# Patient Record
Sex: Female | Born: 1960 | Race: White | Hispanic: No | Smoking: Never smoker
Health system: Southern US, Community
[De-identification: ages and names within clinical notes are randomized; demographics above are authoritative.]

## PROBLEM LIST (undated history)

## (undated) DIAGNOSIS — D689 Coagulation defect, unspecified: Secondary | ICD-10-CM

## (undated) DIAGNOSIS — I82409 Acute embolism and thrombosis of unspecified deep veins of unspecified lower extremity: Secondary | ICD-10-CM

## (undated) DIAGNOSIS — E785 Hyperlipidemia, unspecified: Secondary | ICD-10-CM

## (undated) DIAGNOSIS — I2699 Other pulmonary embolism without acute cor pulmonale: Secondary | ICD-10-CM

## (undated) DIAGNOSIS — K8020 Calculus of gallbladder without cholecystitis without obstruction: Secondary | ICD-10-CM

## (undated) DIAGNOSIS — R569 Unspecified convulsions: Secondary | ICD-10-CM

## (undated) DIAGNOSIS — F32A Depression, unspecified: Secondary | ICD-10-CM

## (undated) DIAGNOSIS — I2510 Atherosclerotic heart disease of native coronary artery without angina pectoris: Secondary | ICD-10-CM

## (undated) DIAGNOSIS — I10 Essential (primary) hypertension: Secondary | ICD-10-CM

## (undated) DIAGNOSIS — T7840XA Allergy, unspecified, initial encounter: Secondary | ICD-10-CM

## (undated) DIAGNOSIS — O2230 Deep phlebothrombosis in pregnancy, unspecified trimester: Secondary | ICD-10-CM

## (undated) DIAGNOSIS — F419 Anxiety disorder, unspecified: Secondary | ICD-10-CM

## (undated) HISTORY — DX: Coagulation defect, unspecified: D68.9

## (undated) HISTORY — DX: Unspecified convulsions: R56.9

## (undated) HISTORY — DX: Allergy, unspecified, initial encounter: T78.40XA

## (undated) HISTORY — DX: Hyperlipidemia, unspecified: E78.5

## (undated) HISTORY — DX: Deep phlebothrombosis in pregnancy, unspecified trimester: O22.30

---

## 1997-12-10 ENCOUNTER — Other Ambulatory Visit: Admission: RE | Admit: 1997-12-10 | Discharge: 1997-12-10 | Payer: Self-pay | Admitting: *Deleted

## 1999-01-01 ENCOUNTER — Other Ambulatory Visit: Admission: RE | Admit: 1999-01-01 | Discharge: 1999-01-01 | Payer: Self-pay | Admitting: *Deleted

## 2001-04-03 ENCOUNTER — Other Ambulatory Visit: Admission: RE | Admit: 2001-04-03 | Discharge: 2001-04-03 | Payer: Self-pay | Admitting: *Deleted

## 2001-05-25 ENCOUNTER — Other Ambulatory Visit: Admission: RE | Admit: 2001-05-25 | Discharge: 2001-05-25 | Payer: Self-pay | Admitting: *Deleted

## 2002-07-18 ENCOUNTER — Ambulatory Visit (HOSPITAL_COMMUNITY): Admission: RE | Admit: 2002-07-18 | Discharge: 2002-07-18 | Payer: Self-pay | Admitting: Neurology

## 2003-04-01 ENCOUNTER — Other Ambulatory Visit: Admission: RE | Admit: 2003-04-01 | Discharge: 2003-04-01 | Payer: Self-pay | Admitting: Obstetrics and Gynecology

## 2003-05-06 ENCOUNTER — Emergency Department (HOSPITAL_COMMUNITY): Admission: EM | Admit: 2003-05-06 | Discharge: 2003-05-07 | Payer: Self-pay | Admitting: Emergency Medicine

## 2004-12-29 ENCOUNTER — Emergency Department (HOSPITAL_COMMUNITY): Admission: EM | Admit: 2004-12-29 | Discharge: 2004-12-29 | Payer: Self-pay | Admitting: Emergency Medicine

## 2005-03-24 ENCOUNTER — Other Ambulatory Visit: Admission: RE | Admit: 2005-03-24 | Discharge: 2005-03-24 | Payer: Self-pay | Admitting: Obstetrics and Gynecology

## 2007-10-06 ENCOUNTER — Ambulatory Visit (HOSPITAL_COMMUNITY): Admission: RE | Admit: 2007-10-06 | Discharge: 2007-10-06 | Payer: Self-pay | Admitting: *Deleted

## 2009-04-07 ENCOUNTER — Ambulatory Visit (HOSPITAL_COMMUNITY): Admission: RE | Admit: 2009-04-07 | Discharge: 2009-04-07 | Payer: Self-pay | Admitting: Family Medicine

## 2009-05-26 ENCOUNTER — Encounter: Admission: RE | Admit: 2009-05-26 | Discharge: 2009-05-26 | Payer: Self-pay | Admitting: Obstetrics and Gynecology

## 2012-10-20 ENCOUNTER — Other Ambulatory Visit (HOSPITAL_COMMUNITY): Payer: Self-pay | Admitting: Family Medicine

## 2012-10-20 ENCOUNTER — Ambulatory Visit (HOSPITAL_COMMUNITY)
Admission: RE | Admit: 2012-10-20 | Discharge: 2012-10-20 | Disposition: A | Discharge status: Home / Self Care | Payer: BC Managed Care – PPO | Source: Ambulatory Visit | Attending: Family Medicine | Admitting: Family Medicine

## 2012-10-20 DIAGNOSIS — R1032 Left lower quadrant pain: Secondary | ICD-10-CM | POA: Insufficient documentation

## 2012-10-20 DIAGNOSIS — R933 Abnormal findings on diagnostic imaging of other parts of digestive tract: Secondary | ICD-10-CM | POA: Insufficient documentation

## 2012-10-20 DIAGNOSIS — K8020 Calculus of gallbladder without cholecystitis without obstruction: Secondary | ICD-10-CM | POA: Insufficient documentation

## 2012-10-20 MED ORDER — IOHEXOL 300 MG/ML  SOLN
100.0000 mL | Freq: Once | INTRAMUSCULAR | Status: AC | PRN
  Administered 2012-10-20: 100 mL via INTRAVENOUS

## 2012-10-20 MED ORDER — IOHEXOL 300 MG/ML  SOLN: 50.0000 mL | Freq: Once | INTRAMUSCULAR | Status: AC | PRN

## 2012-11-06 ENCOUNTER — Ambulatory Visit (INDEPENDENT_AMBULATORY_CARE_PROVIDER_SITE_OTHER): Payer: BC Managed Care – PPO | Admitting: Surgery

## 2012-11-06 ENCOUNTER — Encounter (INDEPENDENT_AMBULATORY_CARE_PROVIDER_SITE_OTHER): Payer: Self-pay | Admitting: Surgery

## 2012-11-06 VITALS — BP 134/86 | HR 83 | Temp 97.4°F | Resp 16 | Ht 64.25 in | Wt 179.8 lb

## 2012-11-06 DIAGNOSIS — K8020 Calculus of gallbladder without cholecystitis without obstruction: Secondary | ICD-10-CM

## 2012-11-06 NOTE — Progress Notes (Signed)
Patient ID: Sarah Chung, female   DOB: 1961/07/08, 52 y.o.   MRN: [id number]  Chief Complaint  Patient presents with  . [place]    eval gallbladder    HPI Sarah Chung is a 52 y.o. female.  Patient sent at the request of Dr. Phillips Odor for left lower quadrant pain. One month ago she had a colonoscopy. No polypectomy was done. CT was done 2 weeks later do to his left lower quadrant pain. Infarcted epiploica was noted with incidental finding of gallstones. Denies any significant right upper quadrant pain nausea or vomiting. She does have some bloating and intermittent dyspepsia. No significant food intolerance. HPI  Past Medical History  Diagnosis Date  . DVT (deep vein thrombosis) in pregnancy   . Seizures     History reviewed. No pertinent past surgical history.  Family History  Problem Relation Age of Onset  . Heart attack Father   . Heart disease Brother     Social History History  Substance Use Topics  . Smoking status: Not on file  . Smokeless tobacco: Not on file  . Alcohol Use: No    Allergies  Allergen Reactions  . Tegretol (Carbamazepine) Hives  . Keppra (Levetiracetam)     Feels outside her body.[name]    Current Outpatient Prescriptions  Medication Sig Dispense Refill  . PHENobarbital (LUMINAL) 64.8 MG tablet       . omeprazole (PRILOSEC) 40 MG capsule        No current facility-administered medications for this visit.    Review of Systems Review of Systems  Constitutional: Negative.   HENT: Negative.   Eyes: Negative.   Respiratory: Negative.   Gastrointestinal: Positive for abdominal pain and abdominal distention.  Endocrine: Negative.   Genitourinary: Negative.   Musculoskeletal: Negative.   Skin: Negative.   Allergic/Immunologic: Negative.   Neurological: Negative.   Hematological: Negative.     Blood pressure 134/86, pulse 83, temperature 97.4 F (36.3 C), temperature source Temporal, resp. rate 16, height 5' 4.25" (1.632 m), weight  179 lb 12.8 oz (81.557 kg), SpO2 98.00%.  Physical Exam Physical Exam  Constitutional: She is oriented to person, place, and time. She appears well-developed and well-nourished.  HENT:  Head: Normocephalic and atraumatic.  Eyes: EOM are normal. Pupils are equal, round, and reactive to light.  Neck: Normal range of motion. Neck supple.  Cardiovascular: Normal rate and regular rhythm.   Pulmonary/Chest: Effort normal and breath sounds normal.  Abdominal: Bowel sounds are normal. She exhibits no distension. There is no tenderness.  Musculoskeletal: Normal range of motion.  Neurological: She is alert and oriented to person, place, and time.  Skin: Skin is warm and dry.  Psychiatric: She has a normal mood and affect. Her behavior is normal. Judgment and thought content normal.    Data Reviewed Clinical Data: Lower quadrant pain. Status post colonoscopy 2  weeks ago. Question perforation.  CT ABDOMEN AND PELVIS WITH CONTRAST  Technique: Multidetector CT imaging of the abdomen and pelvis was  performed following the standard protocol during bolus  administration of intravenous contrast.  Contrast: OMNIPAQUE IOHEXOL 300 MG/ML SOLN  Comparison: None.  Findings: Mild atelectasis is present in the lung bases. There is  no pleural or pericardial effusion.  There is no free intraperitoneal air to suggest bowel perforation.  The gallbladder is filled with high attenuation material most  consistent with contrast material from prior IV administration of  contrast. There are some stones in the gallbladder but no  evidence  of cholecystitis is seen. The liver, spleen, adrenal glands,  pancreas and kidneys appear normal.  The stomach and small bowel appear normal. Infiltration of fat is  seen in the about the distal descending colon without diverticular  disease identified most consistent with appendagitis epiploica.  There is no abscess. The colon is otherwise unremarkable. No   lymphadenopathy or fluid is identified. No focal bony abnormality  is seen.  IMPRESSION:  1. Focal inflammation anterior to the distal descending colon most  consistent with appendagitis epiploica.  2. Gallstones without evidence of cholecystitis.   Assessment    Left lower quadrant pain found to be consistent with probable infarcted epiploica descending colon with resolution of pain  Gallstones without definite signs of colic    Plan    Observation for now. Indications for cholecystectomy include pain with evidence of gallstones. Her symptoms are very minimal at the least at this point in time and observation is recommended unless she becomes more symptomatic. This information about gallstones given to patient.       [name]. 11/06/2012, 3:14 PM

## 2012-11-06 NOTE — Patient Instructions (Signed)
Cholelithiasis Cholelithiasis (also called gallstones) is a form of gallbladder disease where gallstones form in your gallbladder. The gallbladder is a non-essential organ that stores bile made in the liver, which helps digest fats. Gallstones begin as small crystals and slowly grow into stones. Gallstone pain occurs when the gallbladder spasms, and a gallstone is blocking the duct. Pain can also occur when a stone passes out of the duct.  Women are more likely to develop gallstones than men. Other factors that increase the risk of gallbladder disease are:  Having multiple pregnancies. Physicians sometimes advise removing diseased gallbladders before future pregnancies.  Obesity.  Diets heavy in fried foods and fat.  Increasing age (older than 60).  Prolonged use of medications containing female hormones.  Diabetes mellitus.  Rapid weight loss.  Family history of gallstones (heredity). SYMPTOMS  Feeling sick to your stomach (nauseous).  Abdominal pain.  Yellowing of the skin (jaundice).  Sudden pain. It may persist from several minutes to several hours.  Worsening pain with deep breathing or when jarred.  Fever.  Tenderness to the touch. In some cases, when gallstones do not move into the bile duct, people have no pain or symptoms. These are called "silent" gallstones. TREATMENT In severe cases, emergency surgery may be required. HOME CARE INSTRUCTIONS   Only take over-the-counter or prescription medicines for pain, discomfort, or fever as directed by your caregiver.  Follow a low-fat diet until seen again. Fat causes the gallbladder to contract, which can result in pain.  Follow up as instructed. Attacks are almost always recurrent and surgery is usually required for permanent treatment. SEEK IMMEDIATE MEDICAL CARE IF:   Your pain increases and is not controlled by medications.  You have an oral temperature above 102 F (38.9 C), not controlled by medication.  You  develop nausea and vomiting. MAKE SURE YOU:   Understand these instructions.  Will watch your condition.  Will get help right away if you are not doing well or get worse. Document Released: 07/22/2005 Document Revised: 10/18/2011 Document Reviewed: 09/24/2010 [employer] Patient Information 2013 [employer].  

## 2012-12-20 ENCOUNTER — Ambulatory Visit (INDEPENDENT_AMBULATORY_CARE_PROVIDER_SITE_OTHER): Payer: BC Managed Care – PPO | Admitting: Nurse Practitioner

## 2012-12-20 ENCOUNTER — Encounter: Payer: Self-pay | Admitting: Nurse Practitioner

## 2012-12-20 VITALS — BP 132/79 | HR 56 | Ht 64.0 in | Wt 178.0 lb

## 2012-12-20 DIAGNOSIS — Z5181 Encounter for therapeutic drug level monitoring: Secondary | ICD-10-CM | POA: Insufficient documentation

## 2012-12-20 DIAGNOSIS — G40309 Generalized idiopathic epilepsy and epileptic syndromes, not intractable, without status epilepticus: Secondary | ICD-10-CM | POA: Insufficient documentation

## 2012-12-20 LAB — COMPREHENSIVE METABOLIC PANEL
ALT: 16 IU/L (ref 0–32)
Albumin/Globulin Ratio: 1.3 (ref 1.1–2.5)
Albumin: 4.4 g/dL (ref 3.5–5.5)
CO2: 28 mmol/L (ref 19–28)
Calcium: 9.7 mg/dL (ref 8.7–10.2)
Chloride: 102 mmol/L (ref 96–108)
GFR calc non Af Amer: 71 mL/min/{1.73_m2} (ref >59)
Potassium: 4.4 mmol/L (ref 3.5–5.2)
Sodium: 139 mmol/L (ref 134–144)
Total Bilirubin: 0.2 mg/dL (ref 0.0–1.2)

## 2012-12-20 MED ORDER — PHENOBARBITAL 64.8 MG PO TABS: 64.8000 mg | ORAL_TABLET | Freq: Two times a day (BID) | ORAL | Status: DC

## 2012-12-20 NOTE — Progress Notes (Signed)
HPI: Patient returns for followup after her last visit with Dr. Vickey Huger 08/13/2011. She has a history of seizure disorder. Date of last seizure seven years ago.  She has no other medical problems except for arthritis arthralgias. She is here for her refills on her phenobarbital. She denies  staring spells, confusion, sleep disturbances, lapses of time, headache and bowel and bladder incontinence.    ROS:  negative  Physical Exam General: well developed, well nourished, seated, in no evident distress Head: head normocephalic and atraumatic. Oropharynx benign Neck: supple with no carotid or supraclavicular bruits Cardiovascular: regular rate and rhythm, no murmurs  Neurologic Exam Mental Status: Awake and fully alert. Oriented to place and time. Follows all commands speech and language are normal  Cranial Nerves:  Pupils equal, briskly reactive to light. Extraocular movements full without nystagmus. Visual fields full to confrontation. Hearing intact and symmetric to finger snap. Facial sensation intact. Face, tongue, palate move normally and symmetrically. Neck flexion and extension normal.  Motor: Normal bulk and tone. Normal strength in all tested extremity muscles. Sensory.: intact to touch and pinprick and vibratory.  Coordination: Rapid alternating movements normal in all extremities. Finger-to-nose and heel-to-shin performed accurately bilaterally. Gait and Station: Arises from chair without difficulty. Stance is normal. Gait demonstrates normal stride length and balance . Able to heel, toe and tandem walk without difficulty.  Reflexes: 1+ and symmetric. Toes downgoing.     ASSESSMENT: Seizure disorder well controlled on phenobarbital, last bone density 2 years ago was normal     PLAN:  Continue phenobarbital at current dose, will refill Will get labs today Followup in one year and prn     Nilda Riggs, GNP-BC APRN

## 2012-12-20 NOTE — Patient Instructions (Addendum)
Continue phenobarbital at current dose Will get labs today Followup in one year and prn

## 2013-04-17 ENCOUNTER — Telehealth: Payer: Self-pay | Admitting: Neurology

## 2013-04-17 NOTE — Telephone Encounter (Signed)
Patient has questions about libido, sz d/o, and sz med correlation. She has been taking Phenobarbital 64.8 mg bid for years and is currently menopausal. She says OBGYN advised to discuss w/ neurologist.

## 2013-04-17 NOTE — Telephone Encounter (Signed)
This is not a side effect listed that I could find in 2 separate references.

## 2013-04-18 NOTE — Telephone Encounter (Signed)
Spoke to patient. Informed.   

## 2013-05-08 ENCOUNTER — Encounter (INDEPENDENT_AMBULATORY_CARE_PROVIDER_SITE_OTHER): Payer: Self-pay

## 2013-05-30 ENCOUNTER — Ambulatory Visit: Payer: BC Managed Care – PPO | Admitting: Neurology

## 2013-07-01 ENCOUNTER — Observation Stay (HOSPITAL_COMMUNITY)
Admission: EM | Admit: 2013-07-01 | Discharge: 2013-07-04 | Disposition: A | Discharge status: Home / Self Care | Payer: BC Managed Care – PPO | Attending: General Surgery | Admitting: General Surgery

## 2013-07-01 ENCOUNTER — Encounter (HOSPITAL_COMMUNITY): Payer: Self-pay | Admitting: Emergency Medicine

## 2013-07-01 ENCOUNTER — Emergency Department (HOSPITAL_COMMUNITY): Payer: BC Managed Care – PPO

## 2013-07-01 DIAGNOSIS — K8020 Calculus of gallbladder without cholecystitis without obstruction: Secondary | ICD-10-CM

## 2013-07-01 DIAGNOSIS — Z79899 Other long term (current) drug therapy: Secondary | ICD-10-CM | POA: Insufficient documentation

## 2013-07-01 DIAGNOSIS — Z86718 Personal history of other venous thrombosis and embolism: Secondary | ICD-10-CM | POA: Insufficient documentation

## 2013-07-01 DIAGNOSIS — K8010 Calculus of gallbladder with chronic cholecystitis without obstruction: Secondary | ICD-10-CM

## 2013-07-01 DIAGNOSIS — D72829 Elevated white blood cell count, unspecified: Secondary | ICD-10-CM | POA: Insufficient documentation

## 2013-07-01 DIAGNOSIS — R569 Unspecified convulsions: Secondary | ICD-10-CM | POA: Insufficient documentation

## 2013-07-01 DIAGNOSIS — K8000 Calculus of gallbladder with acute cholecystitis without obstruction: Principal | ICD-10-CM

## 2013-07-01 HISTORY — DX: Calculus of gallbladder without cholecystitis without obstruction: K80.20

## 2013-07-01 LAB — URINALYSIS, ROUTINE W REFLEX MICROSCOPIC
Leukocytes, UA: NEGATIVE
Protein, ur: NEGATIVE mg/dL
Specific Gravity, Urine: 1.023 (ref 1.005–1.030)
Urobilinogen, UA: 0.2 mg/dL (ref 0.0–1.0)

## 2013-07-01 LAB — COMPREHENSIVE METABOLIC PANEL
AST: 16 U/L (ref 0–37)
Albumin: 3.8 g/dL (ref 3.5–5.2)
CO2: 27 mEq/L (ref 19–32)
Calcium: 9.2 mg/dL (ref 8.4–10.5)
Chloride: 97 mEq/L (ref 96–112)
GFR calc Af Amer: >90 mL/min (ref >90)
GFR calc non Af Amer: >90 mL/min (ref >90)
Glucose, Bld: 79 mg/dL (ref 70–99)
Sodium: 135 mEq/L (ref 135–145)
Total Protein: 8.5 g/dL — ABNORMAL HIGH (ref 6.0–8.3)

## 2013-07-01 LAB — CBC WITH DIFFERENTIAL/PLATELET
Basophils Relative: 0 % (ref 0–1)
Eosinophils Relative: 1 % (ref 0–5)
MCH: 31.6 pg (ref 26.0–34.0)
MCHC: 33.6 g/dL (ref 30.0–36.0)
Monocytes Absolute: 1 10*3/uL (ref 0.1–1.0)
Monocytes Relative: 7 % (ref 3–12)

## 2013-07-01 MED ORDER — PHENOBARBITAL 32.4 MG PO TABS
64.8000 mg | ORAL_TABLET | Freq: Two times a day (BID) | ORAL | Status: DC
  Administered 2013-07-01 – 2013-07-04 (×7): 64.8 mg via ORAL
  Filled 2013-07-01: qty 1
  Filled 2013-07-01 (×6): qty 2

## 2013-07-01 MED ORDER — DIPHENHYDRAMINE HCL 12.5 MG/5ML PO ELIX: 12.5000 mg | ORAL_SOLUTION | Freq: Four times a day (QID) | ORAL | Status: DC | PRN

## 2013-07-01 MED ORDER — KCL IN DEXTROSE-NACL 20-5-0.45 MEQ/L-%-% IV SOLN
INTRAVENOUS | Status: DC
  Administered 2013-07-01: 20:00:00 via INTRAVENOUS
  Filled 2013-07-01 (×3): qty 1000

## 2013-07-01 MED ORDER — DIPHENHYDRAMINE HCL 50 MG/ML IJ SOLN: 12.5000 mg | Freq: Four times a day (QID) | INTRAMUSCULAR | Status: DC | PRN

## 2013-07-01 MED ORDER — ONDANSETRON HCL 4 MG/2ML IJ SOLN
4.0000 mg | Freq: Once | INTRAMUSCULAR | Status: AC
  Administered 2013-07-01: 4 mg via INTRAVENOUS
  Filled 2013-07-01: qty 2

## 2013-07-01 MED ORDER — MORPHINE SULFATE 4 MG/ML IJ SOLN
4.0000 mg | Freq: Once | INTRAMUSCULAR | Status: AC
  Administered 2013-07-01: 4 mg via INTRAVENOUS
  Filled 2013-07-01: qty 1

## 2013-07-01 MED ORDER — SODIUM CHLORIDE 0.9 % IV SOLN
INTRAVENOUS | Status: AC
  Administered 2013-07-01: 14:00:00 via INTRAVENOUS

## 2013-07-01 MED ORDER — SODIUM CHLORIDE 0.9 % IV SOLN
3.0000 g | Freq: Four times a day (QID) | INTRAVENOUS | Status: DC
  Administered 2013-07-01 – 2013-07-03 (×4): 3 g via INTRAVENOUS
  Filled 2013-07-01 (×8): qty 3

## 2013-07-01 MED ORDER — SODIUM CHLORIDE 0.9 % IV SOLN
3.0000 g | Freq: Once | INTRAVENOUS | Status: AC
  Administered 2013-07-01: 3 g via INTRAVENOUS
  Filled 2013-07-01: qty 3

## 2013-07-01 MED ORDER — HEPARIN SODIUM (PORCINE) 5000 UNIT/ML IJ SOLN
5000.0000 [IU] | Freq: Once | INTRAMUSCULAR | Status: DC
  Filled 2013-07-01: qty 1

## 2013-07-01 MED ORDER — HYDROMORPHONE HCL PF 1 MG/ML IJ SOLN
1.0000 mg | INTRAMUSCULAR | Status: AC | PRN
  Administered 2013-07-01 – 2013-07-02 (×3): 1 mg via INTRAVENOUS
  Filled 2013-07-01 (×3): qty 1

## 2013-07-01 MED ORDER — HYDROMORPHONE HCL PF 1 MG/ML IJ SOLN
1.0000 mg | INTRAMUSCULAR | Status: DC | PRN
  Administered 2013-07-01: 1 mg via INTRAVENOUS
  Filled 2013-07-01: qty 1

## 2013-07-01 MED ORDER — HYDROMORPHONE HCL PF 1 MG/ML IJ SOLN
1.0000 mg | Freq: Once | INTRAMUSCULAR | Status: AC
  Administered 2013-07-01: 1 mg via INTRAVENOUS
  Filled 2013-07-01: qty 1

## 2013-07-01 MED ORDER — ACETAMINOPHEN 325 MG PO TABS
650.0000 mg | ORAL_TABLET | Freq: Four times a day (QID) | ORAL | Status: DC | PRN
  Administered 2013-07-01 – 2013-07-03 (×2): 650 mg via ORAL
  Filled 2013-07-01 (×2): qty 2

## 2013-07-01 MED ORDER — ONDANSETRON HCL 4 MG/2ML IJ SOLN: 4.0000 mg | Freq: Three times a day (TID) | INTRAMUSCULAR | Status: AC | PRN

## 2013-07-01 MED ORDER — SODIUM CHLORIDE 0.9 % IV BOLUS (SEPSIS)
1000.0000 mL | Freq: Once | INTRAVENOUS | Status: AC
  Administered 2013-07-01: 1000 mL via INTRAVENOUS

## 2013-07-01 MED ORDER — ACETAMINOPHEN 650 MG RE SUPP: 650.0000 mg | Freq: Four times a day (QID) | RECTAL | Status: DC | PRN

## 2013-07-01 NOTE — ED Notes (Signed)
Pt began having upper abdominal pain last night and it  Has become sever and spread all across all 4 quads

## 2013-07-01 NOTE — Anesthesia Preprocedure Evaluation (Addendum)
Anesthesia Evaluation  Patient identified by MRN, date of birth, ID band Patient awake    Reviewed: Allergy & Precautions, H&P , NPO status , Patient's Chart, lab work & pertinent test results  Airway Mallampati: II TM Distance: >3 FB Neck ROM: full    Dental no notable dental hx. (+) Teeth Intact and Dental Advisory Given   Pulmonary neg pulmonary ROS,  breath sounds clear to auscultation  Pulmonary exam normal       Cardiovascular Exercise Tolerance: Good negative cardio ROS  Rhythm:regular Rate:Normal     Neuro/Psych Seizures -,  Last seizure 8 years ago negative psych ROS   GI/Hepatic negative GI ROS, Neg liver ROS,   Endo/Other  negative endocrine ROS  Renal/GU negative Renal ROS  negative genitourinary   Musculoskeletal   Abdominal   Peds  Hematology negative hematology ROS (+)   Anesthesia Other Findings   Reproductive/Obstetrics negative OB ROS                          Anesthesia Physical Anesthesia Plan  ASA: II  Anesthesia Plan: General   Post-op Pain Management:    Induction: Intravenous  Airway Management Planned: Oral ETT  Additional Equipment:   Intra-op Plan:   Post-operative Plan: Extubation in OR  Informed Consent: I have reviewed the patients History and Physical, chart, labs and discussed the procedure including the risks, benefits and alternatives for the proposed anesthesia with the patient or authorized representative who has indicated his/her understanding and acceptance.   Dental Advisory Given  Plan Discussed with: CRNA and Surgeon  Anesthesia Plan Comments:        Anesthesia Quick Evaluation

## 2013-07-01 NOTE — H&P (Signed)
Sarah Chung is an 52 y.o. female.   Chief Complaint: abdominal pain HPI:  Pt is a 52 yo F with around 20 hours of abdominal pain.  She was having birthday party celebration last night, and she "overdid it a little with the food."  She does not drink alcohol because of history of seizures.  She started having what she thought was heartburn.  It was located in the epigastric region.  It steadily got worse.  She tried several doses of mylanta which did not help.  She was able to sleep a little, but when she woke up she still had severe pain, but now was like a band across her upper abdomen.  She also felt bloated.  She was a bit queasy, but did not throw up.  She then tried gas x, but did not have relief.  She came to ED today for continued symptoms.    Past Medical History  Diagnosis Date  . DVT (deep vein thrombosis) in pregnancy   . Seizures   . Gallstones     History reviewed. No pertinent past surgical history.  Family History  Problem Relation Age of Onset  . Heart attack Father   . Heart disease Brother    Social History:  reports that she has never smoked. She has never used smokeless tobacco. She reports that she does not drink alcohol or use illicit drugs.  Allergies:  Allergies  Allergen Reactions  . Tegretol [Carbamazepine] Hives  . Keppra [Levetiracetam]     Feels outside her body.[name]    Medications Prior to Admission  Medication Sig Dispense Refill  . aluminum-magnesium hydroxide-simethicone (MAALOX) 200-200-20 MG/5ML SUSP Take 30 mLs by mouth 3 (three) times daily as needed. Heart burn      . PHENobarbital (LUMINAL) 64.8 MG tablet Take 64.8 mg by mouth 2 (two) times daily.      . phentermine 37.5 MG capsule Take 37.5 mg by mouth every morning.      . prednisoLONE 5 MG TABS tablet Take 5 mg by mouth daily. Pt is doing a taper dose days 1-4 2 tablets 2 tablet before breakfast and bedtime then 1 tablet at lunch, supper, days 5-8 1 tablet at breakfast, lunch, dinner, and  bedtime, days 9-12 1 tablet at breakfast and 1 tablet at bedtime.  Pt is on day day 4      . simethicone (MYLICON) 80 MG chewable tablet Chew 240 mg by mouth once.        Results for orders placed during the hospital encounter of 07/01/13 (from the past 48 hour(s))  CBC WITH DIFFERENTIAL     Status: Abnormal   Collection Time    07/01/13 11:38 AM      Result Value Range   WBC 13.6 (*) 4.0 - 10.5 K/uL   RBC 4.12  3.87 - 5.11 MIL/uL   Hemoglobin 13.0  12.0 - 15.0 g/dL   HCT 56.4  33.2 - 95.1 %   MCV 93.9  78.0 - 100.0 fL   MCH 31.6  26.0 - 34.0 pg   MCHC 33.6  30.0 - 36.0 g/dL   RDW 88.4  16.6 - 06.3 %   Platelets 263  150 - 400 K/uL   Neutrophils Relative % 68  43 - 77 %   Neutro Abs 9.3 (*) 1.7 - 7.7 K/uL   Lymphocytes Relative 24  12 - 46 %   Lymphs Abs 3.2  0.7 - 4.0 K/uL   Monocytes Relative 7  3 -  12 %   Monocytes Absolute 1.0  0.1 - 1.0 K/uL   Eosinophils Relative 1  0 - 5 %   Eosinophils Absolute 0.1  0.0 - 0.7 K/uL   Basophils Relative 0  0 - 1 %   Basophils Absolute 0.0  0.0 - 0.1 K/uL  COMPREHENSIVE METABOLIC PANEL     Status: Abnormal   Collection Time    07/01/13 11:38 AM      Result Value Range   Sodium 135  135 - 145 mEq/L   Potassium 4.1  3.5 - 5.1 mEq/L   Chloride 97  96 - 112 mEq/L   CO2 27  19 - 32 mEq/L   Glucose, Bld 79  70 - 99 mg/dL   BUN 18  6 - 23 mg/dL   Creatinine, Ser 1.61  0.50 - 1.10 mg/dL   Calcium 9.2  8.4 - 09.6 mg/dL   Total Protein 8.5 (*) 6.0 - 8.3 g/dL   Albumin 3.8  3.5 - 5.2 g/dL   AST 16  0 - 37 U/L   ALT 25  0 - 35 U/L   Alkaline Phosphatase 112  39 - 117 U/L   Total Bilirubin 0.1 (*) 0.3 - 1.2 mg/dL   GFR calc non Af Amer >90  >90 mL/min   GFR calc Af Amer >90  >90 mL/min   Comment: (NOTE)     The eGFR has been calculated using the CKD EPI equation.     This calculation has not been validated in all clinical situations.     eGFR's persistently <90 mL/min signify possible Chronic Kidney     Disease.  LIPASE, BLOOD     Status:  None   Collection Time    07/01/13 11:38 AM      Result Value Range   Lipase 28  11 - 59 U/L  TROPONIN I     Status: None   Collection Time    07/01/13 11:38 AM      Result Value Range   Troponin I <0.30  <0.30 ng/mL   Comment:            Due to the release kinetics of cTnI,     a negative result within the first hours     of the onset of symptoms does not rule out     myocardial infarction with certainty.     If myocardial infarction is still suspected,     repeat the test at appropriate intervals.  URINALYSIS, ROUTINE W REFLEX MICROSCOPIC     Status: None   Collection Time    07/01/13  1:07 PM      Result Value Range   Color, Urine YELLOW  YELLOW   APPearance CLEAR  CLEAR   Specific Gravity, Urine 1.023  1.005 - 1.030   pH 6.5  5.0 - 8.0   Glucose, UA NEGATIVE  NEGATIVE mg/dL   Hgb urine dipstick NEGATIVE  NEGATIVE   Bilirubin Urine NEGATIVE  NEGATIVE   Ketones, ur NEGATIVE  NEGATIVE mg/dL   Protein, ur NEGATIVE  NEGATIVE mg/dL   Urobilinogen, UA 0.2  0.0 - 1.0 mg/dL   Nitrite NEGATIVE  NEGATIVE   Leukocytes, UA NEGATIVE  NEGATIVE   Comment: MICROSCOPIC NOT DONE ON URINES WITH NEGATIVE PROTEIN, BLOOD, LEUKOCYTES, NITRITE, OR GLUCOSE <1000 mg/dL.   US Abdomen Complete  07/01/2013   CLINICAL DATA:  Right upper quadrant pain  EXAM: ULTRASOUND ABDOMEN COMPLETE  COMPARISON:  Correlated with CT of abdomen pelvis 10/20/2012  FINDINGS: Gallbladder  A large gallstone is appreciated. Within the gallbladder measuring 3.23 cm in diameter. The gallstone is lodged in the region of the neck of the gallbladder. The gallbladder wall thickness is 2.8 mm. There is no evidence of a sonographic Murphy's sign no pericholecystic fluid.  Common bile duct  Diameter: 6.7 mm  Liver  Heterogeneous slightly echogenic architecture.  IVC  No abnormality visualized.  Pancreas  Visualized portion unremarkable.  Spleen  Size and appearance within normal limits.  Right Kidney  Length: 10.4 cm . Echogenicity  within normal limits. No mass or hydronephrosis visualized.  Left Kidney  Length: 10.7 cm. Echogenicity within normal limits. No mass or hydronephrosis visualized.  Abdominal aorta  No aneurysm visualized.  IMPRESSION: Large nonmobile gallstone within the gallbladder. No evidence of cholecystitis.   Electronically Signed   By: Salome Holmes M.D.   On: 07/01/2013 13:07    Review of Systems  Gastrointestinal: Positive for heartburn, nausea, vomiting and abdominal pain.  All other systems reviewed and are negative.    Blood pressure 131/81, pulse 54, temperature 98.2 F (36.8 C), temperature source Oral, resp. rate 18, height 5\' 4"  (1.626 m), weight 180 lb (81.647 kg), SpO2 99.00%. Physical Exam  Constitutional: She is oriented to person, place, and time. She appears well-developed and well-nourished. No distress.  HENT:  Head: Normocephalic and atraumatic.  Eyes: Conjunctivae are normal. Pupils are equal, round, and reactive to light. No scleral icterus.  Neck: Normal range of motion. Neck supple. No thyromegaly present.  Cardiovascular: Normal rate, regular rhythm, normal heart sounds and intact distal pulses.  Exam reveals no gallop and no friction rub.   No murmur heard. Respiratory: Effort normal and breath sounds normal. No respiratory distress. She has no wheezes. She has no rales. She exhibits no tenderness.  GI: Soft. Bowel sounds are normal. She exhibits no distension. There is tenderness (RUQ). There is rebound. There is no guarding.  Musculoskeletal: Normal range of motion. She exhibits no edema and no tenderness.  Neurological: She is alert and oriented to person, place, and time.  Skin: Skin is warm and dry. No rash noted. She is not diaphoretic. No erythema. No pallor.  Psychiatric: She has a normal mood and affect. Her behavior is normal. Judgment and thought content normal.     Assessment/Plan Acute cholecystitis IV Fluids IV antibiotics NPO after midnight. Will give  one dose heparin tonight for dvt prophylaxis.  Clears until midnight. Pain control.    The surgical procedure was described to the patient in detail.   I discussed the incision type and location, the location of the gallbladder, the anatomy of the bile ducts and arteries, and the typical progression of surgery.  I discussed the possibility of converting to an open operation.  I advised of the risks of bleeding, infection, damage to other structures (such as the bile duct, intestine or liver), bile leak, need for other procedures or surgeries, and post op diarrhea/constipation.  We discussed the risk of blood clot.  We discussed the recovery period and post operative restrictions.     Sarah Chung 07/01/2013, 5:56 PM

## 2013-07-01 NOTE — ED Provider Notes (Signed)
CSN: [id number]     Arrival date & time 07/01/13  1045 History   First MD Initiated Contact with Patient 07/01/13 1053     Chief Complaint  Patient presents with  . Abdominal Pain   (Consider location/radiation/quality/duration/timing/severity/associated sxs/prior Treatment) HPI Comments: Patient presents with severe epigastric pain onset around 11 PM last night. It has been constant and nothing makes it better nothing makes it worse. It radiates to her entire abdomen. Denies any chest pain or back pain. No shortness of breath, cough or fever. Some nausea but no vomiting. Maalox did not help the pain. No change in bowel habits, no urinary symptoms. She has a known history of gallstones but has never had this severe pain before.  The history is provided by the patient.    Past Medical History  Diagnosis Date  . DVT (deep vein thrombosis) in pregnancy   . Seizures   . Gallstones    History reviewed. No pertinent past surgical history. Family History  Problem Relation Age of Onset  . Heart attack Father   . Heart disease Brother    History  Substance Use Topics  . Smoking status: Never Smoker   . Smokeless tobacco: Never Used  . Alcohol Use: No   OB History   Grav Para Term Preterm Abortions TAB SAB Ect Mult Living                 Review of Systems  Constitutional: Positive for activity change and appetite change. Negative for fever.  Respiratory: Negative for cough and chest tightness.   Cardiovascular: Negative for chest pain.  Gastrointestinal: Positive for nausea and abdominal pain. Negative for vomiting.  Genitourinary: Negative for dysuria, hematuria, vaginal bleeding and vaginal discharge.  Musculoskeletal: Negative for arthralgias, back pain and myalgias.  Skin: Negative for rash.  Neurological: Negative for dizziness, weakness and headaches.  A complete 10 system review of systems was obtained and all systems are negative except as noted in the HPI and PMH.     Allergies  Tegretol and Keppra  Home Medications   Current Outpatient Rx  Name  Route  Sig  Dispense  Refill  . aluminum-magnesium hydroxide-simethicone (MAALOX) 200-200-20 MG/5ML SUSP   Oral   Take 30 mLs by mouth 3 (three) times daily as needed. Heart burn         . PHENobarbital (LUMINAL) 64.8 MG tablet   Oral   Take 64.8 mg by mouth 2 (two) times daily.         . phentermine 37.5 MG capsule   Oral   Take 37.5 mg by mouth every morning.         . prednisoLONE 5 MG TABS tablet   Oral   Take 5 mg by mouth daily. Pt is doing a taper dose days 1-4 2 tablets 2 tablet before breakfast and bedtime then 1 tablet at lunch, supper, days 5-8 1 tablet at breakfast, lunch, dinner, and bedtime, days 9-12 1 tablet at breakfast and 1 tablet at bedtime.  Pt is on day day 4         . simethicone (MYLICON) 80 MG chewable tablet   Oral   Chew 240 mg by mouth once.          BP 158/74  Pulse 60  Temp(Src) 97.4 F (36.3 C) (Oral)  Resp 18  SpO2 100% Physical Exam  Constitutional: She is oriented to person, place, and time. She appears well-developed and well-nourished. No distress.  HENT:  Head: Normocephalic  and atraumatic.  Mouth/Throat: Oropharynx is clear and moist. No oropharyngeal exudate.  Eyes: Conjunctivae and EOM are normal. Pupils are equal, round, and reactive to light.  Neck: Normal range of motion. Neck supple.  Cardiovascular: Normal rate, regular rhythm and normal heart sounds.   No murmur heard. Pulmonary/Chest: Effort normal and breath sounds normal. No respiratory distress.  Abdominal: Soft. There is tenderness. There is guarding.  TTP RUQ and epigastric area with guarding. +Murphy's sign.  Musculoskeletal: Normal range of motion. She exhibits no edema and no tenderness.  No CVAT  Neurological: She is alert and oriented to person, place, and time. No cranial nerve deficit. She exhibits normal muscle tone. Coordination normal.  Skin: Skin is warm.     ED Course  Procedures (including critical care time) Labs Review Labs Reviewed  CBC WITH DIFFERENTIAL - Abnormal; Notable for the following:    WBC 13.6 (*)    Neutro Abs 9.3 (*)    All other components within normal limits  COMPREHENSIVE METABOLIC PANEL - Abnormal; Notable for the following:    Total Protein 8.5 (*)    Total Bilirubin 0.1 (*)    All other components within normal limits  LIPASE, BLOOD  URINALYSIS, ROUTINE W REFLEX MICROSCOPIC  TROPONIN I   Imaging Review US Abdomen Complete  07/01/2013   CLINICAL DATA:  Right upper quadrant pain  EXAM: ULTRASOUND ABDOMEN COMPLETE  COMPARISON:  Correlated with CT of abdomen pelvis 10/20/2012  FINDINGS: Gallbladder  A large gallstone is appreciated. Within the gallbladder measuring 3.23 cm in diameter. The gallstone is lodged in the region of the neck of the gallbladder. The gallbladder wall thickness is 2.8 mm. There is no evidence of a sonographic Murphy's sign no pericholecystic fluid.  Common bile duct  Diameter: 6.7 mm  Liver  Heterogeneous slightly echogenic architecture.  IVC  No abnormality visualized.  Pancreas  Visualized portion unremarkable.  Spleen  Size and appearance within normal limits.  Right Kidney  Length: 10.4 cm . Echogenicity within normal limits. No mass or hydronephrosis visualized.  Left Kidney  Length: 10.7 cm. Echogenicity within normal limits. No mass or hydronephrosis visualized.  Abdominal aorta  No aneurysm visualized.  IMPRESSION: Large nonmobile gallstone within the gallbladder. No evidence of cholecystitis.   Electronically Signed   By: Salome Holmes M.D.   On: 07/01/2013 13:07    EKG Interpretation    Date/Time:  Sunday July 01 2013 11:00:08 EST Ventricular Rate:  61 PR Interval:  165 QRS Duration: 85 QT Interval:  440 QTC Calculation: 443 R Axis:   53 Text Interpretation:  Sinus rhythm Ventricular premature complex Left atrial enlargement Baseline wander in lead(s) V1 V4 No previous ECGs  available Confirmed by Manus Gunning  MD, Deliyah Muckle ([id number]) on 07/01/2013 11:12:57 AM            MDM   1. Cholelithiasis    RUQ pain with nausea.  Known gallstones. +Murphy's sign.  Concern for cholecystitis.   NPO, Labs, [place] RUQ.  Mild leukocytosis. LFTs and lipase normal. Ultrasound shows large gallstone with thickened gallbladder wall. No pericholecystic fluid  Discussed with Dr. Donell Beers. Agrees to admission for symptomatic cholelithiasis. Recommends IV Unasyn, clear liquids.   Glynn Octave, MD 07/01/13 [phone]

## 2013-07-01 NOTE — Progress Notes (Signed)
Dr Donell Beers notified of patients admission

## 2013-07-02 ENCOUNTER — Inpatient Hospital Stay (HOSPITAL_COMMUNITY): Payer: BC Managed Care – PPO | Admitting: Anesthesiology

## 2013-07-02 ENCOUNTER — Encounter (HOSPITAL_COMMUNITY)
Admission: EM | Disposition: A | Discharge status: Home / Self Care | Payer: Self-pay | Source: Home / Self Care | Attending: Emergency Medicine

## 2013-07-02 ENCOUNTER — Encounter (HOSPITAL_COMMUNITY): Payer: BC Managed Care – PPO | Admitting: Anesthesiology

## 2013-07-02 ENCOUNTER — Encounter (HOSPITAL_COMMUNITY): Payer: Self-pay

## 2013-07-02 ENCOUNTER — Inpatient Hospital Stay (HOSPITAL_COMMUNITY): Payer: BC Managed Care – PPO

## 2013-07-02 HISTORY — PX: CHOLECYSTECTOMY: SHX55

## 2013-07-02 LAB — COMPREHENSIVE METABOLIC PANEL
AST: 12 U/L (ref 0–37)
BUN: 14 mg/dL (ref 6–23)
CO2: 27 mEq/L (ref 19–32)
Calcium: 8.5 mg/dL (ref 8.4–10.5)
Chloride: 103 mEq/L (ref 96–112)
Creatinine, Ser: 0.89 mg/dL (ref 0.50–1.10)
GFR calc non Af Amer: 72 mL/min — ABNORMAL LOW (ref >90)
Potassium: 4.2 mEq/L (ref 3.5–5.1)
Total Bilirubin: 0.3 mg/dL (ref 0.3–1.2)

## 2013-07-02 LAB — CBC
MCHC: 32.7 g/dL (ref 30.0–36.0)
Platelets: 225 10*3/uL (ref 150–400)
RDW: 13.4 % (ref 11.5–15.5)
WBC: 8.4 10*3/uL (ref 4.0–10.5)

## 2013-07-02 LAB — PHENOBARBITAL LEVEL: Phenobarbital: 10.3 ug/mL — ABNORMAL LOW (ref 15.0–40.0)

## 2013-07-02 LAB — MAGNESIUM: Magnesium: 2.2 mg/dL (ref 1.5–2.5)

## 2013-07-02 LAB — SURGICAL PCR SCREEN
MRSA, PCR: NEGATIVE
Staphylococcus aureus: NEGATIVE

## 2013-07-02 LAB — PHOSPHORUS: Phosphorus: 4.1 mg/dL (ref 2.3–4.6)

## 2013-07-02 SURGERY — LAPAROSCOPIC CHOLECYSTECTOMY WITH INTRAOPERATIVE CHOLANGIOGRAM
Anesthesia: General | Site: Abdomen | Wound class: Contaminated

## 2013-07-02 MED ORDER — MIDAZOLAM HCL 2 MG/2ML IJ SOLN
INTRAMUSCULAR | Status: AC
  Filled 2013-07-02: qty 2

## 2013-07-02 MED ORDER — IOHEXOL 300 MG/ML  SOLN
INTRAMUSCULAR | Status: DC | PRN
  Administered 2013-07-02: 7 mL via INTRAVENOUS

## 2013-07-02 MED ORDER — ENOXAPARIN SODIUM 40 MG/0.4ML ~~LOC~~ SOLN
40.0000 mg | SUBCUTANEOUS | Status: DC
  Administered 2013-07-03 – 2013-07-04 (×2): 40 mg via SUBCUTANEOUS
  Filled 2013-07-02 (×3): qty 0.4

## 2013-07-02 MED ORDER — SODIUM CHLORIDE 0.9 % IV SOLN
3.0000 g | Freq: Four times a day (QID) | INTRAVENOUS | Status: DC
  Administered 2013-07-02 – 2013-07-04 (×7): 3 g via INTRAVENOUS
  Filled 2013-07-02 (×10): qty 3

## 2013-07-02 MED ORDER — SODIUM CHLORIDE 0.9 % IR SOLN
Status: DC | PRN
  Administered 2013-07-02: 3000 mL

## 2013-07-02 MED ORDER — GLYCOPYRROLATE 0.2 MG/ML IJ SOLN
INTRAMUSCULAR | Status: DC | PRN
  Administered 2013-07-02: 0.6 mg via INTRAVENOUS
  Administered 2013-07-02: 0.2 mg via INTRAVENOUS

## 2013-07-02 MED ORDER — PROPOFOL 10 MG/ML IV BOLUS
INTRAVENOUS | Status: DC | PRN
  Administered 2013-07-02: 150 mg via INTRAVENOUS

## 2013-07-02 MED ORDER — LACTATED RINGERS IV SOLN: INTRAVENOUS | Status: DC

## 2013-07-02 MED ORDER — POTASSIUM CHLORIDE IN NACL 20-0.9 MEQ/L-% IV SOLN
INTRAVENOUS | Status: DC
  Administered 2013-07-02 – 2013-07-04 (×5): via INTRAVENOUS
  Filled 2013-07-02 (×5): qty 1000

## 2013-07-02 MED ORDER — BUPIVACAINE-EPINEPHRINE PF 0.5-1:200000 % IJ SOLN
INTRAMUSCULAR | Status: AC
  Filled 2013-07-02: qty 30

## 2013-07-02 MED ORDER — OXYCODONE-ACETAMINOPHEN 5-325 MG PO TABS
1.0000 | ORAL_TABLET | ORAL | Status: DC | PRN
  Administered 2013-07-02 – 2013-07-03 (×4): 2 via ORAL
  Administered 2013-07-03 (×2): 1 via ORAL
  Administered 2013-07-04 (×2): 2 via ORAL
  Filled 2013-07-02: qty 2
  Filled 2013-07-02: qty 1
  Filled 2013-07-02 (×2): qty 2
  Filled 2013-07-02: qty 1
  Filled 2013-07-02 (×3): qty 2

## 2013-07-02 MED ORDER — METOCLOPRAMIDE HCL 5 MG/ML IJ SOLN
INTRAMUSCULAR | Status: DC | PRN
  Administered 2013-07-02: 10 mg via INTRAVENOUS

## 2013-07-02 MED ORDER — DEXAMETHASONE SODIUM PHOSPHATE 10 MG/ML IJ SOLN
INTRAMUSCULAR | Status: AC
  Filled 2013-07-02: qty 1

## 2013-07-02 MED ORDER — LIDOCAINE HCL (CARDIAC) 20 MG/ML IV SOLN
INTRAVENOUS | Status: AC
  Filled 2013-07-02: qty 5

## 2013-07-02 MED ORDER — ONDANSETRON HCL 4 MG/2ML IJ SOLN: 4.0000 mg | Freq: Four times a day (QID) | INTRAMUSCULAR | Status: DC | PRN

## 2013-07-02 MED ORDER — LIDOCAINE HCL (CARDIAC) 20 MG/ML IV SOLN
INTRAVENOUS | Status: DC | PRN
  Administered 2013-07-02: 100 mg via INTRAVENOUS

## 2013-07-02 MED ORDER — SUCCINYLCHOLINE CHLORIDE 20 MG/ML IJ SOLN
INTRAMUSCULAR | Status: DC | PRN
  Administered 2013-07-02: 100 mg via INTRAVENOUS

## 2013-07-02 MED ORDER — SUCCINYLCHOLINE CHLORIDE 20 MG/ML IJ SOLN
INTRAMUSCULAR | Status: AC
  Filled 2013-07-02: qty 1

## 2013-07-02 MED ORDER — ESMOLOL HCL 10 MG/ML IV SOLN
INTRAVENOUS | Status: DC | PRN
  Administered 2013-07-02: 20 mg via INTRAVENOUS
  Administered 2013-07-02: 10 mg via INTRAVENOUS
  Administered 2013-07-02: 20 mg via INTRAVENOUS

## 2013-07-02 MED ORDER — SODIUM CHLORIDE 0.9 % IV SOLN
INTRAVENOUS | Status: AC
  Filled 2013-07-02: qty 3

## 2013-07-02 MED ORDER — DEXAMETHASONE SODIUM PHOSPHATE 10 MG/ML IJ SOLN
INTRAMUSCULAR | Status: DC | PRN
  Administered 2013-07-02: 10 mg via INTRAVENOUS

## 2013-07-02 MED ORDER — HYDROMORPHONE HCL PF 1 MG/ML IJ SOLN
INTRAMUSCULAR | Status: AC
  Filled 2013-07-02: qty 1

## 2013-07-02 MED ORDER — POTASSIUM CHLORIDE IN NACL 20-0.9 MEQ/L-% IV SOLN
INTRAVENOUS | Status: AC
  Filled 2013-07-02: qty 1000

## 2013-07-02 MED ORDER — GLYCOPYRROLATE 0.2 MG/ML IJ SOLN
INTRAMUSCULAR | Status: AC
  Filled 2013-07-02: qty 1

## 2013-07-02 MED ORDER — ONDANSETRON HCL 4 MG PO TABS: 4.0000 mg | ORAL_TABLET | Freq: Four times a day (QID) | ORAL | Status: DC | PRN

## 2013-07-02 MED ORDER — NEOSTIGMINE METHYLSULFATE 1 MG/ML IJ SOLN
INTRAMUSCULAR | Status: AC
  Filled 2013-07-02: qty 10

## 2013-07-02 MED ORDER — ONDANSETRON HCL 4 MG/2ML IJ SOLN
INTRAMUSCULAR | Status: DC | PRN
  Administered 2013-07-02: 4 mg via INTRAVENOUS

## 2013-07-02 MED ORDER — FENTANYL CITRATE 0.05 MG/ML IJ SOLN
INTRAMUSCULAR | Status: DC | PRN
  Administered 2013-07-02 (×5): 50 ug via INTRAVENOUS

## 2013-07-02 MED ORDER — ROCURONIUM BROMIDE 100 MG/10ML IV SOLN
INTRAVENOUS | Status: AC
  Filled 2013-07-02: qty 1

## 2013-07-02 MED ORDER — LACTATED RINGERS IV SOLN
INTRAVENOUS | Status: DC
  Administered 2013-07-02: 1000 mL via INTRAVENOUS

## 2013-07-02 MED ORDER — ROCURONIUM BROMIDE 100 MG/10ML IV SOLN
INTRAVENOUS | Status: DC | PRN
  Administered 2013-07-02: 10 mg via INTRAVENOUS
  Administered 2013-07-02: 5 mg via INTRAVENOUS
  Administered 2013-07-02: 35 mg via INTRAVENOUS
  Administered 2013-07-02: 5 mg via INTRAVENOUS

## 2013-07-02 MED ORDER — HYDROMORPHONE HCL PF 1 MG/ML IJ SOLN
0.2500 mg | INTRAMUSCULAR | Status: DC | PRN
  Administered 2013-07-02 (×2): 0.5 mg via INTRAVENOUS

## 2013-07-02 MED ORDER — MIDAZOLAM HCL 5 MG/5ML IJ SOLN
INTRAMUSCULAR | Status: DC | PRN
  Administered 2013-07-02: 2 mg via INTRAVENOUS

## 2013-07-02 MED ORDER — NEOSTIGMINE METHYLSULFATE 1 MG/ML IJ SOLN
INTRAMUSCULAR | Status: DC | PRN
  Administered 2013-07-02: 5 mg via INTRAVENOUS

## 2013-07-02 MED ORDER — ESMOLOL HCL 10 MG/ML IV SOLN
INTRAVENOUS | Status: AC
  Filled 2013-07-02: qty 10

## 2013-07-02 MED ORDER — ONDANSETRON HCL 4 MG/2ML IJ SOLN
INTRAMUSCULAR | Status: AC
  Filled 2013-07-02: qty 2

## 2013-07-02 MED ORDER — PROPOFOL 10 MG/ML IV BOLUS
INTRAVENOUS | Status: AC
  Filled 2013-07-02: qty 20

## 2013-07-02 MED ORDER — BUPIVACAINE-EPINEPHRINE 0.5% -1:200000 IJ SOLN
INTRAMUSCULAR | Status: DC | PRN
  Administered 2013-07-02: 20 mL

## 2013-07-02 MED ORDER — FENTANYL CITRATE 0.05 MG/ML IJ SOLN
25.0000 ug | INTRAMUSCULAR | Status: DC | PRN
  Administered 2013-07-02 – 2013-07-03 (×4): 25 ug via INTRAVENOUS
  Filled 2013-07-02 (×4): qty 2

## 2013-07-02 MED ORDER — FENTANYL CITRATE 0.05 MG/ML IJ SOLN
INTRAMUSCULAR | Status: AC
  Filled 2013-07-02: qty 5

## 2013-07-02 MED ORDER — METOCLOPRAMIDE HCL 5 MG/ML IJ SOLN
INTRAMUSCULAR | Status: AC
  Filled 2013-07-02: qty 2

## 2013-07-02 SURGICAL SUPPLY — 36 items
ADH SKN CLS APL DERMABOND .7 (GAUZE/BANDAGES/DRESSINGS) ×1
APL SKNCLS STERI-STRIP NONHPOA (GAUZE/BANDAGES/DRESSINGS) ×1
APPLIER CLIP ROT 10 11.4 M/L (STAPLE) ×2
APR CLP MED LRG 11.4X10 (STAPLE) ×1
BAG SPEC RTRVL LRG 6X4 10 (ENDOMECHANICALS)
BENZOIN TINCTURE PRP APPL 2/3 (GAUZE/BANDAGES/DRESSINGS) ×2 IMPLANT
CANISTER SUCTION 2500CC (MISCELLANEOUS) ×2 IMPLANT
CLIP APPLIE ROT 10 11.4 M/L (STAPLE) ×1 IMPLANT
COVER MAYO STAND STRL (DRAPES) ×2 IMPLANT
DECANTER SPIKE VIAL GLASS SM (MISCELLANEOUS) ×2 IMPLANT
DERMABOND ADVANCED (GAUZE/BANDAGES/DRESSINGS) ×1
DERMABOND ADVANCED .7 DNX12 (GAUZE/BANDAGES/DRESSINGS) ×1 IMPLANT
DRAPE C-ARM 42X120 X-RAY (DRAPES) ×2 IMPLANT
DRAPE LAPAROSCOPIC ABDOMINAL (DRAPES) ×2 IMPLANT
DRAPE UTILITY XL STRL (DRAPES) ×2 IMPLANT
ELECT REM PT RETURN 9FT ADLT (ELECTROSURGICAL) ×2
ELECTRODE REM PT RTRN 9FT ADLT (ELECTROSURGICAL) ×1 IMPLANT
GLOVE EUDERMIC 7 POWDERFREE (GLOVE) ×2 IMPLANT
GOWN STRL REIN XL XLG (GOWN DISPOSABLE) ×8 IMPLANT
HEMOSTAT SNOW SURGICEL 2X4 (HEMOSTASIS) IMPLANT
KIT BASIN OR (CUSTOM PROCEDURE TRAY) ×2 IMPLANT
POUCH SPECIMEN RETRIEVAL 10MM (ENDOMECHANICALS) IMPLANT
SCISSORS LAP 5X35 DISP (ENDOMECHANICALS) ×1 IMPLANT
SET CHOLANGIOGRAPH MIX (MISCELLANEOUS) ×2 IMPLANT
SET IRRIG TUBING LAPAROSCOPIC (IRRIGATION / IRRIGATOR) ×2 IMPLANT
SLEEVE XCEL OPT CAN 5 100 (ENDOMECHANICALS) ×2 IMPLANT
SOLUTION ANTI FOG 6CC (MISCELLANEOUS) ×2 IMPLANT
STRIP CLOSURE SKIN 1/2X4 (GAUZE/BANDAGES/DRESSINGS) ×2 IMPLANT
SUT MNCRL AB 4-0 PS2 18 (SUTURE) ×2 IMPLANT
TOWEL OR 17X26 10 PK STRL BLUE (TOWEL DISPOSABLE) ×2 IMPLANT
TOWEL OR NON WOVEN STRL DISP B (DISPOSABLE) ×2 IMPLANT
TRAY LAP CHOLE (CUSTOM PROCEDURE TRAY) ×2 IMPLANT
TROCAR BLADELESS OPT 5 100 (ENDOMECHANICALS) ×2 IMPLANT
TROCAR XCEL BLUNT TIP 100MML (ENDOMECHANICALS) ×2 IMPLANT
TROCAR XCEL NON-BLD 11X100MML (ENDOMECHANICALS) ×2 IMPLANT
TUBING INSUFFLATION 10FT LAP (TUBING) ×2 IMPLANT

## 2013-07-02 NOTE — Transfer of Care (Signed)
Immediate Anesthesia Transfer of Care Note  Patient: Sarah Chung  Procedure(s) Performed: Procedure(s) (LRB): LAPAROSCOPIC CHOLECYSTECTOMY WITH INTRAOPERATIVE CHOLANGIOGRAM (N/A)  Patient Location: PACU  Anesthesia Type: General  Level of Consciousness: sedated, patient cooperative and responds to stimulation  Airway & Oxygen Therapy: Patient Spontanous Breathing and Patient connected to face mask oxgen  Post-op Assessment: Report given to PACU RN and Post -op Vital signs reviewed and stable  Post vital signs: Reviewed and stable  Complications: No apparent anesthesia complications

## 2013-07-02 NOTE — Progress Notes (Signed)
Subjective: Stable and alert. Still has upper abdominal pain with slight radiation to back and should right shoulder. No nausea or vomiting.  Afebrile with stable vital signs. On unasyn. . WBC down to 8400.  Objective: Vital signs in last 24 hours: Temp:  [97 F (36.1 C)-98.2 F (36.8 C)] 97.9 F (36.6 C) (11/24 0246) Pulse Rate:  [50-60] 55 (11/24 0246) Resp:  [16-18] 18 (11/24 0246) BP: (88-158)/(55-85) 88/55 mmHg (11/24 0246) SpO2:  [96 %-100 %] 96 % (11/24 0246) Weight:  [180 lb (81.647 kg)] 180 lb (81.647 kg) (11/23 1611) Last BM Date: 07/01/13  Intake/Output from previous day: 11/23 0701 - 11/24 0700 In: 1040.8 [I.V.:1040.8] Out: 1200 [Urine:1200] Intake/Output this shift: Total I/O In: 774.6 [I.V.:774.6] Out: 1200 [Urine:1200]     EXAM: General appearance: alert. Very pleasant. Very anxious with lots of questions. Does not appear ill or toxic. Minimal distress. GI: abdomen is soft and nondistended. She is tender in the right upper quadrant. This is localized. I do not palpate a mass. Pfannenstiel incision noted.  Lab Results:   Recent Labs  07/01/13 1138 07/02/13 0500  WBC 13.6* 8.4  HGB 13.0 11.3*  HCT 38.7 34.6*  PLT 263 225   BMET  Recent Labs  07/01/13 1138  NA 135  K 4.1  CL 97  CO2 27  GLUCOSE 79  BUN 18  CREATININE 0.74  CALCIUM 9.2   PT/INR No results found for this basename: LABPROT, INR,  in the last 72 hours ABG No results found for this basename: PHART, PCO2, PO2, HCO3,  in the last 72 hours  Studies/Results: US Abdomen Complete  07/01/2013   CLINICAL DATA:  Right upper quadrant pain  EXAM: ULTRASOUND ABDOMEN COMPLETE  COMPARISON:  Correlated with CT of abdomen pelvis 10/20/2012  FINDINGS: Gallbladder  A large gallstone is appreciated. Within the gallbladder measuring 3.23 cm in diameter. The gallstone is lodged in the region of the neck of the gallbladder. The gallbladder wall thickness is 2.8 mm. There is no evidence of a  sonographic Murphy's sign no pericholecystic fluid.  Common bile duct  Diameter: 6.7 mm  Liver  Heterogeneous slightly echogenic architecture.  IVC  No abnormality visualized.  Pancreas  Visualized portion unremarkable.  Spleen  Size and appearance within normal limits.  Right Kidney  Length: 10.4 cm . Echogenicity within normal limits. No mass or hydronephrosis visualized.  Left Kidney  Length: 10.7 cm. Echogenicity within normal limits. No mass or hydronephrosis visualized.  Abdominal aorta  No aneurysm visualized.  IMPRESSION: Large nonmobile gallstone within the gallbladder. No evidence of cholecystitis.   Electronically Signed   By: Salome Holmes M.D.   On: 07/01/2013 13:07    Anti-infectives: Anti-infectives   Start     Dose/Rate Route Frequency Ordered Stop   07/01/13 2000  Ampicillin-Sulbactam (UNASYN) 3 g in sodium chloride 0.9 % 100 mL IVPB     3 g 100 mL/hr over 60 Minutes Intravenous Every 6 hours 07/01/13 1817     07/01/13 1500  Ampicillin-Sulbactam (UNASYN) 3 g in sodium chloride 0.9 % 100 mL IVPB     3 g 100 mL/hr over 60 Minutes Intravenous  Once 07/01/13 1347 07/01/13 1515      Assessment/Plan: s/p Procedure(s): LAPAROSCOPIC CHOLECYSTECTOMY WITH INTRAOPERATIVE CHOLANGIOGRAM  Acute cholecystitis with cholelithiasis. I believe it is appropriate to proceed with laparoscopic cholecystectomy and possible cholangiography today. I discussed the indications, details, technique, and numerous risk of the surgery with the patient once again. She understands all these  issues and all her questions were answered. She agrees with this plan.  Seizure disorder. On phenobarbital. Followed by Dr. Mikey Bussing.  History DVT during pregnancy. Prior Coumadin. Remote event.     LOS: 1 day    Naketa Daddario M. Derrell Lolling, M.D., [facility] General and Minimally invasive Surgery Breast and Colorectal Surgery Office:   [phone] Pager:   [phone]  07/02/2013

## 2013-07-02 NOTE — Preoperative (Signed)
Beta Blockers   Reason not to administer Beta Blockers:Not Applicable 

## 2013-07-02 NOTE — Anesthesia Postprocedure Evaluation (Signed)
  Anesthesia Post-op Note  Patient: Sarah Chung  Procedure(s) Performed: Procedure(s) (LRB): LAPAROSCOPIC CHOLECYSTECTOMY WITH INTRAOPERATIVE CHOLANGIOGRAM (N/A)  Patient Location: PACU  Anesthesia Type: General  Level of Consciousness: awake and alert   Airway and Oxygen Therapy: Patient Spontanous Breathing  Post-op Pain: mild  Post-op Assessment: Post-op Vital signs reviewed, Patient's Cardiovascular Status Stable, Respiratory Function Stable, Patent Airway and No signs of Nausea or vomiting  Last Vitals:  Filed Vitals:   07/02/13 1058  BP: 138/71  Pulse: 68  Temp: 37.5 C  Resp: 15    Post-op Vital Signs: stable   Complications: No apparent anesthesia complications

## 2013-07-02 NOTE — Op Note (Signed)
Patient Name:           Sarah Chung   Date of Surgery:        07/02/2013  Pre op Diagnosis:      Acute cholecystitis with cholelithiasis  Post op Diagnosis:    Same  Procedure:                 Laparoscopic cholecystectomy with cholangiogram  Surgeon:                     Angelia Mould. Derrell Lolling, M.D., FACS  Assistant:                      None  Operative Indications:   This is a 52 year old female who presented to the emergency room with a 24-hour history of abdominal pain. She has a seizure disorder. She pelvis was initially heartburn but it got much worse and did not respond to Mylanta she describes the pain as a band across the upper abdomen and in fact feels a little bit uncomfortable in her back and her right shoulder.Examination reveals tenderness localized to the right upper quadrant with some guarding but no mass. Lab work reveals a WBC of 13,600, normal liver function tests, normal urinalysis.Ultrasound showed a large non-mobile gallstone within the gallbladder but there was no overt evidence of inflammation. She has been on antibiotics overnight. She is still in pain and still tender in the right upper quadrant. She is brought to the operating room.  Operative Findings:       The gallbladder was acutely inflamed. Mottled and discolored, edematous. This required suction trocar to evacuate it's contents  to be able to handle the gallbladder. The gallbladder contained lots of yellowish sludge and a couple of large stones. The anatomy of the cystic duct, cystic artery, and common bile duct were conventional. The cholangiogram was normal, showing normal intrahepatic and extrahepatic biliary anatomy, no filling defects, and no obstruction with good flow of contrast into the duodenum. The liver, stomach, duodenum, small intestine, and large intestine were otherwise grossly normal. Minimal pelvic adhesions from cesarean section.  Procedure in Detail:          Following the induction of general  endotracheal anesthesia the patient's abdomen was prepped and draped in a sterile fashion. Surgical time out was performed. Antibiotics were given immediately prior to the incision. 0.5% Marcaine with epinephrine was used as a local infiltration anesthetic. A vertically oriented incision was made in the lower rim of the umbilicus. The fascia was incised in the midline the abdominal cavity entered under direct vision. An 11 mm Hassan trocar was inserted and secured with a pursestring suture of 0 Vicryl. Pneumoperitoneum was created and video  camera was inserted. An 11 mm trocar was placed in the subxiphoid region and Two 5 mm trocars placed in the right upper quadrant.     Using a suction trocar I evacuated  the bile from the gallbladder. It was white bile in with  some yellowish sludge material present. We then manipulated the gallbladder. There was a lot of edema in the lower gallbladder and infundibulum. We slowly took down the adhesions and teased the duodenum away. This was uneventful. I then incised the peritoneum to the right and left of the neck of the gallbladder and slowly dissected out the cystic duct and cystic artery. I created a large window behind the cystic duct and artery until I had  a good critical view. The  cystic artery was secured with multiple ligaclips and divided.     The cholangiogram Catheter  was inserted Into the cystic duct and a cholangiogram obtained using the C-arm. The cholangiogram was normal as described above. The cholangiogram catheter was removed, the cystic duct secured with 3 additional metal clips and then divided. The gallbladder was dissected from its bed with electrocautery, placed in a specimen bag and removed.    We did spill some yellowish sludge and so we irrigated the subphrenic, subhepatic and right paracolic gutter spaces with 3 L of saline until the irrigation fluid was completely clear. We inspected the clips on the cystic duct and cystic artery and they  appeared secure and there was no bleeding or bile leakage. The trocars were removed. The pneumoperitoneum was released. The fascia at the umbilicus was closed with 0 Vicryl sutures. Skin incisions were closed with subcuticular stitches of 4-0 Monocryl and Dermabond. The patient tolerated the procedure well was taken to recovery room in stable condition. Estimated blood loss 15 cc. Counts correct. Complications none.     Angelia Mould. Derrell Lolling, M.D., FACS General and Minimally Invasive Surgery Breast and Colorectal Surgery  07/02/2013 9:52 AM

## 2013-07-03 ENCOUNTER — Encounter (HOSPITAL_COMMUNITY): Payer: Self-pay | Admitting: General Surgery

## 2013-07-03 DIAGNOSIS — K8000 Calculus of gallbladder with acute cholecystitis without obstruction: Secondary | ICD-10-CM

## 2013-07-03 HISTORY — DX: Calculus of gallbladder with acute cholecystitis without obstruction: K80.00

## 2013-07-03 LAB — COMPREHENSIVE METABOLIC PANEL
AST: 27 U/L (ref 0–37)
BUN: 15 mg/dL (ref 6–23)
CO2: 26 mEq/L (ref 19–32)
Chloride: 103 mEq/L (ref 96–112)
Creatinine, Ser: 0.89 mg/dL (ref 0.50–1.10)
GFR calc non Af Amer: 72 mL/min — ABNORMAL LOW (ref >90)
Glucose, Bld: 119 mg/dL — ABNORMAL HIGH (ref 70–99)
Potassium: 3.9 mEq/L (ref 3.5–5.1)
Total Bilirubin: 0.2 mg/dL — ABNORMAL LOW (ref 0.3–1.2)

## 2013-07-03 LAB — CBC
HCT: 33.7 % — ABNORMAL LOW (ref 36.0–46.0)
MCH: 31.3 pg (ref 26.0–34.0)
MCV: 95.7 fL (ref 78.0–100.0)
Platelets: 219 10*3/uL (ref 150–400)
RBC: 3.52 MIL/uL — ABNORMAL LOW (ref 3.87–5.11)
RDW: 13.6 % (ref 11.5–15.5)
WBC: 12.7 10*3/uL — ABNORMAL HIGH (ref 4.0–10.5)

## 2013-07-03 LAB — LIPASE, BLOOD: Lipase: 36 U/L (ref 11–59)

## 2013-07-03 MED ORDER — PREDNISOLONE 5 MG PO TABS
5.0000 mg | ORAL_TABLET | Freq: Three times a day (TID) | ORAL | Status: DC
  Administered 2013-07-04 (×2): 5 mg via ORAL
  Filled 2013-07-03 (×5): qty 1

## 2013-07-03 MED ORDER — SENNOSIDES-DOCUSATE SODIUM 8.6-50 MG PO TABS: 2.0000 | ORAL_TABLET | Freq: Every evening | ORAL | Status: DC | PRN

## 2013-07-03 MED ORDER — PREDNISOLONE 5 MG PO TABS
10.0000 mg | ORAL_TABLET | Freq: Every day | ORAL | Status: AC
  Administered 2013-07-03: 10 mg via ORAL
  Filled 2013-07-03: qty 2

## 2013-07-03 MED ORDER — PREDNISOLONE 5 MG PO TABS: 5.0000 mg | ORAL_TABLET | Freq: Every day | ORAL | Status: DC

## 2013-07-03 MED ORDER — LORAZEPAM 1 MG PO TABS: 1.0000 mg | ORAL_TABLET | Freq: Four times a day (QID) | ORAL | Status: DC | PRN

## 2013-07-03 MED ORDER — HYDROMORPHONE HCL 2 MG PO TABS: 1.0000 mg | ORAL_TABLET | Freq: Four times a day (QID) | ORAL | Status: DC | PRN

## 2013-07-03 MED ORDER — IBUPROFEN 600 MG PO TABS
600.0000 mg | ORAL_TABLET | Freq: Four times a day (QID) | ORAL | Status: DC | PRN
  Filled 2013-07-03 (×3): qty 1

## 2013-07-03 MED ORDER — PREDNISOLONE 5 MG PO TABS: 5.0000 mg | ORAL_TABLET | Freq: Two times a day (BID) | ORAL | Status: DC

## 2013-07-03 NOTE — Care Management Note (Signed)
    Page 1 of 1   07/03/2013     11:01:42 AM   CARE MANAGEMENT NOTE 07/03/2013  Patient:  Sarah Chung, Sarah Chung   Account Number:  [id number]  Date Initiated:  07/03/2013  Documentation initiated by:  Lorenda Ishihara  Subjective/Objective Assessment:   52 yo female admitted s/p lap chole. PTA lived at home with spouse.     Action/Plan:   Home when stable   Anticipated DC Date:  07/03/2013   Anticipated DC Plan:  HOME/SELF CARE      DC Planning Services  CM consult      Choice offered to / List presented to:             Status of service:  Completed, signed off Medicare Important Message given?   (If response is "NO", the following Medicare IM given date fields will be blank) Date Medicare IM given:   Date Additional Medicare IM given:    Discharge Disposition:  HOME/SELF CARE  Per UR Regulation:  Reviewed for med. necessity/level of care/duration of stay  If discussed at Long Length of Stay Meetings, dates discussed:    Comments:

## 2013-07-03 NOTE — Progress Notes (Signed)
1 Day Post-Op  Subjective: Up walking in the halls now, he major concern is the pain medicine and she was so upset she didn't think she could breath.  She did get relief with the fentanyl, she said that morphine did not relieve her pain before surgery either. She was able to eat breakfast without any issue.  She is very anxious. Objective: Vital signs in last 24 hours: Temp:  [97.8 F (36.6 C)-99.5 F (37.5 C)] 97.8 F (36.6 C) (11/25 0532) Pulse Rate:  [60-80] 64 (11/25 0532) Resp:  [12-18] 16 (11/25 0532) BP: (129-154)/(71-90) 138/78 mmHg (11/25 0532) SpO2:  [93 %-100 %] 98 % (11/25 0532) Last BM Date: 07/01/13 Diet: fat modified Nothing recorded. TM 99.1 Labs OK except for elevated WBC IOC negative  Intake/Output from previous day: 11/24 0701 - 11/25 0700 In: 1510 [I.V.:1510] Out: 6050 [Urine:6050] Intake/Output this shift: Total I/O In: -  Out: 600 [Urine:600]  General appearance: alert, cooperative, no distress and mild discomfort, walking but rather bent over.   Lab Results:   Recent Labs  07/02/13 0500 07/03/13 0645  WBC 8.4 12.7*  HGB 11.3* 11.0*  HCT 34.6* 33.7*  PLT 225 219    BMET  Recent Labs  07/02/13 0500 07/03/13 0645  NA 138 137  K 4.2 3.9  CL 103 103  CO2 27 26  GLUCOSE 104* 119*  BUN 14 15  CREATININE 0.89 0.89  CALCIUM 8.5 8.9   PT/INR No results found for this basename: LABPROT, INR,  in the last 72 hours   Recent Labs Lab 07/01/13 1138 07/02/13 0500 07/03/13 0645  AST 16 12 27   ALT 25 19 50*  ALKPHOS 112 99 96  BILITOT 0.1* 0.3 0.2*  PROT 8.5* 6.8 7.2  ALBUMIN 3.8 3.0* 3.0*     Lipase     Component Value Date/Time   LIPASE 36 07/03/2013 0645     Studies/Results: Dg Cholangiogram Operative  07/02/2013   CLINICAL DATA:  Right upper quadrant abdominal pain, laparoscopic cholecystectomy  EXAM: INTRAOPERATIVE CHOLANGIOGRAM  COMPARISON:  Abdominal ultrasound - 07/01/2013; CT abdomen pelvis -10/10/2012  FLUOROSCOPY  TIME:  18 seconds  FINDINGS: Intraoperative angiographic images of the right upper abdominal quadrant during laparoscopic cholecystectomy are provided for review.  Surgical clips overlie the expected location of the gallbladder fossa.  Contrast injection demonstrates selective cannulation of the central aspect of the cystic duct.  There is passage of contrast through the central aspect of the cystic duct with filling of a non dilated common bile duct. There is passage of contrast though the CBD and into the descending portion of the duodenum.  There is minimal reflux of injected contrast into the common hepatic duct and central aspect of the non dilated intrahepatic biliary system.  There are no discrete filling defects within the opacified portions of the biliary system to suggest the presence of choledocholithiasis.  IMPRESSION: Intraoperative cholangiogram as above. No discrete filling defects to suggest the presence of choledocholithiasis.   Electronically Signed   By: Simonne Come M.D.   On: 07/02/2013 10:23   US Abdomen Complete  07/01/2013   CLINICAL DATA:  Right upper quadrant pain  EXAM: ULTRASOUND ABDOMEN COMPLETE  COMPARISON:  Correlated with CT of abdomen pelvis 10/20/2012  FINDINGS: Gallbladder  A large gallstone is appreciated. Within the gallbladder measuring 3.23 cm in diameter. The gallstone is lodged in the region of the neck of the gallbladder. The gallbladder wall thickness is 2.8 mm. There is no evidence of a sonographic  Murphy's sign no pericholecystic fluid.  Common bile duct  Diameter: 6.7 mm  Liver  Heterogeneous slightly echogenic architecture.  IVC  No abnormality visualized.  Pancreas  Visualized portion unremarkable.  Spleen  Size and appearance within normal limits.  Right Kidney  Length: 10.4 cm . Echogenicity within normal limits. No mass or hydronephrosis visualized.  Left Kidney  Length: 10.7 cm. Echogenicity within normal limits. No mass or hydronephrosis visualized.  Abdominal  aorta  No aneurysm visualized.  IMPRESSION: Large nonmobile gallstone within the gallbladder. No evidence of cholecystitis.   Electronically Signed   By: Salome Holmes M.D.   On: 07/01/2013 13:07    Medications: . ampicillin-sulbactam (UNASYN) IV  3 g Intravenous Q6H  . enoxaparin (LOVENOX) injection  40 mg Subcutaneous Q24H  . PHENobarbital  64.8 mg Oral BID   Prior to Admission medications   Medication Sig Start Date End Date Taking? Authorizing Provider  aluminum-magnesium hydroxide-simethicone (MAALOX) 200-200-20 MG/5ML SUSP Take 30 mLs by mouth 3 (three) times daily as needed. Heart burn   Yes Historical Provider, MD  PHENobarbital (LUMINAL) 64.8 MG tablet Take 64.8 mg by mouth 2 (two) times daily.   Yes Historical Provider, MD  phentermine 37.5 MG capsule Take 37.5 mg by mouth every morning.   Yes Historical Provider, MD  prednisoLONE 5 MG TABS tablet Take 5 mg by mouth daily. Pt is doing a taper dose days 1-4 2 tablets 2 tablet before breakfast and bedtime then 1 tablet at lunch, supper, days 5-8 1 tablet at breakfast, lunch, dinner, and bedtime, days 9-12 1 tablet at breakfast and 1 tablet at bedtime.  Pt is on day day 4 06/27/13  Yes Historical Provider, MD  simethicone (MYLICON) 80 MG chewable tablet Chew 240 mg by mouth once.   Yes Historical Provider, MD     Assessment/Plan 1.  Acute cholecystitis with cholelithiasis; s/p Laparoscopic cholecystectomy with cholangiogram 07/02/13 Dr Derrell Lolling 2.  Hx of seizures 3.  Hx of DVT 4.Body mass index is 30.8  Plan:  I will try her on some hydrocodone and see if this works better for her.  We will watch and see how she does this AM.  Still complaining of significant discomfort.  IV fentanyl works the oxycodone and hydrocodone have not been very effective.  i will add some PO ibuprofen, and leave some PO dilaudid if none of the above are effective. Recheck labs and sennakot Hs PRN.  LOS: 2 days    Aarron Wierzbicki 07/03/2013

## 2013-07-03 NOTE — Progress Notes (Signed)
1 Day Post-Op  Subjective: Stable and alert. Lots of anxiety and fearful. Tearful. Says she has abdominal pain and the IV medication works and the [employer] does not. No nausea and vomiting. Up to bathroom. Urine output high.  SpO2 93-98% on room air.Afebrile. BP 130/78. Heart rate 64 and regular.  Operative findings discussed with the patient.  Objective: Vital signs in last 24 hours: Temp:  [97.8 F (36.6 C)-99.5 F (37.5 C)] 97.8 F (36.6 C) (11/25 0532) Pulse Rate:  [56-80] 64 (11/25 0532) Resp:  [12-18] 16 (11/25 0532) BP: (114-154)/(71-90) 138/78 mmHg (11/25 0532) SpO2:  [93 %-100 %] 98 % (11/25 0532) Last BM Date: 07/01/13  Intake/Output from previous day: 11/24 0701 - 11/25 0700 In: 1510 [I.V.:1510] Out: 6050 [Urine:6050] Intake/Output this shift: Total I/O In: -  Out: 2750 [Urine:2750]  General appearance: anxious, fearful, otherwise in the physical distress. Resp: clear to auscultation bilaterally GI: abdomen soft. Incisions look good. Not distended. Expected incisional tenderness. No unusual tenderness.  Lab Results:  No results found for this or any previous visit (from the past 24 hour(s)).   Studies/Results: @RISRSLT24 @  . ampicillin-sulbactam (UNASYN) IV  3 g Intravenous Q6H  . ampicillin-sulbactam (UNASYN) IV  3 g Intravenous Q6H  . enoxaparin (LOVENOX) injection  40 mg Subcutaneous Q24H  . PHENobarbital  64.8 mg Oral BID     Assessment/Plan: s/p Procedure(s): LAPAROSCOPIC CHOLECYSTECTOMY WITH INTRAOPERATIVE CHOLANGIOGRAM  POD #1. Laparoscopic cholecystectomy with cholangiogram. Objectively stable, reports of pain out of proportion to physical findings Check CBC, cmet,  lipase now to screen for problems Reduce IV rate due to polyuria. Allowed oral or IV narcotics for now.  Seizure disorder. Continue phenobarbital.  History DVT during pregnancy. Prior Coumadin. Remote event. On Lovenox for DVT prophylaxis at this time.   @PROBHOSP @  LOS: 2  days    Herny Scurlock M. Derrell Lolling, M.D., [facility] General and Minimally invasive Surgery Breast and Colorectal Surgery Office:   [phone] Pager:   [phone]  07/03/2013  . .prob

## 2013-07-03 NOTE — Progress Notes (Signed)
I have issued and evaluated this patient today. I agree with the assessment and plan outlined by Mr. Marlyne Beards, [place]. Please see my note from earlier in the day.  Her lab work is not remarkable the WBC is 12,000 but LFTs and lipase are normal.  We will continue her hospitalization and observation. Consider further imaging studies if pain persists.   Angelia Mould. Derrell Lolling, M.D., [facility] General and Minimally invasive Surgery Breast and Colorectal Surgery Office:   [phone] Pager:   [phone]

## 2013-07-04 LAB — COMPREHENSIVE METABOLIC PANEL
AST: 23 U/L (ref 0–37)
Albumin: 2.9 g/dL — ABNORMAL LOW (ref 3.5–5.2)
Alkaline Phosphatase: 95 U/L (ref 39–117)
Calcium: 9 mg/dL (ref 8.4–10.5)
Chloride: 103 mEq/L (ref 96–112)
Creatinine, Ser: 0.96 mg/dL (ref 0.50–1.10)
Potassium: 4.5 mEq/L (ref 3.5–5.1)
Total Bilirubin: 0.2 mg/dL — ABNORMAL LOW (ref 0.3–1.2)

## 2013-07-04 LAB — CBC
MCH: 31.4 pg (ref 26.0–34.0)
MCV: 95.3 fL (ref 78.0–100.0)
Platelets: 209 10*3/uL (ref 150–400)
RDW: 13.4 % (ref 11.5–15.5)
WBC: 10.6 10*3/uL — ABNORMAL HIGH (ref 4.0–10.5)

## 2013-07-04 MED ORDER — HYDROCODONE-ACETAMINOPHEN 5-325 MG PO TABS: 1.0000 | ORAL_TABLET | Freq: Four times a day (QID) | ORAL | Status: DC | PRN

## 2013-07-04 MED ORDER — POLYETHYLENE GLYCOL 3350 17 G PO PACK
17.0000 g | PACK | Freq: Once | ORAL | Status: AC
  Administered 2013-07-04: 17 g via ORAL
  Filled 2013-07-04: qty 1

## 2013-07-04 NOTE — Discharge Summary (Signed)
Patient ID: Sarah Chung [id number] 52 y.o. 1961/06/26  Admit date: 07/01/2013  Discharge date and time: 07/04/2013  Admitting Physician: Ernestene Mention  Discharge Physician: Ernestene Mention  Admission Diagnoses: Cholelithiasis [574.20]  Discharge Diagnoses: Acute cholecystitis with cholelithiasis Seizure disorder Remote history DVT during pregnancy  Operations: Procedure(s): LAPAROSCOPIC CHOLECYSTECTOMY WITH INTRAOPERATIVE CHOLANGIOGRAM  Admission Condition: fair  Discharged Condition: good  Indication for Admission: This is a 52 year old female who presented to the emergency room with a 24-hour history of abdominal pain. She has a seizure disorder. She thought this  was  heartburn but it got much worse and did not respond to Mylanta she describes the pain as a band across the upper abdomen and in fact feels a little bit uncomfortable in her back and her right shoulder.Examination reveals tenderness localized to the right upper quadrant with some guarding but no mass. Lab work reveals a WBC of 13,600, normal liver function tests, normal urinalysis.Ultrasound showed a large non-mobile gallstone within the gallbladder but there was no overt evidence of inflammation. She has been on antibiotics overnight. She is still in pain and still tender in the right upper quadrant. She is admitted and brought to the operating room.   Hospital Course: On the morning following admission the patient was taken to the operating room. She underwent laparoscopic cholecystectomy with cholangiogram.The gallbladder was acutely inflamed. Mottled and discolored, edematous. This required suction trocar to evacuate it's contents to be able to handle the gallbladder. The gallbladder contained lots of yellowish sludge and a couple of large stones. The anatomy of the cystic duct, cystic artery, and common bile duct were conventional. The cholangiogram was normal, showing normal intrahepatic and extrahepatic  biliary anatomy, no filling defects, and no obstruction with good flow of contrast into the duodenum. The liver, stomach, duodenum, small intestine, and large intestine were otherwise grossly normal. Minimal pelvic adhesions from cesarean section.    Postoperatively she was stable. On the morning following surgery she was having a lot of pain and anxiety and crying. We checked lab work which looked reasonably normal except for borderline elevation of WBC. Readjusted her medications. She improved the patient became ambulatory in the hall, tolerated a regular diet, had no trouble with voiding and the pain improved. On the day of discharge she felt well and wanted to go home. Her daughter was here with her. Her abdomen is much softer and minimally tender. The wounds looked fine. She was instructed in diet and activities and management of constipation. She was given a prescription for hydrocodone. She was asked to return to see me in the office in 3 weeks.   Consults: None  Significant Diagnostic Studies: Gallbladder ultrasound. Intraoperative cholangiogram. Surgical pathology.  Treatments: surgery: Laparoscopic cholecystectomy with cholangiogram  Disposition: Home  Patient Instructions:    Medication List         aluminum-magnesium hydroxide-simethicone 200-200-20 MG/5ML Susp  Commonly known as:  MAALOX  Take 30 mLs by mouth 3 (three) times daily as needed. Heart burn     HYDROcodone-acetaminophen 5-325 MG per tablet  Commonly known as:  NORCO  Take 1-2 tablets by mouth every 6 (six) hours as needed.     PHENobarbital 64.8 MG tablet  Commonly known as:  LUMINAL  Take 64.8 mg by mouth 2 (two) times daily.     phentermine 37.5 MG capsule  Take 37.5 mg by mouth every morning.     prednisoLONE 5 MG Tabs tablet  Take 5 mg by mouth daily. Pt is doing  a taper dose days 1-4 2 tablets 2 tablet before breakfast and bedtime then 1 tablet at lunch, supper, days 5-8 1 tablet at breakfast, lunch,  dinner, and bedtime, days 9-12 1 tablet at breakfast and 1 tablet at bedtime.  Pt is on day day 4     simethicone 80 MG chewable tablet  Commonly known as:  MYLICON  Chew 240 mg by mouth once.        Activity: activity as tolerated Diet: low fat, low cholesterol diet Wound Care: none needed  Follow-up:  With Dr. Derrell Lolling in 3 weeks.  Signed: Angelia Mould. Derrell Lolling, M.D., FACS General and minimally invasive surgery Breast and Colorectal Surgery  07/04/2013, 5:44 AM

## 2013-07-23 ENCOUNTER — Other Ambulatory Visit: Payer: Self-pay | Admitting: Nurse Practitioner

## 2013-07-23 NOTE — Telephone Encounter (Signed)
Rx faxed

## 2013-07-24 ENCOUNTER — Ambulatory Visit (INDEPENDENT_AMBULATORY_CARE_PROVIDER_SITE_OTHER): Payer: BC Managed Care – PPO | Admitting: General Surgery

## 2013-07-24 ENCOUNTER — Other Ambulatory Visit: Payer: Self-pay | Admitting: Nurse Practitioner

## 2013-07-24 ENCOUNTER — Encounter (INDEPENDENT_AMBULATORY_CARE_PROVIDER_SITE_OTHER): Payer: Self-pay | Admitting: General Surgery

## 2013-07-24 VITALS — BP 122/68 | HR 62 | Temp 98.0°F | Resp 18 | Ht 64.0 in | Wt 183.0 lb

## 2013-07-24 DIAGNOSIS — K8010 Calculus of gallbladder with chronic cholecystitis without obstruction: Secondary | ICD-10-CM

## 2013-07-24 NOTE — Progress Notes (Signed)
Sarah Chung [birth year] [id number] 07/24/2013   Sarah Chung is a 52 y.o. female who had a laparoscopic cholecystectomy with intraoperative cholangiogram by Dr. Derrell Lolling.  The pathology report confirmed Gallbladder - CHRONIC CHOLECYSTITIS AND CHOLELITHIASIS.   The patient reports that they are feeling well with normal bowel movements and good appetite.  The pre-operative symptoms of abdominal pain, nausea, and vomiting have resolved.    Physical examination -  BP 122/68  Pulse 62  Temp(Src) 98 F (36.7 C)  Resp 18  Ht 5\' 4"  (1.626 m)  Wt 83.008 kg (183 lb)  BMI 31.40 kg/m2 Incisions appear well-healed with no sign of infection or bleeding.   Abdomen - soft, non-tender  Impression:  s/p laparoscopic cholecystectomy  Plan:  She may resume a regular diet and full activity.  She may follow-up on a PRN basis.

## 2013-07-24 NOTE — Patient Instructions (Signed)
Call if you have a problem. 

## 2013-07-25 NOTE — Telephone Encounter (Signed)
Rx faxed to [facility] at [phone].

## 2013-10-23 ENCOUNTER — Ambulatory Visit: Payer: BC Managed Care – PPO | Admitting: Neurology

## 2013-11-12 ENCOUNTER — Other Ambulatory Visit (HOSPITAL_COMMUNITY): Payer: Self-pay | Admitting: Family Medicine

## 2013-11-12 DIAGNOSIS — N61 Mastitis without abscess: Secondary | ICD-10-CM

## 2013-11-28 ENCOUNTER — Encounter (HOSPITAL_COMMUNITY): Payer: BC Managed Care – PPO

## 2014-01-09 ENCOUNTER — Encounter: Payer: Self-pay | Admitting: Neurology

## 2014-01-09 ENCOUNTER — Encounter (INDEPENDENT_AMBULATORY_CARE_PROVIDER_SITE_OTHER): Payer: Self-pay

## 2014-01-09 ENCOUNTER — Ambulatory Visit (INDEPENDENT_AMBULATORY_CARE_PROVIDER_SITE_OTHER): Payer: BC Managed Care – PPO | Admitting: Neurology

## 2014-01-09 VITALS — BP 143/90 | HR 68 | Resp 16 | Ht 64.5 in | Wt 192.0 lb

## 2014-01-09 DIAGNOSIS — F339 Major depressive disorder, recurrent, unspecified: Secondary | ICD-10-CM

## 2014-01-09 DIAGNOSIS — R569 Unspecified convulsions: Secondary | ICD-10-CM

## 2014-01-09 DIAGNOSIS — Z5181 Encounter for therapeutic drug level monitoring: Secondary | ICD-10-CM | POA: Insufficient documentation

## 2014-01-09 MED ORDER — PHENOBARBITAL 64.8 MG PO TABS: 64.8000 mg | ORAL_TABLET | Freq: Two times a day (BID) | ORAL | Status: DC

## 2014-01-09 MED ORDER — ESTROGENS, CONJUGATED 0.625 MG/GM VA CREA: 1.0000 | TOPICAL_CREAM | Freq: Every day | VAGINAL | Status: DC

## 2014-01-09 MED ORDER — PHENOBARBITAL 32.4 MG PO TABS: 32.4000 mg | ORAL_TABLET | Freq: Every day | ORAL | Status: DC
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
## 2014-01-09 NOTE — Patient Instructions (Addendum)
Exercise to Lose Weight Exercise and a healthy diet may help you lose weight. Your doctor may suggest specific exercises. EXERCISE IDEAS AND TIPS  Choose low-cost things you enjoy doing, such as walking, bicycling, or exercising to workout videos.  Take stairs instead of the elevator.  Walk during your lunch break.  Park your car further away from work or school.  Go to a gym or an exercise class.  Start with 5 to 10 minutes of exercise each day. Build up to 30 minutes of exercise 4 to 6 days a week.  Wear shoes with good support and comfortable clothes.  Stretch before and after working out.  Work out until you breathe harder and your heart beats faster.  Drink extra water when you exercise.  Do not do so much that you hurt yourself, feel dizzy, or get very short of breath. Exercises that burn about 150 calories:  Running 1  miles in 15 minutes.  Playing volleyball for 45 to 60 minutes.  Washing and waxing a car for 45 to 60 minutes.  Playing touch football for 45 minutes.  Walking 1  miles in 35 minutes.  Pushing a stroller 1  miles in 30 minutes.  Playing basketball for 30 minutes.  Raking leaves for 30 minutes.  Bicycling 5 miles in 30 minutes.  Walking 2 miles in 30 minutes.  Dancing for 30 minutes.  Shoveling snow for 15 minutes.  Swimming laps for 20 minutes.  Walking up stairs for 15 minutes.  Bicycling 4 miles in 15 minutes.  Gardening for 30 to 45 minutes.  Jumping rope for 15 minutes.  Washing windows or floors for 45 to 60 minutes. Document Released: 08/28/2010 Document Revised: 10/18/2011 Document Reviewed: 08/28/2010 [facility] Patient Information 2014 [place], [place].  Reduce the phenobarbital to one 1/2  Tablet  32.4 mg in AM and one of 64.8 mg at night

## 2014-01-10 LAB — COMPREHENSIVE METABOLIC PANEL
ALT: 43 IU/L — AB (ref 0–32)
AST: 30 IU/L (ref 0–40)
Albumin/Globulin Ratio: 1.2 (ref 1.1–2.5)
Albumin: 4.5 g/dL (ref 3.5–5.5)
Alkaline Phosphatase: 122 IU/L — ABNORMAL HIGH (ref 39–117)
BUN/Creatinine Ratio: 18 (ref 9–23)
BUN: 17 mg/dL (ref 6–24)
CALCIUM: 9.9 mg/dL (ref 8.7–10.2)
CHLORIDE: 98 mmol/L (ref 97–108)
CO2: 25 mmol/L (ref 18–29)
Creatinine, Ser: 0.96 mg/dL (ref 0.57–1.00)
GFR calc Af Amer: 78 mL/min/{1.73_m2} (ref >59)
GFR calc non Af Amer: 67 mL/min/{1.73_m2} (ref >59)
GLUCOSE: 78 mg/dL (ref 65–99)
Globulin, Total: 3.7 g/dL (ref 1.5–4.5)
POTASSIUM: 4.2 mmol/L (ref 3.5–5.2)
Sodium: 141 mmol/L (ref 134–144)
TOTAL PROTEIN: 8.2 g/dL (ref 6.0–8.5)
Total Bilirubin: 0.3 mg/dL (ref 0.0–1.2)

## 2014-01-15 ENCOUNTER — Telehealth: Payer: Self-pay | Admitting: *Deleted

## 2014-01-15 NOTE — Telephone Encounter (Signed)
Spoke with pt and gave her the results of CMP.  (mildly elevated Liver enzyme).  She will start taking 32.4 mg Phenobarbital po qhs. She will let us know how she does.

## 2014-01-15 NOTE — Progress Notes (Signed)
Quick Note:  Gave results to pt and Dr. Marko Stai recommendation. Pt to call back if needed. ______

## 2014-02-12 ENCOUNTER — Ambulatory Visit: Payer: BC Managed Care – PPO | Admitting: Neurology

## 2014-05-14 ENCOUNTER — Ambulatory Visit (INDEPENDENT_AMBULATORY_CARE_PROVIDER_SITE_OTHER): Payer: BC Managed Care – PPO | Admitting: Neurology

## 2014-05-14 ENCOUNTER — Encounter: Payer: Self-pay | Admitting: Neurology

## 2014-05-14 VITALS — BP 132/88 | HR 83 | Resp 14 | Ht 64.25 in | Wt 180.0 lb

## 2014-05-14 DIAGNOSIS — G40209 Localization-related (focal) (partial) symptomatic epilepsy and epileptic syndromes with complex partial seizures, not intractable, without status epilepticus: Secondary | ICD-10-CM

## 2014-05-14 MED ORDER — PHENOBARBITAL 32.4 MG PO TABS: 64.8000 mg | ORAL_TABLET | Freq: Every day | ORAL | Status: DC

## 2014-05-14 NOTE — Patient Instructions (Signed)
We can discuss changing the phenobarbitol to an even lower dose in 6 month.
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
## 2014-05-15 LAB — COMPREHENSIVE METABOLIC PANEL
A/G RATIO: 1.2 (ref 1.1–2.5)
ALT: 23 IU/L (ref 0–32)
AST: 16 IU/L (ref 0–40)
Albumin: 4.4 g/dL (ref 3.5–5.5)
Alkaline Phosphatase: 127 IU/L — ABNORMAL HIGH (ref 39–117)
BUN/Creatinine Ratio: 21 (ref 9–23)
BUN: 21 mg/dL (ref 6–24)
CALCIUM: 9.4 mg/dL (ref 8.7–10.2)
CO2: 23 mmol/L (ref 18–29)
CREATININE: 0.98 mg/dL (ref 0.57–1.00)
Chloride: 97 mmol/L (ref 97–108)
GFR calc Af Amer: 76 mL/min/{1.73_m2} (ref >59)
GFR, EST NON AFRICAN AMERICAN: 66 mL/min/{1.73_m2} (ref >59)
GLOBULIN, TOTAL: 3.8 g/dL (ref 1.5–4.5)
Glucose: 88 mg/dL (ref 65–99)
POTASSIUM: 4.3 mmol/L (ref 3.5–5.2)
Sodium: 139 mmol/L (ref 134–144)
Total Bilirubin: 0.2 mg/dL (ref 0.0–1.2)
Total Protein: 8.2 g/dL (ref 6.0–8.5)

## 2014-05-31 NOTE — Progress Notes (Signed)
Quick Note:  LMVM for pt that labs normal. (cell #) . To call back if questions. ______

## 2014-12-09 ENCOUNTER — Other Ambulatory Visit: Payer: Self-pay | Admitting: Neurology

## 2014-12-09 NOTE — Telephone Encounter (Signed)
Rx signed and faxed.

## 2014-12-09 NOTE — Telephone Encounter (Signed)
Pharmacy is requesting to fill 64.8mg  one tab daily instead of 32.4mg  two tabs daily.  Total dose remains the same.

## 2015-01-30 ENCOUNTER — Other Ambulatory Visit: Payer: Self-pay | Admitting: Obstetrics and Gynecology

## 2015-02-03 LAB — CYTOLOGY - PAP

## 2015-03-20 ENCOUNTER — Encounter: Payer: Self-pay | Admitting: *Deleted

## 2015-04-15 ENCOUNTER — Encounter: Payer: Self-pay | Admitting: Internal Medicine

## 2015-05-15 ENCOUNTER — Ambulatory Visit: Payer: BC Managed Care – PPO | Admitting: Nurse Practitioner

## 2015-06-12 ENCOUNTER — Ambulatory Visit (INDEPENDENT_AMBULATORY_CARE_PROVIDER_SITE_OTHER): Payer: BLUE CROSS/BLUE SHIELD | Admitting: Neurology

## 2015-06-12 ENCOUNTER — Encounter: Payer: Self-pay | Admitting: Neurology

## 2015-06-12 VITALS — BP 122/80 | HR 72 | Resp 20 | Ht 64.0 in | Wt 178.0 lb

## 2015-06-12 DIAGNOSIS — Z5320 Procedure and treatment not carried out because of patient's decision for unspecified reasons: Secondary | ICD-10-CM | POA: Diagnosis not present

## 2015-06-12 DIAGNOSIS — Z780 Asymptomatic menopausal state: Secondary | ICD-10-CM

## 2015-06-12 DIAGNOSIS — R569 Unspecified convulsions: Secondary | ICD-10-CM | POA: Diagnosis not present

## 2015-06-12 MED ORDER — PHENOBARBITAL 64.8 MG PO TABS: 32.4000 mg | ORAL_TABLET | Freq: Every day | ORAL | Status: DC

## 2015-06-12 MED ORDER — ESTROGENS, CONJUGATED 0.625 MG/GM VA CREA: 1.0000 | TOPICAL_CREAM | Freq: Every day | VAGINAL | Status: DC
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
## 2015-08-08 ENCOUNTER — Other Ambulatory Visit: Payer: Self-pay | Admitting: Neurology

## 2015-08-12 ENCOUNTER — Other Ambulatory Visit: Payer: Self-pay

## 2015-08-12 DIAGNOSIS — Z780 Asymptomatic menopausal state: Secondary | ICD-10-CM

## 2015-08-12 DIAGNOSIS — R569 Unspecified convulsions: Secondary | ICD-10-CM

## 2015-08-12 DIAGNOSIS — Z5320 Procedure and treatment not carried out because of patient's decision for unspecified reasons: Secondary | ICD-10-CM

## 2015-08-12 MED ORDER — PHENOBARBITAL 64.8 MG PO TABS
64.8000 mg | ORAL_TABLET | Freq: Every day | ORAL | Status: DC
Start: 2015-08-12 — End: 2015-12-17

## 2015-10-17 ENCOUNTER — Telehealth: Payer: Self-pay | Admitting: Neurology

## 2015-10-17 NOTE — Telephone Encounter (Signed)
Patient had 3 auras today, she had complex partial seizure, out of body experience x3 today without loss of conciousness.  She is now taking PB 64.8mg  1/2 daily,I have advise her to take one extra tab today, increase daily dose to 64.8mg  one tab daily.  She is advised do not drive, call back clinic for recurrent symptoms.

## 2015-10-17 NOTE — Telephone Encounter (Signed)
Patient is calling. The patient states Dr. Vickey Hugerohmeier had given her an emergency Rx before when she was not feeling well but she never had it filled. She does not know the name of the medication but she feels like she may need it. She has lessened her PHENobarbital (LUMINAL) 64.8 MG tablet and wonders if she she take more of the medication. She is on Doxycycline now for an Upper Respiratory Infection and her right ear is clogged. She says she is very tired. Please call and discuss.

## 2015-10-17 NOTE — Telephone Encounter (Signed)
Again not my patient

## 2015-10-20 ENCOUNTER — Other Ambulatory Visit: Payer: Self-pay

## 2015-10-20 MED ORDER — ALPRAZOLAM 0.5 MG PO TABS: 0.5000 mg | ORAL_TABLET | Freq: Every evening | ORAL | Status: DC | PRN

## 2015-10-20 NOTE — Telephone Encounter (Signed)
i think she can return and  stay on 67 mg nightly without any problem. It will help her anxiety. D

## 2015-10-20 NOTE — Telephone Encounter (Addendum)
Pt is calling back to speak with Dr. Vickey Hugerohmeier about what happened on Friday. She did what Dr. Terrace ArabiaYan told her to do. Pt says that no one called her back until she called after hours. Pt spoke with the answering service after hrs. Pt would like a call back [phone]

## 2015-10-20 NOTE — Telephone Encounter (Signed)
error 

## 2015-10-20 NOTE — Addendum Note (Signed)
Addended by: Melvyn NovasHMEIER, Deneice Wack on: 10/20/2015 02:05 PM   Modules accepted: Orders

## 2015-10-20 NOTE — Telephone Encounter (Signed)
I spoke to pt and advised her that Dr. Vickey Hugerohmeier thinks pt should take 1 whole tablet of PB at bedtime. I advised her that Dr. Vickey Hugerohmeier also wants to make sure pt is getting enough sleep. I also advised pt that Dr. Vickey Hugerohmeier has given her an RX for xanax to be taken as needed for anxiety or if she notices seizure activity as needed every 6 hours and if she takes xanax, she cannot drive. Pt verbalized understanding.  [facility] [place], received a receipt of confirmation.

## 2015-10-20 NOTE — Telephone Encounter (Signed)
First of all the patient needs to get more sleep this is also a preventative step from having more seizures. Second the decrease and phenobarbital should not leave her more tired. When I speak of a preventative medicine I usually mean at 11 or Xanax for a patient that has a high level of anxiety these medications can help to reduce the risk of a seizure onset. I will be happy to provide at 20 pill supply which probably last her a year or longer. Usually choose Xanax half milligram.CD

## 2015-10-20 NOTE — Telephone Encounter (Signed)
  Pt was advised by Dr. Terrace ArabiaYan to take 1 whole tablet of phenobarbital on Friday after having 3 auras, instead of her usual 1/2 tablet at bedtime. Pt took the whole tablet and has not had any auras since. She has resumed her usual schedule of taking 1/2 tablet of phenobarbital at bedtime.  Pt wants to know if Dr. Vickey Hugerohmeier thinks she should increase the PB to 1 tablet daily or keep taking 1/2 tablet at bedtime? Pt reports that having the auras again really frightened her.  Of note, this pt is highly anxious on the phone, and I spent 10 minutes talking to her.

## 2015-12-10 ENCOUNTER — Ambulatory Visit: Payer: BLUE CROSS/BLUE SHIELD | Admitting: Nurse Practitioner

## 2015-12-16 ENCOUNTER — Encounter: Payer: Self-pay | Admitting: Nurse Practitioner

## 2015-12-16 ENCOUNTER — Ambulatory Visit (INDEPENDENT_AMBULATORY_CARE_PROVIDER_SITE_OTHER): Payer: BLUE CROSS/BLUE SHIELD | Admitting: Nurse Practitioner

## 2015-12-16 VITALS — BP 135/92 | HR 75 | Ht 64.0 in | Wt 181.8 lb

## 2015-12-16 DIAGNOSIS — R569 Unspecified convulsions: Secondary | ICD-10-CM

## 2015-12-16 DIAGNOSIS — Z5181 Encounter for therapeutic drug level monitoring: Secondary | ICD-10-CM

## 2015-12-16 DIAGNOSIS — G40309 Generalized idiopathic epilepsy and epileptic syndromes, not intractable, without status epilepticus: Secondary | ICD-10-CM

## 2015-12-16 NOTE — Progress Notes (Signed)
I agree with the assessment and plan as directed by NP .The patient is known to me .   Aidon Klemens, MD  

## 2015-12-16 NOTE — Patient Instructions (Signed)
Continue Phenobarb at current dose Will check Blood level today F/U in 6 months
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
## 2015-12-17 ENCOUNTER — Telehealth: Payer: Self-pay | Admitting: Nurse Practitioner

## 2015-12-17 DIAGNOSIS — Z5320 Procedure and treatment not carried out because of patient's decision for unspecified reasons: Secondary | ICD-10-CM

## 2015-12-17 DIAGNOSIS — Z780 Asymptomatic menopausal state: Secondary | ICD-10-CM

## 2015-12-17 DIAGNOSIS — R569 Unspecified convulsions: Secondary | ICD-10-CM

## 2015-12-17 DIAGNOSIS — Z5181 Encounter for therapeutic drug level monitoring: Secondary | ICD-10-CM

## 2015-12-17 LAB — PHENOBARBITAL LEVEL: Phenobarbital, Serum: 7 ug/mL — ABNORMAL LOW (ref 15–40)

## 2015-12-17 MED ORDER — PHENOBARBITAL 64.8 MG PO TABS: ORAL_TABLET | ORAL | Status: DC

## 2015-12-17 NOTE — Telephone Encounter (Signed)
Telephone call to patient. Phenobarbital level 7.0 which is not therapeutic. Discussed  with Dr. Vickey Hugerohmeier who suggested doubling the dose. Patient called and given that information. She will get recheck in 10 days after doubling the dose. Rx called to pharmacy

## 2016-01-15 ENCOUNTER — Other Ambulatory Visit (INDEPENDENT_AMBULATORY_CARE_PROVIDER_SITE_OTHER): Payer: Self-pay

## 2016-01-15 ENCOUNTER — Telehealth: Payer: Self-pay | Admitting: *Deleted

## 2016-01-15 DIAGNOSIS — Z0289 Encounter for other administrative examinations: Secondary | ICD-10-CM

## 2016-01-15 DIAGNOSIS — Z5181 Encounter for therapeutic drug level monitoring: Secondary | ICD-10-CM

## 2016-01-15 DIAGNOSIS — R569 Unspecified convulsions: Secondary | ICD-10-CM

## 2016-01-15 NOTE — Telephone Encounter (Signed)
PB causes drowsine So that's why it is given at night. Lets wait for the results. We may need to change dose .

## 2016-01-15 NOTE — Telephone Encounter (Signed)
Pt here having lab draw for repeat PBS level.  She has noted no more auras, no seizures, but increased drowsiness, sleepiness.  She is taking PBS ( 2 tabs of 64.8mg ) at night.  She will fall asleep sitting up if still for a time.   I will forward to CM/NP.  Relayed some acclimation for this is normal.  Last PBS level 7.0.    She has been on since seen last 12-17-15.

## 2016-01-16 LAB — PHENOBARBITAL LEVEL: PHENOBARBITAL, SERUM: 14 ug/mL — AB (ref 15–40)

## 2016-01-16 NOTE — Telephone Encounter (Signed)
I called pt and relayed the message per below.  I saw result back, at PBS 14.  I told pt result, and relayed that CM/NP will address back in Monday.  Will be back in touch next week.  Pt verbalized understanding.

## 2016-01-19 NOTE — Telephone Encounter (Signed)
I consulted Dr. Vickey Hugerohmeier.  She stated that she would stay at current dose.  Drowsiness is SE.

## 2016-01-19 NOTE — Telephone Encounter (Signed)
Telephone call to patient made her aware that I had talked to Dr.'s morning and she would like for her to continue this same dose. Phenobarbital does cause drowsiness. She can try splitting the dose to take 1 in the morning and one at night. She is agreeable to this she does not need refills

## 2016-06-17 ENCOUNTER — Ambulatory Visit (INDEPENDENT_AMBULATORY_CARE_PROVIDER_SITE_OTHER): Payer: BLUE CROSS/BLUE SHIELD | Admitting: Neurology

## 2016-06-17 ENCOUNTER — Encounter: Payer: Self-pay | Admitting: Neurology

## 2016-06-17 VITALS — BP 148/82 | HR 70 | Resp 20 | Ht 64.0 in | Wt 184.0 lb

## 2016-06-17 DIAGNOSIS — Z780 Asymptomatic menopausal state: Secondary | ICD-10-CM

## 2016-06-17 DIAGNOSIS — G40209 Localization-related (focal) (partial) symptomatic epilepsy and epileptic syndromes with complex partial seizures, not intractable, without status epilepticus: Secondary | ICD-10-CM | POA: Diagnosis not present

## 2016-06-17 DIAGNOSIS — Z5320 Procedure and treatment not carried out because of patient's decision for unspecified reasons: Secondary | ICD-10-CM | POA: Diagnosis not present

## 2016-06-17 MED ORDER — ALPRAZOLAM 0.5 MG PO TABS: 0.5000 mg | ORAL_TABLET | Freq: Every evening | ORAL | 0 refills | Status: DC | PRN

## 2016-06-17 MED ORDER — PHENOBARBITAL 64.8 MG PO TABS: ORAL_TABLET | ORAL | 1 refills | Status: DC

## 2016-06-17 NOTE — Patient Instructions (Addendum)
Do not take decongestants as these can lower the seizure threshold.  Continue phenobarbital- we will not be able to wean you off, but continue on this low dose.  I will not add another medication as you had no seizures in 7 years.
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
## 2016-06-18 LAB — COMPREHENSIVE METABOLIC PANEL
ALT: 20 IU/L (ref 0–32)
AST: 18 IU/L (ref 0–40)
Albumin/Globulin Ratio: 1.2 (ref 1.2–2.2)
Albumin: 4.3 g/dL (ref 3.5–5.5)
Alkaline Phosphatase: 113 IU/L (ref 39–117)
BUN/Creatinine Ratio: 21 (ref 9–23)
BUN: 19 mg/dL (ref 6–24)
Bilirubin Total: 0.2 mg/dL (ref 0.0–1.2)
CO2: 27 mmol/L (ref 18–29)
CREATININE: 0.92 mg/dL (ref 0.57–1.00)
Calcium: 9.5 mg/dL (ref 8.7–10.2)
Chloride: 100 mmol/L (ref 96–106)
GFR calc non Af Amer: 70 mL/min/{1.73_m2} (ref >59)
GFR, EST AFRICAN AMERICAN: 80 mL/min/{1.73_m2} (ref >59)
GLUCOSE: 88 mg/dL (ref 65–99)
Globulin, Total: 3.6 g/dL (ref 1.5–4.5)
POTASSIUM: 5.1 mmol/L (ref 3.5–5.2)
Sodium: 141 mmol/L (ref 134–144)
Total Protein: 7.9 g/dL (ref 6.0–8.5)

## 2016-06-21 ENCOUNTER — Telehealth: Payer: Self-pay

## 2016-06-21 NOTE — Telephone Encounter (Signed)
I spoke to Dr. Vickey Hugerohmeier. Dr. Vickey Hugerohmeier is asking for the CD of images to be dropped off at the office. I called pt. She is agreeable to bringing the CD by tomorrow.

## 2016-06-21 NOTE — Telephone Encounter (Signed)
-----   Message from Melvyn Novasarmen Dohmeier, MD sent at 06/18/2016  4:39 PM EST ----- Normal CMET for phenobarbitol patient. CD

## 2016-06-21 NOTE — Telephone Encounter (Signed)
I spoke to pt and advised her that per Dr. Vickey Hugerohmeier, her labs were normal. Pt verbalized understanding of results.  Pt said that she had imaging done on her brain on Friday and want to know if we received the results. I advised her that I had not seen them as of yet. Pt wants Dr. Vickey Hugerohmeier to review the results. Pt will [facility] again ask them to fax the results and pt is asking that I call her when I receive the results.

## 2016-06-21 NOTE — Telephone Encounter (Signed)
[facility] Pt is asking that Dr. Vickey Hugerohmeier review the results and call her to discuss. (Pt is concerned about "holes" in her brain). Pt can be reached at [phone].

## 2016-06-22 NOTE — Telephone Encounter (Signed)
I have given the CD ROM with her CT to Dr. Marjory LiesPenumalli. Per patients request . She flet her PCP  Reported this as an abnormal CT by phone , and is highly anxious.  I couldn't see any abnormalities, and the official report didn't speak of any.  I spoke to her on the phone and told her that Dr. Demetrius CharityP will have a look at this image today or tomorrow. She was fine with this plan. She instructed me to leave a detailed result voicemail if she is not picking up the phone.  CD

## 2016-06-22 NOTE — Telephone Encounter (Signed)
Pt dropped of the CD of images from her CT head. Pt wants Dr. Vickey Hugerohmeier to review the images and call pt to discuss.

## 2016-06-23 NOTE — Telephone Encounter (Signed)
Dr. Vickey Hugerohmeier asked me to call pt and tell her that Dr. Marjory LiesPenumalli reviewed images from her CT head and agreed with Dr. Vickey Hugerohmeier, that there was no major finding.  I called pt and advised her of this information. Pt verbalized understanding.  Pt said she will pick up the CD at the front desk sometime next week.

## 2016-07-15 ENCOUNTER — Telehealth: Payer: Self-pay | Admitting: Neurology

## 2016-07-15 NOTE — Telephone Encounter (Signed)
Do you see any problems with this on your end?

## 2016-07-15 NOTE — Telephone Encounter (Signed)
I spoke to patient and she is aware of recommendation below. She states that she is going to hold off for now. No confirmed UTI, it was given to patient as a precautionary medication.

## 2016-07-15 NOTE — Telephone Encounter (Signed)
Usually not a problem, but can interfere with phenobarb metabolism.  I think it is better to take it and watch out for side effects of agitation or lethargy. CD

## 2016-07-15 NOTE — Telephone Encounter (Signed)
Patient received a Rx for Cipro from her PCP. Is this medication alright for her to take?

## 2016-12-15 ENCOUNTER — Ambulatory Visit: Payer: BLUE CROSS/BLUE SHIELD | Admitting: Adult Health

## 2017-02-23 ENCOUNTER — Ambulatory Visit (INDEPENDENT_AMBULATORY_CARE_PROVIDER_SITE_OTHER): Payer: Self-pay | Admitting: Adult Health

## 2017-02-23 ENCOUNTER — Encounter: Payer: Self-pay | Admitting: Adult Health

## 2017-02-23 ENCOUNTER — Other Ambulatory Visit: Payer: Self-pay | Admitting: *Deleted

## 2017-02-23 VITALS — BP 129/78 | HR 77 | Wt 186.6 lb

## 2017-02-23 DIAGNOSIS — Z5320 Procedure and treatment not carried out because of patient's decision for unspecified reasons: Secondary | ICD-10-CM

## 2017-02-23 DIAGNOSIS — Z780 Asymptomatic menopausal state: Secondary | ICD-10-CM

## 2017-02-23 DIAGNOSIS — G40209 Localization-related (focal) (partial) symptomatic epilepsy and epileptic syndromes with complex partial seizures, not intractable, without status epilepticus: Secondary | ICD-10-CM

## 2017-02-23 MED ORDER — PHENOBARBITAL 64.8 MG PO TABS: 129.6000 mg | ORAL_TABLET | Freq: Every day | ORAL | 0 refills | Status: DC

## 2017-02-23 MED ORDER — PHENOBARBITAL 64.8 MG PO TABS: ORAL_TABLET | ORAL | 1 refills | Status: DC

## 2017-02-23 NOTE — Patient Instructions (Addendum)
Your Plan:  Continue Phenobarbital Have PCP send blood work to our office If you have any seizure events please let us know  [facility]. I hope we have been able to provide you high quality care today.  You may receive a patient satisfaction survey over the next few weeks. We would appreciate your feedback and comments so that we may continue to improve ourselves and the health of our patients.
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
## 2017-02-23 NOTE — Telephone Encounter (Signed)
Phenobarbital Rx  [facility] [place].

## 2017-02-25 NOTE — Progress Notes (Signed)
I agree with the assessment and plan as directed by NP .The patient is known to me .   Nasean Zapf, MD  

## 2017-08-16 ENCOUNTER — Other Ambulatory Visit: Payer: Self-pay | Admitting: Adult Health

## 2017-08-16 DIAGNOSIS — Z780 Asymptomatic menopausal state: Principal | ICD-10-CM

## 2017-08-16 DIAGNOSIS — Z5320 Procedure and treatment not carried out because of patient's decision for unspecified reasons: Secondary | ICD-10-CM

## 2017-08-17 ENCOUNTER — Other Ambulatory Visit: Payer: Self-pay | Admitting: *Deleted

## 2017-08-17 NOTE — Telephone Encounter (Signed)
P[facility] [place]

## 2017-12-16 ENCOUNTER — Other Ambulatory Visit: Payer: Self-pay | Admitting: Adult Health

## 2017-12-16 DIAGNOSIS — Z780 Asymptomatic menopausal state: Principal | ICD-10-CM

## 2017-12-16 DIAGNOSIS — Z5320 Procedure and treatment not carried out because of patient's decision for unspecified reasons: Secondary | ICD-10-CM

## 2018-01-02 ENCOUNTER — Encounter: Payer: Self-pay | Admitting: Neurology

## 2018-01-09 ENCOUNTER — Other Ambulatory Visit (HOSPITAL_COMMUNITY): Payer: Self-pay | Admitting: Family Medicine

## 2018-01-09 ENCOUNTER — Ambulatory Visit (HOSPITAL_COMMUNITY)
Admission: RE | Admit: 2018-01-09 | Discharge: 2018-01-09 | Disposition: A | Discharge status: Home / Self Care | Payer: Self-pay | Source: Ambulatory Visit | Attending: Family Medicine | Admitting: Family Medicine

## 2018-01-09 DIAGNOSIS — M25552 Pain in left hip: Secondary | ICD-10-CM

## 2018-01-09 DIAGNOSIS — Z1389 Encounter for screening for other disorder: Secondary | ICD-10-CM | POA: Insufficient documentation

## 2018-01-09 DIAGNOSIS — R202 Paresthesia of skin: Secondary | ICD-10-CM | POA: Insufficient documentation

## 2018-01-09 DIAGNOSIS — Z6832 Body mass index (BMI) 32.0-32.9, adult: Secondary | ICD-10-CM | POA: Insufficient documentation

## 2018-01-24 ENCOUNTER — Telehealth: Payer: Self-pay | Admitting: Neurology

## 2018-01-24 NOTE — Telephone Encounter (Signed)
Pt is having tingling in the left hip and left buttock since the beginning of May. Pt said she has gained about 10 pounds. She has seen PCP, has had xray to rule out tumor, has been taking ibuprofen with no help. PCP doesn't know what else to do. Phone rep advised she could talk with Dr D about it at her appt 7/22. Pt is concerned if it is ok to wait that long. Please call to advise or phone rep can call her back with the advice

## 2018-01-24 NOTE — Telephone Encounter (Signed)
Did she have a NCV and EMG? Is the tingling over the Gluteus is associated with a rash ?   I would order a NCV first if not already done. Dx: peripheral neuropathy, neuralgia

## 2018-01-25 ENCOUNTER — Other Ambulatory Visit: Payer: Self-pay | Admitting: Neurology

## 2018-01-25 DIAGNOSIS — M25552 Pain in left hip: Secondary | ICD-10-CM

## 2018-01-25 DIAGNOSIS — R202 Paresthesia of skin: Secondary | ICD-10-CM

## 2018-01-25 NOTE — Telephone Encounter (Signed)
Called the patient and discussed her symptoms with her in detail. Pt states that she cant think of any injury or anything that could have caused this to start happening. She states that its random and intermittent in how it comes and goes. The patient states that the tingling is located in left hip from anterior to posterior of hip. She wouldn't states that its necessarily in her left buttock but that some of the tingling sensation can creep into the left buttocks. There is no rash associated. The pt has a follow up apt on 7/22 and informed her that Dr Vickey Hugerohmeier would recommend a NCV and EMG to assess this further for peripheral neuropathy. Pt verbalized understanding. She wanted to know the cost as she is uninsured. I informed her that she could discuss the cost with billing prior to booking if she would like. Informed her that someone would be calling to schedule her for that test. Pt verbalized understanding.

## 2018-02-14 ENCOUNTER — Ambulatory Visit: Payer: Self-pay | Admitting: Neurology

## 2018-02-14 ENCOUNTER — Ambulatory Visit (INDEPENDENT_AMBULATORY_CARE_PROVIDER_SITE_OTHER): Payer: Self-pay | Admitting: Neurology

## 2018-02-14 DIAGNOSIS — R202 Paresthesia of skin: Secondary | ICD-10-CM

## 2018-02-14 DIAGNOSIS — M25552 Pain in left hip: Secondary | ICD-10-CM

## 2018-02-14 DIAGNOSIS — R200 Anesthesia of skin: Secondary | ICD-10-CM

## 2018-02-14 NOTE — Progress Notes (Signed)
Duliicate note opened in error

## 2018-02-14 NOTE — Progress Notes (Signed)
Full Name: Sarah Chung Gender: Female MRN #: [id number] Date of Birth: 2061-06-12    Visit Date: 02/14/18 09:14 Age: 57 Years 7 Months Old Examining Physician: Despina Ariasichard Tatelyn Vanhecke, MD  Referring Physician: Dohmeier, MD    History:  [name] is a 57 year old woman with intermittent numbness in the left upper leg/hip.  Nerve conduction studies:  The left peroneal and tibial motor responses had normal distal latencies, amplitudes and conduction velocities.  The left sural and superficial peroneal sensory responses had normal peak latencies and amplitudes.  The tibial F-wave response was normal.  Electromyography: Needle EMG of the left iliopsoas, gluteus medius, vastus medialis and tibialis anterior muscles showed normal motor unit morphology and recruitment.  There was mild chronic denervation in the medial gastrocnemius and the peroneus longus muscle on the left.  There was no abnormal spontaneous activity in any of the muscles tested.  Impression: This nerve conduction/EMG study shows the following: 1.   Minimal chronic left S1 radiculopathy without active features. 2.  There is no evidence of a superimposed neuropathy.  Sarah Smolinski A. Epimenio FootSater, MD, PhD, FAAN Certified in Neurology, Clinical Neurophysiology, Sleep Medicine, Pain Medicine and Neuroimaging Director, [facility]  [facility] [address], [address] [place], [place] [address] [phone]         [facility]    Nerve / Sites Muscle Latency Ref. Amplitude Ref. Rel Amp Segments Distance Velocity Ref. Area    ms ms mV mV %  cm m/s m/s mVms  L Peroneal - EDB     Ankle EDB 4.7 ?6.5 6.8 ?2.0 100 Ankle - EDB 9   20.7     Fib head EDB 11.3  5.9  86.8 Fib head - Ankle 30 46 ?44 19.4     Pop fossa EDB 13.4  5.7  97.1 Pop fossa - Fib head 10 48 ?44 18.9         Pop fossa - Ankle      L Tibial - AH     Ankle AH 4.9 ?5.8 7.7 ?4.0 100 Ankle - AH 9   27.2     Pop fossa AH  13.5  6.9  90.4 Pop fossa - Ankle 36 42 ?41 25.6         SNC    Nerve / Sites Rec. Site Peak Lat Ref.  Amp Ref. Segments Distance    ms ms V V  cm  L Sural - Ankle (Calf)     Calf Ankle 3.7 ?4.4 12 ?6 Calf - Ankle 14  L Superficial peroneal - Ankle     Lat leg Ankle 4.0 ?4.4 11 ?6 Lat leg - Ankle 14         F  Wave    Nerve F Lat Ref.   ms ms  L Tibial - AH 54.2 ?56.0       EMG full       EMG Summary Table    Spontaneous MUAP Recruitment  Muscle IA Fib PSW Fasc Other Amp Dur. Poly Pattern  L. Vastus medialis Normal None None None _______ Normal Normal Normal Normal  L. Tibialis anterior Normal None None None _______ Normal Normal Normal Normal  L. Peroneus longus Normal None None None _______ Normal Increased 1+ Reduced  L. Gastrocnemius (Medial head) Normal None None None _______ Normal Normal 1+ Reduced  L. Gluteus medius Normal None None None _______ Normal Normal Normal Normal  L. Iliopsoas Normal  None None None _______ Normal Normal Normal Normal

## 2018-02-27 ENCOUNTER — Encounter: Payer: Self-pay | Admitting: Neurology

## 2018-02-27 ENCOUNTER — Ambulatory Visit: Payer: Self-pay | Admitting: Neurology

## 2018-02-27 VITALS — BP 144/86 | HR 63 | Ht 64.0 in | Wt 188.0 lb

## 2018-02-27 DIAGNOSIS — Z5320 Procedure and treatment not carried out because of patient's decision for unspecified reasons: Secondary | ICD-10-CM

## 2018-02-27 DIAGNOSIS — Z780 Asymptomatic menopausal state: Secondary | ICD-10-CM

## 2018-02-27 DIAGNOSIS — G40209 Localization-related (focal) (partial) symptomatic epilepsy and epileptic syndromes with complex partial seizures, not intractable, without status epilepticus: Secondary | ICD-10-CM | POA: Insufficient documentation

## 2018-02-27 MED ORDER — MELOXICAM 7.5 MG PO TABS: ORAL_TABLET | ORAL | 1 refills | Status: DC

## 2018-02-27 MED ORDER — PHENOBARBITAL 64.8 MG PO TABS: 139.0000 mg | ORAL_TABLET | Freq: Every day | ORAL | 1 refills | Status: DC
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
## 2018-02-27 NOTE — Patient Instructions (Signed)
Meloxicam tablets What is this medicine? MELOXICAM (mel OX i cam) is a non-steroidal anti-inflammatory drug (NSAID). It is used to reduce swelling and to treat pain. It may be used for osteoarthritis, rheumatoid arthritis, or juvenile rheumatoid arthritis. This medicine may be used for other purposes; ask your health care provider or pharmacist if you have questions. COMMON BRAND NAME(S): Mobic What should I tell my health care provider before I take this medicine? They need to know if you have any of these conditions: -bleeding disorders -cigarette smoker -coronary artery bypass graft (CABG) surgery within the past 2 weeks -drink more than 3 alcohol-containing drinks per day -heart disease -high blood pressure -history of stomach bleeding -kidney disease -liver disease -lung or breathing disease, like asthma -stomach or intestine problems -an unusual or allergic reaction to meloxicam, aspirin, other NSAIDs, other medicines, foods, dyes, or preservatives -pregnant or trying to get pregnant -breast-feeding How should I use this medicine? Take this medicine by mouth with a full glass of water. Follow the directions on the prescription label. You can take it with or without food. If it upsets your stomach, take it with food. Take your medicine at regular intervals. Do not take it more often than directed. Do not stop taking except on your doctor's advice. A special MedGuide will be given to you by the pharmacist with each prescription and refill. Be sure to read this information carefully each time. Talk to your pediatrician regarding the use of this medicine in children. While this drug may be prescribed for selected conditions, precautions do apply. Patients over 65 years old may have a stronger reaction and need a smaller dose. Overdosage: If you think you have taken too much of this medicine contact a poison control center or emergency room at once. NOTE: This medicine is only for you. Do  not share this medicine with others. What if I miss a dose? If you miss a dose, take it as soon as you can. If it is almost time for your next dose, take only that dose. Do not take double or extra doses. What may interact with this medicine? Do not take this medicine with any of the following medications: -cidofovir -ketorolac This medicine may also interact with the following medications: -aspirin and aspirin-like medicines -certain medicines for blood pressure, heart disease, irregular heart beat -certain medicines for depression, anxiety, or psychotic disturbances -certain medicines that treat or prevent blood clots like warfarin, enoxaparin, dalteparin, apixaban, dabigatran, rivaroxaban -cyclosporine -digoxin -diuretics -methotrexate -other NSAIDs, medicines for pain and inflammation, like ibuprofen and naproxen -pemetrexed This list may not describe all possible interactions. Give your health care provider a list of all the medicines, herbs, non-prescription drugs, or dietary supplements you use. Also tell them if you smoke, drink alcohol, or use illegal drugs. Some items may interact with your medicine. What should I watch for while using this medicine? Tell your doctor or healthcare professional if your symptoms do not start to get better or if they get worse. Do not take other medicines that contain aspirin, ibuprofen, or naproxen with this medicine. Side effects such as stomach upset, nausea, or ulcers may be more likely to occur. Many medicines available without a prescription should not be taken with this medicine. This medicine can cause ulcers and bleeding in the stomach and intestines at any time during treatment. This can happen with no warning and may cause death. There is increased risk with taking this medicine for a long time. Smoking, drinking alcohol, older age,   and poor health can also increase risks. Call your doctor right away if you have stomach pain or blood in your  vomit or stool. This medicine does not prevent heart attack or stroke. In fact, this medicine may increase the chance of a heart attack or stroke. The chance may increase with longer use of this medicine and in people who have heart disease. If you take aspirin to prevent heart attack or stroke, talk with your doctor or health care professional. What side effects may I notice from receiving this medicine? Side effects that you should report to your doctor or health care professional as soon as possible: -allergic reactions like skin rash, itching or hives, swelling of the face, lips, or tongue -nausea, vomiting -signs and symptoms of a blood clot such as breathing problems; changes in vision; chest pain; severe, sudden headache; pain, swelling, warmth in the leg; trouble speaking; sudden numbness or weakness of the face, arm, or leg -signs and symptoms of bleeding such as bloody or black, tarry stools; red or dark-brown urine; spitting up blood or brown material that looks like coffee grounds; red spots on the skin; unusual bruising or bleeding from the eye, gums, or nose -signs and symptoms of liver injury like dark yellow or brown urine; general ill feeling or flu-like symptoms; light-colored stools; loss of appetite; nausea; right upper belly pain; unusually weak or tired; yellowing of the eyes or skin -signs and symptoms of stroke like changes in vision; confusion; trouble speaking or understanding; severe headaches; sudden numbness or weakness of the face, arm, or leg; trouble walking; dizziness; loss of balance or coordination Side effects that usually do not require medical attention (report to your doctor or health care professional if they continue or are bothersome): -constipation -diarrhea -gas This list may not describe all possible side effects. Call your doctor for medical advice about side effects. You may report side effects to FDA at 1-800-FDA-1088. Where should I keep my  medicine? Keep out of the reach of children. Store at room temperature between 15 and 30 degrees C (59 and 86 degrees F). Throw away any unused medicine after the expiration date. NOTE: This sheet is a summary. It may not cover all possible information. If you have questions about this medicine, talk to your doctor, pharmacist, or health care provider.  2018 Elsevier/Gold Standard (2015-08-27 19:28:16)  

## 2018-05-17 ENCOUNTER — Other Ambulatory Visit: Payer: Self-pay | Admitting: Neurology

## 2018-05-17 ENCOUNTER — Telehealth: Payer: Self-pay | Admitting: Neurology

## 2018-05-17 MED ORDER — MELOXICAM 7.5 MG PO TABS: ORAL_TABLET | ORAL | 1 refills | Status: DC

## 2018-05-17 NOTE — Telephone Encounter (Signed)
I have re entered the medication and sent to the pharmacy to clarify the directions for the pt.

## 2018-05-17 NOTE — Telephone Encounter (Signed)
Adria with [facility] in [place] on [address] calling to get clarification on meloxicam (MOBIC) 7.5 MG tablet.

## 2018-10-25 ENCOUNTER — Other Ambulatory Visit: Payer: Self-pay | Admitting: Neurology

## 2018-10-25 DIAGNOSIS — Z780 Asymptomatic menopausal state: Principal | ICD-10-CM

## 2018-10-25 DIAGNOSIS — Z5320 Procedure and treatment not carried out because of patient's decision for unspecified reasons: Secondary | ICD-10-CM

## 2019-02-08 ENCOUNTER — Encounter (HOSPITAL_COMMUNITY): Payer: Self-pay

## 2019-02-08 ENCOUNTER — Other Ambulatory Visit: Payer: Self-pay

## 2019-02-08 ENCOUNTER — Emergency Department (HOSPITAL_COMMUNITY): Payer: Self-pay

## 2019-02-08 ENCOUNTER — Inpatient Hospital Stay (HOSPITAL_COMMUNITY)
Admission: EM | Admit: 2019-02-08 | Discharge: 2019-02-10 | DRG: 176 | Disposition: A | Discharge status: Home / Self Care | Payer: Self-pay | Attending: Internal Medicine | Admitting: Internal Medicine

## 2019-02-08 ENCOUNTER — Emergency Department (HOSPITAL_COMMUNITY)
Admission: EM | Admit: 2019-02-08 | Discharge: 2019-02-08 | Disposition: A | Discharge status: Home / Self Care | Payer: Self-pay | Attending: Emergency Medicine | Admitting: Emergency Medicine

## 2019-02-08 ENCOUNTER — Telehealth (HOSPITAL_COMMUNITY): Payer: Self-pay | Admitting: Emergency Medicine

## 2019-02-08 DIAGNOSIS — I248 Other forms of acute ischemic heart disease: Secondary | ICD-10-CM | POA: Diagnosis present

## 2019-02-08 DIAGNOSIS — R778 Other specified abnormalities of plasma proteins: Secondary | ICD-10-CM

## 2019-02-08 DIAGNOSIS — Z1159 Encounter for screening for other viral diseases: Secondary | ICD-10-CM

## 2019-02-08 DIAGNOSIS — Z9049 Acquired absence of other specified parts of digestive tract: Secondary | ICD-10-CM

## 2019-02-08 DIAGNOSIS — I2699 Other pulmonary embolism without acute cor pulmonale: Secondary | ICD-10-CM

## 2019-02-08 DIAGNOSIS — I82431 Acute embolism and thrombosis of right popliteal vein: Secondary | ICD-10-CM | POA: Insufficient documentation

## 2019-02-08 DIAGNOSIS — Z791 Long term (current) use of non-steroidal anti-inflammatories (NSAID): Secondary | ICD-10-CM

## 2019-02-08 DIAGNOSIS — Z79899 Other long term (current) drug therapy: Secondary | ICD-10-CM | POA: Insufficient documentation

## 2019-02-08 DIAGNOSIS — Z888 Allergy status to other drugs, medicaments and biological substances status: Secondary | ICD-10-CM

## 2019-02-08 DIAGNOSIS — R7989 Other specified abnormal findings of blood chemistry: Secondary | ICD-10-CM

## 2019-02-08 DIAGNOSIS — I700 Atherosclerosis of aorta: Secondary | ICD-10-CM | POA: Diagnosis present

## 2019-02-08 DIAGNOSIS — I2510 Atherosclerotic heart disease of native coronary artery without angina pectoris: Secondary | ICD-10-CM | POA: Diagnosis present

## 2019-02-08 DIAGNOSIS — R569 Unspecified convulsions: Secondary | ICD-10-CM

## 2019-02-08 DIAGNOSIS — Z8249 Family history of ischemic heart disease and other diseases of the circulatory system: Secondary | ICD-10-CM

## 2019-02-08 DIAGNOSIS — E669 Obesity, unspecified: Secondary | ICD-10-CM | POA: Diagnosis present

## 2019-02-08 DIAGNOSIS — O2230 Deep phlebothrombosis in pregnancy, unspecified trimester: Secondary | ICD-10-CM

## 2019-02-08 DIAGNOSIS — G40909 Epilepsy, unspecified, not intractable, without status epilepticus: Secondary | ICD-10-CM | POA: Diagnosis present

## 2019-02-08 DIAGNOSIS — Z86718 Personal history of other venous thrombosis and embolism: Secondary | ICD-10-CM

## 2019-02-08 DIAGNOSIS — I10 Essential (primary) hypertension: Secondary | ICD-10-CM | POA: Diagnosis present

## 2019-02-08 DIAGNOSIS — Z882 Allergy status to sulfonamides status: Secondary | ICD-10-CM

## 2019-02-08 DIAGNOSIS — Z820 Family history of epilepsy and other diseases of the nervous system: Secondary | ICD-10-CM

## 2019-02-08 DIAGNOSIS — R0600 Dyspnea, unspecified: Secondary | ICD-10-CM

## 2019-02-08 DIAGNOSIS — Z86711 Personal history of pulmonary embolism: Secondary | ICD-10-CM | POA: Diagnosis present

## 2019-02-08 DIAGNOSIS — R0902 Hypoxemia: Secondary | ICD-10-CM | POA: Diagnosis present

## 2019-02-08 DIAGNOSIS — Z6832 Body mass index (BMI) 32.0-32.9, adult: Secondary | ICD-10-CM

## 2019-02-08 DIAGNOSIS — I2692 Saddle embolus of pulmonary artery without acute cor pulmonale: Principal | ICD-10-CM | POA: Diagnosis present

## 2019-02-08 HISTORY — DX: Other pulmonary embolism without acute cor pulmonale: I26.99

## 2019-02-08 LAB — CBC
HCT: 41.1 % (ref 36.0–46.0)
Hemoglobin: 13.3 g/dL (ref 12.0–15.0)
MCH: 30.6 pg (ref 26.0–34.0)
MCHC: 32.4 g/dL (ref 30.0–36.0)
MCV: 94.7 fL (ref 80.0–100.0)
Platelets: 204 10*3/uL (ref 150–400)
RBC: 4.34 MIL/uL (ref 3.87–5.11)
RDW: 12.8 % (ref 11.5–15.5)
WBC: 11.7 10*3/uL — ABNORMAL HIGH (ref 4.0–10.5)
nRBC: 0 % (ref 0.0–0.2)

## 2019-02-08 LAB — BASIC METABOLIC PANEL
Anion gap: 15 (ref 5–15)
BUN: 23 mg/dL — ABNORMAL HIGH (ref 6–20)
CO2: 24 mmol/L (ref 22–32)
Calcium: 9.2 mg/dL (ref 8.9–10.3)
Chloride: 101 mmol/L (ref 98–111)
Creatinine, Ser: 0.99 mg/dL (ref 0.44–1.00)
GFR calc Af Amer: >60 mL/min (ref >60)
GFR calc non Af Amer: >60 mL/min (ref >60)
Glucose, Bld: 129 mg/dL — ABNORMAL HIGH (ref 70–99)
Potassium: 3.9 mmol/L (ref 3.5–5.1)
Sodium: 140 mmol/L (ref 135–145)

## 2019-02-08 LAB — TROPONIN I (HIGH SENSITIVITY): Troponin I (High Sensitivity): 229 ng/L (ref <18)

## 2019-02-08 MED ORDER — IOHEXOL 350 MG/ML SOLN
100.0000 mL | Freq: Once | INTRAVENOUS | Status: AC | PRN
  Administered 2019-02-08: 100 mL via INTRAVENOUS

## 2019-02-08 MED ORDER — RIVAROXABAN (XARELTO) EDUCATION KIT FOR DVT/PE PATIENTS: PACK | 0 refills | Status: DC

## 2019-02-08 MED ORDER — RIVAROXABAN (XARELTO) VTE STARTER PACK (15 & 20 MG): 15.0000 mg | ORAL_TABLET | Freq: Two times a day (BID) | ORAL | 0 refills | Status: DC

## 2019-02-08 NOTE — ED Triage Notes (Signed)
Pt was seen here earlier and dx with popliteal dvt on the right side. Pt now having chest pain and sob.

## 2019-02-08 NOTE — Telephone Encounter (Signed)
I was contacted by pharmacy to see if there is another medicine besides Xarelto because the patient complained to the pharmacist that she cannot afford it.  She is being treated for a lower leg DVT, popliteal vein and distally.  An order for Xarelto education kit was placed by me, at this time.

## 2019-02-08 NOTE — Discharge Instructions (Addendum)
Take medication as directed.  Follow-up with [facility].  Return for any shortness of breath or chest pain.

## 2019-02-08 NOTE — ED Triage Notes (Addendum)
Pt reports right calf pain for 2-3 days. Reports hx of DVT 1993. No blood thinners. Feels that right leg is throbbing. Pt reports moving furniture several days ago

## 2019-02-08 NOTE — ED Provider Notes (Addendum)
[facility] EMERGENCY DEPARTMENT Provider Note   CSN: [id number] Arrival date & time: 02/08/19  [id number]     History   Chief Complaint Chief Complaint  Patient presents with  . Leg Pain    HPI Delphina CahillRose M Knapke is a 58 y.o. female.     Patient with a complaint of right knee pain.  Predominantly behind the knee.  No chest pain no shortness of breath no fevers no upper respiratory symptoms.  Patient did have a deep vein thrombosis during pregnancy in the 1990s.  None since.  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  Patient did not denies any chest pain or shortness of breath.  Pain came on after helping her son move a mattress.  This was a few days ago.  No history of injury.     Past Medical History:  Diagnosis Date  . DVT (deep vein thrombosis) in pregnancy   . Gallstones   . Seizures [facility](HCC)     Patient Active Problem List   Diagnosis Date Noted  . Partial symptomatic epilepsy with complex partial seizures, not intractable, without status epilepticus (HCC) 02/27/2018  . Numbness 02/14/2018  . 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 Menopause present, declines hormone replacement therapy 06/17/2016  . Seizures (HCC) 01/09/2014  . Medication monitoring encounter 01/09/2014  . Calculus of gallbladder with acute cholecystitis, without mention of obstruction 07/03/2013  . 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 Encounter for therapeutic drug monitoring 12/20/2012    Past Surgical History:  Procedure Laterality Date  . CHOLECYSTECTOMY N/A 07/02/2013   Procedure: LAPAROSCOPIC CHOLECYSTECTOMY WITH INTRAOPERATIVE CHOLANGIOGRAM;  Surgeon: Ernestene MentionHaywood M Ingram, MD;  Location: [facility] ORS;  Service: General;  Laterality: N/A;     OB History   No obstetric history on file.      Home Medications    Prior to Admission medications   Medication Sig Start Date End Date  Taking? Authorizing Provider  ALPRAZolam Prudy Feeler(XANAX) 0.5 MG tablet Take 1 tablet (0.5 mg total) by mouth at bedtime as needed for anxiety. As needed for seizure activity- anxiety ,  Do not drive for 6 hours after medication. 06/17/16   Dohmeier, Porfirio Mylararmen, MD  meloxicam (MOBIC) 7.5 MG tablet Patient may take one tablet daily as needed for hip and neck discomfort 05/17/18   Dohmeier, Porfirio Mylararmen, MD  PHENobarbital (LUMINAL) 64.8 MG tablet TAKE 2 TABLETS BY MOUTH AT BEDTIME 10/25/18   Dohmeier, Porfirio Mylararmen, MD  phentermine (ADIPEX-P) 37.5 MG tablet TK 1 T PO QD 02/12/18   [provider]  Rivaroxaban 15 & 20 MG TBPK Take 15 mg by mouth 2 (two) times a day. Take as directed on package: Start with one 15mg  tablet by mouth twice a day with food. On Day 22, switch to one 20mg  tablet once a day with food. 02/08/19   Vanetta MuldersZackowski, Anees Vanecek, MD    Family History Family History  Problem Relation Age of Onset  . Heart attack Father   . Parkinson's disease Father   . Heart disease Brother     Social History Social History   Tobacco Use  . Smoking status: Never Smoker  . Smokeless tobacco: Never Used  Substance Use Topics  . Alcohol use: No  . Drug use: No     Allergies   Tegretol [carbamazepine], Keppra [levetiracetam], and Sulfa antibiotics   Review of Systems Review of Systems  Constitutional: Negative for chills and fever.  HENT: Negative for congestion, rhinorrhea and sore throat.   Eyes: Negative for visual disturbance.  Respiratory: Negative for cough and shortness of breath.   Cardiovascular: Negative for chest pain and leg swelling.  Gastrointestinal: Negative for abdominal pain, diarrhea, nausea and vomiting.  Genitourinary: Negative for dysuria.  Musculoskeletal: Positive for joint swelling. Negative for back pain and neck pain.  Skin: Negative for rash.  Neurological: Negative for dizziness, light-headedness and headaches.  Hematological: Does not bruise/bleed easily.  Psychiatric/Behavioral:  Negative for confusion.     Physical Exam Updated Vital Signs BP (!) 152/96 (BP Location: Left Arm)   Pulse 78   Temp 98 F (36.7 C) (Oral)   Resp 18   Ht 1.626 m (5\' 4" )   Wt 86.6 kg   SpO2 98%   BMI 32.79 kg/m   Physical Exam Vitals signs and nursing note reviewed.  Constitutional:      General: She is not in acute distress.    Appearance: She is well-developed.  HENT:     Head: Normocephalic and atraumatic.  Eyes:     Extraocular Movements: Extraocular movements intact.     Conjunctiva/sclera: Conjunctivae normal.     Pupils: Pupils are equal, round, and reactive to light.  Neck:     Musculoskeletal: Normal range of motion and neck supple.  Cardiovascular:     Rate and Rhythm: Normal rate and regular rhythm.     Heart sounds: No murmur.  Pulmonary:     Effort: Pulmonary effort is normal. No respiratory distress.     Breath sounds: Normal breath sounds.  Abdominal:     Palpations: Abdomen is soft.     Tenderness: There is no abdominal tenderness.  Musculoskeletal:        General: Tenderness present. No swelling.     Comments: Patient without any swelling to the right lower extremity.  Did have some discomfort to palpation behind the right knee.  Neurovascularly intact distally.  Skin:    General: Skin is warm and dry.     Capillary Refill: Capillary refill takes less than 2 seconds.  Neurological:     General: No focal deficit present.     Mental Status: She is alert and oriented to person, place, and time.      ED Treatments / Results  Labs (all labs ordered are listed, but only abnormal results are displayed) Labs Reviewed - No data to display  EKG None  Radiology US Venous Img Lower Right (dvt Study)  Result Date: 02/08/2019 CLINICAL DATA:  58 year old female with right calf pain for the past 2-3 days EXAM: RIGHT LOWER EXTREMITY VENOUS DOPPLER ULTRASOUND TECHNIQUE: Gray-scale sonography with graded compression, as well as color Doppler and duplex  ultrasound were performed to evaluate the lower extremity deep venous systems from the level of the common femoral vein and including the common femoral, femoral, profunda femoral, popliteal and calf veins including the posterior tibial, peroneal and gastrocnemius veins when visible. The superficial great saphenous vein was also interrogated. Spectral Doppler was utilized to evaluate flow at rest and with distal augmentation maneuvers in the common femoral, femoral and popliteal veins. COMPARISON:  None. FINDINGS: Contralateral Common Femoral Vein: Respiratory phasicity is normal and symmetric with the symptomatic side. No evidence of thrombus. Normal compressibility. Common Femoral Vein: No evidence of thrombus. Normal compressibility, respiratory phasicity and response to augmentation. Saphenofemoral Junction: No evidence of thrombus. Normal compressibility and flow on color Doppler imaging. Profunda Femoral Vein: No evidence of thrombus. Normal compressibility and flow  on color Doppler imaging. Femoral Vein: No evidence of thrombus. Normal compressibility, respiratory phasicity and response to augmentation. Popliteal Vein: Abnormal. The vessel is not compressible and there are low-level echoes throughout the vascular lumen. No convincing flow on color Doppler imaging. There is some transmitted pulsatility from the adjacent popliteal artery. Calf Veins: Occlusive thrombus extends into the posterior tibial and peroneal veins. Superficial Great Saphenous Vein: No evidence of thrombus. Normal compressibility. Venous Reflux:  None. Other Findings:  None. IMPRESSION: Positive for acute occlusive deep venous thrombosis in the popliteal and calf veins. Electronically Signed   By: Malachy MoanHeath  McCullough M.D.   On: 02/08/2019 10:04   Dg Knee Complete 4 Views Right  Result Date: 02/08/2019 CLINICAL DATA:  Right knee pain since Monday evening. EXAM: RIGHT KNEE - COMPLETE 4+ VIEW COMPARISON:  None. FINDINGS: The joint spaces  are maintained. No acute bony findings or osteochondral lesion. No erosive changes or chondrocalcinosis. No joint effusion. IMPRESSION: Normal right knee radiographs. Electronically Signed   By: Rudie MeyerP.  Gallerani M.D.   On: 02/08/2019 10:05    Procedures Procedures (including critical care time)  Medications Ordered in ED Medications - No data to display   Initial Impression / Assessment and Plan / ED Course  I have reviewed the triage vital signs and the nursing notes.  Pertinent labs & imaging results that were available during my care of the patient were reviewed by me and considered in my medical decision making (see chart for details).       DBD studies showed evidence of a popliteal deep vein thrombosis on the right side.  X-rays of the knee showed no bony abnormalities or arthritis.  Patient will be started on Xarelto for the deep vein thrombosis.  She will follow-up with [facility] who she prefers to follow-up with.  She will return for any shortness of breath or chest pain.  Patient's complete metabolic panels have been normal in the past in particular normal renal function.  Patient without any history of any bleeding.    Final Clinical Impressions(s) / ED Diagnoses   Final diagnoses:  Acute deep vein thrombosis (DVT) of popliteal vein of right lower extremity [facility](HCC)    ED Discharge Orders         Ordered    Rivaroxaban 15 & 20 MG TBPK  2 times daily     02/08/19 1140           Vanetta MuldersZackowski, Jabir Dahlem, MD 02/08/19 1156    Vanetta MuldersZackowski, Armin Yerger, MD 02/08/19 1157

## 2019-02-08 NOTE — ED Notes (Signed)
CRITICAL VALUE ALERT  Critical Value:  Troponin 229  Date & Time Notied:  02/08/19 2300  Provider Notified: [email or web address]  Orders Received/Actions taken: md notified

## 2019-02-08 NOTE — ED Notes (Signed)
Patient transported to X-ray 

## 2019-02-08 NOTE — ED Provider Notes (Addendum)
[facility] EMERGENCY DEPARTMENT Provider Note   CSN: [id number] Arrival date & time: 02/08/19  2146    History   Chief Complaint Chief Complaint  Patient presents with  . Chest Pain  . Shortness of Breath    HPI Sarah Chung is a 58 y.o. female.     HPI She presents for evaluation of chest pain shortness of breath.  Seen earlier today and diagnosed with right lower leg DVT.  She had trouble getting the prescription of Xarelto because of its expense.  Earlier this evening I ordered a Xarelto education kit, which includes a one-month coupon for Xarelto.  Charge nurse at that time reported to me that the patient came and picked it up.  At this time the patient states that she began to feel shortness of breath, after she took her first dose of Xarelto, and was walking into a restaurant tonight to get some dinner.  She thought this was unusual, and decided to return here to be checked.  She states that her breathing feels "heavy," in the center of her chest.  She is not having active chest pain at this time.  She denies cough, shortness of breath, fever, chills, weakness or dizziness.  She continues have some soreness in her right lower leg, especially behind her knee.  She has a history of prior DVT.  There are no other known modifying factors.   Past Medical History:  Diagnosis Date  . DVT (deep vein thrombosis) in pregnancy   . Gallstones   . Seizures [facility])     Patient Active Problem List   Diagnosis Date Noted  . Partial symptomatic epilepsy with complex partial seizures, not intractable, without status epilepticus ([place]) 02/27/2018  . Numbness 02/14/2018  . 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 Menopause present, declines hormone replacement therapy 06/17/2016  . Seizures ([place]) 01/09/2014  . Medication monitoring encounter 01/09/2014  . Calculus of gallbladder with acute cholecystitis, without mention of obstruction  07/03/2013  . 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 Encounter for therapeutic drug monitoring 12/20/2012    Past Surgical History:  Procedure Laterality Date  . CHOLECYSTECTOMY N/A 07/02/2013   Procedure: LAPAROSCOPIC CHOLECYSTECTOMY WITH INTRAOPERATIVE CHOLANGIOGRAM;  Surgeon: Adin Hector, MD;  Location: [facility] ORS;  Service: General;  Laterality: N/A;     OB History   No obstetric history on file.      Home Medications    Prior to Admission medications   Medication Sig Start Date End Date Taking? Authorizing Provider  ALPRAZolam Duanne Moron) 0.5 MG tablet Take 1 tablet (0.5 mg total) by mouth at bedtime as needed for anxiety. As needed for seizure activity- anxiety ,  Do not drive for 6 hours after medication. 06/17/16  Yes Dohmeier, Asencion Partridge, MD  ibuprofen (ADVIL) 200 MG tablet Take 400 mg by mouth daily as needed for mild pain or moderate pain.   Yes [provider]  PHENobarbital (LUMINAL) 64.8 MG tablet TAKE 2 TABLETS BY MOUTH AT BEDTIME Patient taking differently: Take 139.6 mg by mouth at bedtime.  10/25/18  Yes Dohmeier, Asencion Partridge, MD  phentermine (ADIPEX-P) 37.5 MG tablet Take 37.5 mg by mouth daily as needed (to curve appetite).  02/12/18  Yes [provider]  Rivaroxaban 15 & 20 MG TBPK Take 15 mg by mouth 2 (two) times a day. Take as directed on package: Start with one 31m tablet by mouth twice a day with food. On Day 22, switch to one  19m tablet once a day with food. Patient taking differently: Take 15-20 mg by mouth See admin instructions. Take as directed on package: Start with one 162mtablet by mouth twice a day with food. On Day 22, switch to one 2048mablet once a day with food. 02/08/19  Yes ZacFredia SorrowD  rivaroxaban (XAAlveda ReasonsIT Utilize this kit to help you learn about Xarelto treatment, and receive a coupon to pay for the first month. Patient not taking: Reported on 02/08/2019 02/08/19   WenDaleen BoD    Family History Family  History  Problem Relation Age of Onset  . Heart attack Father   . Parkinson's disease Father   . Heart disease Brother     Social History Social History   Tobacco Use  . Smoking status: Never Smoker  . Smokeless tobacco: Never Used  Substance Use Topics  . Alcohol use: No  . Drug use: No     Allergies   Tegretol [carbamazepine], Keppra [levetiracetam], and Sulfa antibiotics   Review of Systems Review of Systems  All other systems reviewed and are negative.    Physical Exam Updated Vital Signs Ht _0  (1.626 m)   Wt 86.6 kg   BMI 32.77 kg/m   Physical Exam Vitals signs and nursing note reviewed.  Constitutional:      General: She is not in acute distress.    Appearance: She is well-developed. She is obese. She is not ill-appearing, toxic-appearing or diaphoretic.  HENT:     Head: Normocephalic and atraumatic.  Eyes:     Conjunctiva/sclera: Conjunctivae normal.     Pupils: Pupils are equal, round, and reactive to light.  Neck:     Musculoskeletal: Normal range of motion and neck supple.     Trachea: Phonation normal.  Cardiovascular:     Rate and Rhythm: Normal rate and regular rhythm.  Pulmonary:     Effort: Pulmonary effort is normal. No respiratory distress.     Breath sounds: Normal breath sounds. No stridor. No rhonchi.  Chest:     Chest wall: No tenderness.  Abdominal:     General: There is no distension.     Palpations: Abdomen is soft.     Tenderness: There is no abdominal tenderness. There is no guarding.  Musculoskeletal: Normal range of motion.     Comments: Mild right lower leg swelling as compared to the left.  No palpable popliteal mass, of the right side.  Skin:    General: Skin is warm and dry.  Neurological:     Mental Status: She is alert and oriented to person, place, and time.     Motor: No abnormal muscle tone.  Psychiatric:        Mood and Affect: Mood normal.        Behavior: Behavior normal.        Thought Content: Thought  content normal.        Judgment: Judgment normal.      ED Treatments / Results  Labs (all labs ordered are listed, but only abnormal results are displayed) Labs Reviewed  BASIC METABOLIC PANEL - Abnormal; Notable for the following components:      Result Value   Glucose, Bld 129 (*)    BUN 23 (*)    All other components within normal limits  CBC - Abnormal; Notable for the following components:   WBC 11.7 (*)    All other components within normal limits  TROPONIN I (HIGH SENSITIVITY) - Abnormal; Notable for  the following components:   Troponin I (High Sensitivity) 229.00 (*)    All other components within normal limits  TROPONIN I (HIGH SENSITIVITY)  POC URINE PREG, ED    EKG EKG Interpretation  Date/Time:  Thursday February 08 2019 22:00:51 EDT Ventricular Rate:  108 PR Interval:    QRS Duration: 88 QT Interval:  342 QTC Calculation: 459 R Axis:   56 Text Interpretation:  Sinus tachycardia Probable left atrial enlargement Since last tracing rate faster Confirmed by Daleen Bo [phone]) on 02/08/2019 10:03:25 PM   Radiology US Venous Img Lower Right (dvt Study)  Result Date: 02/08/2019 CLINICAL DATA:  58 year old female with right calf pain for the past 2-3 days EXAM: RIGHT LOWER EXTREMITY VENOUS DOPPLER ULTRASOUND TECHNIQUE: Gray-scale sonography with graded compression, as well as color Doppler and duplex ultrasound were performed to evaluate the lower extremity deep venous systems from the level of the common femoral vein and including the common femoral, femoral, profunda femoral, popliteal and calf veins including the posterior tibial, peroneal and gastrocnemius veins when visible. The superficial great saphenous vein was also interrogated. Spectral Doppler was utilized to evaluate flow at rest and with distal augmentation maneuvers in the common femoral, femoral and popliteal veins. COMPARISON:  None. FINDINGS: Contralateral Common Femoral Vein: Respiratory phasicity is  normal and symmetric with the symptomatic side. No evidence of thrombus. Normal compressibility. Common Femoral Vein: No evidence of thrombus. Normal compressibility, respiratory phasicity and response to augmentation. Saphenofemoral Junction: No evidence of thrombus. Normal compressibility and flow on color Doppler imaging. Profunda Femoral Vein: No evidence of thrombus. Normal compressibility and flow on color Doppler imaging. Femoral Vein: No evidence of thrombus. Normal compressibility, respiratory phasicity and response to augmentation. Popliteal Vein: Abnormal. The vessel is not compressible and there are low-level echoes throughout the vascular lumen. No convincing flow on color Doppler imaging. There is some transmitted pulsatility from the adjacent popliteal artery. Calf Veins: Occlusive thrombus extends into the posterior tibial and peroneal veins. Superficial Great Saphenous Vein: No evidence of thrombus. Normal compressibility. Venous Reflux:  None. Other Findings:  None. IMPRESSION: Positive for acute occlusive deep venous thrombosis in the popliteal and calf veins. Electronically Signed   By: Jacqulynn Cadet M.D.   On: 02/08/2019 10:04   Dg Knee Complete 4 Views Right  Result Date: 02/08/2019 CLINICAL DATA:  Right knee pain since Monday evening. EXAM: RIGHT KNEE - COMPLETE 4+ VIEW COMPARISON:  None. FINDINGS: The joint spaces are maintained. No acute bony findings or osteochondral lesion. No erosive changes or chondrocalcinosis. No joint effusion. IMPRESSION: Normal right knee radiographs. Electronically Signed   By: Marijo Sanes M.D.   On: 02/08/2019 10:05     Procedures .Critical Care Performed by: Daleen Bo, MD Authorized by: Daleen Bo, MD   Critical care provider statement:    Critical care time (minutes):  35   Critical care start time:  02/08/2019 9:55 PM   Critical care end time:  02/08/2019 11:21 PM   Critical care time was exclusive of:  Separately billable procedures  and treating other patients   Critical care was necessary to treat or prevent imminent or life-threatening deterioration of the following conditions:  Respiratory failure   Critical care was time spent personally by me on the following activities:  Blood draw for specimens, development of treatment plan with patient or surrogate, discussions with consultants, evaluation of patient's response to treatment, examination of patient, obtaining history from patient or surrogate, ordering and performing treatments and interventions, ordering and  review of laboratory studies, pulse oximetry, re-evaluation of patient's condition, review of old charts and ordering and review of radiographic studies   (including critical care time)  Medications Ordered in ED Medications - No data to display   Initial Impression / Assessment and Plan / ED Course  I have reviewed the triage vital signs and the nursing notes.  Pertinent labs & imaging results that were available during my care of the patient were reviewed by me and considered in my medical decision making (see chart for details).  Clinical Course as of Feb 08 2320  Thu Feb 08, 2019  2314 Elevated  Troponin I (High Sensitivity)(!!) [EW]  2314 Normal except white count high  CBC(!) [EW]  2314 Normal except glucose high, BUN high  Basic metabolic panel(!) [EW]    Clinical Course User Index [EW] Daleen Bo, MD        Patient Vitals for the past 24 hrs:  Height Weight  02/08/19 2156 _0  (1.626 m) 86.6 kg      Medical Decision Making: No DVT with abrupt onset of shortness of breath this evening, concerning for PE.  On arrival oxygenation is normal she is not in respiratory distress.  Patient has had a single dose of Xarelto, beginning tonight, after diagnosis of DVT earlier today.  CT angiogram chest ordered to evaluate for PE.  Initial troponin slightly elevated with EKG reassuring, doubt cardiac ischemia or infarct.  Patient is  hemodynamically stable, likely will not need aggressive management, unless her clinical condition deteriorates.  CRITICAL CARE-yes Performed by: Daleen Bo  Nursing Notes Reviewed/ Care Coordinated Applicable Imaging Reviewed Interpretation of Laboratory Data incorporated into ED treatment  Plan- Disposition by oncoming provider team following return of CT imaging.  Final Clinical Impressions(s) / ED Diagnoses   Final diagnoses:  Dyspnea, unspecified type  DVT (deep vein thrombosis) in pregnancy  Elevated troponin    ED Discharge Orders    None       Daleen Bo, MD 02/08/19 2316    Daleen Bo, MD 02/08/19 2321

## 2019-02-09 ENCOUNTER — Encounter (HOSPITAL_COMMUNITY): Payer: Self-pay | Admitting: Internal Medicine

## 2019-02-09 ENCOUNTER — Inpatient Hospital Stay (HOSPITAL_COMMUNITY): Payer: Self-pay

## 2019-02-09 DIAGNOSIS — I82461 Acute embolism and thrombosis of right calf muscular vein: Secondary | ICD-10-CM

## 2019-02-09 DIAGNOSIS — R569 Unspecified convulsions: Secondary | ICD-10-CM

## 2019-02-09 DIAGNOSIS — I2692 Saddle embolus of pulmonary artery without acute cor pulmonale: Principal | ICD-10-CM

## 2019-02-09 DIAGNOSIS — I2699 Other pulmonary embolism without acute cor pulmonale: Secondary | ICD-10-CM

## 2019-02-09 DIAGNOSIS — R0902 Hypoxemia: Secondary | ICD-10-CM

## 2019-02-09 DIAGNOSIS — Z86711 Personal history of pulmonary embolism: Secondary | ICD-10-CM | POA: Diagnosis present

## 2019-02-09 LAB — SARS CORONAVIRUS 2 BY RT PCR (HOSPITAL ORDER, PERFORMED IN ~~LOC~~ HOSPITAL LAB): SARS Coronavirus 2: NEGATIVE

## 2019-02-09 LAB — HIV ANTIBODY (ROUTINE TESTING W REFLEX): HIV Screen 4th Generation wRfx: NONREACTIVE

## 2019-02-09 LAB — ECHOCARDIOGRAM COMPLETE
Height: 64 in
Weight: 3054.69 oz

## 2019-02-09 LAB — PROTIME-INR
INR: 1.8 — ABNORMAL HIGH (ref 0.8–1.2)
Prothrombin Time: 20.8 seconds — ABNORMAL HIGH (ref 11.4–15.2)

## 2019-02-09 LAB — APTT
aPTT: 38 seconds — ABNORMAL HIGH (ref 24–36)
aPTT: 63 seconds — ABNORMAL HIGH (ref 24–36)
aPTT: 64 seconds — ABNORMAL HIGH (ref 24–36)

## 2019-02-09 LAB — HEPARIN LEVEL (UNFRACTIONATED): Heparin Unfractionated: 2 IU/mL — ABNORMAL HIGH (ref 0.30–0.70)

## 2019-02-09 LAB — MRSA PCR SCREENING: MRSA by PCR: NEGATIVE

## 2019-02-09 LAB — TROPONIN I (HIGH SENSITIVITY): Troponin I (High Sensitivity): 263 ng/L (ref <18)

## 2019-02-09 MED ORDER — HEPARIN (PORCINE) 25000 UT/250ML-% IV SOLN
1650.0000 [IU]/h | INTRAVENOUS | Status: DC
  Administered 2019-02-09: 1450 [IU]/h via INTRAVENOUS
  Administered 2019-02-09: 01:00:00 1300 [IU]/h via INTRAVENOUS
  Administered 2019-02-10: 1650 [IU]/h via INTRAVENOUS
  Filled 2019-02-09 (×3): qty 250

## 2019-02-09 MED ORDER — PHENOBARBITAL 32.4 MG PO TABS
129.6000 mg | ORAL_TABLET | Freq: Every day | ORAL | Status: DC
  Filled 2019-02-09: qty 4

## 2019-02-09 NOTE — Progress Notes (Signed)
RN went into patient's room to give pt ordered phenobarbital tablets 129.6 mg. Pt stated, "I have already taken my own." RN asked, "Where did you get your own from?" Pt held up prescription bottle for phenobarbital and stated, "They told me I could take it as long as I have the bottle." RN educated patient on hospital policy on patients not being allowed to keep or take home medications in their room. RN notified charge nurse, counted medication in front of patient, and took medication to pharmacy with appropriate form. Will continue to monitor pt.

## 2019-02-09 NOTE — ED Provider Notes (Signed)
Patient signed out to me by Dr. Eulis Foster to follow-up on CT angiography of the chest.  Patient previously diagnosed with DVT, got first dose of Xarelto tonight at 8 PM.  Came back to the ER with complaints of shortness of breath and chest tightness.  CT scan has been performed and does show evidence of saddle embolus with some evidence of heart strain.  This accounts for the elevated troponin.  Patient is not in any distress at this time.  She is breathing comfortably, oxygen saturations are low 90s.  Will require some supplemental oxygen and close monitoring, will admit to hospital.  Discussed with pharmacist Jonelle Sidle) given that the patient already had a dose of Xarelto tonight.  Pharmacy will determine dosing of heparin including when to initiate.   Orpah Greek, MD 02/09/19 0140

## 2019-02-09 NOTE — Progress Notes (Addendum)
ANTICOAGULATION CONSULT NOTE - Preliminary  Pharmacy Consult for heparin Indication: pulmonary embolus  Allergies  Allergen Reactions  . Tegretol [Carbamazepine] Hives  . Keppra [Levetiracetam]     Feels outside her body.[name]  . Sulfa Antibiotics Hives    Patient Measurements: Height: 5\' 4"  (162.6 cm) Weight: 190 lb 14.7 oz (86.6 kg) IBW/kg (Calculated) : 54.7 HEPARIN DW (KG): 73.8   Vital Signs:    Labs: Recent Labs    02/08/19 2200  HGB 13.3  HCT 41.1  PLT 204  CREATININE 0.99   Estimated Creatinine Clearance: 65.2 mL/min (by C-G formula based on SCr of 0.99 mg/dL).  Medical History: Past Medical History:  Diagnosis Date  . DVT (deep vein thrombosis) in pregnancy   . Gallstones   . Seizures ([place])     Medications:  (Not in a hospital admission)  Scheduled:   Infusions:  . heparin      Assessment: 58 yo female in ED with SOB and chest pain.  Was seen earlier on 7/2 for leg swelling and diagnosed with lower leg DVT.  Patient started Xarelto and took 15mg  dose at 2000 on 7/2.  After this she became SOB with chest pain and came to ED.  Pharmacy was consulted on starting heparin for newly diagnosed saddle PE.  Because patient took Xarelto at 2000, will not give bolus, but will go ahead and start heparin gtt at 18 units/kg/hr.  Will get 6 hr heparin level.  Baseline PTT and HL ordered.  Based on baseline HL, if low will consider giving a bolus of heparin from drip.    Goal of Therapy:  Heparin level 0.3-0.7 units/ml   Plan:  Start heparin infusion at 1300 units/hr Check anti-Xa level in 6 hours and daily while on heparin Continue to monitor H&H and platelets Preliminary review of pertinent patient information completed.  Forestine Na clinical pharmacist will complete review during morning rounds to assess the patient and finalize treatment regimen.  Nyra Capes, [facility] 02/09/2019,12:18 AM  Addendum: 0208 02/09/19 APTT=38, HL=2.0 confirming she did  take her Xarelto.  Due to baseline elevated HL that does not correlate with aPTT, will titrate heparin drip using aPTT only.  Will continue to get daily HL and when HL correlates with aPTT, will use HL to titrate heparin drip.

## 2019-02-09 NOTE — Progress Notes (Signed)
[place] for heparin Indication: pulmonary embolus  Allergies  Allergen Reactions  . Tegretol [Carbamazepine] Hives  . Keppra [Levetiracetam]     Feels outside her body.[name]  . Sulfa Antibiotics Hives    Patient Measurements: Height: 5\' 4"  (162.6 cm) Weight: 190 lb 14.7 oz (86.6 kg) IBW/kg (Calculated) : 54.7 HEPARIN DW (KG): 73.8  Vital Signs: Temp: 98.3 F (36.8 C) (07/03 1650) Temp Source: Oral (07/03 1650) BP: 129/88 (07/03 1650) Pulse Rate: 85 (07/03 1700)  Labs: Recent Labs    02/08/19 2200 02/09/19 0011 02/09/19 0909 02/09/19 1657  HGB 13.3  --   --   --   HCT 41.1  --   --   --   PLT 204  --   --   --   APTT  --  38* 63* 64*  LABPROT  --  20.8*  --   --   INR  --  1.8*  --   --   HEPARINUNFRC  --  2.00*  --   --   CREATININE 0.99  --   --   --    Estimated Creatinine Clearance: 65.2 mL/min (by C-G formula based on SCr of 0.99 mg/dL).   Assessment: 58 yo female in ED with SOB and chest pain.  Was seen earlier on 7/2 for leg swelling and diagnosed with lower leg DVT.  Patient started Xarelto and took 15mg  dose at 2000 on 7/2. Returned with a PE and Pharmacy consulted for IV heparin dosing.  Currently using aPTT to guide heparin dosing since Xarelto can falsely elevate heparin levels.  APTT remains sub-therapeutic.  No issue with heparin infusion nor bleeding per RN.  Goal of Therapy:  APTT goal 66-102 secs Heparin level 0.3-0.7 units/ml   Plan:  Increase heparin gtt to 1650 units/hr Check 6 hr aPTT  Ibrahim Mcpheeters D. Mina Marble, PharmD, BCPS, [place] 02/09/2019, 5:37 PM

## 2019-02-09 NOTE — Progress Notes (Signed)
Pt having new onset of 4/10 R upper thigh aching pain while ambulating.  Pain 0/10 when resting.  MD notified.

## 2019-02-09 NOTE — Consult Note (Signed)
NAME:  Sarah Chung, MRN:  [id number], DOB:  1961/01/03, LOS: 0 ADMISSION DATE:  02/08/2019, CONSULTATION DATE:  02/09/2019 REFERRING MD:  TRH Karleen Hampshire, CHIEF COMPLAINT:  PE and DVT   Brief History   58 yo F with Pulmonary Embolus of indeterminate risk. PCCM asked to consult regarding lytics.   History of present illness   58 yo F Hx gestational VTE 1993, seizure disorder who presented to [facility] 02/08/2019 with RLE pain. Pain began following moving a mattress with her son on 6/29. In [facility] ED, patient discovered to have RLE DVT and was started on  Xarelto. Patient presents to [facility] 7/2 due to shortness of breath. Patient states SOB began two days prior to presentation, worse with exertion, and states that she had associated mild chest pain which began 7/1.  CTA reveals acute PE with bilateral clot burden. On CTA there is some suggestion of heart strain. Patient admitted to hospitalist service and started on heparin gtt.  ECHO is pending.   PCCM consulted for evaluation for lysis.   Past Medical History  Seizure  Significant Hospital Events   None  Consults:  PCCM  Procedures:  N/A  Significant Diagnostic Tests:  CTA that I reviewed myself with bilateral PEs noted 7/2 DVT study with right sided popliteal and calf veins with no mobile component  Micro Data:  N/A  Antimicrobials:  N/A   Interim history/subjective:  No SOB on rest No complaints  Objective   Blood pressure (!) 132/92, pulse 96, temperature 98.7 F (37.1 C), temperature source Oral, resp. rate 17, height _0  (1.626 m), weight 86.6 kg, SpO2 95 %.        Intake/Output Summary (Last 24 hours) at 02/09/2019 1438 Last data filed at 02/09/2019 0900 Gross per 24 hour  Intake 240 ml  Output -  Net 240 ml   Filed Weights   02/08/19 2156  Weight: 86.6 kg   Examination: General: Well appearing, NAD HENT: [place]/AT, PERRL, EOM-I and MMM Lungs: CTA bilaterally Cardiovascular: RRR, Nl S1/S2 and -M/R/G  Abdomen: Soft, NT, ND and +BS Extremities: -edema and -tenderness Neuro: Alert and interactive, moving all ext to command Skin: Intact  Resolved Hospital Problem list   N/A  Assessment & Plan:  58 year old female with no significant PMH who presents to PCCM with a DVT and PE.  Large PE noted on a CT that I reviewed myself.  Discussed with PCCM-NP and TRH-MD.  PE: patient is on room air and hypertensive with no symptoms while sitting still.  - Do not recommend lytics at this time  - Heparin  - Pharmacy to give patient pamphlets regarding coumadin vs NOAC for patient to decide regarding long term anti-coagulation  - F/U on echo  - Close tele monitoring  - May consider informing IR of the case incase decompensation as patient has a high clot burden but will defer to primary.  DVT:  - No mobile clot, no need for IVC filter at this time  - If patient becomes hypotensive or suddenly worsening SOB then may reconsider lytics  Hypoxemia:  - May need to consider an ambulatory desat study prior to discharge for home O2.  2D echo pending, even with RV strain, if patient remains as asymptomatic then no need for lytics.  PCCM will sign off, please call back if needed.  Labs   CBC: Recent Labs  Lab 02/08/19 2200  WBC 11.7*  HGB 13.3  HCT 41.1  MCV  94.7  PLT 742    Basic Metabolic Panel: Recent Labs  Lab 02/08/19 2200  NA 140  K 3.9  CL 101  CO2 24  GLUCOSE 129*  BUN 23*  CREATININE 0.99  CALCIUM 9.2   GFR: Estimated Creatinine Clearance: 65.2 mL/min (by C-G formula based on SCr of 0.99 mg/dL). Recent Labs  Lab 02/08/19 2200  WBC 11.7*    Liver Function Tests: No results for input(s): AST, ALT, ALKPHOS, BILITOT, PROT, ALBUMIN in the last 168 hours. No results for input(s): LIPASE, AMYLASE in the last 168 hours. No results for input(s): AMMONIA in the last 168 hours.  ABG No results found for: PHART, PCO2ART, PO2ART, HCO3, TCO2, ACIDBASEDEF, O2SAT   Coagulation  Profile: Recent Labs  Lab 02/09/19 0011  INR 1.8*    Cardiac Enzymes: No results for input(s): CKTOTAL, CKMB, CKMBINDEX, TROPONINI in the last 168 hours.  HbA1C: No results found for: HGBA1C  CBG: No results for input(s): GLUCAP in the last 168 hours.  Review of Systems:   12 point ROS negative other than above  Past Medical History  She,  has a past medical history of DVT (deep vein thrombosis) in pregnancy, Gallstones, and Seizures ([place]).   Surgical History    Past Surgical History:  Procedure Laterality Date  . CHOLECYSTECTOMY N/A 07/02/2013   Procedure: LAPAROSCOPIC CHOLECYSTECTOMY WITH INTRAOPERATIVE CHOLANGIOGRAM;  Surgeon: Adin Hector, MD;  Location: [facility] ORS;  Service: General;  Laterality: N/A;    Social History   reports that she has never smoked. She has never used smokeless tobacco. She reports that she does not drink alcohol or use drugs.   Family History   Her family history includes Heart attack in her father; Heart disease in her brother; Parkinson's disease in her father.   Allergies Allergies  Allergen Reactions  . Tegretol [Carbamazepine] Hives  . Keppra [Levetiracetam]     Feels outside her body.[name]  . Sulfa Antibiotics Hives     Home Medications  Prior to Admission medications   Medication Sig Start Date End Date Taking? Authorizing Provider  ALPRAZolam Duanne Moron) 0.5 MG tablet Take 1 tablet (0.5 mg total) by mouth at bedtime as needed for anxiety. As needed for seizure activity- anxiety ,  Do not drive for 6 hours after medication. 06/17/16  Yes Dohmeier, Asencion Partridge, MD  ibuprofen (ADVIL) 200 MG tablet Take 400 mg by mouth daily as needed for mild pain or moderate pain.   Yes [provider]  PHENobarbital (LUMINAL) 64.8 MG tablet TAKE 2 TABLETS BY MOUTH AT BEDTIME Patient taking differently: Take 139.6 mg by mouth at bedtime.  10/25/18  Yes Dohmeier, Asencion Partridge, MD  phentermine (ADIPEX-P) 37.5 MG tablet Take 37.5 mg by mouth daily as needed  (to curve appetite).  02/12/18  Yes [provider]  Rivaroxaban 15 & 20 MG TBPK Take 15 mg by mouth 2 (two) times a day. Take as directed on package: Start with one 68m tablet by mouth twice a day with food. On Day 22, switch to one 231mtablet once a day with food. Patient taking differently: Take 15-20 mg by mouth See admin instructions. Take as directed on package: Start with one 1535mablet by mouth twice a day with food. On Day 22, switch to one 12m71mblet once a day with food. 02/08/19  Yes ZackFredia Sorrow  rivaroxaban (XARAlveda ReasonsT Utilize this kit to help you learn about Xarelto treatment, and receive a coupon to pay for the first month. Patient not  taking: Reported on 02/08/2019 02/08/19   Daleen Bo, MD    Rush Farmer, M.D. [facility]. Pager: [phone]. After hours pager: [phone].

## 2019-02-09 NOTE — TOC Initial Note (Addendum)
Transition of Care [facility]) - Initial/Assessment Note    Patient Details  Name: Sarah Chung MRN: [id number] Date of Birth: [birth year]  [facility]) CM/SW Contact:    Zenon Mayo, RN Phone Number: 02/09/2019, 3:11 PM  Clinical Narrative:                 From home with spouse, pta indep, she does not have any insurance, she states she has xarelto, for which she used a 30 day free coupon, she has only take one day worth of pills.  This NCM will have Dr. Karleen Hampshire fill out prescriber part of the patient ast form and the patient will fill out her part and fax it in and keep a copy to take to MD follow up apt.  She will need to call and make a follow up apt at one of the clinics on her AVS next week, so she can make sure she has follow up with getting her xarelto thru patient ast.  The Doctors office are all closed due to the July 4th holiday.  This was explained to patient and to the RN caring for patient.  NCM gave patient brochures to all the clinics that she can try and call and make follow up apts with. NCM asked MD if a Match letter is needed for any other meds, she states no, patient is only going to be on xarelto.    Expected Discharge Plan: Home/Self Care Barriers to Discharge: No Barriers Identified   Patient Goals and CMS Choice Patient states their goals for this hospitalization and ongoing recovery are:: get better   Choice offered to / list presented to : NA  Expected Discharge Plan and Services Expected Discharge Plan: Home/Self Care   Discharge Planning Services: CM Consult Post Acute Care Choice: NA Living arrangements for the past 2 months: Single Family Home                 DME Arranged: (NA)         HH Arranged: NA          Prior Living Arrangements/Services Living arrangements for the past 2 months: Single Family Home Lives with:: Spouse Patient language and need for interpreter reviewed:: Yes Do you feel safe going back to the place where  you live?: Yes      Need for Family Participation in Patient Care: No (Comment) Care giver support system in place?: No (comment)   Criminal Activity/Legal Involvement Pertinent to Current Situation/Hospitalization: No - Comment as needed  Activities of Daily Living Home Assistive Devices/Equipment: None ADL Screening (condition at time of admission) Patient's cognitive ability adequate to safely complete daily activities?: Yes Is the patient deaf or have difficulty hearing?: No Does the patient have difficulty seeing, even when wearing glasses/contacts?: No Does the patient have difficulty concentrating, remembering, or making decisions?: No Patient able to express need for assistance with ADLs?: Yes Does the patient have difficulty dressing or bathing?: No Independently performs ADLs?: Yes (appropriate for developmental age) Does the patient have difficulty walking or climbing stairs?: No Weakness of Legs: None Weakness of Arms/Hands: None  Permission Sought/Granted                  Emotional Assessment Appearance:: Appears stated age Attitude/Demeanor/Rapport: Gracious Affect (typically observed): Appropriate Orientation: : Oriented to Self, Oriented to Place, Oriented to  Time, Oriented to Situation Alcohol / Substance Use: Not Applicable Psych Involvement: No (comment)  Admission diagnosis:  Elevated troponin [  R79.89] DVT (deep vein thrombosis) in pregnancy [O22.30] Dyspnea, unspecified type [R06.00] Patient Active Problem List   Diagnosis Date Noted  . Pulmonary embolism (HCC) 02/09/2019  . Partial symptomatic epilepsy with complex partial seizures, not intractable, without status epilepticus (HCC) 02/27/2018  . Numbness 02/14/2018  . 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 Menopause present, declines hormone replacement therapy 06/17/2016  . Seizures (HCC) 01/09/2014  . Medication monitoring encounter  01/09/2014  . Calculus of gallbladder with acute cholecystitis, without mention of obstruction 07/03/2013  . 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 Encounter for therapeutic drug monitoring 12/20/2012   PCP:  Samuella BruinMann, Benjamin L, PA-C Pharm[facility]   [facility] [phone] - [place], [place] - [address] [facility] [id number] [place] [id number] Phone: [phone] Fax: [phone]     Social Determinants of Health (SDOH) Interventions    Readmission Risk Interventions Readmission Risk Prevention Plan 02/09/2019  Post Dischage Appt Complete  Medication Screening Complete  Transportation Screening Complete  Some recent data might be hidden

## 2019-02-09 NOTE — Progress Notes (Signed)
Sophonie Goforth  is a 58 y.o. female,  w h/o seizure, h/o DVT (w pregnancy), apparently c/o right leg swelling earlier yesterday, WAS found to have saddle PE on the left and a new dvt on the right.  She was started on IV heparin and admitted for further eval.  She is currently asymptomatic.  Appreciate IR and PCCM recommendations.  Follow echocardiogram.     Hosie Poisson, MD [phone]

## 2019-02-09 NOTE — Progress Notes (Signed)
[facility] for heparin Indication: pulmonary embolus  Allergies  Allergen Reactions  . Tegretol [Carbamazepine] Hives  . Keppra [Levetiracetam]     Feels outside her body.[name]  . Sulfa Antibiotics Hives    Patient Measurements: Height: 5\' 4"  (162.6 cm) Weight: 190 lb 14.7 oz (86.6 kg) IBW/kg (Calculated) : 54.7 HEPARIN DW (KG): 73.8   Vital Signs: Temp: 98.7 F (37.1 C) (07/03 0820) Temp Source: Oral (07/03 0820) BP: 139/100 (07/03 0900) Pulse Rate: 96 (07/03 0900)  Labs: Recent Labs    02/08/19 2200 02/09/19 0011 02/09/19 0909  HGB 13.3  --   --   HCT 41.1  --   --   PLT 204  --   --   APTT  --  38* 63*  LABPROT  --  20.8*  --   INR  --  1.8*  --   HEPARINUNFRC  --  2.00*  --   CREATININE 0.99  --   --    Estimated Creatinine Clearance: 65.2 mL/min (by C-G formula based on SCr of 0.99 mg/dL).  Medical History: Past Medical History:  Diagnosis Date  . DVT (deep vein thrombosis) in pregnancy   . Gallstones   . Seizures ([place])    Scheduled:  . PHENobarbital  129.6 mg Oral QHS    Assessment: 58 yo female in ED with SOB and chest pain.  Was seen earlier on 7/2 for leg swelling and diagnosed with lower leg DVT.  Patient started Xarelto and took 15mg  dose at 2000 on 7/2. Returned with a PE.   Current APTT 63 secs - subtherapeutic    Goal of Therapy:  APTT goal 66-102 secs Heparin level 0.3-0.7 units/ml   Plan:  Increase heparin infusion to 1450 units/hr Check APTT level in 6 hours  Jolynda Townley A Aniyiah Zell, RPH 02/09/2019,10:41 AM

## 2019-02-09 NOTE — H&P (Signed)
TRH H&P    Patient Demographics:    Sarah Chung, is a 58 y.o. female  MRN: [id number]  DOB - [birth year]  Admit Date - 02/08/2019  Referring MD/NP/PA:  Mathis Fare  Outpatient Primary MD for the patient is Cory Munch, PA-C  Patient coming from:  home  Chief complaint-  Chest pain   HPI:    Sarah Chung  is a 58 y.o. female,  w h/o seizure, h/o DVT (w pregnancy), apparently c/o right leg swelling earlier yesterday. Came to ED,  Started on Xarelto.  Pt returns with c/o chest pain and sob.  Pt states that on further reflection that she has had right leg swelling x 2 days as well as sob x 2 days.   Pt denies any recent travel esp airplane flight.  No family hx of blood clot. Pt denies any wt loss.  Pt notes had mammogram and colonoscopy this year both negative.  No hx of recent trauma to the right lower ext.   Pt denies fever, chills, cough, palp, n/v, abd pain, diarrhea, brbpr, black stool.   In ED,  T 98.0 P 92 R 18, Bp 162/96  Pox 98%   CTA chest IMPRESSION: 1. Positive for Acute Pulmonary Embolus with bulky bilateral clot burden and (left eccentric) saddle embolus. There is CTA evidence of heart strain. 2. No pleural effusion or pulmonary infarct at this time. 3. Coronary artery and aortic Atherosclerosis (ICD10-I70.0).  Na 140, K 3.9, Bun 23, Creatinine 0.99 Glucose 129 Wbc 11.7, Hgb 13.3, Plt 204 Trop I 229 INR 1.8, PTT 38  Ekg ST at 108, nl axis, t inversion in v1,2, st depression slight in 2, 3, avf, and lateral leads  Pt will be admitted for acute RLE DVT and acute pulmonary embolism       Review of systems:    In addition to the HPI above,  No Fever-chills, No Headache, No changes with Vision or hearing, No problems swallowing food or Liquids, No Abdominal pain, No Nausea or Vomiting, bowel movements are regular, No Blood in stool or Urine, No dysuria, No new skin  rashes or bruises, No new joints pains-aches,  No new weakness, tingling, numbness in any extremity, No recent weight gain or loss, No polyuria, polydypsia or polyphagia, No significant Mental Stressors.  All other systems reviewed and are negative.    Past History of the following :    Past Medical History:  Diagnosis Date   DVT (deep vein thrombosis) in pregnancy    Gallstones    Seizures ([place])       Past Surgical History:  Procedure Laterality Date   CHOLECYSTECTOMY N/A 07/02/2013   Procedure: LAPAROSCOPIC CHOLECYSTECTOMY WITH INTRAOPERATIVE CHOLANGIOGRAM;  Surgeon: Adin Hector, MD;  Location: [facility];  Service: General;  Laterality: N/A;      Social History:      Social History   Tobacco Use   Smoking status: Never Smoker   Smokeless tobacco: Never Used  Substance Use Topics   Alcohol use: No  Family History :     Family History  Problem Relation Age of Onset   Heart attack Father    Parkinson's disease Father    Heart disease Brother       Home Medications:   Prior to Admission medications   Medication Sig Start Date End Date Taking? Authorizing Provider  ALPRAZolam Duanne Moron) 0.5 MG tablet Take 1 tablet (0.5 mg total) by mouth at bedtime as needed for anxiety. As needed for seizure activity- anxiety ,  Do not drive for 6 hours after medication. 06/17/16  Yes Dohmeier, Asencion Partridge, MD  ibuprofen (ADVIL) 200 MG tablet Take 400 mg by mouth daily as needed for mild pain or moderate pain.   Yes [provider]  PHENobarbital (LUMINAL) 64.8 MG tablet TAKE 2 TABLETS BY MOUTH AT BEDTIME Patient taking differently: Take 139.6 mg by mouth at bedtime.  10/25/18  Yes Dohmeier, Asencion Partridge, MD  phentermine (ADIPEX-P) 37.5 MG tablet Take 37.5 mg by mouth daily as needed (to curve appetite).  02/12/18  Yes [provider]  Rivaroxaban 15 & 20 MG TBPK Take 15 mg by mouth 2 (two) times a day. Take as directed on package: Start with one 34m tablet  by mouth twice a day with food. On Day 22, switch to one 291mtablet once a day with food. Patient taking differently: Take 15-20 mg by mouth See admin instructions. Take as directed on package: Start with one 1551mablet by mouth twice a day with food. On Day 22, switch to one 34m32mblet once a day with food. 02/08/19  Yes ZackFredia Sorrow  rivaroxaban (XARAlveda ReasonsT Utilize this kit to help you learn about Xarelto treatment, and receive a coupon to pay for the first month. Patient not taking: Reported on 02/08/2019 02/08/19   WentDaleen Bo     Allergies:     Allergies  Allergen Reactions   Tegretol [Carbamazepine] Hives   Keppra [Levetiracetam]     Feels outside her body..looMarland Kitcheny   Sulfa Antibiotics Hives     Physical Exam:   Vitals  Height 5' 4"  (1.626 m), weight 86.6 kg.  1.  General: axox3  2. Psychiatric: euthymic  3. Neurologic: cn2-12 intact, reflexes 2+ symmetric, diffuse with no clonus Motor 5/5 in all 4 ext  4. HEENMT:  Anicteric, pupil 1.5mm 73mmetric, direct, consensual near intact Neck: no jvd, no bruit  5. Respiratory : CTAB  6. Cardiovascular : tachy s1, s2, no m/g/r  7. Gastrointestinal:  Abd: soft, nt, nd, + bs  8. Skin:  Ext: no c/c    1+ edema right distal lower ext, + homans sign, tenderness to the back of the right calf. , no rash   9.Musculoskeletal:  Good ROM  No adenopathy    Data Review:    CBC Recent Labs  Lab 02/08/19 2200  WBC 11.7*  HGB 13.3  HCT 41.1  PLT 204  MCV 94.7  MCH 30.6  MCHC 32.4  RDW 12.8   ------------------------------------------------------------------------------------------------------------------  Results for orders placed or performed during the hospital encounter of 02/08/19 (from the past 48 hour(s))  Basic metabolic panel     Status: Abnormal   Collection Time: 02/08/19 10:00 PM  Result Value Ref Range   Sodium 140 135 - 145 mmol/L   Potassium 3.9 3.5 - 5.1 mmol/L   Chloride 101 98 -  111 mmol/L   CO2 24 22 - 32 mmol/L   Glucose, Bld 129 (H) 70 - 99 mg/dL   BUN 23 (H)  6 - 20 mg/dL   Creatinine, Ser 0.99 0.44 - 1.00 mg/dL   Calcium 9.2 8.9 - 10.3 mg/dL   GFR calc non Af Amer >60 >60 mL/min   GFR calc Af Amer >60 >60 mL/min   Anion gap 15 5 - 15    Comment: Performed at [facility], [address] [place], [place] [address]  CBC     Status: Abnormal   Collection Time: 02/08/19 10:00 PM  Result Value Ref Range   WBC 11.7 (H) 4.0 - 10.5 K/uL   RBC 4.34 3.87 - 5.11 MIL/uL   Hemoglobin 13.3 12.0 - 15.0 g/dL   HCT 41.1 36.0 - 46.0 %   MCV 94.7 80.0 - 100.0 fL   MCH 30.6 26.0 - 34.0 pg   MCHC 32.4 30.0 - 36.0 g/dL   RDW 12.8 11.5 - 15.5 %   Platelets 204 150 - 400 K/uL   nRBC 0.0 0.0 - 0.2 %    Comment: Performed at [facility], [address], [place], [place] [address]  Troponin I (High Sensitivity)     Status: Abnormal   Collection Time: 02/08/19 10:00 PM  Result Value Ref Range   Troponin I (High Sensitivity) 229.00 (HH) <18 ng/L    Comment: CRITICAL RESULT CALLED TO, READ BACK BY AND VERIFIED WITH: MYRICK,B ON 02/08/19 AT 2300 BY LOY,C (NOTE) Elevated high sensitivity troponin I (hsTnI) values and significant  changes across serial measurements may suggest ACS but many other  chronic and acute conditions are known to elevate hsTnI results.  Refer to the Links section for chest pain algorithms and additional  guidance. Performed at [facility], [address] [place], [place] [address]   Protime-INR     Status: Abnormal   Collection Time: 02/09/19 12:11 AM  Result Value Ref Range   Prothrombin Time 20.8 (H) 11.4 - 15.2 seconds   INR 1.8 (H) 0.8 - 1.2    Comment: (NOTE) INR goal varies based on device and disease states. Performed at [facility], [address], [place], [place] [address]   APTT     Status: Abnormal   Collection Time: 02/09/19 12:11 AM  Result Value Ref Range   aPTT 38 (H) 24 - 36 seconds    Comment:        IF BASELINE aPTT IS  ELEVATED, SUGGEST PATIENT RISK ASSESSMENT BE USED TO DETERMINE APPROPRIATE ANTICOAGULANT THERAPY. Performed at [facility], [address], [place], [place] [address]     Chemistries  Recent Labs  Lab 02/08/19 2200  NA 140  K 3.9  CL 101  CO2 24  GLUCOSE 129*  BUN 23*  CREATININE 0.99  CALCIUM 9.2   ------------------------------------------------------------------------------------------------------------------  ------------------------------------------------------------------------------------------------------------------ GFR: Estimated Creatinine Clearance: 65.2 mL/min (by C-G formula based on SCr of 0.99 mg/dL). Liver Function Tests: No results for input(s): AST, ALT, ALKPHOS, BILITOT, PROT, ALBUMIN in the last 168 hours. No results for input(s): LIPASE, AMYLASE in the last 168 hours. No results for input(s): AMMONIA in the last 168 hours. Coagulation Profile: Recent Labs  Lab 02/09/19 0011  INR 1.8*   Cardiac Enzymes: No results for input(s): CKTOTAL, CKMB, CKMBINDEX, TROPONINI in the last 168 hours. BNP (last 3 results) No results for input(s): PROBNP in the last 8760 hours. HbA1C: No results for input(s): HGBA1C in the last 72 hours. CBG: No results for input(s): GLUCAP in the last 168 hours. Lipid Profile: No results for input(s): CHOL, HDL, LDLCALC, TRIG, CHOLHDL, LDLDIRECT in the last 72 hours. Thyroid  Function Tests: No results for input(s): TSH, T4TOTAL, FREET4, T3FREE, THYROIDAB in the last 72 hours. Anemia Panel: No results for input(s): VITAMINB12, FOLATE, FERRITIN, TIBC, IRON, RETICCTPCT in the last 72 hours.  --------------------------------------------------------------------------------------------------------------- Urine analysis:    Component Value Date/Time   COLORURINE YELLOW 07/01/2013 1307   APPEARANCEUR CLEAR 07/01/2013 1307   LABSPEC 1.023 07/01/2013 1307   PHURINE 6.5 07/01/2013 1307   GLUCOSEU NEGATIVE 07/01/2013 1307   HGBUR  NEGATIVE 07/01/2013 1307   BILIRUBINUR NEGATIVE 07/01/2013 1307   KETONESUR NEGATIVE 07/01/2013 1307   PROTEINUR NEGATIVE 07/01/2013 1307   UROBILINOGEN 0.2 07/01/2013 1307   NITRITE NEGATIVE 07/01/2013 1307   LEUKOCYTESUR NEGATIVE 07/01/2013 1307      Imaging Results:    Ct Angio Chest Pe W/cm &/or Wo Cm  Result Date: 02/08/2019 CLINICAL DATA:  58 year old female with lower extremity DVT, chest pain and shortness of breath. EXAM: CT ANGIOGRAPHY CHEST WITH CONTRAST TECHNIQUE: Multidetector CT imaging of the chest was performed using the standard protocol during bolus administration of intravenous contrast. Multiplanar CT image reconstructions and MIPs were obtained to evaluate the vascular anatomy. CONTRAST:  14m OMNIPAQUE IOHEXOL 350 MG/ML SOLN COMPARISON:  Portable chest 10/06/2007. FINDINGS: Cardiovascular: Good contrast bolus timing in the pulmonary arterial tree. Positive for saddle embolus (although most of the central clot volume is on the left (series 10, image 76 and series 6, image 108). There is bilateral lower lobe, middle lobe, and upper lobe thrombus identified. The lower lobes appear most affected. Calcified coronary artery atherosclerosis. Cardiac size at the upper limits of normal. There is straightening of the intraventricular septum, and the RV/LV ratio is greater than 0.9. No pericardial effusion. Negative visible aorta aside from mild atherosclerosis. Mediastinum/Nodes: Negative, no lymphadenopathy. Lungs/Pleura: Major airways are patent. Somewhat low lung volumes with linear atelectasis along the right major fissure, mild dependent atelectasis. No pleural effusion. No confluent opacity suspicious for pulmonary infarct at this time. Upper Abdomen: Surgically absent gallbladder. Elevated right hemidiaphragm with negative visible liver. Negative visible spleen, pancreas, adrenal glands, kidneys and bowel. Musculoskeletal: No acute osseous abnormality identified. Review of the MIP  images confirms the above findings. IMPRESSION: 1. Positive for Acute Pulmonary Embolus with bulky bilateral clot burden and (left eccentric) saddle embolus. There is CTA evidence of heart strain. 2. No pleural effusion or pulmonary infarct at this time. 3. Coronary artery and aortic Atherosclerosis (ICD10-I70.0). Critical Value/emergent results were called by telephone at the time of interpretation on 02/08/2019 at 11:52 pm to Dr. Dr. CMalachy Moan who verbally acknowledged these results. Electronically Signed   By: HGenevie AnnM.D.   On: 02/08/2019 23:55   UK[place]Venous Img Lower Right (dvt Study)  Result Date: 02/08/2019 CLINICAL DATA:  58year old female with right calf pain for the past 2-3 days EXAM: RIGHT LOWER EXTREMITY VENOUS DOPPLER ULTRASOUND TECHNIQUE: Gray-scale sonography with graded compression, as well as color Doppler and duplex ultrasound were performed to evaluate the lower extremity deep venous systems from the level of the common femoral vein and including the common femoral, femoral, profunda femoral, popliteal and calf veins including the posterior tibial, peroneal and gastrocnemius veins when visible. The superficial great saphenous vein was also interrogated. Spectral Doppler was utilized to evaluate flow at rest and with distal augmentation maneuvers in the common femoral, femoral and popliteal veins. COMPARISON:  None. FINDINGS: Contralateral Common Femoral Vein: Respiratory phasicity is normal and symmetric with the symptomatic side. No evidence of thrombus. Normal compressibility. Common Femoral Vein: No evidence of thrombus. Normal  compressibility, respiratory phasicity and response to augmentation. Saphenofemoral Junction: No evidence of thrombus. Normal compressibility and flow on color Doppler imaging. Profunda Femoral Vein: No evidence of thrombus. Normal compressibility and flow on color Doppler imaging. Femoral Vein: No evidence of thrombus. Normal compressibility, respiratory phasicity  and response to augmentation. Popliteal Vein: Abnormal. The vessel is not compressible and there are low-level echoes throughout the vascular lumen. No convincing flow on color Doppler imaging. There is some transmitted pulsatility from the adjacent popliteal artery. Calf Veins: Occlusive thrombus extends into the posterior tibial and peroneal veins. Superficial Great Saphenous Vein: No evidence of thrombus. Normal compressibility. Venous Reflux:  None. Other Findings:  None. IMPRESSION: Positive for acute occlusive deep venous thrombosis in the popliteal and calf veins. Electronically Signed   By: Jacqulynn Cadet M.D.   On: 02/08/2019 10:04   Dg Knee Complete 4 Views Right  Result Date: 02/08/2019 CLINICAL DATA:  Right knee pain since Monday evening. EXAM: RIGHT KNEE - COMPLETE 4+ VIEW COMPARISON:  None. FINDINGS: The joint spaces are maintained. No acute bony findings or osteochondral lesion. No erosive changes or chondrocalcinosis. No joint effusion. IMPRESSION: Normal right knee radiographs. Electronically Signed   By: Marijo Sanes M.D.   On: 02/08/2019 10:05       Assessment & Plan:    Principal Problem:   Pulmonary embolism (HCC) Active Problems:   Seizures ([place])  Acute pulmonary embolism/ Acute right lower extremity DVT + troponin elevation secondary to demand ischemia Trop I q3h x2 Check hypercoagulable panel  Check cardiac echo Heparin GTT, no bolus since took xarelto earlier Due to large burden of clot (L saddle embolus, w right heart strain) pulmonary consult called in to [facility] ([place])  Seizure do Check phenobarbital level Cont Phenobarbital      DVT Prophylaxis-   Heparin gtt   AM Labs Ordered, also please review Full Orders  Family Communication: Admission, patients condition and plan of care including tests being ordered have been discussed with the patient  who indicate understanding and agree with the plan and Code Status.  Code Status:  FULL CODE, notified  daughter at patient request of her pending admission to [facility] for treatment of RLE DVT and acute PE  Admission status:  Inpatient: Based on patients clinical presentation and evaluation of above clinical data, I have made determination that patient meets Inpatient criteria at this time ,  Pt has high clinical risk of deterioration , pt failed oral xarelto ie had worsening symptoms but only after 1 dose.  Will require iv heparin due to large burden of clot in lung .  Pt will require > 2 nites stay.    Time spent in minutes : 70    Jani Gravel M.D on 02/09/2019 at 12:38 AM

## 2019-02-10 DIAGNOSIS — O2230 Deep phlebothrombosis in pregnancy, unspecified trimester: Secondary | ICD-10-CM

## 2019-02-10 LAB — HEPARIN LEVEL (UNFRACTIONATED): Heparin Unfractionated: 0.82 IU/mL — ABNORMAL HIGH (ref 0.30–0.70)

## 2019-02-10 LAB — APTT: aPTT: 91 s — ABNORMAL HIGH (ref 24–36)

## 2019-02-10 LAB — CBC
HCT: 36.8 % (ref 36.0–46.0)
Hemoglobin: 12 g/dL (ref 12.0–15.0)
MCH: 30.6 pg (ref 26.0–34.0)
MCHC: 32.6 g/dL (ref 30.0–36.0)
MCV: 93.9 fL (ref 80.0–100.0)
Platelets: 188 K/uL (ref 150–400)
RBC: 3.92 MIL/uL (ref 3.87–5.11)
RDW: 13 % (ref 11.5–15.5)
WBC: 9.7 K/uL (ref 4.0–10.5)
nRBC: 0 % (ref 0.0–0.2)

## 2019-02-10 LAB — HEMOGLOBIN A1C
Hgb A1c MFr Bld: 5.5 % (ref 4.8–5.6)
Mean Plasma Glucose: 111.15 mg/dL

## 2019-02-10 MED ORDER — RIVAROXABAN 15 MG PO TABS
15.0000 mg | ORAL_TABLET | Freq: Two times a day (BID) | ORAL | Status: DC
  Administered 2019-02-10: 15 mg via ORAL
  Filled 2019-02-10: qty 1

## 2019-02-10 NOTE — Progress Notes (Addendum)
Sarah Chung for Heparin to Xarelto Indication: pulmonary embolus  Allergies  Allergen Reactions  . Tegretol [Carbamazepine] Hives  . Keppra [Levetiracetam]     Feels outside her body.Sarah Chung  . Sulfa Antibiotics Hives    Patient Measurements: Height: 5\' 4"  (162.6 cm) Weight: 190 lb 14.7 oz (86.6 kg) IBW/kg (Calculated) : 54.7 HEPARIN DW (KG): 73.8  Vital Signs: Temp: 98.6 F (37 C) (07/03 2322) Temp Source: [place] (07/03 2322) BP: 131/89 (07/03 2322) Pulse Rate: 87 (07/03 2322)  Labs: Recent Labs    02/08/19 2200  02/09/19 0011 02/09/19 0909 02/09/19 1657 02/10/19 0126  HGB 13.3  --   --   --   --  12.0  HCT 41.1  --   --   --   --  36.8  PLT 204  --   --   --   --  188  APTT  --    < > 38* 63* 64* 91*  LABPROT  --   --  20.8*  --   --   --   INR  --   --  1.8*  --   --   --   HEPARINUNFRC  --   --  2.00*  --   --  0.82*  CREATININE 0.99  --   --   --   --   --    < > = values in this interval not displayed.   Estimated Creatinine Clearance: 65.2 mL/min (by C-G formula based on SCr of 0.99 mg/dL).   Assessment: 58 yo female in ED with SOB and chest pain.  Was seen earlier on 7/2 for leg swelling and diagnosed with lower leg DVT.  Patient started Xarelto and took 15mg  dose at 2000 on 7/2. Returned with a PE and Pharmacy consulted for IV heparin dosing.  Currently using aPTT to guide heparin dosing since Xarelto can falsely elevate heparin levels.  APTT therapeutic, CBC stable  PM transitioning to Xarelto  Goal of Therapy:  APTT goal 66-102 secs Heparin level 0.3-0.7 units/ml   Plan:  DC heparin -> Xarelto 15 mg po BID x 3 weeks then 20 mg daily   Thank you Anette Guarneri, PharmD [phone]

## 2019-02-10 NOTE — Discharge Instructions (Signed)
Information on my medicine - XARELTO® (rivaroxaban) ° °WHY WAS XARELTO® PRESCRIBED FOR YOU? °Xarelto® was prescribed to treat blood clots that may have been found in the veins of your legs (deep vein thrombosis) or in your lungs (pulmonary embolism) and to reduce the risk of them occurring again. ° °What do you need to know about Xarelto®? °The starting dose is one 15 mg tablet taken TWICE daily with food for the FIRST 21 DAYS then the dose is changed to one 20 mg tablet taken ONCE A DAY with your evening meal. ° °DO NOT stop taking Xarelto® without talking to the health care provider who prescribed the medication.  Refill your prescription for 20 mg tablets before you run out. ° °After discharge, you should have regular check-up appointments with your healthcare provider that is prescribing your Xarelto®.  In the future your dose may need to be changed if your kidney function changes by a significant amount. ° °What do you do if you miss a dose? °If you are taking Xarelto® TWICE DAILY and you miss a dose, take it as soon as you remember. You may take two 15 mg tablets (total 30 mg) at the same time then resume your regularly scheduled 15 mg twice daily the next day. ° °If you are taking Xarelto® ONCE DAILY and you miss a dose, take it as soon as you remember on the same day then continue your regularly scheduled once daily regimen the next day. Do not take two doses of Xarelto® at the same time.  ° °Important Safety Information °Xarelto® is a blood thinner medicine that can cause bleeding. You should call your healthcare provider right away if you experience any of the following: °? Bleeding from an injury or your nose that does not stop. °? Unusual colored urine (red or dark brown) or unusual colored stools (red or black). °? Unusual bruising for unknown reasons. °? A serious fall or if you hit your head (even if there is no bleeding). ° °Some medicines may interact with Xarelto® and might increase your risk of  bleeding while on Xarelto®. To help avoid this, consult your healthcare provider or pharmacist prior to using any new prescription or non-prescription medications, including herbals, vitamins, non-steroidal anti-inflammatory drugs (NSAIDs) and supplements. ° °This website has more information on Xarelto®: www.xarelto.com. ° °

## 2019-02-11 NOTE — Discharge Summary (Signed)
Physician Discharge Summary  Sarah Chung MHD:[id number] DOB: 1961/05/11 DOA: 02/08/2019  PCP: Sarah Munch, PA-C  Admit date: 02/08/2019 Discharge date: 02/10/2019  Admitted From: Home.  Disposition: Home.   Recommendations for Outpatient Follow-up:  1. Follow up with PCP in 1-2 weeks 2. Please obtain BMP/CBC in one week PLEASE follow up the hypercoagulable work up.   Discharge Condition:guarded.  CODE STATUS : full code.  Diet recommendation: Heart Healthy   Brief/Interim Summary: Sarah Chung a58 y.o.female,w h/o seizure, h/o DVT (w pregnancy), apparently c/o right leg swelling earlier yesterday, WAS found to have saddle PE on the left and a new dvt on the right.  She was started on IV heparin and admitted for further eval.  She is currently asymptomatic.   Discharge Diagnoses:  Principal Problem:   Pulmonary embolism (HCC) Active Problems:   Seizures ([place])  Bilateral pulmonary embolism and right popliteal DVT - Started on IV heparin and transitioned to oral xarelto on discharge.  Recommend follow up with hypercoagulable work up.  Appreciate PCCM recommendations.    Seizures: Resume home meds.    Discharge Instructions  Discharge Instructions    Diet - low sodium heart healthy   Complete by: As directed    Discharge instructions   Complete by: As directed    Please follow up the hypercoagulable work up with PCP in 2 to 4 weeks.     Allergies as of 02/10/2019      Reactions   Tegretol [carbamazepine] Hives   Keppra [levetiracetam]    Feels outside her body.[name]   Sulfa Antibiotics Hives      Medication List    TAKE these medications   ALPRAZolam 0.5 MG tablet Commonly known as: Xanax Take 1 tablet (0.5 mg total) by mouth at bedtime as needed for anxiety. As needed for seizure activity- anxiety ,  Do not drive for 6 hours after medication.   ibuprofen 200 MG tablet Commonly known as: ADVIL Take 400 mg by mouth daily as needed for mild pain or  moderate pain.   PHENobarbital 64.8 MG tablet Commonly known as: LUMINAL TAKE 2 TABLETS BY MOUTH AT BEDTIME What changed: how much to take   phentermine 37.5 MG tablet Commonly known as: ADIPEX-P Take 37.5 mg by mouth daily as needed (to curve appetite).   Rivaroxaban 15 & 20 MG Tbpk Take 15 mg by mouth 2 (two) times a day. Take as directed on package: Start with one 57m tablet by mouth twice a day with food. On Day 22, switch to one 287mtablet once a day with food. What changed:   how much to take  when to take this   rivaroxaban Kit Commonly known as: XAVarney Bileshis kit to help you learn about Xarelto treatment, and receive a coupon to pay for the first month.      [place]   Why: please call and make follow up apt Contact information: [phone]       [facility] Follow up.   Specialty: Internal Medicine Contact information: [phone]     [facility] Follow up.   Contact information: [id number]     [facility]   Contact information: [address]  [address] [phone]          Allergies  Allergen Reactions  . Tegretol [Carbamazepine] Hives  . Keppra [Levetiracetam]     Feels outside her body.[name]  . Sulfa Antibiotics Hives    Consultations:  PCCM  IR     Procedures/Studies: Ct Angio Chest Pe W/cm &/or Wo Cm  Result Date: 02/08/2019 CLINICAL DATA:  58 year old female with lower extremity DVT, chest pain and shortness of breath. EXAM: CT ANGIOGRAPHY CHEST WITH CONTRAST TECHNIQUE: Multidetector CT imaging of the chest was performed using the standard protocol during bolus administration of  intravenous contrast. Multiplanar CT image reconstructions and MIPs were obtained to evaluate the vascular anatomy. CONTRAST:  180m OMNIPAQUE IOHEXOL 350 MG/ML SOLN COMPARISON:  Portable chest 10/06/2007. FINDINGS: Cardiovascular: Good contrast bolus timing in the pulmonary arterial tree. Positive for saddle embolus (although most of the central clot volume is on the left (series 10, image 76 and series 6, image 108). There is bilateral lower lobe, middle lobe, and upper lobe thrombus identified. The lower lobes appear most affected. Calcified coronary artery atherosclerosis. Cardiac size at the upper limits of normal. There is straightening of the intraventricular septum, and the RV/LV ratio is greater than 0.9. No pericardial effusion. Negative visible aorta aside from mild atherosclerosis. Mediastinum/Nodes: Negative, no lymphadenopathy. Lungs/Pleura: Major airways are patent. Somewhat low lung volumes with linear atelectasis along the right major fissure, mild dependent atelectasis. No pleural effusion. No confluent opacity suspicious for pulmonary infarct at this time. Upper Abdomen: Surgically absent gallbladder. Elevated right hemidiaphragm with negative visible liver. Negative visible spleen, pancreas, adrenal glands, kidneys and bowel. Musculoskeletal: No acute osseous abnormality identified. Review of the MIP images confirms the above findings. IMPRESSION: 1. Positive for Acute Pulmonary Embolus with bulky bilateral clot burden and (left eccentric) saddle embolus. There is CTA evidence of heart strain. 2. No pleural effusion or pulmonary infarct at this time. 3. Coronary artery and aortic Atherosclerosis (ICD10-I70.0). Critical Value/emergent results were called by telephone at the time of interpretation on 02/08/2019 at 11:52 pm to Dr. Dr. CMalachy Moan who verbally acknowledged these results. Electronically Signed   By: HGenevie AnnM.D.   On: 02/08/2019 23:55   UKoreaVenous Img Lower Right (dvt  Study)  Result Date: 02/08/2019 CLINICAL DATA:  58year old female with right calf pain for the past 2-3 days EXAM: RIGHT LOWER EXTREMITY VENOUS DOPPLER ULTRASOUND TECHNIQUE: Gray-scale sonography with graded compression, as well as color Doppler and duplex ultrasound were performed to evaluate the lower extremity deep venous systems from the level of the common femoral vein and including the common femoral, femoral, profunda femoral, popliteal and calf veins including the posterior tibial, peroneal and gastrocnemius veins when visible. The superficial great saphenous vein was also interrogated. Spectral Doppler was utilized to evaluate flow at rest and with distal augmentation maneuvers in the common femoral, femoral and popliteal veins. COMPARISON:  None. FINDINGS: Contralateral Common Femoral Vein: Respiratory phasicity is normal and symmetric with the symptomatic side. No evidence of thrombus. Normal compressibility. Common Femoral Vein: No evidence of thrombus. Normal compressibility, respiratory phasicity and response to augmentation. Saphenofemoral Junction: No evidence of thrombus. Normal compressibility and flow on color Doppler imaging. Profunda Femoral Vein: No evidence of thrombus. Normal compressibility and flow on color Doppler imaging. Femoral Vein: No evidence of thrombus. Normal compressibility, respiratory phasicity and response to augmentation. Popliteal Vein: Abnormal. The vessel is not compressible and there are low-level echoes throughout the vascular lumen. No convincing flow on color Doppler imaging. There  is some transmitted pulsatility from the adjacent popliteal artery. Calf Veins: Occlusive thrombus extends into the posterior tibial and peroneal veins. Superficial Great Saphenous Vein: No evidence of thrombus. Normal compressibility. Venous Reflux:  None. Other Findings:  None. IMPRESSION: Positive for acute occlusive deep venous thrombosis in the popliteal and calf veins. Electronically  Signed   By: Jacqulynn Cadet M.D.   On: 02/08/2019 10:04   Dg Knee Complete 4 Views Right  Result Date: 02/08/2019 CLINICAL DATA:  Right knee pain since Monday evening. EXAM: RIGHT KNEE - COMPLETE 4+ VIEW COMPARISON:  None. FINDINGS: The joint spaces are maintained. No acute bony findings or osteochondral lesion. No erosive changes or chondrocalcinosis. No joint effusion. IMPRESSION: Normal right knee radiographs. Electronically Signed   By: Marijo Sanes M.D.   On: 02/08/2019 10:05       Subjective: No new complaints.   Discharge Exam: Vitals:   02/10/19 0900 02/10/19 1100  BP:    Pulse: 86 (P) 85  Resp:    Temp:  (P) 98.2 F (36.8 C)  SpO2: 94%    Vitals:   02/10/19 0347 02/10/19 0748 02/10/19 0900 02/10/19 1100  BP: 131/68 137/85    Pulse:   86 (P) 85  Resp:      Temp: 98.6 F (37 C) 98 F (36.7 C)  (P) 98.2 F (36.8 C)  TempSrc:  Oral  (P) Oral  SpO2:   94%   Weight: 90.9 kg     Height:        General: Pt is alert, awake, not in acute distress Cardiovascular: RRR, S1/S2 +, no rubs, no gallops Respiratory: CTA bilaterally, no wheezing, no rhonchi Abdominal: Soft, NT, ND, bowel sounds + Extremities: no edema, no cyanosis    The results of significant diagnostics from this hospitalization (including imaging, microbiology, ancillary and laboratory) are listed below for reference.     Microbiology: Recent Results (from the past 240 hour(s))  SARS Coronavirus 2 (CEPHEID - Performed in [place] hospital lab), Hosp Order     Status: None   Collection Time: 02/09/19 12:12 AM   Specimen: Nasopharyngeal Swab  Result Value Ref Range Status   SARS Coronavirus 2 NEGATIVE NEGATIVE Final    Comment: (NOTE) If result is NEGATIVE SARS-CoV-2 target nucleic acids are NOT DETECTED. The SARS-CoV-2 RNA is generally detectable in upper and lower  respiratory specimens during the acute phase of infection. The lowest  concentration of SARS-CoV-2 viral copies this assay can  detect is 250  copies / mL. A negative result does not preclude SARS-CoV-2 infection  and should not be used as the sole basis for treatment or other  patient management decisions.  A negative result may occur with  improper specimen collection / handling, submission of specimen other  than nasopharyngeal swab, presence of viral mutation(s) within the  areas targeted by this assay, and inadequate number of viral copies  (<250 copies / mL). A negative result must be combined with clinical  observations, patient history, and epidemiological information. If result is POSITIVE SARS-CoV-2 target nucleic acids are DETECTED. The SARS-CoV-2 RNA is generally detectable in upper and lower  respiratory specimens dur ing the acute phase of infection.  Positive  results are indicative of active infection with SARS-CoV-2.  Clinical  correlation with patient history and other diagnostic information is  necessary to determine patient infection status.  Positive results do  not rule out bacterial infection or co-infection with other viruses. If result is PRESUMPTIVE POSTIVE SARS-CoV-2 nucleic acids  MAY BE PRESENT.   A presumptive positive result was obtained on the submitted specimen  and confirmed on repeat testing.  While 2019 novel coronavirus  (SARS-CoV-2) nucleic acids may be present in the submitted sample  additional confirmatory testing may be necessary for epidemiological  and / or clinical management purposes  to differentiate between  SARS-CoV-2 and other Sarbecovirus currently known to infect humans.  If clinically indicated additional testing with an alternate test  methodology [phone]) is advised. The SARS-CoV-2 RNA is generally  detectable in upper and lower respiratory sp ecimens during the acute  phase of infection. The expected result is Negative. Fact Sheet for Patients:  StrictlyIdeas.no Fact Sheet for Healthcare  Providers: BankingDealers.co.za This test is not yet approved or cleared by the [place] FDA and has been authorized for detection and/or diagnosis of SARS-CoV-2 by FDA under an Emergency Use Authorization (EUA).  This EUA will remain in effect (meaning this test can be used) for the duration of the COVID-19 declaration under Section 564(b)(1) of the Act, 21 U.S.C. section 360bbb-3(b)(1), unless the authorization is terminated or revoked sooner. Performed at [facility], [address], [place], [place] [address]   MRSA PCR Screening     Status: None   Collection Time: 02/09/19  6:41 AM   Specimen: Nasopharyngeal  Result Value Ref Range Status   MRSA by PCR NEGATIVE NEGATIVE Final    Comment:        The GeneXpert MRSA Assay (FDA approved for NASAL specimens only), is one component of a comprehensive MRSA colonization surveillance program. It is not intended to diagnose MRSA infection nor to guide or monitor treatment for MRSA infections. Performed at [facility], [place] [address] [place], [place] [address]      Labs: BNP (last 3 results) No results for input(s): BNP in the last 8760 hours. Basic Metabolic Panel: Recent Labs  Lab 02/08/19 2200  NA 140  K 3.9  CL 101  CO2 24  GLUCOSE 129*  BUN 23*  CREATININE 0.99  CALCIUM 9.2   Liver Function Tests: No results for input(s): AST, ALT, ALKPHOS, BILITOT, PROT, ALBUMIN in the last 168 hours. No results for input(s): LIPASE, AMYLASE in the last 168 hours. No results for input(s): AMMONIA in the last 168 hours. CBC: Recent Labs  Lab 02/08/19 2200 02/10/19 0126  WBC 11.7* 9.7  HGB 13.3 12.0  HCT 41.1 36.8  MCV 94.7 93.9  PLT 204 188   Cardiac Enzymes: No results for input(s): CKTOTAL, CKMB, CKMBINDEX, TROPONINI in the last 168 hours. BNP: Invalid input(s): POCBNP CBG: No results for input(s): GLUCAP in the last 168 hours. D-Dimer No results for input(s): DDIMER in the last 72  hours. Hgb A1c Recent Labs    02/10/19 0126  HGBA1C 5.5   Lipid Profile No results for input(s): CHOL, HDL, LDLCALC, TRIG, CHOLHDL, LDLDIRECT in the last 72 hours. Thyroid function studies No results for input(s): TSH, T4TOTAL, T3FREE, THYROIDAB in the last 72 hours.  Invalid input(s): FREET3 Anemia work up No results for input(s): VITAMINB12, FOLATE, FERRITIN, TIBC, IRON, RETICCTPCT in the last 72 hours. Urinalysis    Component Value Date/Time   COLORURINE YELLOW 07/01/2013 1307   APPEARANCEUR CLEAR 07/01/2013 1307   LABSPEC 1.023 07/01/2013 1307   PHURINE 6.5 07/01/2013 1307   GLUCOSEU NEGATIVE 07/01/2013 1307   HGBUR NEGATIVE 07/01/2013 [place] 07/01/2013 1307   KETONESUR NEGATIVE 07/01/2013 1307   PROTEINUR NEGATIVE 07/01/2013 1307   UROBILINOGEN 0.2  07/01/2013 1307   NITRITE NEGATIVE 07/01/2013 1307   LEUKOCYTESUR NEGATIVE 07/01/2013 1307   Sepsis Labs Invalid input(s): PROCALCITONIN,  WBC,  LACTICIDVEN Microbiology Recent Results (from the past 240 hour(s))  SARS Coronavirus 2 (CEPHEID - Performed in [place] hospital lab), Hosp Order     Status: None   Collection Time: 02/09/19 12:12 AM   Specimen: Nasopharyngeal Swab  Result Value Ref Range Status   SARS Coronavirus 2 NEGATIVE NEGATIVE Final    Comment: (NOTE) If result is NEGATIVE SARS-CoV-2 target nucleic acids are NOT DETECTED. The SARS-CoV-2 RNA is generally detectable in upper and lower  respiratory specimens during the acute phase of infection. The lowest  concentration of SARS-CoV-2 viral copies this assay can detect is 250  copies / mL. A negative result does not preclude SARS-CoV-2 infection  and should not be used as the sole basis for treatment or other  patient management decisions.  A negative result may occur with  improper specimen collection / handling, submission of specimen other  than nasopharyngeal swab, presence of viral mutation(s) within the  areas targeted by  this assay, and inadequate number of viral copies  (<250 copies / mL). A negative result must be combined with clinical  observations, patient history, and epidemiological information. If result is POSITIVE SARS-CoV-2 target nucleic acids are DETECTED. The SARS-CoV-2 RNA is generally detectable in upper and lower  respiratory specimens dur ing the acute phase of infection.  Positive  results are indicative of active infection with SARS-CoV-2.  Clinical  correlation with patient history and other diagnostic information is  necessary to determine patient infection status.  Positive results do  not rule out bacterial infection or co-infection with other viruses. If result is PRESUMPTIVE POSTIVE SARS-CoV-2 nucleic acids MAY BE PRESENT.   A presumptive positive result was obtained on the submitted specimen  and confirmed on repeat testing.  While 2019 novel coronavirus  (SARS-CoV-2) nucleic acids may be present in the submitted sample  additional confirmatory testing may be necessary for epidemiological  and / or clinical management purposes  to differentiate between  SARS-CoV-2 and other Sarbecovirus currently known to infect humans.  If clinically indicated additional testing with an alternate test  methodology [phone]) is advised. The SARS-CoV-2 RNA is generally  detectable in upper and lower respiratory sp ecimens during the acute  phase of infection. The expected result is Negative. Fact Sheet for Patients:  StrictlyIdeas.no Fact Sheet for Healthcare Providers: BankingDealers.co.za This test is not yet approved or cleared by the [place] FDA and has been authorized for detection and/or diagnosis of SARS-CoV-2 by FDA under an Emergency Use Authorization (EUA).  This EUA will remain in effect (meaning this test can be used) for the duration of the COVID-19 declaration under Section 564(b)(1) of the Act, 21 U.S.C. section  360bbb-3(b)(1), unless the authorization is terminated or revoked sooner. Performed at [facility], [address], [place], [place] [address]   MRSA PCR Screening     Status: None   Collection Time: 02/09/19  6:41 AM   Specimen: Nasopharyngeal  Result Value Ref Range Status   MRSA by PCR NEGATIVE NEGATIVE Final    Comment:        The GeneXpert MRSA Assay (FDA approved for NASAL specimens only), is one component of a comprehensive MRSA colonization surveillance program. It is not intended to diagnose MRSA infection nor to guide or monitor treatment for MRSA infections. Performed at [facility], [place] [address] [place], [place] [address]  Time coordinating discharge: 32  minutes  SIGNED:   Hosie Poisson, MD  [facility] 02/11/2019, 3:33 PM Pager   If 7PM-7AM, please contact night-coverage [email or web address] Password [email or web address]

## 2019-02-23 ENCOUNTER — Encounter: Payer: Self-pay | Admitting: Neurology

## 2019-02-26 NOTE — Telephone Encounter (Signed)
Earliest avail appt for pt was October 21

## 2019-03-05 ENCOUNTER — Ambulatory Visit: Payer: Self-pay | Admitting: Neurology

## 2019-03-06 ENCOUNTER — Inpatient Hospital Stay (HOSPITAL_COMMUNITY): Payer: Self-pay | Attending: Hematology | Admitting: Hematology

## 2019-03-06 ENCOUNTER — Encounter (HOSPITAL_COMMUNITY): Payer: Self-pay | Admitting: Hematology

## 2019-03-06 ENCOUNTER — Other Ambulatory Visit: Payer: Self-pay

## 2019-03-06 VITALS — BP 146/90 | HR 85 | Temp 97.7°F | Resp 16 | Ht 64.0 in | Wt 194.0 lb

## 2019-03-06 DIAGNOSIS — Z791 Long term (current) use of non-steroidal anti-inflammatories (NSAID): Secondary | ICD-10-CM | POA: Insufficient documentation

## 2019-03-06 DIAGNOSIS — Z86711 Personal history of pulmonary embolism: Secondary | ICD-10-CM | POA: Insufficient documentation

## 2019-03-06 DIAGNOSIS — Z9049 Acquired absence of other specified parts of digestive tract: Secondary | ICD-10-CM | POA: Insufficient documentation

## 2019-03-06 DIAGNOSIS — Z7901 Long term (current) use of anticoagulants: Secondary | ICD-10-CM | POA: Insufficient documentation

## 2019-03-06 DIAGNOSIS — I2692 Saddle embolus of pulmonary artery without acute cor pulmonale: Secondary | ICD-10-CM

## 2019-03-06 DIAGNOSIS — I82431 Acute embolism and thrombosis of right popliteal vein: Secondary | ICD-10-CM | POA: Insufficient documentation

## 2019-03-06 DIAGNOSIS — Z86718 Personal history of other venous thrombosis and embolism: Secondary | ICD-10-CM | POA: Insufficient documentation

## 2019-03-06 DIAGNOSIS — I2699 Other pulmonary embolism without acute cor pulmonale: Secondary | ICD-10-CM | POA: Insufficient documentation

## 2019-03-06 DIAGNOSIS — G40209 Localization-related (focal) (partial) symptomatic epilepsy and epileptic syndromes with complex partial seizures, not intractable, without status epilepticus: Secondary | ICD-10-CM | POA: Insufficient documentation

## 2019-03-06 DIAGNOSIS — Z79899 Other long term (current) drug therapy: Secondary | ICD-10-CM | POA: Insufficient documentation

## 2019-03-06 NOTE — Assessment & Plan Note (Addendum)
1.  Unprovoked DVT and PE: -Presentation to the ER with right leg pain and shortness of breath on exertion, started 2 to 3 days ago. -Ultrasound of the lower extremities on 02/08/2019 was positive for acute occlusive DVT in the right popliteal and calf veins. - CT of the chest PE protocol on 02/08/2019 shows bulky bilateral clot burden and (left eccentric) saddle embolus.  CT evidence of heart strain. -She was admitted from 02/08/2019 through 02/10/2019 at [facility], started on anticoagulation, transitioned to Xarelto. -She had a history of left leg DVT 1993, few days after a C-section.  She was on limited duration Coumadin at that time. - She did not have any instigating factors prior to the latest DVT and PE. - 2D echo on 02/09/2019 shows ejection fraction of 60 to 65% with normal right ventricular cavity. 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  Based on her recent DVT and pulmonary embolism with clot burden, I have recommended indefinite anticoagulation. - She did not have any history of miscarriages.  I did not recommend any further hypercoagulable testing, as it would not help me decide the duration of anticoagulation. -She had a colonoscopy on 02/02/2018 which showed no endoscopic evidence of bleeding, diverticula, mass, polyps or tumor in the entire colon.  She also has yearly mammograms which were normal.  No B symptoms or any indication of occult malignancy.  She is a non-smoker.  No family history of DVT or malignancies. - We will reassess her in 1 year.  2.  Partial complex seizure disorder: -She is taking phenobarbital.  This is very well controlled.

## 2019-03-06 NOTE — Patient Instructions (Signed)
[place] at [facility] Discharge Instructions  You were seen today by Dr. Delton Coombes. He went over your history, family history, and how you've been feeling lately. He will see you back in 1 year for labs and follow up.   Thank you for choosing [place] at [facility] to provide your oncology and hematology care.  To afford each patient quality time with our provider, please arrive at least 15 minutes before your scheduled appointment time.   If you have a lab appointment with the [facility] please come in thru the  Main Entrance and check in at the main information desk  You need to re-schedule your appointment should you arrive 10 or more minutes late.  We strive to give you quality time with our providers, and arriving late affects you and other patients whose appointments are after yours.  Also, if you no show three or more times for appointments you may be dismissed from the clinic at the providers discretion.     Again, thank you for choosing [facility].  Our hope is that these requests will decrease the amount of time that you wait before being seen by our physicians.       _____________________________________________________________  Should you have questions after your visit to [facility], please contact our office at [phone] between the hours of 8:00 a.m. and 4:30 p.m.  Voicemails left after 4:00 p.m. will not be returned until the following business day.  For prescription refill requests, have your pharmacy contact our office and allow 72 hours.    Cancer Center Support Programs:   > Cancer Support Group  2nd Tuesday of the month 1pm-2pm, Journey Room

## 2019-03-06 NOTE — Progress Notes (Signed)
CONSULT NOTE  Patient Care Team: Ginger Organ as PCP - General (Physician Assistant)  CHIEF COMPLAINTS/PURPOSE OF CONSULTATION:  Unprovoked DVT and PE.  HISTORY OF PRESENTING ILLNESS:  Sarah Chung 58 y.o. female is seen in consultation today for further work-up and management of unprovoked right leg DVT and pulmonary embolism diagnosed on 02/08/2019.  She started noticing right leg pain and shortness of breath on exertion started 2 to 3 days prior to presentation to the ER.  She reportedly lifted heavy mattress and carried to neighboring house, and thought her right calf pain was secondary to leg.  She denied any injury.  No immobility.  She is very active individual.  After she was diagnosed with pulmonary embolism on 02/08/2019, with bulky bilateral clot burden and left eccentric saddle embolus, she was assessed for thrombolysis.  She was started on heparin which was transitioned to Xarelto.  Her shortness of breath on exertion has improved.  She is currently taking Xarelto 20 mg daily without any bleeding issues.  She denies any fevers, night sweats or weight loss in the last 6 months.  She reportedly has mammograms yearly which were normal.  Colonoscopy was also recently done and was normal.  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  She used to work as a Agricultural consultant.  She is a never smoker and nonalcoholic.  No family history of DVT or malignancies.  MEDICAL HISTORY:  Past Medical History:  Diagnosis Date  . DVT (deep vein thrombosis) in pregnancy   . Gallstones   . Seizures ([place])     SURGICAL HISTORY: Past Surgical History:  Procedure Laterality Date  . CHOLECYSTECTOMY N/A 07/02/2013   Procedure: LAPAROSCOPIC CHOLECYSTECTOMY WITH INTRAOPERATIVE CHOLANGIOGRAM;  Surgeon: Adin Hector, MD;  Location: [facility];  Service: General;  Laterality: N/A;    SOCIAL HISTORY: Social History   Socioeconomic History  . Marital status: Married     Spouse name: Octavia Bruckner  . Number of children: 2  . Years of education: College  . Highest education level: Not on file  Occupational History  . Occupation: Programmer, systems: [employer]  Social Needs  . Financial resource strain: Not on file  . Food insecurity    Worry: Not on file    Inability: Not on file  . Transportation needs    Medical: Not on file    Non-medical: Not on file  Tobacco Use  . Smoking status: Never Smoker  . Smokeless tobacco: Never Used  Substance and Sexual Activity  . Alcohol use: No  . Drug use: No  . Sexual activity: Yes    Birth control/protection: Post-menopausal  Lifestyle  . Physical activity    Days per week: Not on file    Minutes per session: Not on file  . Stress: Not on file  Relationships  . Social Herbalist on phone: Not on file    Gets together: Not on file    Attends religious service: Not on file    Active member of club or organization: Not on file    Attends meetings of clubs or organizations: Not on file    Relationship status: Not on file  . Intimate partner violence    Fear of current or ex partner: Not on file    Emotionally abused: Not on file    Physically abused: Not on file    Forced sexual activity: Not on file  Other Topics  Concern  . Not on file  Social History Narrative   Patient is married ([name]) and lives at home with his husband.   Patient has two adult children.   Patient is working full-time.   Patient has a college education.   Patient is right-handed.   Patient drinks two cups of coffee and some soda.    FAMILY HISTORY: Family History  Problem Relation Age of Onset  . Heart attack Father   . Parkinson's disease Father   . Heart disease Brother     ALLERGIES:  is allergic to tegretol [carbamazepine]; keppra [levetiracetam]; and sulfa antibiotics.  MEDICATIONS:  Current Outpatient Medications  Medication Sig Dispense Refill  . PHENobarbital (LUMINAL) 64.8 MG tablet TAKE 2  TABLETS BY MOUTH AT BEDTIME (Patient taking differently: Take 139.6 mg by mouth at bedtime. ) 180 tablet 1  . rivaroxaban (XARELTO) KIT Utilize this kit to help you learn about Xarelto treatment, and receive a coupon to pay for the first month. 1 kit 0  . Rivaroxaban 15 & 20 MG TBPK Take 15 mg by mouth 2 (two) times a day. Take as directed on package: Start with one 15mg tablet by mouth twice a day with food. On Day 22, switch to one 20mg tablet once a day with food. (Patient taking differently: Take 15-20 mg by mouth See admin instructions. Take as directed on package: Start with one 15mg tablet by mouth twice a day with food. On Day 22, switch to one 20mg tablet once a day with food.) 51 each 0  . ALPRAZolam (XANAX) 0.5 MG tablet Take 1 tablet (0.5 mg total) by mouth at bedtime as needed for anxiety. As needed for seizure activity- anxiety ,  Do not drive for 6 hours after medication. (Patient not taking: Reported on 03/06/2019) 30 tablet 0  . ibuprofen (ADVIL) 200 MG tablet Take 400 mg by mouth daily as needed for mild pain or moderate pain.    . phentermine (ADIPEX-P) 37.5 MG tablet Take 37.5 mg by mouth daily as needed (to curve appetite).   1   No current facility-administered medications for this visit.     REVIEW OF SYSTEMS:   Constitutional: Denies fevers, chills or abnormal night sweats Eyes: Denies blurriness of vision, double vision or watery eyes Ears, nose, mouth, throat, and face: Denies mucositis or sore throat Respiratory: Denies cough, dyspnea or wheezes Cardiovascular: Denies palpitation, chest discomfort or lower extremity swelling Gastrointestinal:  Denies nausea, heartburn or change in bowel habits Skin: Denies abnormal skin rashes Lymphatics: Denies new lymphadenopathy or easy bruising Neurological:Denies numbness, tingling or new weaknesses Behavioral/Psych: Mood is stable, no new changes  All other systems were reviewed with the patient and are negative.  PHYSICAL  EXAMINATION: ECOG PERFORMANCE STATUS: 0 - Asymptomatic  Vitals:   03/06/19 1300  BP: (!) 146/90  Pulse: 85  Resp: 16  Temp: 97.7 F (36.5 C)  SpO2: 100%   Filed Weights   03/06/19 1300  Weight: 194 lb (88 kg)    GENERAL:alert, no distress and comfortable SKIN: skin color, texture, turgor are normal, no rashes or significant lesions EYES: normal, conjunctiva are pink and non-injected, sclera clear OROPHARYNX:no exudate, no erythema and lips, buccal mucosa, and tongue normal  NECK: supple, thyroid normal size, non-tender, without nodularity LYMPH:  no palpable lymphadenopathy in the cervical, axillary or inguinal LUNGS: clear to auscultation and percussion with normal breathing effort HEART: regular rate & rhythm and no murmurs and no lower extremity edema ABDOMEN:abdomen soft, non-tender and   normal bowel sounds Musculoskeletal:no cyanosis of digits and no clubbing  PSYCH: alert & oriented x 3 with fluent speech NEURO: no focal motor/sensory deficits  LABORATORY DATA:  I have reviewed the data as listed Recent Results (from the past 2160 hour(s))  Basic metabolic panel     Status: Abnormal   Collection Time: 02/08/19 10:00 PM  Result Value Ref Range   Sodium 140 135 - 145 mmol/L   Potassium 3.9 3.5 - 5.1 mmol/L   Chloride 101 98 - 111 mmol/L   CO2 24 22 - 32 mmol/L   Glucose, Bld 129 (H) 70 - 99 mg/dL   BUN 23 (H) 6 - 20 mg/dL   Creatinine, Ser 0.99 0.44 - 1.00 mg/dL   Calcium 9.2 8.9 - 10.3 mg/dL   GFR calc non Af Amer >60 >60 mL/min   GFR calc Af Amer >60 >60 mL/min   Anion gap 15 5 - 15    Comment: Performed at [facility], [address] [place], [place] [address]  CBC     Status: Abnormal   Collection Time: 02/08/19 10:00 PM  Result Value Ref Range   WBC 11.7 (H) 4.0 - 10.5 K/uL   RBC 4.34 3.87 - 5.11 MIL/uL   Hemoglobin 13.3 12.0 - 15.0 g/dL   HCT 41.1 36.0 - 46.0 %   MCV 94.7 80.0 - 100.0 fL   MCH 30.6 26.0 - 34.0 pg   MCHC 32.4 30.0 - 36.0 g/dL   RDW  12.8 11.5 - 15.5 %   Platelets 204 150 - 400 K/uL   nRBC 0.0 0.0 - 0.2 %    Comment: Performed at [facility], [address] [place], [place] [address]  Troponin I (High Sensitivity)     Status: Abnormal   Collection Time: 02/08/19 10:00 PM  Result Value Ref Range   Troponin I (High Sensitivity) 229.00 (HH) <18 ng/L    Comment: CRITICAL RESULT CALLED TO, READ BACK BY AND VERIFIED WITH: MYRICK,B ON 02/08/19 AT 2300 BY LOY,C (NOTE) Elevated high sensitivity troponin I (hsTnI) values and significant  changes across serial measurements may suggest ACS but many other  chronic and acute conditions are known to elevate hsTnI results.  Refer to the Links section for chest pain algorithms and additional  guidance. Performed at [facility], [address] [place], [place] [address]   Troponin I (High Sensitivity)     Status: Abnormal   Collection Time: 02/09/19 12:08 AM  Result Value Ref Range   Troponin I (High Sensitivity) 263.00 (HH) <18 ng/L    Comment: CRITICAL VALUE NOTED.  VALUE IS CONSISTENT WITH PREVIOUSLY REPORTED AND CALLED VALUE. (NOTE) Elevated high sensitivity troponin I (hsTnI) values and significant  changes across serial measurements may suggest ACS but many other  chronic and acute conditions are known to elevate hsTnI results.  Refer to the Links section for chest pain algorithms and additional  guidance. Performed at [facility], [address] [place], [place] [address]   HIV antibody (Routine Testing)     Status: None   Collection Time: 02/09/19 12:08 AM  Result Value Ref Range   HIV Screen 4th Generation wRfx Non Reactive Non Reactive    Comment: (NOTE) Performed At: [facility] [address] [place], [place] [id number] Nagendra Sanjai MD Ph:[phone]   Protime-INR     Status: Abnormal   Collection Time: 02/09/19 12:11 AM  Result Value Ref Range   Prothrombin Time 20.8 (H) 11.4 - 15.2 seconds   INR   1.8 (H) 0.8 - 1.2    Comment: (NOTE) INR goal  varies based on device and disease states. Performed at [facility], [address] [place], [place] [address]   APTT     Status: Abnormal   Collection Time: 02/09/19 12:11 AM  Result Value Ref Range   aPTT 38 (H) 24 - 36 seconds    Comment:        IF BASELINE aPTT IS ELEVATED, SUGGEST PATIENT RISK ASSESSMENT BE USED TO DETERMINE APPROPRIATE ANTICOAGULANT THERAPY. Performed at [facility], [address], [place], [place] [address]   Heparin level     Status: Abnormal   Collection Time: 02/09/19 12:11 AM  Result Value Ref Range   Heparin Unfractionated 2.00 (H) 0.30 - 0.70 IU/mL    Comment: RESULTS CONFIRMED BY MANUAL DILUTION (NOTE) If heparin results are below expected values, and patient dosage has  been confirmed, suggest follow up testing of antithrombin III levels. Performed at [facility], [address], [place], [place] [address]   SARS Coronavirus 2 ([place] - Performed in [facility] hospital lab), Hosp Order     Status: None   Collection Time: 02/09/19 12:12 AM   Specimen: Nasopharyngeal Swab  Result Value Ref Range   SARS Coronavirus 2 NEGATIVE NEGATIVE    Comment: (NOTE) If result is NEGATIVE SARS-CoV-2 target nucleic acids are NOT DETECTED. The SARS-CoV-2 RNA is generally detectable in upper and lower  respiratory specimens during the acute phase of infection. The lowest  concentration of SARS-CoV-2 viral copies this assay can detect is 250  copies / mL. A negative result does not preclude SARS-CoV-2 infection  and should not be used as the sole basis for treatment or other  patient management decisions.  A negative result may occur with  improper specimen collection / handling, submission of specimen other  than nasopharyngeal swab, presence of viral mutation(s) within the  areas targeted by this assay, and inadequate number of viral copies  (<250 copies / mL). A negative result must be combined with clinical  observations, patient history, and  epidemiological information. If result is POSITIVE SARS-CoV-2 target nucleic acids are DETECTED. The SARS-CoV-2 RNA is generally detectable in upper and lower  respiratory specimens dur ing the acute phase of infection.  Positive  results are indicative of active infection with SARS-CoV-2.  Clinical  correlation with patient history and other diagnostic information is  necessary to determine patient infection status.  Positive results do  not rule out bacterial infection or co-infection with other viruses. If result is PRESUMPTIVE POSTIVE SARS-CoV-2 nucleic acids MAY BE PRESENT.   A presumptive positive result was obtained on the submitted specimen  and confirmed on repeat testing.  While 2019 novel coronavirus  (SARS-CoV-2) nucleic acids may be present in the submitted sample  additional confirmatory testing may be necessary for epidemiological  and / or clinical management purposes  to differentiate between  SARS-CoV-2 and other Sarbecovirus currently known to infect humans.  If clinically indicated additional testing with an alternate test  methodology [phone]) is advised. The SARS-CoV-2 RNA is generally  detectable in upper and lower respiratory sp ecimens during the acute  phase of infection. The expected result is Negative. Fact Sheet for Patients:  StrictlyIdeas.no Fact Sheet for Healthcare Providers: BankingDealers.co.za This test is not yet approved or cleared by the [place] FDA and has been authorized for detection and/or diagnosis of SARS-CoV-2 by FDA under an Emergency Use Authorization (EUA).  This EUA will remain in effect (meaning  this test can be used) for the duration of the COVID-19 declaration under Section 564(b)(1) of the Act, 21 U.S.C. section 360bbb-3(b)(1), unless the authorization is terminated or revoked sooner. Performed at [facility], [address] [place], [place] [address]   MRSA PCR Screening      Status: None   Collection Time: 02/09/19  6:41 AM   Specimen: Nasopharyngeal  Result Value Ref Range   MRSA by PCR NEGATIVE NEGATIVE    Comment:        The GeneXpert MRSA Assay (FDA approved for NASAL specimens only), is one component of a comprehensive MRSA colonization surveillance program. It is not intended to diagnose MRSA infection nor to guide or monitor treatment for MRSA infections. Performed at [facility], [address] [place], [place] [address]   APTT     Status: Abnormal   Collection Time: 02/09/19  9:09 AM  Result Value Ref Range   aPTT 63 (H) 24 - 36 seconds    Comment:        IF BASELINE aPTT IS ELEVATED, SUGGEST PATIENT RISK ASSESSMENT BE USED TO DETERMINE APPROPRIATE ANTICOAGULANT THERAPY. Performed at [facility], [address] [place], [place] [address]   ECHOCARDIOGRAM COMPLETE     Status: None   Collection Time: 02/09/19 10:50 AM  Result Value Ref Range   Weight 3,054.69 oz   Height 64 in   BP 139/100 mmHg  APTT     Status: Abnormal   Collection Time: 02/09/19  4:57 PM  Result Value Ref Range   aPTT 64 (H) 24 - 36 seconds    Comment:        IF BASELINE aPTT IS ELEVATED, SUGGEST PATIENT RISK ASSESSMENT BE USED TO DETERMINE APPROPRIATE ANTICOAGULANT THERAPY. Performed at [facility], [address] [place], [place] [address]   Hemoglobin A1c     Status: None   Collection Time: 02/10/19  1:26 AM  Result Value Ref Range   Hgb A1c MFr Bld 5.5 4.8 - 5.6 %    Comment: (NOTE) Pre diabetes:          5.7%-6.4% Diabetes:              >6.4% Glycemic control for   <7.0% adults with diabetes    Mean Plasma Glucose 111.15 mg/dL    Comment: Performed at [facility], [address] [place], [place] [address]  Heparin level (unfractionated)     Status: Abnormal   Collection Time: 02/10/19  1:26 AM  Result Value Ref Range   Heparin Unfractionated 0.82 (H) 0.30 - 0.70 IU/mL    Comment: (NOTE) If heparin results are  below expected values, and patient dosage has  been confirmed, suggest follow up testing of antithrombin III levels. Performed at [facility], [address] [place],  [address]   CBC     Status: None   Collection Time: 02/10/19  1:26 AM  Result Value Ref Range   WBC 9.7 4.0 - 10.5 K/uL   RBC 3.92 3.87 - 5.11 MIL/uL   Hemoglobin 12.0 12.0 - 15.0 g/dL   HCT 36.8 36.0 - 46.0 %   MCV 93.9 80.0 - 100.0 fL   MCH 30.6 26.0 - 34.0 pg   MCHC 32.6 30.0 - 36.0 g/dL   RDW 13.0 11.5 - 15.5 %   Platelets 188 150 - 400 K/uL   nRBC 0.0 0.0 - 0.2 %    Comment: Performed at [facility], [place] [address], [place], [place] [address]  APTT     Status: Abnormal   Collection Time: 02/10/19  1:26 AM  Result Value Ref Range   aPTT 91 (H) 24 - 36 seconds    Comment:        IF BASELINE aPTT IS ELEVATED, SUGGEST PATIENT RISK ASSESSMENT BE USED TO DETERMINE APPROPRIATE ANTICOAGULANT THERAPY. Performed at [facility], [place] [address], [place], [place] [address]     RADIOGRAPHIC STUDIES: I have personally reviewed the radiological images as listed and agreed with the findings in the report. Ct Angio Chest Pe W/cm &/or Wo Cm  Result Date: 02/08/2019 CLINICAL DATA:  58 year old female with lower extremity DVT, chest pain and shortness of breath. EXAM: CT ANGIOGRAPHY CHEST WITH CONTRAST TECHNIQUE: Multidetector CT imaging of the chest was performed using the standard protocol during bolus administration of intravenous contrast. Multiplanar CT image reconstructions and MIPs were obtained to evaluate the vascular anatomy. CONTRAST:  141m OMNIPAQUE IOHEXOL 350 MG/ML SOLN COMPARISON:  Portable chest 10/06/2007. FINDINGS: Cardiovascular: Good contrast bolus timing in the pulmonary arterial tree. Positive for saddle embolus (although most of the central clot volume is on the left (series 10, image 76 and series 6, image 108). There is bilateral lower lobe, middle lobe, and upper lobe thrombus  identified. The lower lobes appear most affected. Calcified coronary artery atherosclerosis. Cardiac size at the upper limits of normal. There is straightening of the intraventricular septum, and the RV/LV ratio is greater than 0.9. No pericardial effusion. Negative visible aorta aside from mild atherosclerosis. Mediastinum/Nodes: Negative, no lymphadenopathy. Lungs/Pleura: Major airways are patent. Somewhat low lung volumes with linear atelectasis along the right major fissure, mild dependent atelectasis. No pleural effusion. No confluent opacity suspicious for pulmonary infarct at this time. Upper Abdomen: Surgically absent gallbladder. Elevated right hemidiaphragm with negative visible liver. Negative visible spleen, pancreas, adrenal glands, kidneys and bowel. Musculoskeletal: No acute osseous abnormality identified. Review of the MIP images confirms the above findings. IMPRESSION: 1. Positive for Acute Pulmonary Embolus with bulky bilateral clot burden and (left eccentric) saddle embolus. There is CTA evidence of heart strain. 2. No pleural effusion or pulmonary infarct at this time. 3. Coronary artery and aortic Atherosclerosis (ICD10-I70.0). Critical Value/emergent results were called by telephone at the time of interpretation on 02/08/2019 at 11:52 pm to Dr. Dr. CMalachy Moan who verbally acknowledged these results. Electronically Signed   By: HGenevie AnnM.D.   On: 02/08/2019 23:55   UK[place]Venous Img Lower Right (dvt Study)  Result Date: 02/08/2019 CLINICAL DATA:  58year old female with right calf pain for the past 2-3 days EXAM: RIGHT LOWER EXTREMITY VENOUS DOPPLER ULTRASOUND TECHNIQUE: Gray-scale sonography with graded compression, as well as color Doppler and duplex ultrasound were performed to evaluate the lower extremity deep venous systems from the level of the common femoral vein and including the common femoral, femoral, profunda femoral, popliteal and calf veins including the posterior tibial, peroneal  and gastrocnemius veins when visible. The superficial great saphenous vein was also interrogated. Spectral Doppler was utilized to evaluate flow at rest and with distal augmentation maneuvers in the common femoral, femoral and popliteal veins. COMPARISON:  None. FINDINGS: Contralateral Common Femoral Vein: Respiratory phasicity is normal and symmetric with the symptomatic side. No evidence of thrombus. Normal compressibility. Common Femoral Vein: No evidence of thrombus. Normal compressibility, respiratory phasicity and response to augmentation. Saphenofemoral Junction: No evidence of thrombus. Normal compressibility and flow on color Doppler imaging. Profunda Femoral  Vein: No evidence of thrombus. Normal compressibility and flow on color Doppler imaging. Femoral Vein: No evidence of thrombus. Normal compressibility, respiratory phasicity and response to augmentation. Popliteal Vein: Abnormal. The vessel is not compressible and there are low-level echoes throughout the vascular lumen. No convincing flow on color Doppler imaging. There is some transmitted pulsatility from the adjacent popliteal artery. Calf Veins: Occlusive thrombus extends into the posterior tibial and peroneal veins. Superficial Great Saphenous Vein: No evidence of thrombus. Normal compressibility. Venous Reflux:  None. Other Findings:  None. IMPRESSION: Positive for acute occlusive deep venous thrombosis in the popliteal and calf veins. Electronically Signed   By: Jacqulynn Cadet M.D.   On: 02/08/2019 10:04   Dg Knee Complete 4 Views Right  Result Date: 02/08/2019 CLINICAL DATA:  Right knee pain since Monday evening. EXAM: RIGHT KNEE - COMPLETE 4+ VIEW COMPARISON:  None. FINDINGS: The joint spaces are maintained. No acute bony findings or osteochondral lesion. No erosive changes or chondrocalcinosis. No joint effusion. IMPRESSION: Normal right knee radiographs. Electronically Signed   By: Marijo Sanes M.D.   On: 02/08/2019 10:05     ASSESSMENT & PLAN:  Pulmonary embolism ([place]) 1.  Unprovoked DVT and PE: -Presentation to the ER with right leg pain and shortness of breath on exertion, started 2 to 3 days ago. -Ultrasound of the lower extremities on 02/08/2019 was positive for acute occlusive DVT in the right popliteal and calf veins. - CT of the chest PE protocol on 02/08/2019 shows bulky bilateral clot burden and (left eccentric) saddle embolus.  CT evidence of heart strain. -She was admitted from 02/08/2019 through 02/10/2019 at [facility], started on anticoagulation, transitioned to Xarelto. -She had a history of left leg DVT 1993, few days after a C-section.  She was on limited duration Coumadin at that time. - She did not have any instigating factors prior to the latest DVT and PE. - 2D echo on 02/09/2019 shows ejection fraction of 60 to 65% with normal right ventricular cavity. 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  Based on her recent DVT and pulmonary embolism with clot burden, I have recommended indefinite anticoagulation. - She did not have any history of miscarriages.  I did not recommend any further hypercoagulable testing, as it would not help me decide the duration of anticoagulation. -She had a colonoscopy on 02/02/2018 which showed no endoscopic evidence of bleeding, diverticula, mass, polyps or tumor in the entire colon.  She also has yearly mammograms which were normal.  No B symptoms or any indication of occult malignancy.  She is a non-smoker.  No family history of DVT or malignancies. - We will reassess her in 1 year.  2.  Partial complex seizure disorder: -She is taking phenobarbital.  This is very well controlled.     All questions were answered. The patient knows to call the clinic with any problems, questions or concerns.     Derek Jack, MD 03/06/19 6:23 PM

## 2019-03-07 ENCOUNTER — Encounter (HOSPITAL_COMMUNITY): Payer: Self-pay

## 2019-03-21 ENCOUNTER — Encounter (HOSPITAL_COMMUNITY): Payer: Self-pay

## 2019-03-22 ENCOUNTER — Other Ambulatory Visit (HOSPITAL_COMMUNITY): Payer: Self-pay | Admitting: Nurse Practitioner

## 2019-03-26 ENCOUNTER — Emergency Department (HOSPITAL_COMMUNITY)
Admission: EM | Admit: 2019-03-26 | Discharge: 2019-03-27 | Disposition: A | Discharge status: Home / Self Care | Payer: Self-pay | Attending: Emergency Medicine | Admitting: Emergency Medicine

## 2019-03-26 ENCOUNTER — Encounter (HOSPITAL_COMMUNITY): Payer: Self-pay | Admitting: Emergency Medicine

## 2019-03-26 DIAGNOSIS — Z86718 Personal history of other venous thrombosis and embolism: Secondary | ICD-10-CM | POA: Insufficient documentation

## 2019-03-26 DIAGNOSIS — R002 Palpitations: Secondary | ICD-10-CM | POA: Insufficient documentation

## 2019-03-26 DIAGNOSIS — Z7901 Long term (current) use of anticoagulants: Secondary | ICD-10-CM | POA: Insufficient documentation

## 2019-03-26 DIAGNOSIS — Z86711 Personal history of pulmonary embolism: Secondary | ICD-10-CM | POA: Insufficient documentation

## 2019-03-26 DIAGNOSIS — R0600 Dyspnea, unspecified: Secondary | ICD-10-CM | POA: Insufficient documentation

## 2019-03-26 HISTORY — DX: Other pulmonary embolism without acute cor pulmonale: I26.99

## 2019-03-26 LAB — I-STAT BETA HCG BLOOD, ED (MC, WL, AP ONLY): I-stat hCG, quantitative: 5.5 m[IU]/mL — ABNORMAL HIGH (ref <5)

## 2019-03-26 MED ORDER — SODIUM CHLORIDE 0.9% FLUSH: 3.0000 mL | Freq: Once | INTRAVENOUS | Status: DC

## 2019-03-26 NOTE — ED Triage Notes (Addendum)
Pt present with reports of irregular HR up to 148 today walking steps, pt had saddle PE in July 2020 with DVT.  Pt states she does not feel right. Pt compliant with meds d/t see cards tomorrow but HR went up tonight.

## 2019-03-26 NOTE — ED Notes (Signed)
No further orders at this time.

## 2019-03-27 ENCOUNTER — Emergency Department (HOSPITAL_COMMUNITY): Payer: Self-pay

## 2019-03-27 ENCOUNTER — Other Ambulatory Visit: Payer: Self-pay

## 2019-03-27 ENCOUNTER — Ambulatory Visit (INDEPENDENT_AMBULATORY_CARE_PROVIDER_SITE_OTHER): Payer: Self-pay | Admitting: Cardiovascular Disease

## 2019-03-27 ENCOUNTER — Encounter (HOSPITAL_COMMUNITY): Payer: Self-pay | Admitting: Radiology

## 2019-03-27 ENCOUNTER — Encounter: Payer: Self-pay | Admitting: Cardiovascular Disease

## 2019-03-27 VITALS — BP 123/86 | HR 80 | Ht 64.0 in | Wt 196.8 lb

## 2019-03-27 DIAGNOSIS — R000 Tachycardia, unspecified: Secondary | ICD-10-CM

## 2019-03-27 DIAGNOSIS — I209 Angina pectoris, unspecified: Secondary | ICD-10-CM

## 2019-03-27 DIAGNOSIS — R0609 Other forms of dyspnea: Secondary | ICD-10-CM

## 2019-03-27 DIAGNOSIS — I2692 Saddle embolus of pulmonary artery without acute cor pulmonale: Secondary | ICD-10-CM

## 2019-03-27 LAB — CBC
HCT: 41.5 % (ref 36.0–46.0)
Hemoglobin: 13.3 g/dL (ref 12.0–15.0)
MCH: 30.6 pg (ref 26.0–34.0)
MCHC: 32 g/dL (ref 30.0–36.0)
MCV: 95.4 fL (ref 80.0–100.0)
Platelets: 299 10*3/uL (ref 150–400)
RBC: 4.35 MIL/uL (ref 3.87–5.11)
RDW: 12.7 % (ref 11.5–15.5)
WBC: 8.8 10*3/uL (ref 4.0–10.5)
nRBC: 0 % (ref 0.0–0.2)

## 2019-03-27 LAB — BASIC METABOLIC PANEL
Anion gap: 10 (ref 5–15)
BUN: 22 mg/dL — ABNORMAL HIGH (ref 6–20)
CO2: 26 mmol/L (ref 22–32)
Calcium: 9.2 mg/dL (ref 8.9–10.3)
Chloride: 100 mmol/L (ref 98–111)
Creatinine, Ser: 1.07 mg/dL — ABNORMAL HIGH (ref 0.44–1.00)
GFR calc Af Amer: >60 mL/min (ref >60)
GFR calc non Af Amer: 57 mL/min — ABNORMAL LOW (ref >60)
Glucose, Bld: 98 mg/dL (ref 70–99)
Potassium: 3.6 mmol/L (ref 3.5–5.1)
Sodium: 136 mmol/L (ref 135–145)

## 2019-03-27 LAB — BRAIN NATRIURETIC PEPTIDE: B Natriuretic Peptide: 14.8 pg/mL (ref 0.0–100.0)

## 2019-03-27 LAB — TROPONIN I (HIGH SENSITIVITY): Troponin I (High Sensitivity): 3 ng/L (ref <18)

## 2019-03-27 MED ORDER — METOPROLOL TARTRATE 100 MG PO TABS: ORAL_TABLET | ORAL | 0 refills | Status: DC

## 2019-03-27 MED ORDER — IOHEXOL 350 MG/ML SOLN
80.0000 mL | Freq: Once | INTRAVENOUS | Status: AC | PRN
  Administered 2019-03-27: 100 mL via INTRAVENOUS

## 2019-03-27 NOTE — ED Notes (Signed)
Patient verbalizes understanding of discharge instructions. Opportunity for questioning and answers were provided. Armband removed by staff, pt discharged from ED ambulatory.   

## 2019-03-27 NOTE — ED Provider Notes (Signed)
Emergency Department Provider Note   I have reviewed the triage vital signs and the nursing notes.   HISTORY  Chief Complaint Irregular Heart Beat   HPI Sarah Chung is a 58 y.o. female recently diagnosed with a DVT and subsequently pulmonary embolus started on Xarelto after a heparin bridge the presents the emergency department with recurrent chest pain, dyspnea on exertion, fatigue and tachycardia with minimal exertion.  She is concerned that this may represent another PE.  She states compliance with her medications.  She states that they did a ultrasound while she was in the hospital (the ultrasound did not show any evidence at that time of an effusion or of a obvious right heart strain.  Patient without any lower extremity swelling.  No syncope.  No fever or cough.  No other associated symptoms.   No other associated or modifying symptoms.    Past Medical History:  Diagnosis Date  . DVT (deep vein thrombosis) in pregnancy   . Gallstones   . Pulmonary embolism ([place])   . Seizures [facility])     Patient Active Problem List   Diagnosis Date Noted  . Pulmonary embolism ([place]) 02/09/2019  . Partial symptomatic epilepsy with complex partial seizures, not intractable, without status epilepticus ([place]) 02/27/2018  . Numbness 02/14/2018  . 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 Menopause present, declines hormone replacement therapy 06/17/2016  . Seizures ([place]) 01/09/2014  . Medication monitoring encounter 01/09/2014  . Calculus of gallbladder with acute cholecystitis, without mention of obstruction 07/03/2013  . 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 Encounter for therapeutic drug monitoring 12/20/2012    Past Surgical History:  Procedure Laterality Date  . CHOLECYSTECTOMY N/A 07/02/2013   Procedure: LAPAROSCOPIC CHOLECYSTECTOMY WITH INTRAOPERATIVE CHOLANGIOGRAM;  Surgeon: Adin Hector, MD;  Location: [facility]  ORS;  Service: General;  Laterality: N/A;    Current Outpatient Rx  . Order #: [id number] Class: Historical Med  . Order #: [id number] Class: Normal  . Order #: [id number] Class: Historical Med  . Order #: [id number] Class: Print  . Order #: [id number] Class: Normal    Allergies Tegretol [carbamazepine], Keppra [levetiracetam], and Sulfa antibiotics  Family History  Problem Relation Age of Onset  . Heart attack Father   . Parkinson's disease Father   . Heart disease Brother     Social History Social History   Tobacco Use  . Smoking status: Never Smoker  . Smokeless tobacco: Never Used  Substance Use Topics  . Alcohol use: No  . Drug use: No    Review of Systems  All other systems negative except as documented in the HPI. All pertinent positives and negatives as reviewed in the HPI. ____________________________________________   PHYSICAL EXAM:  VITAL SIGNS: ED Triage Vitals  Enc Vitals Group     BP 03/26/19 2337 (!) 133/102     Pulse Rate 03/26/19 2337 83     Resp 03/26/19 2337 18     Temp 03/26/19 2337 98.3 F (36.8 C)     Temp Source 03/26/19 2337 Oral     SpO2 03/26/19 2337 100 %     Weight 03/26/19 2334 192 lb 3.9 oz (87.2 kg)     Height 03/26/19 2334 5\' 4"  (1.626 m)    Constitutional: Alert and oriented. Well appearing and in no acute distress. Eyes: Conjunctivae are normal. PERRL. EOMI. Head: Atraumatic. Nose: No congestion/rhinnorhea. Mouth/Throat: Mucous membranes are moist.  Oropharynx non-erythematous. Neck: No stridor.  No meningeal signs.   Cardiovascular:  Normal rate, regular rhythm. Good peripheral circulation. Grossly normal heart sounds.   Respiratory: Normal respiratory effort.  No retractions. Lungs CTAB. Gastrointestinal: Soft and nontender. No distention.  Musculoskeletal: No lower extremity tenderness nor edema. No gross deformities of extremities. Neurologic:  Normal speech and language. No gross focal neurologic deficits are appreciated.   Skin:  Skin is warm, dry and intact. No rash noted.   ____________________________________________   LABS (all labs ordered are listed, but only abnormal results are displayed)  Labs Reviewed  BASIC METABOLIC PANEL - Abnormal; Notable for the following components:      Result Value   BUN 22 (*)    Creatinine, Ser 1.07 (*)    GFR calc non Af Amer 57 (*)    All other components within normal limits  I-STAT BETA HCG BLOOD, ED ([facility], [facility], [facility] ONLY) - Abnormal; Notable for the following components:   I-stat hCG, quantitative 5.5 (*)    All other components within normal limits  CBC  BRAIN NATRIURETIC PEPTIDE  TROPONIN I (HIGH SENSITIVITY)  TROPONIN I (HIGH SENSITIVITY)   ____________________________________________  EKG   EKG Interpretation  Date/Time:  Tuesday March 27 2019 02:11:26 EDT Ventricular Rate:  68 PR Interval:    QRS Duration: 93 QT Interval:  417 QTC Calculation: 444 R Axis:   35 Text Interpretation:  Sinus rhythm slower rate, otherwise no significant change since july  Confirmed by Marily MemosMesner, Tosh Glaze [phone]([id number]) on 03/27/2019 2:12:47 AM       ____________________________________________  RADIOLOGY  Dg Chest 2 View  Result Date: 03/27/2019 CLINICAL DATA:  Chest pain and shortness of breath. Recent diagnosis of pulmonary embolus. EXAM: CHEST - 2 VIEW COMPARISON:  CT 02/08/2019 FINDINGS: The cardiomediastinal contours are normal. Minor lingular scarring/atelectasis. Pulmonary vasculature is normal. No consolidation, pleural effusion, or pneumothorax. No acute osseous abnormalities are seen. IMPRESSION: No acute chest findings. Electronically Signed   By: Narda RutherfordMelanie  Sanford M.D.   On: 03/27/2019 00:32   Ct Angio Chest Pe W And/or Wo Contrast  Result Date: 03/27/2019 CLINICAL DATA:  PE suspected, high pretest prob. Diagnosis of pulmonary embolus 6 weeks ago. EXAM: CT ANGIOGRAPHY CHEST WITH CONTRAST TECHNIQUE: Multidetector CT imaging of the chest was performed using the  standard protocol during bolus administration of intravenous contrast. Multiplanar CT image reconstructions and MIPs were obtained to evaluate the vascular anatomy. CONTRAST:  100mL OMNIPAQUE IOHEXOL 350 MG/ML SOLN COMPARISON:  Chest radiograph earlier this day. Chest CT 02/08/2019 FINDINGS: Cardiovascular: Improved thromboembolic burden compared to prior CT, small filling defect persists in the left lower lobar and segmental pulmonary artery. The remainder of the pulmonary arterial filling defects have resolved. There is no right heart strain. RV to LV ratio is less than 1. Heart is normal in size. Thoracic aorta is normal in caliber, cannot assess for dissection given phase of IV contrast. Mild atherosclerosis. Mediastinum/Nodes: No enlarged mediastinal or hilar lymph nodes. Tiny hiatal hernia. No visualized thyroid nodule. Lungs/Pleura: Linear atelectasis in both lower lobes. No confluent airspace disease or pulmonary infarct. No pleural fluid. No pulmonary edema. Upper Abdomen: No acute abnormality. Mild chronic elevation of right hemidiaphragm. Musculoskeletal: There are no acute or suspicious osseous abnormalities. Review of the MIP images confirms the above findings. IMPRESSION: 1. Significant improvement in bilateral pulmonary emboli since prior exam. Small volume residual thrombus in the left lower lobar and segmental pulmonary arteries. The remainder of the pulmonary arterial filling defects have resolved. 2. Mild bilateral lower lobe atelectasis. Aortic Atherosclerosis (ICD10-I70.0). Electronically Signed  By: Narda RutherfordMelanie  Sanford M.D.   On: 03/27/2019 02:01    ____________________________________________   PROCEDURES  Procedure(s) performed:   Procedures   ____________________________________________   INITIAL IMPRESSION / ASSESSMENT AND PLAN / ED COURSE  eval voe e/o new PE vs e/o Pulm HTN. Cards appt in AM if all ok.   No PE. Possibly RHF? Paroxysmal Afib? Needs monitor and echo. Has  cards follow up in AM.    Pertinent labs & imaging results that were available during my care of the patient were reviewed by me and considered in my medical decision making (see chart for details).  A medical screening exam was performed and I feel the patient has had an appropriate workup for their chief complaint at this time and likelihood of emergent condition existing is low. They have been counseled on decision, discharge, follow up and which symptoms necessitate immediate return to the emergency department. They or their family verbally stated understanding and agreement with plan and discharged in stable condition.   ____________________________________________  FINAL CLINICAL IMPRESSION(S) / ED DIAGNOSES  Final diagnoses:  Palpitations  Dyspnea, unspecified type     MEDICATIONS GIVEN DURING THIS VISIT:  Medications  sodium chloride flush (NS) 0.9 % injection 3 mL (has no administration in time range)  iohexol (OMNIPAQUE) 350 MG/ML injection 80 mL (100 mLs Intravenous Contrast Given 03/27/19 0113)     NEW OUTPATIENT MEDICATIONS STARTED DURING THIS VISIT:  Discharge Medication List as of 03/27/2019  2:14 AM      Note:  This note was prepared with assistance of Dragon voice recognition software. Occasional wrong-word or sound-a-like substitutions may have occurred due to the inherent limitations of voice recognition software.   Mahlet Jergens, Barbara CowerJason, MD 03/27/19 [phone]

## 2019-03-27 NOTE — Progress Notes (Signed)
CARDIOLOGY CONSULT NOTE  Patient ID: Sarah Chung MRN: [id number] DOB/AGE: 60/06/1961 60 y.o.  Admit date: (Not on file) Primary Physician: Cory Munch, PA-C   Reason for Consultation: Shortness of breath  HPI: Sarah Chung is a 58 y.o. female who is being seen today for the evaluation of shortness of breath at the request of Cory Munch, PA-C.   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  She had bulky bilateral clot burden and left eccentric saddle embolus.  She was started on heparin and transitioned to Xarelto.  Lower extremity Dopplers demonstrated acute occlusive DVT in the right popliteal and calf veins.  She has a prior history of left leg DVT in 1993 a few days after a C-section.  She then presented to the ED at [facility] early this morning with complaints of palpitations.  She also complained of dyspnea on exertion and fatigue with tachycardia with minimal exertion.  Blood pressure 133/102 and heart rate 83 in the ED.  O2 sats were 100%.  Chest x-ray was normal.  CT angiography of the chest showed significant improvement in bilateral pulmonary emboli since prior exam with small volume residual thrombus in the left lower lobar and segmental pulmonary arteries.  There was mild bilateral lower lobe atelectasis.  BNP and high-sensitivity troponin were normal.  CBC was normal.  I personally reviewed the ECG performed earlier this morning which demonstrated sinus rhythm.  There were no ischemic ST segment or T wave abnormalities nor any arrhythmias.  Echocardiogram on 02/09/2019 demonstrated normal left ventricular systolic function, LVEF 60 to 65%, mild LVH, grade 1 diastolic dysfunction with indeterminate filling pressures, normal regional wall motion, with low normal right ventricular systolic function.  There was trivial tricuspid regurgitation.  She tells me that ever since her diagnosis of  pulmonary embolism, her heart rate quickly accelerates up to 148 bpm when she walks up a flight of stairs in her house.  When she is walking on level ground or resting comfortably she denies tachycardia and palpitations.  She has occasional feelings of indigestion and it even occurred at the time I was obtaining her history.  She denies exertional chest pain.  She denies orthopnea, leg swelling, and syncope.  She said she has normally been very athletic and likes to sing and dance and perform.  She has put on weight during the [place] pandemic and would like to get back to exercising.  She has been using a pulse ox to monitor her heart rate.  She has some daytime somnolence but denies morning headaches and she said her husband does not tell her that she snores.  Social history: She is a Cabin crew.  She has been married for over 30 years.  Her husband is a Museum/gallery curator.  Her daughter has postpartum depression and anxiety.  The patient is originally from [place].  Her brother lives in [place].  Family history: Brother underwent CABG at age 31.  Father had an LAD stent.   Allergies  Allergen Reactions  . Tegretol [Carbamazepine] Hives  . Keppra [Levetiracetam]     Feels outside her body.[name]  . Sulfa Antibiotics Hives    Current Outpatient Medications  Medication Sig Dispense Refill  . ibuprofen (ADVIL) 200 MG tablet Take 400 mg by mouth daily as needed for mild pain or moderate pain.    Marland Kitchen PHENobarbital (LUMINAL) 64.8 MG tablet TAKE 2 TABLETS BY MOUTH AT  BEDTIME (Patient taking differently: Take 139.6 mg by mouth at bedtime. ) 180 tablet 1  . phentermine (ADIPEX-P) 37.5 MG tablet Take 37.5 mg by mouth daily as needed (to curve appetite).   1  . XARELTO 20 MG TABS tablet Take 1 tablet by mouth daily.     No current facility-administered medications for this visit.     Past Medical History:  Diagnosis Date  . DVT (deep vein thrombosis) in pregnancy   . Gallstones   . Pulmonary embolism  ([facility])   . Seizures ([facility])     Past Surgical History:  Procedure Laterality Date  . CHOLECYSTECTOMY N/A 07/02/2013   Procedure: LAPAROSCOPIC CHOLECYSTECTOMY WITH INTRAOPERATIVE CHOLANGIOGRAM;  Surgeon: Ernestene MentionHaywood M Ingram, MD;  Location: [facility] ORS;  Service: General;  Laterality: N/A;    Social History   Socioeconomic History  . Marital status: Married    Spouse name: Sarah Chung  . Number of children: 2  . Years of education: College  . Highest education level: Not on file  Occupational History  . Occupation: Musicianrealtor/actor    Employer: [employer]  Social Needs  . Financial resource strain: Not on file  . Food insecurity    Worry: Not on file    Inability: Not on file  . Transportation needs    Medical: Not on file    Non-medical: Not on file  Tobacco Use  . Smoking status: Never Smoker  . Smokeless tobacco: Never Used  Substance and Sexual Activity  . Alcohol use: No  . Drug use: No  . Sexual activity: Yes    Birth control/protection: Post-menopausal  Lifestyle  . Physical activity    Days per week: Not on file    Minutes per session: Not on file  . Stress: Not on file  Relationships  . Social Musicianconnections    Talks on phone: Not on file    Gets together: Not on file    Attends religious service: Not on file    Active member of club or organization: Not on file    Attends meetings of clubs or organizations: Not on file    Relationship status: Not on file  . Intimate partner violence    Fear of current or ex partner: Not on file    Emotionally abused: Not on file    Physically abused: Not on file    Forced sexual activity: Not on file  Other Topics Concern  . Not on file  Social History Narrative   Patient is married ([name]) and lives at home with his husband.   Patient has two adult children.   Patient is working full-time.   Patient has a college education.   Patient is right-handed.   Patient drinks two cups of coffee and some soda.      Current Meds   Medication Sig  . ibuprofen (ADVIL) 200 MG tablet Take 400 mg by mouth daily as needed for mild pain or moderate pain.  Marland Kitchen. PHENobarbital (LUMINAL) 64.8 MG tablet TAKE 2 TABLETS BY MOUTH AT BEDTIME (Patient taking differently: Take 139.6 mg by mouth at bedtime. )  . phentermine (ADIPEX-P) 37.5 MG tablet Take 37.5 mg by mouth daily as needed (to curve appetite).   Carlena Hurl. XARELTO 20 MG TABS tablet Take 1 tablet by mouth daily.  . [DISCONTINUED] Rivaroxaban 15 & 20 MG TBPK Take 15 mg by mouth 2 (two) times a day. Take as directed on package: Start with one 15mg  tablet by mouth twice a day with food. On  Day 22, switch to one 20mg  tablet once a day with food. (Patient taking differently: Take 15-20 mg by mouth See admin instructions. Take as directed on package: Start with one 15mg  tablet by mouth twice a day with food. On Day 22, switch to one 20mg  tablet once a day with food.)      Review of systems complete and found to be negative unless listed above in HPI    Physical exam Blood pressure 123/86, pulse 80, height 5\' 4"  (1.626 m), weight 196 lb 12.8 oz (89.3 kg), SpO2 98 %. General: NAD Neck: No JVD, no thyromegaly or thyroid nodule.  Lungs: Clear to auscultation bilaterally with normal respiratory effort. CV: Nondisplaced PMI. Regular rate and rhythm, normal S1/S2, no S3/S4, no murmur.  No peripheral edema.  No carotid bruit.    Abdomen: Soft, nontender, no distention.  Skin: Intact without lesions or rashes.  Neurologic: Alert and oriented x 3.  Psych: Normal affect. Extremities: No clubbing or cyanosis.  HEENT: Normal.   ECG: Most recent ECG reviewed.   Labs: Lab Results  Component Value Date/Time   K 3.6 03/26/2019 11:38 PM   BUN 22 (H) 03/26/2019 11:38 PM   BUN 19 06/17/2016 11:48 AM   CREATININE 1.07 (H) 03/26/2019 11:38 PM   ALT 20 06/17/2016 11:48 AM   HGB 13.3 03/26/2019 11:38 PM     Lipids: No results found for: LDLCALC, LDLDIRECT, CHOL, TRIG, HDL      ASSESSMENT AND  PLAN:  1.  Tachycardia and exertional dyspnea: As the symptoms did not occur prior to the diagnosis of pulmonary embolism, I told her that a rapid accelerating normal heart rate going up a flight of stairs would be quite normal as she still has a long way to recover and there is some residual thrombus in her pulmonary vessels even though the clot burden has decreased.  I suspect she has a normal sinus tachycardia after climbing a flight of stairs.  As she does not have palpitations at rest and her heart rate is normal when walking on level ground and when resting, I doubt an alternative cardiac arrhythmia.  I encouraged her to walk up stairs slowly and encouraged her to continue walking as much as possible on level ground.  Echocardiogram reviewed above with normal LV systolic function and low normal RV systolic function with no evidence of pulmonary hypertension.  2.  Chest discomfort: She has a strong family history of premature coronary artery disease.  She does have a rapidly accelerating heart rate when walking up a flight of stairs as described #1.  While this is likely to pulmonary embolism and residual thrombus, I do not want to overlook occult ischemic heart disease.  I will obtain coronary CT angiography.  3.  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  She follows with hematology.     Disposition: Follow up in 8-10 weeks months  Signed: Prentice DockerSuresh Katie Moch, M.D., F.A.C.C.  03/27/2019, 9:23 AM

## 2019-03-27 NOTE — ED Notes (Signed)
Pt to CT via WC

## 2019-03-27 NOTE — Patient Instructions (Addendum)
Your physician recommends that you schedule a follow-up appointment in: [place] physician recommends that you continue on your current medications as directed. Please refer to the Current Medication list given to you today.  Your cardiac CT will be scheduled at one of the below locations:   [facility] [address] [place], [place] [address] [phone]) [place] [address] [place], [place] [address] [phone]  Please arrive at the [facility] main entrance of [facility] 30-45 minutes prior to test start time. Proceed to the [facility] Radiology Department (first floor) to check-in and test prep.  Please follow these instructions carefully (unless otherwise directed):  TAKE LOPRESSOR 100 MG ONCE 2 HOURS PRIOR TO TEST   On the Night Before the Test: . Be sure to Drink plenty of water. . Do not consume any caffeinated/decaffeinated beverages or chocolate 12 hours prior to your test. . Do not take any antihistamines 12 hours prior to your test. . If you take Metformin do not take 24 hours prior to test.  On the Day of the Test: . Drink plenty of water. Do not drink any water within one hour of the test. . Do not eat any food 4 hours prior to the test. . You may take your regular medications prior to the test.  . Take metoprolol (Lopressor) two hours prior to test. . HOLD Furosemide/Hydrochlorothiazide morning of the test. . FEMALES- please wear underwire-free bra if available        After the Test: . Drink plenty of water. . After receiving IV contrast, you may experience a mild flushed feeling. This is normal. . On occasion, you may experience a mild rash up to 24 hours after the test. This is not dangerous. If this occurs, you can take Benadryl 25 mg and increase your fluid intake. . If you experience trouble breathing, this can be serious. If it is severe call  [phone] IMMEDIATELY. If it is mild, please call our office. . If you take any of these medications: Glipizide/Metformin, Avandament, Glucavance, please do not take 48 hours after completing test.    Please contact the cardiac imaging nurse navigator should you have any questions/concerns Marchia Bond, RN Navigator Cardiac [facility] [phone] Office  [phone] Cell

## 2019-04-05 ENCOUNTER — Telehealth (HOSPITAL_COMMUNITY): Payer: Self-pay | Admitting: Emergency Medicine

## 2019-04-05 NOTE — Telephone Encounter (Signed)
Reaching out to patient to offer assistance regarding upcoming cardiac imaging study; pt verbalizes understanding of appt date/time, parking situation and where to check in, pre-test NPO status and medications ordered, and verified current allergies; name and call back number provided for further questions should they arise Zackery Brine RN Navigator Cardiac Imaging [facility] [phone] office [phone] cell 

## 2019-04-06 ENCOUNTER — Ambulatory Visit (HOSPITAL_COMMUNITY)
Admission: RE | Admit: 2019-04-06 | Discharge: 2019-04-06 | Disposition: A | Discharge status: Home / Self Care | Payer: Self-pay | Source: Ambulatory Visit | Attending: Cardiovascular Disease | Admitting: Cardiovascular Disease

## 2019-04-06 ENCOUNTER — Other Ambulatory Visit: Payer: Self-pay

## 2019-04-06 DIAGNOSIS — I209 Angina pectoris, unspecified: Secondary | ICD-10-CM

## 2019-04-06 MED ORDER — NITROGLYCERIN 0.4 MG SL SUBL
SUBLINGUAL_TABLET | SUBLINGUAL | Status: AC
  Filled 2019-04-06: qty 2

## 2019-04-06 MED ORDER — NITROGLYCERIN 0.4 MG SL SUBL
0.8000 mg | SUBLINGUAL_TABLET | Freq: Once | SUBLINGUAL | Status: AC
  Administered 2019-04-06: 16:00:00 0.8 mg via SUBLINGUAL
  Filled 2019-04-06: qty 25

## 2019-04-11 ENCOUNTER — Telehealth (INDEPENDENT_AMBULATORY_CARE_PROVIDER_SITE_OTHER): Payer: Self-pay | Admitting: Cardiovascular Disease

## 2019-04-11 ENCOUNTER — Encounter: Payer: Self-pay | Admitting: Cardiovascular Disease

## 2019-04-11 VITALS — Ht 64.0 in | Wt 191.0 lb

## 2019-04-11 DIAGNOSIS — R931 Abnormal findings on diagnostic imaging of heart and coronary circulation: Secondary | ICD-10-CM

## 2019-04-11 DIAGNOSIS — R000 Tachycardia, unspecified: Secondary | ICD-10-CM

## 2019-04-11 DIAGNOSIS — I2692 Saddle embolus of pulmonary artery without acute cor pulmonale: Secondary | ICD-10-CM

## 2019-04-11 DIAGNOSIS — R0609 Other forms of dyspnea: Secondary | ICD-10-CM

## 2019-04-11 DIAGNOSIS — I209 Angina pectoris, unspecified: Secondary | ICD-10-CM

## 2019-04-11 MED ORDER — ATORVASTATIN CALCIUM 80 MG PO TABS: 80.0000 mg | ORAL_TABLET | Freq: Every day | ORAL | 3 refills | Status: DC

## 2019-04-11 NOTE — Progress Notes (Signed)
Virtual Visit via Video Note (ultimately converted to a phone visit due to technical difficulties)   This visit type was conducted due to national recommendations for restrictions regarding the COVID-19 Pandemic (e.g. social distancing) in an effort to limit this patient's exposure and mitigate transmission in our community.  Due to her co-morbid illnesses, this patient is at least at moderate risk for complications without adequate follow up.  This format is felt to be most appropriate for this patient at this time.  All issues noted in this document were discussed and addressed.  A limited physical exam was performed with this format.  Please refer to the patient's chart for her consent to telehealth for [facility].   Date:  04/11/2019   ID:  Sarah Chung, DOB Jun 25, 1961, MRN [id number]  Patient Location: Home Provider Location: Office  PCP:  Shawnie DapperMann, Benjamin L, PA-C  Cardiologist:  Prentice DockerSuresh Koneswaran, MD  Electrophysiologist:  None   Evaluation Performed:  Follow-Up Visit  Chief Complaint: Chest discomfort  History of Present Illness:    Sarah CahillRose M Augenstein is a 58 y.o. female with chest discomfort.  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  She had bulky bilateral clot burden and left eccentric saddle embolus.  She was started on heparin and transitioned to Xarelto.  Lower extremity Dopplers demonstrated acute occlusive DVT in the right popliteal and calf veins.  She has a prior history of left leg DVT in 1993 a few days after a C-section.  At her initial visit on 03/27/2019, I arrange for coronary CT angiography.  The results are detailed below but showed significant stenosis in the proximal LAD and proximal left circumflex coronary arteries.  Her daughter was present throughout the conversation.  The patient had several questions regarding the coronary CT findings as well as neck steps in management.  We talked about cardiac  catheterization and the likely need for stents.  She had several questions about this as well which we discussed in great detail.  She said she has been very anxious since obtaining these results.  She carried a bag of groceries up her steps to the house and again her heart rate quickly accelerated and she became short of breath.  The patient does not have symptoms concerning for COVID-19 infection (fever, chills, cough).   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  Her husband is a Proofreaderbuilder.  Her daughter has postpartum depression and anxiety.  The patient is originally from [place].  Her brother lives in [place].  Family history: Brother underwent CABG at age 60.  Father had an LAD stent.  Past Medical History:  Diagnosis Date  . DVT (deep vein thrombosis) in pregnancy   . Gallstones   . Pulmonary embolism (HCC)   . Seizures (HCC)    Past Surgical History:  Procedure Laterality Date  . CHOLECYSTECTOMY N/A 07/02/2013   Procedure: LAPAROSCOPIC CHOLECYSTECTOMY WITH INTRAOPERATIVE CHOLANGIOGRAM;  Surgeon: Ernestene MentionHaywood M Ingram, MD;  Location: [facility];  Service: General;  Laterality: N/A;     Current Meds  Medication Sig  . ibuprofen (ADVIL) 200 MG tablet Take 400 mg by mouth daily as needed for mild pain or moderate pain.  . metoprolol tartrate (LOPRESSOR) 100 MG tablet TAKE 1 TABLET 2 HOURS PRIOR TO PROCEDURE  . PHENobarbital (LUMINAL) 64.8 MG tablet TAKE 2 TABLETS BY MOUTH AT BEDTIME (Patient taking differently: Take 139.6 mg by mouth at bedtime. )  .  phentermine (ADIPEX-P) 37.5 MG tablet Take 37.5 mg by mouth daily as needed (to curve appetite).   . XARELTO 20 MG TABS tablet Take 1 tablet by mouth daily.     Allergies:   Tegretol [carbamazepine], Keppra [levetiracetam], and Sulfa antibiotics   Social History   Tobacco Use  . Smoking status: Never Smoker  . Smokeless tobacco: Never Used  Substance Use Topics  . Alcohol use: No  . Drug use:  No     Family Hx: The patient's family history includes Heart attack in her father; Heart disease in her brother; Parkinson's disease in her father.  ROS:   Please see the history of present illness.     All other systems reviewed and are negative.   Prior CV studies:   The following studies were reviewed today:  Echocardiogram on 02/09/2019 demonstrated normal left ventricular systolic function, LVEF 60 to 65%, mild LVH, grade 1 diastolic dysfunction with indeterminate filling pressures, normal regional wall motion, with low normal right ventricular systolic function.  There was trivial tricuspid regurgitation.  Coronary CT angiography 04/06/2019:  Coronary Arteries:  Normal coronary origin.  Right dominance.  Left main: The left main is a large caliber vessel with a normal take off from the left coronary cusp that bifurcates to form a left anterior descending artery and a left circumflex artery. There is no plaque or stenosis.  Left anterior descending artery: There is a severe mixed density stenosis in the prox (70-99%). The mid LAD contains mild calcified plaque (25-49%). The distal LAD contains minimal calcified plaque (<25%). The LAD gives off 2 patent diagonal branches.  Left circumflex artery: The proximal LCX contains moderate non-calcified plaque (50-69%). There is minimal calcified plaque distal to this lesion (<25%). The LCX terminates as a large patent obtuse marginal branch.  Right coronary artery: The RCA is dominant. The mRCA contains mild calcified plaque (25-49%). The distal RCA contains mild non-calcified plaque (25-49%).The RCA terminates as a PDA and right posterolateral branch without evidence of plaque or stenosis.  Right Atrium: Right atrial size is dilated.  Right Ventricle: The right ventricular cavity is dilated.  Left Atrium: Left atrial size is normal in size with no left atrial appendage filling defect.  Left Ventricle: The  ventricular cavity size is within normal limits. There are no stigmata of prior infarction. There is no abnormal filling defect.  Pulmonary veins: Normal pulmonary venous drainage. 1 common left vein and 2 right veins.  Pericardium: Normal thickness with no significant effusion or calcium present.  Cardiac valves: The aortic valve is trileaflet without significant calcification. The mitral valve is normal structure without significant calcification.  Ascending aorta: Normal caliber with no significant disease.  Extra-cardiac findings: See attached radiology report for non-cardiac structures.  IMPRESSION: 1. Coronary calcium score of 307. This was 97th percentile for age and sex matched control.  2. Severe stenosis in the proximal LAD and moderate stenosis in the proximal LCX.  3. Dilated right atrium and right ventricle.  RECOMMENDATIONS: 1. CAD-RADS 4: Cardiac catheterization is recommended to evaluate the LAD lesion. CT-FFR will be sent to determine significance of the LCX.  FFR Analysis:  FINDINGS: FFRct analysis was performed on the original cardiac CT angiogram dataset. Diagrammatic representation of the FFRct analysis is provided in a separate PDF document in PACS. This dictation was created using the PDF document and an interactive 3D model of the results. 3D model is not available in the EMR/PACS. Normal FFR range is >0.80.  1. Left Main:   No significant stenosis.  2. LAD: FFR across pLAD lesion 0.79, indicating significant stenosis. 3. LCX: FFR across pLCX lesion is 0.71, indicating significant stenosis. 4. RCA: no significant stenosis.  IMPRESSION: 1.  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   Labs/Other Tests and Data Reviewed:    EKG:  No ECG reviewed.  Recent Labs: 03/26/2019: B Natriuretic Peptide 14.8; BUN 22; Creatinine, Ser 1.07; Hemoglobin 13.3; Platelets 299; Potassium 3.6; Sodium 136   Recent Lipid  Panel No results found for: CHOL, TRIG, HDL, CHOLHDL, LDLCALC, LDLDIRECT  Wt Readings from Last 3 Encounters:  04/11/19 191 lb (86.6 kg)  03/27/19 196 lb 12.8 oz (89.3 kg)  03/26/19 192 lb 3.9 oz (87.2 kg)     Objective:    Vital Signs:  Ht 5\' 4"  (1.626 m)   Wt 191 lb (86.6 kg)   BMI 32.79 kg/m    VITAL SIGNS:  reviewed  ASSESSMENT & PLAN:    1.  Abnormal coronary CT angiography: As detailed above, findings are suggestive of significant proximal LAD and proximal left circumflex coronary artery stenoses.  She has been experiencing chest discomfort which is suggestive of angina.  She is taking Xarelto.  For this reason I will not start aspirin at this time.  I will start atorvastatin 80 mg.  I will arrange for left heart catheterization and coronary angiography. Risks and benefits of cardiac catheterization have been discussed with the patient.  These include bleeding, infection, kidney damage, stroke, heart attack, death.  The patient understands these risks and is willing to proceed. Xarelto will need to be held and she will need bridging therapy with Lovenox.  2.  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  She follows with hematology.    COVID-19 Education: The signs and symptoms of COVID-19 were discussed with the patient and how to seek care for testing (follow up with PCP or arrange E-visit).  The importance of social distancing was discussed today.  Time:   Today, I have spent 40 minutes with the patient with telehealth technology discussing the above problems.     Medication Adjustments/Labs and Tests Ordered: Current medicines are reviewed at length with the patient today.  Concerns regarding medicines are outlined above.   Tests Ordered: No orders of the defined types were placed in this encounter.   Medication Changes: No orders of the defined types were placed in this encounter.   Follow Up:  In Person in 1 month(s) after cath  Signed, Kate Sable, MD  04/11/2019 3:34 PM    [place]

## 2019-04-11 NOTE — Patient Instructions (Addendum)
Medication Instructions:   hold xarelto   Start lipitor 80 mg daily   Labwork: Will call tomorrow  Testing/Procedures: Your physician has requested that you have a cardiac catheterization. Cardiac catheterization is used to diagnose and/or treat various heart conditions. Doctors may recommend this procedure for a number of different reasons. The most common reason is to evaluate chest pain. Chest pain can be a symptom of coronary artery disease (CAD), and cardiac catheterization can show whether plaque is narrowing or blocking your heart's arteries. This procedure is also used to evaluate the valves, as well as measure the blood flow and oxygen levels in different parts of your heart. For further information please visit [email or web address]. Please follow instruction sheet, as given.    Follow-Up: Your physician recommends that you schedule a follow-up appointment in:  1 month post cath    Any Other Special Instructions Will Be Listed Below (If Applicable).  I will call patient tomorrow with details concerning cath.     If you need a refill on your cardiac medications before your next appointment, please call your pharmacy.

## 2019-04-11 NOTE — H&P (View-Only) (Signed)
Virtual Visit via Video Note (ultimately converted to a phone visit due to technical difficulties)   This visit type was conducted due to national recommendations for restrictions regarding the COVID-19 Pandemic (e.g. social distancing) in an effort to limit this patient's exposure and mitigate transmission in our community.  Due to her co-morbid illnesses, this patient is at least at moderate risk for complications without adequate follow up.  This format is felt to be most appropriate for this patient at this time.  All issues noted in this document were discussed and addressed.  A limited physical exam was performed with this format.  Please refer to the patient's chart for her consent to telehealth for [facility].   Date:  04/11/2019   ID:  Sarah Cahillose M Chung, DOB Jun 25, 1961, MRN [id number]  Patient Location: Home Provider Location: Office  PCP:  Shawnie DapperMann, Benjamin L, PA-C  Cardiologist:  Prentice DockerSuresh Shalom Mcguiness, MD  Electrophysiologist:  None   Evaluation Performed:  Follow-Up Visit  Chief Complaint: Chest discomfort  History of Present Illness:    Sarah Chung is a 58 y.o. female with chest discomfort.  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  She had bulky bilateral clot burden and left eccentric saddle embolus.  She was started on heparin and transitioned to Xarelto.  Lower extremity Dopplers demonstrated acute occlusive DVT in the right popliteal and calf veins.  She has a prior history of left leg DVT in 1993 a few days after a C-section.  At her initial visit on 03/27/2019, I arrange for coronary CT angiography.  The results are detailed below but showed significant stenosis in the proximal LAD and proximal left circumflex coronary arteries.  Her daughter was present throughout the conversation.  The patient had several questions regarding the coronary CT findings as well as neck steps in management.  We talked about cardiac  catheterization and the likely need for stents.  She had several questions about this as well which we discussed in great detail.  She said she has been very anxious since obtaining these results.  She carried a bag of groceries up her steps to the house and again her heart rate quickly accelerated and she became short of breath.  The patient does not have symptoms concerning for COVID-19 infection (fever, chills, cough).   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  Her husband is a Proofreaderbuilder.  Her daughter has postpartum depression and anxiety.  The patient is originally from [place].  Her brother lives in [place].  Family history: Brother underwent CABG at age 60.  Father had an LAD stent.  Past Medical History:  Diagnosis Date  . DVT (deep vein thrombosis) in pregnancy   . Gallstones   . Pulmonary embolism (HCC)   . Seizures (HCC)    Past Surgical History:  Procedure Laterality Date  . CHOLECYSTECTOMY N/A 07/02/2013   Procedure: LAPAROSCOPIC CHOLECYSTECTOMY WITH INTRAOPERATIVE CHOLANGIOGRAM;  Surgeon: Ernestene MentionHaywood M Ingram, MD;  Location: [facility];  Service: General;  Laterality: N/A;     Current Meds  Medication Sig  . ibuprofen (ADVIL) 200 MG tablet Take 400 mg by mouth daily as needed for mild pain or moderate pain.  . metoprolol tartrate (LOPRESSOR) 100 MG tablet TAKE 1 TABLET 2 HOURS PRIOR TO PROCEDURE  . PHENobarbital (LUMINAL) 64.8 MG tablet TAKE 2 TABLETS BY MOUTH AT BEDTIME (Patient taking differently: Take 139.6 mg by mouth at bedtime. )  .  phentermine (ADIPEX-P) 37.5 MG tablet Take 37.5 mg by mouth daily as needed (to curve appetite).   Carlena Hurl 20 MG TABS tablet Take 1 tablet by mouth daily.     Allergies:   Tegretol [carbamazepine], Keppra [levetiracetam], and Sulfa antibiotics   Social History   Tobacco Use  . Smoking status: Never Smoker  . Smokeless tobacco: Never Used  Substance Use Topics  . Alcohol use: No  . Drug use:  No     Family Hx: The patient's family history includes Heart attack in her father; Heart disease in her brother; Parkinson's disease in her father.  ROS:   Please see the history of present illness.     All other systems reviewed and are negative.   Prior CV studies:   The following studies were reviewed today:  Echocardiogram on 02/09/2019 demonstrated normal left ventricular systolic function, LVEF 60 to 65%, mild LVH, grade 1 diastolic dysfunction with indeterminate filling pressures, normal regional wall motion, with low normal right ventricular systolic function.  There was trivial tricuspid regurgitation.  Coronary CT angiography 04/06/2019:  Coronary Arteries:  Normal coronary origin.  Right dominance.  Left main: The left main is a large caliber vessel with a normal take off from the left coronary cusp that bifurcates to form a left anterior descending artery and a left circumflex artery. There is no plaque or stenosis.  Left anterior descending artery: There is a severe mixed density stenosis in the prox (70-99%). The mid LAD contains mild calcified plaque (25-49%). The distal LAD contains minimal calcified plaque (<25%). The LAD gives off 2 patent diagonal branches.  Left circumflex artery: The proximal LCX contains moderate non-calcified plaque (50-69%). There is minimal calcified plaque distal to this lesion (<25%). The LCX terminates as a large patent obtuse marginal branch.  Right coronary artery: The RCA is dominant. The mRCA contains mild calcified plaque (25-49%). The distal RCA contains mild non-calcified plaque (25-49%).The RCA terminates as a PDA and right posterolateral branch without evidence of plaque or stenosis.  Right Atrium: Right atrial size is dilated.  Right Ventricle: The right ventricular cavity is dilated.  Left Atrium: Left atrial size is normal in size with no left atrial appendage filling defect.  Left Ventricle: The  ventricular cavity size is within normal limits. There are no stigmata of prior infarction. There is no abnormal filling defect.  Pulmonary veins: Normal pulmonary venous drainage. 1 common left vein and 2 right veins.  Pericardium: Normal thickness with no significant effusion or calcium present.  Cardiac valves: The aortic valve is trileaflet without significant calcification. The mitral valve is normal structure without significant calcification.  Ascending aorta: Normal caliber with no significant disease.  Extra-cardiac findings: See attached radiology report for non-cardiac structures.  IMPRESSION: 1. Coronary calcium score of 307. This was 97th percentile for age and sex matched control.  2. Severe stenosis in the proximal LAD and moderate stenosis in the proximal LCX.  3. Dilated right atrium and right ventricle.  RECOMMENDATIONS: 1. CAD-RADS 4: Cardiac catheterization is recommended to evaluate the LAD lesion. CT-FFR will be sent to determine significance of the LCX.  FFR Analysis:  FINDINGS: FFRct analysis was performed on the original cardiac CT angiogram dataset. Diagrammatic representation of the FFRct analysis is provided in a separate PDF document in PACS. This dictation was created using the PDF document and an interactive 3D model of the results. 3D model is not available in the EMR/PACS. Normal FFR range is >0.80.  1. Left Main:  No significant stenosis.  2. LAD: FFR across pLAD lesion 0.79, indicating significant stenosis. 3. LCX: FFR across pLCX lesion is 0.71, indicating significant stenosis. 4. RCA: no significant stenosis.  IMPRESSION: 1.  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   Labs/Other Tests and Data Reviewed:    EKG:  No ECG reviewed.  Recent Labs: 03/26/2019: B Natriuretic Peptide 14.8; BUN 22; Creatinine, Ser 1.07; Hemoglobin 13.3; Platelets 299; Potassium 3.6; Sodium 136   Recent Lipid  Panel No results found for: CHOL, TRIG, HDL, CHOLHDL, LDLCALC, LDLDIRECT  Wt Readings from Last 3 Encounters:  04/11/19 191 lb (86.6 kg)  03/27/19 196 lb 12.8 oz (89.3 kg)  03/26/19 192 lb 3.9 oz (87.2 kg)     Objective:    Vital Signs:  Ht 5\' 4"  (1.626 m)   Wt 191 lb (86.6 kg)   BMI 32.79 kg/m    VITAL SIGNS:  reviewed  ASSESSMENT & PLAN:    1.  Abnormal coronary CT angiography: As detailed above, findings are suggestive of significant proximal LAD and proximal left circumflex coronary artery stenoses.  She has been experiencing chest discomfort which is suggestive of angina.  She is taking Xarelto.  For this reason I will not start aspirin at this time.  I will start atorvastatin 80 mg.  I will arrange for left heart catheterization and coronary angiography. Risks and benefits of cardiac catheterization have been discussed with the patient.  These include bleeding, infection, kidney damage, stroke, heart attack, death.  The patient understands these risks and is willing to proceed. Xarelto will need to be held and she will need bridging therapy with Lovenox.  2.  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  She follows with hematology.    COVID-19 Education: The signs and symptoms of COVID-19 were discussed with the patient and how to seek care for testing (follow up with PCP or arrange E-visit).  The importance of social distancing was discussed today.  Time:   Today, I have spent 40 minutes with the patient with telehealth technology discussing the above problems.     Medication Adjustments/Labs and Tests Ordered: Current medicines are reviewed at length with the patient today.  Concerns regarding medicines are outlined above.   Tests Ordered: No orders of the defined types were placed in this encounter.   Medication Changes: No orders of the defined types were placed in this encounter.   Follow Up:  In Person in 1 month(s) after cath  Signed, Kate Sable, MD  04/11/2019 3:34 PM    [place]

## 2019-04-11 NOTE — Addendum Note (Signed)
Addended by: Debbora Lacrosse R on: 04/11/2019 04:25 PM   Modules accepted: Orders

## 2019-04-12 ENCOUNTER — Telehealth: Payer: Self-pay | Admitting: Cardiovascular Disease

## 2019-04-12 ENCOUNTER — Encounter (HOSPITAL_COMMUNITY): Payer: Self-pay

## 2019-04-12 ENCOUNTER — Other Ambulatory Visit: Payer: Self-pay

## 2019-04-12 ENCOUNTER — Other Ambulatory Visit: Payer: Self-pay | Admitting: Cardiovascular Disease

## 2019-04-12 DIAGNOSIS — Z01818 Encounter for other preprocedural examination: Secondary | ICD-10-CM

## 2019-04-12 DIAGNOSIS — R931 Abnormal findings on diagnostic imaging of heart and coronary circulation: Secondary | ICD-10-CM

## 2019-04-12 MED ORDER — SODIUM CHLORIDE 0.9% FLUSH: 3.0000 mL | Freq: Two times a day (BID) | INTRAVENOUS | Status: DC

## 2019-04-12 NOTE — Telephone Encounter (Signed)
Spoke with Lattie Haw, RN in [place] office. Pt is pending cath, tentatively for 9/11. Will hold Xarelto for 3 days prior and bridge with Lovenox. Once procedure date is confirmed and scheduled, will reach out to pt via telephone to review Lovenox instructions as follows:  86.6kg, 1.5mg /kg once daily dosing >> 130 kg, round to 120mg  syringe size   9/7: Last dose of Xarelto in the PM  9/8: Lovenox 120mg  SQ in the PM  9/9: Lovenox 120mg  SQ in the PM  9/10: no anticoagulation  9/11: procedure day, no anticoagulation (or resume Xarelto in evening if MD has cleared pt to)  9/12: resume Xarelto in PM

## 2019-04-12 NOTE — Telephone Encounter (Signed)
Pt is needing a Cath ASAP and is on Xarelto- she's needing to be bridged

## 2019-04-12 NOTE — Telephone Encounter (Signed)
Cath is scheduled for 9/11 @ 7:30 WITH DR. Tamala Julian. Labs & Covid testing is Tuesday @ 11:10 am @ short stay center at [facility].

## 2019-04-13 ENCOUNTER — Telehealth: Payer: Self-pay | Admitting: Cardiovascular Disease

## 2019-04-13 MED ORDER — ENOXAPARIN SODIUM 120 MG/0.8ML ~~LOC~~ SOLN: 120.0000 mg | SUBCUTANEOUS | 0 refills | Status: DC

## 2019-04-13 NOTE — Addendum Note (Signed)
Addended by: Marcelle Overlie D on: 04/13/2019 02:34 PM   Modules accepted: Orders

## 2019-04-13 NOTE — Telephone Encounter (Signed)
Called patient to discuss bridge. Patient daughter was not there. Answered a few questions about Xarelto. Advised to take in the evening with dinner. Patient also stated she experienced an event twice since taking atorvastatin that she smelled/tasted ammonia. I do not think this is related to medications. Advised ok to take both of those medications. She denies any other change in tate or smell or any other symptom such a SOB.   Patient will call back when she gets ahold of her daughter so she can hear instructions sine daughter will be giving injections

## 2019-04-13 NOTE — Telephone Encounter (Signed)
Spoke with patient and her daughter. Reviewed the lovenox injections and technique as displayed below.

## 2019-04-14 NOTE — Telephone Encounter (Signed)
Pre-cert Verification for the following procedure    Heart Cath scheduled for 04-20-2019 with Dr. Tamala Julian

## 2019-04-17 ENCOUNTER — Other Ambulatory Visit: Payer: Self-pay

## 2019-04-17 ENCOUNTER — Other Ambulatory Visit (HOSPITAL_COMMUNITY)
Admission: RE | Admit: 2019-04-17 | Discharge: 2019-04-17 | Disposition: A | Discharge status: Home / Self Care | Payer: Self-pay | Source: Ambulatory Visit | Attending: Interventional Cardiology | Admitting: Interventional Cardiology

## 2019-04-17 ENCOUNTER — Other Ambulatory Visit (HOSPITAL_COMMUNITY)
Admission: RE | Admit: 2019-04-17 | Discharge: 2019-04-17 | Disposition: A | Discharge status: Home / Self Care | Payer: Self-pay | Source: Ambulatory Visit | Attending: Cardiovascular Disease | Admitting: Cardiovascular Disease

## 2019-04-17 DIAGNOSIS — Z01812 Encounter for preprocedural laboratory examination: Secondary | ICD-10-CM | POA: Insufficient documentation

## 2019-04-17 DIAGNOSIS — Z20828 Contact with and (suspected) exposure to other viral communicable diseases: Secondary | ICD-10-CM | POA: Insufficient documentation

## 2019-04-17 DIAGNOSIS — R931 Abnormal findings on diagnostic imaging of heart and coronary circulation: Secondary | ICD-10-CM | POA: Insufficient documentation

## 2019-04-17 DIAGNOSIS — Z01818 Encounter for other preprocedural examination: Secondary | ICD-10-CM

## 2019-04-17 LAB — CBC WITH DIFFERENTIAL/PLATELET
Abs Immature Granulocytes: 0.02 10*3/uL (ref 0.00–0.07)
Basophils Absolute: 0 10*3/uL (ref 0.0–0.1)
Basophils Relative: 1 %
Eosinophils Absolute: 0.1 10*3/uL (ref 0.0–0.5)
Eosinophils Relative: 1 %
HCT: 41.2 % (ref 36.0–46.0)
Hemoglobin: 13.1 g/dL (ref 12.0–15.0)
Immature Granulocytes: 0 %
Lymphocytes Relative: 28 %
Lymphs Abs: 2.3 10*3/uL (ref 0.7–4.0)
MCH: 30.5 pg (ref 26.0–34.0)
MCHC: 31.8 g/dL (ref 30.0–36.0)
MCV: 95.8 fL (ref 80.0–100.0)
Monocytes Absolute: 0.5 10*3/uL (ref 0.1–1.0)
Monocytes Relative: 6 %
Neutro Abs: 5.2 10*3/uL (ref 1.7–7.7)
Neutrophils Relative %: 64 %
Platelets: 285 10*3/uL (ref 150–400)
RBC: 4.3 MIL/uL (ref 3.87–5.11)
RDW: 12.7 % (ref 11.5–15.5)
WBC: 8 10*3/uL (ref 4.0–10.5)
nRBC: 0 % (ref 0.0–0.2)

## 2019-04-17 LAB — BASIC METABOLIC PANEL
Anion gap: 9 (ref 5–15)
BUN: 19 mg/dL (ref 6–20)
CO2: 28 mmol/L (ref 22–32)
Calcium: 9.1 mg/dL (ref 8.9–10.3)
Chloride: 102 mmol/L (ref 98–111)
Creatinine, Ser: 1 mg/dL (ref 0.44–1.00)
GFR calc Af Amer: >60 mL/min (ref >60)
GFR calc non Af Amer: >60 mL/min (ref >60)
Glucose, Bld: 88 mg/dL (ref 70–99)
Potassium: 4.8 mmol/L (ref 3.5–5.1)
Sodium: 139 mmol/L (ref 135–145)

## 2019-04-18 ENCOUNTER — Telehealth: Payer: Self-pay

## 2019-04-18 LAB — SARS CORONAVIRUS 2 (TAT 6-24 HRS): SARS Coronavirus 2: NEGATIVE

## 2019-04-18 NOTE — Telephone Encounter (Signed)
Reviewed the following pre-procedure instructions with the patient. She verbalized understanding of the instructions and has no questions at this time.  Pt contacted pre-catheterization scheduled at [facility] for: 7:30 am  Verified arrival time and place: [facility]) at: 5:30 am  No solid food after midnight prior to cath, clear liquids until 5 AM day of procedure.  Contrast allergy: No  Verified no diabetes medications. Patient is currently not on any diabetic medications.  Patient instructed to hold her Xarelto dose for two days before the procedure.  AM meds can be  taken pre-cath with sip of water including: ASA 81 mg  Confirmed patient has responsible person to drive home post procedure and observe 24 hours after arriving home: Patient has a responsible party to drive her home after the procedure and to observe her for 24 hours.  Currently, due to Covid-19 pandemic, only one support person will be allowed with patient. Must be the same support person for that patient's entire stay, will be screened and required to wear a mask. They will be asked to wait in the waiting room for the duration of the patient's stay.  Patients are required to wear a mask when they enter the hospital.      COVID-19 Pre-Screening Questions:  . In the past 7 to 10 days have you had a cough,  shortness of breath, headache, congestion, fever (100 or greater) body aches, chills, sore throat, or sudden loss of taste or sense of smell? No  . Have you been around anyone with known Covid 19. No  . Have you been around anyone who is awaiting Covid 19 test results in the past 7 to 10 days? No  . Have you been around anyone who has been exposed to Covid 19, or has mentioned symptoms of Covid 19 within the past 7 to 10 days? No  If you have any concerns/questions about symptoms patients report during screening (either on the phone or at threshold). Contact the provider  seeing the patient or DOD for further guidance.  If neither are available contact a member of the leadership team.

## 2019-04-20 ENCOUNTER — Ambulatory Visit (HOSPITAL_COMMUNITY)
Admission: RE | Admit: 2019-04-20 | Discharge: 2019-04-20 | Disposition: A | Discharge status: Home / Self Care | Payer: Self-pay | Attending: Interventional Cardiology | Admitting: Interventional Cardiology

## 2019-04-20 ENCOUNTER — Other Ambulatory Visit: Payer: Self-pay

## 2019-04-20 ENCOUNTER — Encounter (HOSPITAL_COMMUNITY)
Admission: RE | Disposition: A | Discharge status: Home / Self Care | Payer: Self-pay | Source: Home / Self Care | Attending: Interventional Cardiology

## 2019-04-20 DIAGNOSIS — Z79899 Other long term (current) drug therapy: Secondary | ICD-10-CM | POA: Insufficient documentation

## 2019-04-20 DIAGNOSIS — I2699 Other pulmonary embolism without acute cor pulmonale: Secondary | ICD-10-CM | POA: Diagnosis present

## 2019-04-20 DIAGNOSIS — Z86718 Personal history of other venous thrombosis and embolism: Secondary | ICD-10-CM | POA: Insufficient documentation

## 2019-04-20 DIAGNOSIS — I2510 Atherosclerotic heart disease of native coronary artery without angina pectoris: Secondary | ICD-10-CM

## 2019-04-20 DIAGNOSIS — Z8249 Family history of ischemic heart disease and other diseases of the circulatory system: Secondary | ICD-10-CM | POA: Insufficient documentation

## 2019-04-20 DIAGNOSIS — R0789 Other chest pain: Secondary | ICD-10-CM | POA: Insufficient documentation

## 2019-04-20 DIAGNOSIS — Z791 Long term (current) use of non-steroidal anti-inflammatories (NSAID): Secondary | ICD-10-CM | POA: Insufficient documentation

## 2019-04-20 DIAGNOSIS — Z86711 Personal history of pulmonary embolism: Secondary | ICD-10-CM | POA: Diagnosis present

## 2019-04-20 DIAGNOSIS — R931 Abnormal findings on diagnostic imaging of heart and coronary circulation: Secondary | ICD-10-CM

## 2019-04-20 DIAGNOSIS — Z882 Allergy status to sulfonamides status: Secondary | ICD-10-CM | POA: Insufficient documentation

## 2019-04-20 DIAGNOSIS — Z7901 Long term (current) use of anticoagulants: Secondary | ICD-10-CM | POA: Insufficient documentation

## 2019-04-20 HISTORY — PX: LEFT HEART CATH AND CORONARY ANGIOGRAPHY: CATH118249

## 2019-04-20 SURGERY — LEFT HEART CATH AND CORONARY ANGIOGRAPHY
Anesthesia: LOCAL

## 2019-04-20 MED ORDER — OXYCODONE HCL 5 MG PO TABS: 5.0000 mg | ORAL_TABLET | ORAL | Status: DC | PRN

## 2019-04-20 MED ORDER — ACETAMINOPHEN 325 MG PO TABS: 650.0000 mg | ORAL_TABLET | ORAL | Status: DC | PRN

## 2019-04-20 MED ORDER — HYDRALAZINE HCL 20 MG/ML IJ SOLN: 10.0000 mg | INTRAMUSCULAR | Status: DC | PRN

## 2019-04-20 MED ORDER — IOHEXOL 350 MG/ML SOLN
INTRAVENOUS | Status: DC | PRN
  Administered 2019-04-20: 100 mL via INTRACARDIAC

## 2019-04-20 MED ORDER — ASPIRIN 81 MG PO CHEW: 81.0000 mg | CHEWABLE_TABLET | ORAL | Status: AC

## 2019-04-20 MED ORDER — FENTANYL CITRATE (PF) 100 MCG/2ML IJ SOLN
INTRAMUSCULAR | Status: DC | PRN
  Administered 2019-04-20: 25 ug via INTRAVENOUS

## 2019-04-20 MED ORDER — HEPARIN SODIUM (PORCINE) 1000 UNIT/ML IJ SOLN
INTRAMUSCULAR | Status: DC | PRN
  Administered 2019-04-20: 4500 [IU] via INTRAVENOUS

## 2019-04-20 MED ORDER — VERAPAMIL HCL 2.5 MG/ML IV SOLN
INTRAVENOUS | Status: AC
  Filled 2019-04-20: qty 2

## 2019-04-20 MED ORDER — SODIUM CHLORIDE 0.9 % WEIGHT BASED INFUSION
3.0000 mL/kg/h | INTRAVENOUS | Status: AC
  Administered 2019-04-20: 07:00:00 3 mL/kg/h via INTRAVENOUS

## 2019-04-20 MED ORDER — SODIUM CHLORIDE 0.9% FLUSH: 3.0000 mL | INTRAVENOUS | Status: DC | PRN

## 2019-04-20 MED ORDER — LIDOCAINE HCL (PF) 1 % IJ SOLN
INTRAMUSCULAR | Status: DC | PRN
  Administered 2019-04-20: 2 mL via INTRADERMAL

## 2019-04-20 MED ORDER — SODIUM CHLORIDE 0.9 % IV SOLN: INTRAVENOUS | Status: DC

## 2019-04-20 MED ORDER — HEPARIN SODIUM (PORCINE) 1000 UNIT/ML IJ SOLN
INTRAMUSCULAR | Status: AC
  Filled 2019-04-20: qty 1

## 2019-04-20 MED ORDER — ONDANSETRON HCL 4 MG/2ML IJ SOLN: 4.0000 mg | Freq: Four times a day (QID) | INTRAMUSCULAR | Status: DC | PRN

## 2019-04-20 MED ORDER — HEPARIN (PORCINE) IN NACL 1000-0.9 UT/500ML-% IV SOLN
INTRAVENOUS | Status: DC | PRN
  Administered 2019-04-20 (×3): 500 mL

## 2019-04-20 MED ORDER — LIDOCAINE HCL (PF) 1 % IJ SOLN
INTRAMUSCULAR | Status: AC
  Filled 2019-04-20: qty 30

## 2019-04-20 MED ORDER — HEPARIN (PORCINE) IN NACL 1000-0.9 UT/500ML-% IV SOLN
INTRAVENOUS | Status: AC
  Filled 2019-04-20: qty 1000

## 2019-04-20 MED ORDER — HEPARIN (PORCINE) IN NACL 1000-0.9 UT/500ML-% IV SOLN
INTRAVENOUS | Status: AC
  Filled 2019-04-20: qty 500

## 2019-04-20 MED ORDER — LABETALOL HCL 5 MG/ML IV SOLN: 10.0000 mg | INTRAVENOUS | Status: DC | PRN

## 2019-04-20 MED ORDER — MIDAZOLAM HCL 2 MG/2ML IJ SOLN
INTRAMUSCULAR | Status: DC | PRN
  Administered 2019-04-20: 1 mg via INTRAVENOUS

## 2019-04-20 MED ORDER — FENTANYL CITRATE (PF) 100 MCG/2ML IJ SOLN
INTRAMUSCULAR | Status: AC
  Filled 2019-04-20: qty 2

## 2019-04-20 MED ORDER — VERAPAMIL HCL 2.5 MG/ML IV SOLN
INTRAVENOUS | Status: DC | PRN
  Administered 2019-04-20: 10 mL via INTRA_ARTERIAL

## 2019-04-20 MED ORDER — SODIUM CHLORIDE 0.9 % IV SOLN: 250.0000 mL | INTRAVENOUS | Status: DC | PRN

## 2019-04-20 MED ORDER — SODIUM CHLORIDE 0.9 % WEIGHT BASED INFUSION: 1.0000 mL/kg/h | INTRAVENOUS | Status: DC

## 2019-04-20 MED ORDER — MIDAZOLAM HCL 2 MG/2ML IJ SOLN
INTRAMUSCULAR | Status: AC
  Filled 2019-04-20: qty 2

## 2019-04-20 MED ORDER — SODIUM CHLORIDE 0.9% FLUSH: 3.0000 mL | Freq: Two times a day (BID) | INTRAVENOUS | Status: DC

## 2019-04-20 SURGICAL SUPPLY — 10 items
CATH 5FR JL3.5 JR4 ANG PIG MP (CATHETERS) ×1 IMPLANT
DEVICE RAD TR BAND REGULAR (VASCULAR PRODUCTS) ×1 IMPLANT
GLIDESHEATH SLEND A-KIT 6F 22G (SHEATH) ×1 IMPLANT
GUIDEWIRE INQWIRE 1.5J.035X260 (WIRE) IMPLANT
INQWIRE 1.5J .035X260CM (WIRE) ×2
KIT HEART LEFT (KITS) ×2 IMPLANT
PACK CARDIAC CATHETERIZATION (CUSTOM PROCEDURE TRAY) ×2 IMPLANT
SHEATH PROBE COVER 6X72 (BAG) ×1 IMPLANT
TRANSDUCER W/STOPCOCK (MISCELLANEOUS) ×2 IMPLANT
TUBING CIL FLEX 10 FLL-RA (TUBING) ×2 IMPLANT

## 2019-04-20 NOTE — Interval H&P Note (Signed)
Cath Lab Visit (complete for each Cath Lab visit)  Clinical Evaluation Leading to the Procedure:   ACS: No.  Non-ACS:    Anginal Classification: CCS III  Anti-ischemic medical therapy: Minimal Therapy (1 class of medications)  Non-Invasive Test Results: High-risk stress test findings: cardiac mortality >3%/year  Prior CABG: No previous CABG      History and Physical Interval Note:  04/20/2019 7:10 AM  Allyson Sabal  has presented today for surgery, with the diagnosis of Abnormal cardiac CT.  The various methods of treatment have been discussed with the patient and family. After consideration of risks, benefits and other options for treatment, the patient has consented to  Procedure(s): LEFT HEART CATH AND CORONARY ANGIOGRAPHY (N/A) as a surgical intervention.  The patient's history has been reviewed, patient examined, no change in status, stable for surgery.  I have reviewed the patient's chart and labs.  Questions were answered to the patient's satisfaction.     Belva Crome III

## 2019-04-20 NOTE — Progress Notes (Addendum)
Ambulated to bathroom to void tol well  Discharge instructions reviewed with pt and her husband.

## 2019-04-20 NOTE — Discharge Instructions (Signed)
Drink plenty of fluids °Keep right arm at or above heart level  °Radial Site Care ° °This sheet gives you information about how to care for yourself after your procedure. Your health care provider may also give you more specific instructions. If you have problems or questions, contact your health care provider. °What can I expect after the procedure? °After the procedure, it is common to have: °· Bruising and tenderness at the catheter insertion area. °Follow these instructions at home: °Medicines °· Take over-the-counter and prescription medicines only as told by your health care provider. °Insertion site care °· Follow instructions from your health care provider about how to take care of your insertion site. Make sure you: °? Wash your hands with soap and water before you change your bandage (dressing). If soap and water are not available, use hand sanitizer. °? Change your dressing as told by your health care provider. °? Leave stitches (sutures), skin glue, or adhesive strips in place. These skin closures may need to stay in place for 2 weeks or longer. If adhesive strip edges start to loosen and curl up, you may trim the loose edges. Do not remove adhesive strips completely unless your health care provider tells you to do that. °· Check your insertion site every day for signs of infection. Check for: °? Redness, swelling, or pain. °? Fluid or blood. °? Pus or a bad smell. °? Warmth. °· Do not take baths, swim, or use a hot tub until your health care provider approves. °· You may shower 24-48 hours after the procedure, or as directed by your health care provider. °? Remove the dressing and gently wash the site with plain soap and water. °? Pat the area dry with a clean towel. °? Do not rub the site. That could cause bleeding. °· Do not apply powder or lotion to the site. °Activity ° °· For 24 hours after the procedure, or as directed by your health care provider: °? Do not flex or bend the affected arm. °? Do  not push or pull heavy objects with the affected arm. °? Do not drive yourself home from the hospital or clinic. You may drive 24 hours after the procedure unless your health care provider tells you not to. °? Do not operate machinery or power tools. °· Do not lift anything that is heavier than 10 lb (4.5 kg), or the limit that you are told, until your health care provider says that it is safe. °· Ask your health care provider when it is okay to: °? Return to work or school. °? Resume usual physical activities or sports. °? Resume sexual activity. °General instructions °· If the catheter site starts to bleed, raise your arm and put firm pressure on the site. If the bleeding does not stop, get help right away. This is a medical emergency. °· If you went home on the same day as your procedure, a responsible adult should be with you for the first 24 hours after you arrive home. °· Keep all follow-up visits as told by your health care provider. This is important. °Contact a health care provider if: °· You have a fever. °· You have redness, swelling, or yellow drainage around your insertion site. °Get help right away if: °· You have unusual pain at the radial site. °· The catheter insertion area swells very fast. °· The insertion area is bleeding, and the bleeding does not stop when you hold steady pressure on the area. °· Your arm or hand   becomes pale, cool, tingly, or numb. °These symptoms may represent a serious problem that is an emergency. Do not wait to see if the symptoms will go away. Get medical help right away. Call your local emergency services (911 in the [place] Do not drive yourself to the hospital. °Summary °· After the procedure, it is common to have bruising and tenderness at the site. °· Follow instructions from your health care provider about how to take care of your radial site wound. Check the wound every day for signs of infection. °· Do not lift anything that is heavier than 10 lb (4.5 kg), or the  limit that you are told, until your health care provider says that it is safe. °This information is not intended to replace advice given to you by your health care provider. Make sure you discuss any questions you have with your health care provider. °Document Released: 08/28/2010 Document Revised: 08/31/2017 Document Reviewed: 08/31/2017 °Elsevier Patient Education © 2020 [employer] ° °

## 2019-04-23 ENCOUNTER — Other Ambulatory Visit (HOSPITAL_COMMUNITY): Payer: Self-pay | Admitting: Emergency Medicine

## 2019-04-23 ENCOUNTER — Encounter (HOSPITAL_COMMUNITY): Payer: Self-pay | Admitting: Interventional Cardiology

## 2019-04-23 ENCOUNTER — Telehealth: Payer: Self-pay | Admitting: Cardiovascular Disease

## 2019-04-23 NOTE — Telephone Encounter (Signed)
She does not need to follow-up.  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

## 2019-04-23 NOTE — Telephone Encounter (Signed)
Please advise if she needs to keep her October appointment.

## 2019-04-23 NOTE — Telephone Encounter (Signed)
Would like to know if her appointment in Oct is still necessary

## 2019-04-24 NOTE — Telephone Encounter (Signed)
Patient notified.  She had multiple questions about contradictory results & her Lipitor.  She will keep her OV for October as scheduled.

## 2019-05-13 ENCOUNTER — Encounter (HOSPITAL_COMMUNITY): Payer: Self-pay

## 2019-05-21 ENCOUNTER — Ambulatory Visit: Payer: Self-pay | Admitting: Cardiovascular Disease

## 2019-05-30 ENCOUNTER — Other Ambulatory Visit: Payer: Self-pay

## 2019-05-30 ENCOUNTER — Encounter: Payer: Self-pay | Admitting: Neurology

## 2019-05-30 ENCOUNTER — Encounter

## 2019-05-30 ENCOUNTER — Ambulatory Visit: Payer: Self-pay | Admitting: Neurology

## 2019-05-30 VITALS — BP 126/91 | HR 84 | Temp 97.9°F | Ht 64.25 in | Wt 192.0 lb

## 2019-05-30 DIAGNOSIS — Z79899 Other long term (current) drug therapy: Secondary | ICD-10-CM

## 2019-05-30 DIAGNOSIS — I2694 Multiple subsegmental pulmonary emboli without acute cor pulmonale: Secondary | ICD-10-CM

## 2019-05-30 DIAGNOSIS — Z5320 Procedure and treatment not carried out because of patient's decision for unspecified reasons: Secondary | ICD-10-CM

## 2019-05-30 DIAGNOSIS — I82412 Acute embolism and thrombosis of left femoral vein: Secondary | ICD-10-CM

## 2019-05-30 DIAGNOSIS — Z780 Asymptomatic menopausal state: Secondary | ICD-10-CM

## 2019-05-30 MED ORDER — PHENOBARBITAL 64.8 MG PO TABS: 97.2000 mg | ORAL_TABLET | Freq: Every day | ORAL | 3 refills | Status: DC

## 2019-05-30 NOTE — Patient Instructions (Signed)
Coagulating Factor Concentration Test Why am I having this test? The coagulating factor concentration test is done to:  Try to find the cause of your blood clotting problems.  Monitor the effectiveness of therapy with certain blood thinners (anticoagulants). The clotting problems may be:  Present at birth.  The result of a disease.  The side effect of a medicine. What is being tested? This test measures the concentration of different coagulating proteins (factors) in your blood. These include factors II, V, VII, VIII, IX, X, XI, and XII. These factors are involved in a complicated series of events that allow your blood to clot normally. Abnormally low or high levels of these factors can affect the balance that normally keeps your blood from clotting too much or not enough. What kind of sample is taken?  A blood sample is required for this test. It is usually collected by inserting a needle into a blood vessel. How are the results reported? Your test results will be reported as values. Your health care provider will compare your results to normal ranges that were established after testing a large group of people (reference ranges). Reference ranges may vary among labs and hospitals. For this test, common reference ranges are: Factor / Normal Value (Percent of "Normal"):  II / 80-120.  V / 50-150.  VII / 65-140.  VIII / 55-145.  IX / 60-140.  X / 45-155.  XI / 65-135.  XII / 50-150. What do the results mean? Levels above the reference ranges for certain factors may indicate a condition that creates an inflammatory reaction, such as:  Sudden infection.  Severe injury (trauma).  Rheumatoid arthritis. Levels below the reference ranges for certain factors may indicate:  Liver disease.  Vitamin K deficiency.  A bleeding disorder that is passed from parent to child (inherited), such as hemophilia.  A condition in which the proteins that control blood clotting are  overactive, causing abnormal clotting processes to occur (disseminated intravascular coagulation, DIC). Talk with your health care provider about what your results mean. Questions to ask your health care provider Ask your health care provider, or the department that is doing the test:  When will my results be ready?  How will I get my results?  What are my treatment options?  What other tests do I need?  What are my next steps? Summary  The coagulating factor concentration test is done to help determine the cause of blood clotting problems. This test may also be used to monitor the effectiveness of anticoagulant therapy.  Coagulating factors in your blood include several proteins that interact together to allow your blood to clot normally.  Abnormally low or high levels of these factors can affect the balance that normally keeps your blood from clotting too much or not enough.  Talk with your health care provider about what your results mean. This information is not intended to replace advice given to you by your health care provider. Make sure you discuss any questions you have with your health care provider. Document Released: 08/26/2004 Document Revised: 04/18/2017 Document Reviewed: 04/18/2017 Elsevier Patient Education  2020 [employer] Inc[employer] Pulmonary Embolism  A pulmonary embolism (PE) is a sudden blockage or decrease of blood flow in one or both lungs. Most blockages come from a blood clot that forms in the vein of a lower leg, thigh, or arm (deep vein thrombosis, DVT) and travels to the lungs. A clot is blood that has thickened into a gel or solid. PE is  a dangerous and life-threatening condition that needs to be treated right away. What are the causes? This condition is usually caused by a blood clot that forms in a vein and moves to the lungs. In rare cases, it may be caused by air, fat, part of a tumor, or other tissue that moves through the veins and into the lungs. What  increases the risk? The following factors may make you more likely to develop this condition:  Experiencing a traumatic injury, such as breaking a hip or leg.  Having: ? A spinal cord injury. ? Orthopedic surgery, especially hip or knee replacement. ? Any major surgery. ? A stroke. ? DVT. ? Blood clots or blood clotting disease. ? Long-term (chronic) lung or heart disease. ? Cancer treated with chemotherapy. ? A central venous catheter.  Taking medicines that contain estrogen. These include birth control pills and hormone replacement therapy.  Being: ? Pregnant. ? In the period of time after your baby is delivered (postpartum). ? Older than age 33. ? Overweight. ? A smoker, especially if you have other risks. What are the signs or symptoms? Symptoms of this condition usually start suddenly and include:  Shortness of breath during activity or at rest.  Coughing, coughing up blood, or coughing up blood-tinged mucus.  Chest pain that is often worse with deep breaths.  Rapid or irregular heartbeat.  Feeling light-headed or dizzy.  Fainting.  Feeling anxious.  Fever.  Sweating.  Pain and swelling in a leg. This is a symptom of DVT, which can lead to PE. How is this diagnosed? This condition may be diagnosed based on:  Your medical history.  A physical exam.  Blood tests.  CT pulmonary angiogram. This test checks blood flow in and around your lungs.  Ventilation-perfusion scan, also called a lung VQ scan. This test measures air flow and blood flow to the lungs.  An ultrasound of the legs. How is this treated? Treatment for this condition depends on many factors, such as the cause of your PE, your risk for bleeding or developing more clots, and other medical conditions you have. Treatment aims to remove, dissolve, or stop blood clots from forming or growing larger. Treatment may include:  Medicines, such as: ? Blood thinning medicines (anticoagulants) to stop  clots from forming. ? Medicines that dissolve clots (thrombolytics).  Procedures, such as: ? Using a flexible tube to remove a blood clot (embolectomy) or to deliver medicine to destroy it (catheter-directed thrombolysis). ? Inserting a filter into a large vein that carries blood to the heart (inferior vena cava). This filter (vena cava filter) catches blood clots before they reach the lungs. ? Surgery to remove the clot (surgical embolectomy). This is rare. You may need a combination of immediate, long-term (up to 3 months after diagnosis), and extended (more than 3 months after diagnosis) treatments. Your treatment may continue for several months (maintenance therapy). You and your health care provider will work together to choose the treatment program that is best for you. Follow these instructions at home: Medicines  Take over-the-counter and prescription medicines only as told by your health care provider.  If you are taking an anticoagulant medicine: ? Take the medicine every day at the same time each day. ? Understand what foods and drugs interact with your medicine. ? Understand the side effects of this medicine, including excessive bruising or bleeding. Ask your health care provider or pharmacist about other side effects. General instructions  Wear a medical alert bracelet or carry a  medical alert card that says you have had a PE and lists what medicines you take.  Ask your health care provider when you may return to your normal activities. Avoid sitting or lying for a long time without moving.  Maintain a healthy weight. Ask your health care provider what weight is healthy for you.  Do not use any products that contain nicotine or tobacco, such as cigarettes, e-cigarettes, and chewing tobacco. If you need help quitting, ask your health care provider.  Talk with your health care provider about any travel plans. It is important to make sure that you are still able to take your  medicine while on trips.  Keep all follow-up visits as told by your health care provider. This is important. Contact a health care provider if:  You missed a dose of your blood thinner medicine. Get help right away if:  You have: ? New or increased pain, swelling, warmth, or redness in an arm or leg. ? Numbness or tingling in an arm or leg. ? Shortness of breath during activity or at rest. ? A fever. ? Chest pain. ? A rapid or irregular heartbeat. ? A severe headache. ? Vision changes. ? A serious fall or accident, or you hit your head. ? Stomach (abdominal) pain. ? Blood in your vomit, stool, or urine. ? A cut that will not stop bleeding.  You cough up blood.  You feel light-headed or dizzy.  You cannot move your arms or legs.  You are confused or have memory loss. These symptoms may represent a serious problem that is an emergency. Do not wait to see if the symptoms will go away. Get medical help right away. Call your local emergency services (911 in the [place] Do not drive yourself to the hospital. Summary  A pulmonary embolism (PE) is a sudden blockage or decrease of blood flow in one or both lungs. PE is a dangerous and life-threatening condition that needs to be treated right away.  Treatments for this condition usually include medicines to thin your blood (anticoagulants) or medicines to break apart blood clots (thrombolytics).  If you are given blood thinners, it is important to take the medicine every day at the same time each day.  Understand what foods and drugs interact with any medicines that you are taking.  If you have signs of PE or DVT, call your local emergency services (911 in the [place] This information is not intended to replace advice given to you by your health care provider. Make sure you discuss any questions you have with your health care provider. Document Released: 07/23/2000 Document Revised: 05/03/2018 Document Reviewed: 05/03/2018 Elsevier  Patient Education  2020 [employer].
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
## 2019-05-31 ENCOUNTER — Telehealth: Payer: Self-pay | Admitting: Cardiovascular Disease

## 2019-05-31 NOTE — Telephone Encounter (Signed)

## 2019-06-01 ENCOUNTER — Encounter: Payer: Self-pay | Admitting: Cardiovascular Disease

## 2019-06-01 ENCOUNTER — Ambulatory Visit (INDEPENDENT_AMBULATORY_CARE_PROVIDER_SITE_OTHER): Payer: Self-pay | Admitting: Cardiovascular Disease

## 2019-06-01 ENCOUNTER — Other Ambulatory Visit: Payer: Self-pay

## 2019-06-01 VITALS — BP 132/78 | HR 73 | Ht 64.25 in | Wt 192.0 lb

## 2019-06-01 DIAGNOSIS — F439 Reaction to severe stress, unspecified: Secondary | ICD-10-CM

## 2019-06-01 DIAGNOSIS — I2583 Coronary atherosclerosis due to lipid rich plaque: Secondary | ICD-10-CM

## 2019-06-01 DIAGNOSIS — I2510 Atherosclerotic heart disease of native coronary artery without angina pectoris: Secondary | ICD-10-CM

## 2019-06-01 DIAGNOSIS — I2699 Other pulmonary embolism without acute cor pulmonale: Secondary | ICD-10-CM

## 2019-06-01 NOTE — Patient Instructions (Signed)
Medication Instructions:  Continue all current medications.  Labwork:  HgA1c, Lipids - orders given today.   Reminder:  Nothing to eat or drink after 12 midnight prior to labs.  Office will contact with results via phone or letter.    Testing/Procedures: none  Follow-Up: 3 months   Any Other Special Instructions Will Be Listed Below (If Applicable).  If you need a refill on your cardiac medications before your next appointment, please call your pharmacy.

## 2019-06-01 NOTE — Progress Notes (Signed)
SUBJECTIVE: The patient presents for follow-up after undergoing cardiac catheterization on 04/20/2019.  This demonstrated mild to moderate LAD, circumflex, and right coronary calcification with overall nonobstructive disease.  The most significant lesion was a 40 to 50% eccentric proximal to mid left circumflex stenosis.  FFR of the circumflex lesion was borderline significant.  It was felt that the lesion should be followed clinically over time with PCI to be considered if symptoms are not controllable with medical therapy.  She has several questions regarding her cardiac catheterization findings.  She also has several questions regarding lifestyle modification including exercise.  She has a lot of stressors at home.  She would like to get back to the [facility] [employer].  She has been walking and using the elliptical 60 minutes at a time.  She wants to begin lifting weights again.    Social history: She is a Veterinary surgeon. She has been married since 6 83-10. Her husband is a Proofreader.  She has 2 daughters, Arlyss Repress and [name], one of whom has has postpartum depression and anxiety. The patient is originally from [place]. Her brother lives in [place].  She enjoys singing and musical theater.  Review of Systems: As per "subjective", otherwise negative.  Allergies  Allergen Reactions  . Tegretol [Carbamazepine] Hives  . Keppra [Levetiracetam]     Feels outside her body.[name]  . Sulfa Antibiotics Hives    Current Outpatient Medications  Medication Sig Dispense Refill  . atorvastatin (LIPITOR) 80 MG tablet Take 1 tablet (80 mg total) by mouth daily. 90 tablet 3  . PHENobarbital (LUMINAL) 64.8 MG tablet Take 1.5 tablets (97.2 mg total) by mouth at bedtime. 140 tablet 3  . XARELTO 20 MG TABS tablet Take 20 mg by mouth daily with supper.      Current Facility-Administered Medications  Medication Dose Route Frequency Provider Last Rate Last Dose  . sodium chloride flush (NS) 0.9 % injection 3  mL  3 mL Intravenous Q12H Laqueta Linden, MD        Past Medical History:  Diagnosis Date  . DVT (deep vein thrombosis) in pregnancy   . Gallstones   . Pulmonary embolism (HCC)   . Seizures (HCC)     Past Surgical History:  Procedure Laterality Date  . CHOLECYSTECTOMY N/A 07/02/2013   Procedure: LAPAROSCOPIC CHOLECYSTECTOMY WITH INTRAOPERATIVE CHOLANGIOGRAM;  Surgeon: Ernestene Mention, MD;  Location: [facility];  Service: General;  Laterality: N/A;  . LEFT HEART CATH AND CORONARY ANGIOGRAPHY N/A 04/20/2019   Procedure: LEFT HEART CATH AND CORONARY ANGIOGRAPHY;  Surgeon: Lyn Records, MD;  Location: [facility];  Service: Cardiovascular;  Laterality: N/A;    Social History   Socioeconomic History  . Marital status: Married    Spouse name: [name]  . Number of children: 2  . Years of education: College  . Highest education level: Not on file  Occupational History  . Occupation: Musician: [employer]  Social Needs  . Financial resource strain: Not on file  . Food insecurity    Worry: Not on file    Inability: Not on file  . Transportation needs    Medical: Not on file    Non-medical: Not on file  Tobacco Use  . Smoking status: Never Smoker  . Smokeless tobacco: Never Used  Substance and Sexual Activity  . Alcohol use: No  . Drug use: No  . Sexual activity: Yes    Birth control/protection: Post-menopausal  Lifestyle  .  Physical activity    Days per week: Not on file    Minutes per session: Not on file  . Stress: Not on file  Relationships  . Social Herbalist on phone: Not on file    Gets together: Not on file    Attends religious service: Not on file    Active member of club or organization: Not on file    Attends meetings of clubs or organizations: Not on file    Relationship status: Not on file  . Intimate partner violence    Fear of current or ex partner: Not on file    Emotionally abused: Not on file    Physically  abused: Not on file    Forced sexual activity: Not on file  Other Topics Concern  . Not on file  Social History Narrative   Patient is married ([name]) and lives at home with his husband.   Patient has two adult children.   Patient is working full-time.   Patient has a college education.   Patient is right-handed.   Patient drinks two cups of coffee and some soda.     Vitals:   06/01/19 1303  BP: 132/78  Pulse: 73  SpO2: 97%  Weight: 192 lb (87.1 kg)  Height: 5' 4.25" (1.632 m)    Wt Readings from Last 3 Encounters:  06/01/19 192 lb (87.1 kg)  05/30/19 192 lb (87.1 kg)  04/20/19 191 lb (86.6 kg)     PHYSICAL EXAM General: NAD HEENT: Normal. Neck: No JVD, no thyromegaly. Lungs: Clear to auscultation bilaterally with normal respiratory effort. CV: Regular rate and rhythm, normal S1/S2, no S3/S4, no murmur. No pretibial or periankle edema.  No carotid bruit.   Abdomen: Soft, nontender, no distention.  Neurologic: Alert and oriented.  Psych: Normal affect. Skin: Normal. Musculoskeletal: No gross deformities.      Labs: Lab Results  Component Value Date/Time   K 4.8 04/17/2019 10:21 AM   BUN 19 04/17/2019 10:21 AM   BUN 19 06/17/2016 11:48 AM   CREATININE 1.00 04/17/2019 10:21 AM   ALT 20 06/17/2016 11:48 AM   HGB 13.1 04/17/2019 10:21 AM     Lipids: No results found for: LDLCALC, LDLDIRECT, CHOL, TRIG, HDL    Cardiac catheterization 04/20/2019:   Mild to moderate LAD, circumflex, and right coronary calcification.  25 to 30% proximal to mid LAD.  40 to 50% eccentric proximal to mid circumflex.  30% mid RCA.  Normal LV function.  LVEDP is normal.  RECOMMENDATIONS:   Optimal medical therapy for nonobstructive coronary atherosclerosis: Lifestyle modifications as needed for heart health, aggressive lipid-lowering, assessment of glycemic control, blood pressure control.  In context of clinical symptoms, intervention is not appropriate.  FFR in  circumflex is borderline significant.  This should be followed clinically over time with PCI to be considered if symptoms not controllable with medical therapy.      ASSESSMENT AND PLAN: 1.  Coronary artery disease: Cardiac catheterization on 04/20/2019 demonstrated mild to moderate LAD, circumflex, and right coronary calcification with overall nonobstructive disease.  The most significant lesion was a 40 to 50% eccentric proximal to mid left circumflex stenosis.  FFR of the circumflex lesion was borderline significant.  It was felt that the lesion should be followed clinically over time with PCI to be considered if symptoms are not controllable with medical therapy.   She denies anginal symptoms. Continue atorvastatin 80 mg.  I will check lipids and HbA1c.  Lifestyle modification  counseling was provided.  2. 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 She follows with hematology.  3.  Anxiety and stress: She has begun meeting with a counselor.   Disposition: Follow up 3 months  Time spent: 40 minutes, of which greater than 50% was spent reviewing symptoms, relevant blood tests and studies, and discussing management plan with the patient.    Prentice DockerSuresh , M.D., F.A.C.C.

## 2019-06-05 ENCOUNTER — Encounter: Payer: Self-pay | Admitting: *Deleted

## 2019-06-15 ENCOUNTER — Other Ambulatory Visit (HOSPITAL_COMMUNITY)
Admission: RE | Admit: 2019-06-15 | Discharge: 2019-06-15 | Disposition: A | Discharge status: Home / Self Care | Payer: Self-pay | Source: Ambulatory Visit | Attending: Cardiovascular Disease | Admitting: Cardiovascular Disease

## 2019-06-15 DIAGNOSIS — I2510 Atherosclerotic heart disease of native coronary artery without angina pectoris: Secondary | ICD-10-CM | POA: Insufficient documentation

## 2019-06-15 DIAGNOSIS — I2583 Coronary atherosclerosis due to lipid rich plaque: Secondary | ICD-10-CM | POA: Insufficient documentation

## 2019-06-15 LAB — LIPID PANEL
Cholesterol: 176 mg/dL (ref 0–200)
HDL: 61 mg/dL (ref >40)
LDL Cholesterol: 98 mg/dL (ref 0–99)
Total CHOL/HDL Ratio: 2.9 RATIO
Triglycerides: 85 mg/dL (ref <150)
VLDL: 17 mg/dL (ref 0–40)

## 2019-06-15 LAB — HEMOGLOBIN A1C
Hgb A1c MFr Bld: 5.8 % — ABNORMAL HIGH (ref 4.8–5.6)
Mean Plasma Glucose: 119.76 mg/dL

## 2019-06-19 ENCOUNTER — Other Ambulatory Visit: Payer: Self-pay | Admitting: Neurology

## 2019-06-19 ENCOUNTER — Telehealth: Payer: Self-pay | Admitting: *Deleted

## 2019-06-19 DIAGNOSIS — Z5320 Procedure and treatment not carried out because of patient's decision for unspecified reasons: Secondary | ICD-10-CM

## 2019-06-19 MED ORDER — ROSUVASTATIN CALCIUM 40 MG PO TABS: 40.0000 mg | ORAL_TABLET | Freq: Every day | ORAL | 6 refills | Status: DC

## 2019-06-19 NOTE — Telephone Encounter (Signed)
Notes recorded by Laurine Blazer, LPN on [id number]/7014 at 3:02 PM EST  Patient notified. Copy to pmd. Follow up scheduled for 09/05/2019 with Dr. Bronson Ing. She will finish current supply & then change to the Crestor.  ------   Notes recorded by Arnoldo Lenis, MD on 06/18/2019 at 3:37 PM EST  Cholesterlol not at goal, LDL ideally would be <70. Stop atorvastatin, start crestor 40mg  daily    J BrancH MD

## 2019-07-11 ENCOUNTER — Encounter (HOSPITAL_COMMUNITY): Payer: Self-pay

## 2019-07-24 ENCOUNTER — Other Ambulatory Visit: Payer: Self-pay | Admitting: *Deleted

## 2019-07-25 MED ORDER — ATORVASTATIN CALCIUM 80 MG PO TABS: 80.0000 mg | ORAL_TABLET | Freq: Every day | ORAL | 3 refills | Status: DC

## 2019-08-16 ENCOUNTER — Encounter (HOSPITAL_COMMUNITY): Payer: Self-pay

## 2019-08-16 ENCOUNTER — Encounter (HOSPITAL_COMMUNITY): Payer: Self-pay | Admitting: Emergency Medicine

## 2019-08-16 ENCOUNTER — Encounter (HOSPITAL_COMMUNITY): Payer: Self-pay | Admitting: *Deleted

## 2019-08-16 ENCOUNTER — Telehealth (HOSPITAL_COMMUNITY): Payer: Self-pay | Admitting: *Deleted

## 2019-08-16 ENCOUNTER — Emergency Department (HOSPITAL_COMMUNITY): Payer: Self-pay

## 2019-08-16 ENCOUNTER — Other Ambulatory Visit: Payer: Self-pay

## 2019-08-16 ENCOUNTER — Emergency Department (HOSPITAL_COMMUNITY)
Admission: EM | Admit: 2019-08-16 | Discharge: 2019-08-17 | Disposition: A | Discharge status: Home / Self Care | Payer: Self-pay | Attending: Emergency Medicine | Admitting: Emergency Medicine

## 2019-08-16 DIAGNOSIS — I2510 Atherosclerotic heart disease of native coronary artery without angina pectoris: Secondary | ICD-10-CM | POA: Insufficient documentation

## 2019-08-16 DIAGNOSIS — R002 Palpitations: Secondary | ICD-10-CM | POA: Insufficient documentation

## 2019-08-16 DIAGNOSIS — R079 Chest pain, unspecified: Secondary | ICD-10-CM

## 2019-08-16 DIAGNOSIS — R0789 Other chest pain: Secondary | ICD-10-CM | POA: Insufficient documentation

## 2019-08-16 DIAGNOSIS — Z79899 Other long term (current) drug therapy: Secondary | ICD-10-CM | POA: Insufficient documentation

## 2019-08-16 LAB — BASIC METABOLIC PANEL
Anion gap: 11 (ref 5–15)
BUN: 28 mg/dL — ABNORMAL HIGH (ref 6–20)
CO2: 25 mmol/L (ref 22–32)
Calcium: 9.3 mg/dL (ref 8.9–10.3)
Chloride: 106 mmol/L (ref 98–111)
Creatinine, Ser: 0.98 mg/dL (ref 0.44–1.00)
GFR calc Af Amer: >60 mL/min (ref >60)
GFR calc non Af Amer: >60 mL/min (ref >60)
Glucose, Bld: 95 mg/dL (ref 70–99)
Potassium: 3.9 mmol/L (ref 3.5–5.1)
Sodium: 142 mmol/L (ref 135–145)

## 2019-08-16 LAB — CBC
HCT: 41.7 % (ref 36.0–46.0)
Hemoglobin: 13.3 g/dL (ref 12.0–15.0)
MCH: 30.4 pg (ref 26.0–34.0)
MCHC: 31.9 g/dL (ref 30.0–36.0)
MCV: 95.4 fL (ref 80.0–100.0)
Platelets: 301 10*3/uL (ref 150–400)
RBC: 4.37 MIL/uL (ref 3.87–5.11)
RDW: 12.9 % (ref 11.5–15.5)
WBC: 10.3 10*3/uL (ref 4.0–10.5)
nRBC: 0 % (ref 0.0–0.2)

## 2019-08-16 LAB — D-DIMER, QUANTITATIVE: D-Dimer, Quant: 0.34 ug/mL-FEU (ref 0.00–0.50)

## 2019-08-16 LAB — TROPONIN I (HIGH SENSITIVITY)
Troponin I (High Sensitivity): <2 ng/L (ref <18)
Troponin I (High Sensitivity): <2 ng/L (ref <18)

## 2019-08-16 NOTE — ED Provider Notes (Signed)
Emergency Department Provider Note   I have reviewed the triage vital signs and the nursing notes.   HISTORY  Chief Complaint Chest Pain   HPI Sarah Chung is a 59 y.o. female with history of DVT and pulmonary embolus who presents the emerge department today with multiple symptoms.   She states that she woke up this morning and noticed that she had some central chest pressure after an episode of palpitations.  This worried her so she called cardiology who told her to monitor it for the next couple days.  She talked to her hematologist to suggest that should be valuated emergency room.  She also states that she had a fall about a week ago and said tingling in her bilateral hands and feet that comes and goes.  Sometimes he has tingling elsewhere as well.  No numbness, weakness, urinary symptoms or saddle anesthesia.  No pain in her back.  No fevers.  No other associated symptoms.  No other associated or modifying symptoms.    Past Medical History:  Diagnosis Date  . DVT (deep vein thrombosis) in pregnancy   . Gallstones   . Pulmonary embolism (HCC)   . Seizures [facility])     Patient Active Problem List   Diagnosis Date Noted  . CAD in native artery 04/20/2019  . Abnormal cardiac CT angiography   . Pulmonary embolism (HCC) 02/09/2019  . Partial symptomatic epilepsy with complex partial seizures, not intractable, without status epilepticus (HCC) 02/27/2018  . Numbness 02/14/2018  . 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 Menopause present, declines hormone replacement therapy 06/17/2016  . Seizures (HCC) 01/09/2014  . Medication monitoring encounter 01/09/2014  . Calculus of gallbladder with acute cholecystitis, without mention of obstruction 07/03/2013  . 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 Encounter for therapeutic drug monitoring 12/20/2012    Past Surgical History:  Procedure Laterality Date  .  CHOLECYSTECTOMY N/A 07/02/2013   Procedure: LAPAROSCOPIC CHOLECYSTECTOMY WITH INTRAOPERATIVE CHOLANGIOGRAM;  Surgeon: Ernestene Mention, MD;  Location: [facility] OR[facility];  Service: General;  Laterality: N/A;  . LEFT HEART CATH AND CORONARY ANGIOGRAPHY N/A 04/20/2019   Procedure: LEFT HEART CATH AND CORONARY ANGIOGRAPHY;  Surgeon: Lyn Records, MD;  Location: [facility] INVASIVE CV LAB;  Service: Cardiovascular;  Laterality: N/A;    Current Outpatient Rx  . Order #: [id number] Class: Normal  . Order #: [id number] Class: Print  . Order #: [id number] Class: Historical Med    Allergies Tegretol [carbamazepine], Keppra [levetiracetam], and Sulfa antibiotics  Family History  Problem Relation Age of Onset  . Heart attack Father   . Parkinson's disease Father   . Heart disease Brother     Social History Social History   Tobacco Use  . Smoking status: Never Smoker  . Smokeless tobacco: Never Used  Substance Use Topics  . Alcohol use: No  . Drug use: No    Review of Systems  All other systems negative except as documented in the HPI. All pertinent positives and negatives as reviewed in the HPI. ____________________________________________   PHYSICAL EXAM:  VITAL SIGNS: ED Triage Vitals [08/16/19 1730]  Enc Vitals Group     BP (!) 152/91     Pulse Rate 70     Resp 18     Temp 98.8 F (37.1 C)     Temp Source Oral     SpO2 98 %     Weight 195 lb (88.5 kg)     Height 5' 4.25" (  1.632 m)    Constitutional: Alert and oriented. Well appearing and in no acute distress. Eyes: Conjunctivae are normal. PERRL. EOMI. Head: Atraumatic. Nose: No congestion/rhinnorhea. Mouth/Throat: Mucous membranes are moist.  Oropharynx non-erythematous. Neck: No stridor.  No meningeal signs.   Cardiovascular: Normal rate, regular rhythm. Good peripheral circulation. Grossly normal heart sounds.   Respiratory: Normal respiratory effort.  No retractions. Lungs CTAB. Gastrointestinal: Soft and nontender. No distention.    Musculoskeletal: No lower extremity tenderness nor edema. No gross deformities of extremities. Neurologic:  Normal speech and language. No gross focal neurologic deficits are appreciated.  Symmetric and equal Achilles and patellar reflexes.  Symmetric sensation to light touch in bilateral lower extremities.  Symmetric dorsiflexion and plantarflexion strength in bilateral lower extremities.  Symmetric hip flexion bilateral lower extremities.  No saddle anesthesia.  Grip strength is normal and equal bilaterally. Sensation to light touch symmetric BUE. Skin:  Skin is warm, dry and intact. No rash noted.  ____________________________________________   LABS (all labs ordered are listed, but only abnormal results are displayed)  Labs Reviewed  BASIC METABOLIC PANEL - Abnormal; Notable for the following components:      Result Value   BUN 28 (*)    All other components within normal limits  CBC  D-DIMER, QUANTITATIVE (NOT AT [facility])  TROPONIN I (HIGH SENSITIVITY)  TROPONIN I (HIGH SENSITIVITY)   ____________________________________________  EKG   EKG Interpretation  Date/Time:  Thursday August 16 2019 17:23:33 EST Ventricular Rate:  74 PR Interval:  148 QRS Duration: 80 QT Interval:  392 QTC Calculation: 435 R Axis:   -2 Text Interpretation: Normal sinus rhythm Moderate voltage criteria for LVH, may be normal variant Borderline ECG Confirmed by Nat Christen [phone]) on 08/16/2019 10:23:47 PM       ____________________________________________  RADIOLOGY  DG Chest 2 View  Result Date: 08/16/2019 CLINICAL DATA:  Chest pain. EXAM: CHEST - 2 VIEW COMPARISON:  March 27, 2019 FINDINGS: The heart size and mediastinal contours are within normal limits. Both lungs are clear. The visualized skeletal structures are unremarkable. Radiopaque surgical clips are seen within the right upper quadrant. IMPRESSION: No active cardiopulmonary disease. Electronically Signed   By: Virgina Norfolk M.D.    On: 08/16/2019 19:12    ____________________________________________   PROCEDURES  Procedure(s) performed:   Procedures   ____________________________________________   INITIAL IMPRESSION / ASSESSMENT AND PLAN / ED COURSE  Work-up as above.  Suspect patient might have a touch of anxiety.  Low suspicion for epidural hematoma which I think will be the biggest concern with the recent fall and the paresthesias.  The paresthesias are very intermittent and seem to be just worse today.  They are not consistent.  She has no weakness to go with them versus no other evidence of cauda equina.  As far as pulmonary embolus goes I think this is less likely.  She is not tachypneic she is not hypoxic she is not tachycardic her D-dimer is negative.  And she is on appropriate anticoagulant.  No indication for imaging at this time.  Could be paroxysmal atrial fibrillation however she is on anticoagulants already.  We will have her follow-up with cardiology as previously planned.  Pertinent labs & imaging results that were available during my care of the patient were reviewed by me and considered in my medical decision making (see chart for details).  A medical screening exam was performed and I feel the patient has had an appropriate workup for their chief complaint at this  time and likelihood of emergent condition existing is low. They have been counseled on decision, discharge, follow up and which symptoms necessitate immediate return to the emergency department. They or their family verbally stated understanding and agreement with plan and discharged in stable condition.   ____________________________________________  FINAL CLINICAL IMPRESSION(S) / ED DIAGNOSES  Final diagnoses:  Chest pain, unspecified type     MEDICATIONS GIVEN DURING THIS VISIT:  Medications - No data to display   NEW OUTPATIENT MEDICATIONS STARTED DURING THIS VISIT:  New Prescriptions   No medications on file    Note:   This note was prepared with assistance of Dragon voice recognition software. Occasional wrong-word or sound-a-like substitutions may have occurred due to the inherent limitations of voice recognition software.   Anvitha Hutmacher, Barbara Cower, MD 08/17/19 [phone]

## 2019-08-16 NOTE — Telephone Encounter (Signed)
Patient called clinic in response to the same message she sent earlier today. She is reporting this pain/pressure in her upper right chest. It is intermittent pain that she can not relate to activities or rest.  She is concerned because it has been happening all day.  She denies shortness of breath or any other chest pains/pressure.  Message sent to the nurse practitioners.

## 2019-08-16 NOTE — ED Triage Notes (Addendum)
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 Pt reports has been on a blood thinner ever since.   Pt reports fell on dec 30 and hit right ankle and right side of body. Pt also reports that this am started having chest pressure, intermittent left hand numbness and right arm numbness/tingling. Pt denies shortness of breath, chest pain at this time. nad noted.

## 2019-08-16 NOTE — ED Provider Notes (Signed)
2225: Medical screening exam.  Patient reports chest pressure since this a.m. with associated right arm numbness and tingling.  No dyspnea, diaphoresis, nausea.  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  He has been on Eliquis.  Status post fall on 12/30 with pain in right ankle and right side of body.  Basic cardiac work-up initiated including D-dimer.   Donnetta Hutching, MD 08/16/19 2228

## 2019-08-16 NOTE — Progress Notes (Signed)
Per Dr. Ellin Saba, given patient's history, she is to report to the ER for evaluation.  I have spoke with patient via telephone and advised.  She verbalizes understanding.    I spoke with Victorino Dike, RN in triage for report.

## 2019-08-17 ENCOUNTER — Encounter: Payer: Self-pay | Admitting: Cardiovascular Disease

## 2019-08-17 ENCOUNTER — Ambulatory Visit (INDEPENDENT_AMBULATORY_CARE_PROVIDER_SITE_OTHER): Payer: Self-pay | Admitting: Cardiovascular Disease

## 2019-08-17 ENCOUNTER — Telehealth: Payer: Self-pay | Admitting: Cardiovascular Disease

## 2019-08-17 VITALS — HR 70 | Ht 64.25 in | Wt 195.0 lb

## 2019-08-17 DIAGNOSIS — F439 Reaction to severe stress, unspecified: Secondary | ICD-10-CM

## 2019-08-17 DIAGNOSIS — I2782 Chronic pulmonary embolism: Secondary | ICD-10-CM

## 2019-08-17 DIAGNOSIS — K219 Gastro-esophageal reflux disease without esophagitis: Secondary | ICD-10-CM

## 2019-08-17 DIAGNOSIS — R0789 Other chest pain: Secondary | ICD-10-CM

## 2019-08-17 DIAGNOSIS — I2510 Atherosclerotic heart disease of native coronary artery without angina pectoris: Secondary | ICD-10-CM

## 2019-08-17 DIAGNOSIS — I2583 Coronary atherosclerosis due to lipid rich plaque: Secondary | ICD-10-CM

## 2019-08-17 DIAGNOSIS — R799 Abnormal finding of blood chemistry, unspecified: Secondary | ICD-10-CM

## 2019-08-17 NOTE — Progress Notes (Signed)
Virtual Visit via Telephone/Video Note   This visit type was conducted due to national recommendations for restrictions regarding the COVID-19 Pandemic (e.g. social distancing) in an effort to limit this patient's exposure and mitigate transmission in our community.  Due to her co-morbid illnesses, this patient is at least at moderate risk for complications without adequate follow up.  This format is felt to be most appropriate for this patient at this time. All issues noted in this document were discussed and addressed.  No physical exam could be performed with this format.  Please refer to the patient's chart for her  consent to telehealth for [facility].   Date:  08/17/2019   ID:  Sarah Chung, DOB 28-Oct-1960, MRN [id number]  Patient Location: Home Provider Location: Home  PCP:  Shawnie Dapper, PA-C  Cardiologist:  Prentice Docker, MD  Electrophysiologist:  None   Evaluation Performed:  Follow-Up Visit  Chief Complaint: Chest pain  History of Present Illness:    Sarah Chung is a 59 y.o. female with nonobstructive coronary artery disease.  She underwent cardiac catheterization on 04/20/2019. This demonstrated mild to moderate LAD, circumflex, and right coronary calcification with overall nonobstructive disease.  The most significant lesion was a 40 to 50% eccentric proximal to mid left circumflex stenosis.  FFR of the circumflex lesion was borderline significant.  It was felt that the lesion should be followed clinically over time with PCI to be considered if symptoms are not controllable with medical therapy.  She has a lot of ongoing family stressors.  She contacted me yesterday through my CMA regarding symptoms of hand tingling and numbness and feet tingling and numbness.  She also had right upper sided chest pains.  She then spoke with her hematologist who instructed her to go to the ED yesterday evening.  Ultimately it was felt by the ED physician that symptoms may have  been precipitated by anxiety.  Blood pressure is elevated to 152/91 with a heart rate of 70 at the time of presentation.  Chest x-ray showed no active cardiopulmonary disease.  High-sensitivity troponins, CBC, renal function, and D-dimer were normal.  I personally reviewed the ECG which demonstrated sinus rhythm with borderline LVH.  She's been having some heartburn symptoms which are retrosternally located.  She had questions about elevated BUN, chest xray, and ECG findings.  She drinks 2 cups of water a day and 2-3 coffee daily. She occasionally drinks Coke Zero.  She denies constipation and diarrhea.  She describes a fleeting sensation in the center of her chest which "feels like a swelling" but she does not notice any actual swelling per se.   Past Medical History:  Diagnosis Date  . DVT (deep vein thrombosis) in pregnancy   . Gallstones   . Pulmonary embolism (HCC)   . Seizures (HCC)    Past Surgical History:  Procedure Laterality Date  . CHOLECYSTECTOMY N/A 07/02/2013   Procedure: LAPAROSCOPIC CHOLECYSTECTOMY WITH INTRAOPERATIVE CHOLANGIOGRAM;  Surgeon: Ernestene Mention, MD;  Location: [facility];  Service: General;  Laterality: N/A;  . LEFT HEART CATH AND CORONARY ANGIOGRAPHY N/A 04/20/2019   Procedure: LEFT HEART CATH AND CORONARY ANGIOGRAPHY;  Surgeon: Lyn Records, MD;  Location[facility];  Service: Cardiovascular;  Laterality: N/A;     Current Meds  Medication Sig  . atorvastatin (LIPITOR) 80 MG tablet Take 1 tablet (80 mg total) by mouth daily.  Marland Kitchen PHENobarbital (LUMINAL) 64.8 MG tablet Take 1.5 tablets (97.2 mg total) by  mouth at bedtime.  Alveda Reasons 20 MG TABS tablet Take 20 mg by mouth daily with supper.    Current Facility-Administered Medications for the 08/17/19 encounter (Office Visit) with Herminio Commons, MD  Medication  . sodium chloride flush (NS) 0.9 % injection 3 mL     Allergies:   Tegretol [carbamazepine], Keppra [levetiracetam], and  Sulfa antibiotics   Social History   Tobacco Use  . Smoking status: Never Smoker  . Smokeless tobacco: Never Used  Substance Use Topics  . Alcohol use: No  . Drug use: No     Family Hx: The patient's family history includes Heart attack in her father; Heart disease in her brother; Parkinson's disease in her father.  ROS:   Please see the history of present illness.     All other systems reviewed and are negative.   Prior CV studies:   The following studies were reviewed today:  Cardiac catheterization 04/20/2019:   Mild to moderate LAD, circumflex, and right coronary calcification.  25 to 30% proximal to mid LAD.  40 to 50% eccentric proximal to mid circumflex.  30% mid RCA.  Normal LV function. LVEDP is normal.  RECOMMENDATIONS:   Optimal medical therapy for nonobstructive coronary atherosclerosis: Lifestyle modifications as needed for heart health, aggressive lipid-lowering, assessment of glycemic control, blood pressure control.  In context of clinical symptoms, intervention is not appropriate. FFR in circumflex is borderline significant. This should be followed clinically over time with PCI to be considered if symptoms not controllable with medical therapy.    Echocardiogram 02/09/2019:   1. The left ventricle has normal systolic function with an ejection fraction of 60-65%. The cavity size was normal. There is mildly increased left ventricular wall thickness. Left ventricular diastolic Doppler parameters are consistent with impaired  relaxation. Indeterminate filling pressures The E/e' is 8-15. No evidence of left ventricular regional wall motion abnormalities.  2. The mitral valve is grossly normal.  3. The tricuspid valve is grossly normal.  4. The aortic valve is tricuspid. Aortic valve regurgitation is trivial by color flow Doppler. No stenosis of the aortic valve.  5. The inferior vena cava was dilated in size with <50% respiratory  variability.  Labs/Other Tests and Data Reviewed:    EKG:  No ECG reviewed.  Recent Labs: 03/26/2019: B Natriuretic Peptide 14.8 08/16/2019: BUN 28; Creatinine, Ser 0.98; Hemoglobin 13.3; Platelets 301; Potassium 3.9; Sodium 142   Recent Lipid Panel Lab Results  Component Value Date/Time   CHOL 176 06/15/2019 08:45 AM   TRIG 85 06/15/2019 08:45 AM   HDL 61 06/15/2019 08:45 AM   CHOLHDL 2.9 06/15/2019 08:45 AM   LDLCALC 98 06/15/2019 08:45 AM    Wt Readings from Last 3 Encounters:  08/17/19 195 lb (88.5 kg)  08/16/19 195 lb (88.5 kg)  06/01/19 192 lb (87.1 kg)     Objective:    Vital Signs:  Pulse 70   Ht 5' 4.25" (1.632 m)   Wt 195 lb (88.5 kg)   BMI 33.21 kg/m    VITAL SIGNS:  reviewed  ASSESSMENT & PLAN:    1.  Coronary artery disease: Cardiac catheterization on 04/20/2019 demonstrated mild to moderate LAD, circumflex, and right coronary calcification with overall nonobstructive disease.  The most significant lesion was a 40 to 50% eccentric proximal to mid left circumflex stenosis.  FFR of the circumflex lesion was borderline significant.  It was felt that the lesion should be followed clinically over time with PCI to be considered if  symptoms are not controllable with medical therapy.   Continue atorvastatin 80 mg.   2. 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 She follows with hematology.  3.  Anxiety and stress: She meets periodically with a therapist.  4. Elevated BUN: I spoke to her about the importance of decreasing caffeine consumption and increasing water intake.  She agrees to do so.  5.  GERD/chest discomfort: It appears she has GERD-like symptoms and possible some degree of esophageal spasm.  I encouraged her to decrease caffeine consumption and increase fluid intake.  I also encouraged her to try over-the-counter Tums.  If this does not help alleviate symptoms in about 6 weeks, I would consider prescribing omeprazole.  She does have a lot of  home stressors which are likely contributing to increased GERD symptoms.  COVID-19 Education: The signs and symptoms of COVID-19 were discussed with the patient and how to seek care for testing (follow up with PCP or arrange E-visit).  The importance of social distancing was discussed today.  Time:   Today, I have spent 40 minutes with the patient with telehealth technology discussing the above problems.     Medication Adjustments/Labs and Tests Ordered: Current medicines are reviewed at length with the patient today.  Concerns regarding medicines are outlined above.   Tests Ordered: No orders of the defined types were placed in this encounter.   Medication Changes: No orders of the defined types were placed in this encounter.   Follow Up:  Virtual Visit  in 6 week(s)  Signed, Prentice Docker, MD  08/17/2019 11:49 AM    [facility]

## 2019-08-17 NOTE — Patient Instructions (Signed)
Your physician recommends that you schedule a follow-up appointment in: 6 WEEKS WITH DR [facility]  Your physician recommends that you continue on your current medications as directed. Please refer to the Current Medication list given to you today.  Thank you for choosing [facility]!!

## 2019-08-20 NOTE — Telephone Encounter (Signed)
ERROR

## 2019-08-30 ENCOUNTER — Ambulatory Visit: Payer: Self-pay | Attending: Internal Medicine

## 2019-08-30 DIAGNOSIS — Z23 Encounter for immunization: Secondary | ICD-10-CM | POA: Insufficient documentation

## 2019-08-30 NOTE — Progress Notes (Signed)
   Covid-19 Vaccination Clinic  Name:  Sarah Chung    MRN: [id number] DOB: 1960-09-05  08/30/2019  Sarah Chung was observed post Covid-19 immunization for 15 minutes without incidence. She was provided with Vaccine Information Sheet and instruction to access the V-Safe system.   Sarah Chung was instructed to call 911 with any severe reactions post vaccine: Marland Kitchen Difficulty breathing  . Swelling of your face and throat  . A fast heartbeat  . A bad rash all over your body  . Dizziness and weakness    Immunizations Administered    Name Date Dose VIS Date Route   Pfizer COVID-19 Vaccine 08/30/2019  6:15 PM 0.3 mL 07/20/2019 Intramuscular   Manufacturer: [employer], [employer]   Lot: [id number]   NDC: [address]-5

## 2019-09-05 ENCOUNTER — Ambulatory Visit: Payer: Self-pay | Admitting: Cardiovascular Disease

## 2019-09-13 ENCOUNTER — Ambulatory Visit: Payer: Self-pay

## 2019-09-20 ENCOUNTER — Ambulatory Visit: Payer: Self-pay | Attending: Internal Medicine

## 2019-09-20 DIAGNOSIS — Z23 Encounter for immunization: Secondary | ICD-10-CM | POA: Insufficient documentation

## 2019-09-20 NOTE — Progress Notes (Signed)
   Covid-19 Vaccination Clinic  Name:  JODEEN MCLIN    MRN: [id number] DOB: [birth year]  09/20/2019  Ms. Wiginton was observed post Covid-19 immunization for 15 minutes without incidence. She was provided with Vaccine Information Sheet and instruction to access the V-Safe system.   Ms. Fairclough was instructed to call 911 with any severe reactions post vaccine: Marland Kitchen Difficulty breathing  . Swelling of your face and throat  . A fast heartbeat  . A bad rash all over your body  . Dizziness and weakness    Immunizations Administered    Name Date Dose VIS Date Route   Pfizer COVID-19 Vaccine 09/20/2019  4:19 PM 0.3 mL 07/20/2019 Intramuscular   Manufacturer: [employer], [employer]   Lot: [id number]   NDC: [address]-2

## 2019-09-26 ENCOUNTER — Telehealth: Payer: Self-pay | Admitting: Cardiovascular Disease

## 2019-10-05 ENCOUNTER — Ambulatory Visit: Payer: Self-pay

## 2019-12-31 ENCOUNTER — Encounter (HOSPITAL_COMMUNITY): Payer: Self-pay

## 2019-12-31 ENCOUNTER — Encounter (HOSPITAL_COMMUNITY): Payer: Self-pay | Admitting: *Deleted

## 2019-12-31 ENCOUNTER — Telehealth: Payer: Self-pay | Admitting: Emergency Medicine

## 2019-12-31 ENCOUNTER — Emergency Department (HOSPITAL_COMMUNITY)
Admission: EM | Admit: 2019-12-31 | Discharge: 2019-12-31 | Disposition: A | Discharge status: Home / Self Care | Payer: Self-pay | Attending: Emergency Medicine | Admitting: Emergency Medicine

## 2019-12-31 ENCOUNTER — Encounter: Payer: Self-pay | Admitting: Emergency Medicine

## 2019-12-31 ENCOUNTER — Ambulatory Visit (INDEPENDENT_AMBULATORY_CARE_PROVIDER_SITE_OTHER): Payer: Self-pay

## 2019-12-31 ENCOUNTER — Other Ambulatory Visit: Payer: Self-pay

## 2019-12-31 ENCOUNTER — Ambulatory Visit
Admission: EM | Admit: 2019-12-31 | Discharge: 2019-12-31 | Disposition: A | Discharge status: Home / Self Care | Payer: Self-pay | Attending: Emergency Medicine | Admitting: Emergency Medicine

## 2019-12-31 DIAGNOSIS — R0602 Shortness of breath: Secondary | ICD-10-CM

## 2019-12-31 DIAGNOSIS — Z86718 Personal history of other venous thrombosis and embolism: Secondary | ICD-10-CM

## 2019-12-31 DIAGNOSIS — R05 Cough: Secondary | ICD-10-CM

## 2019-12-31 DIAGNOSIS — R2241 Localized swelling, mass and lump, right lower limb: Secondary | ICD-10-CM | POA: Insufficient documentation

## 2019-12-31 DIAGNOSIS — R059 Cough, unspecified: Secondary | ICD-10-CM

## 2019-12-31 DIAGNOSIS — Z5321 Procedure and treatment not carried out due to patient leaving prior to being seen by health care provider: Secondary | ICD-10-CM | POA: Insufficient documentation

## 2019-12-31 DIAGNOSIS — R069 Unspecified abnormalities of breathing: Secondary | ICD-10-CM | POA: Insufficient documentation

## 2019-12-31 MED ORDER — PREDNISONE 10 MG (21) PO TBPK: ORAL_TABLET | Freq: Every day | ORAL | 0 refills | Status: DC

## 2019-12-31 MED ORDER — ALBUTEROL SULFATE HFA 108 (90 BASE) MCG/ACT IN AERS: 1.0000 | INHALATION_SPRAY | Freq: Four times a day (QID) | RESPIRATORY_TRACT | 0 refills | Status: DC | PRN

## 2019-12-31 NOTE — Discharge Instructions (Addendum)
Recommending further evaluation and management in the ED for PE rule out.  Patient aware and in agreement with plan.  Will go by private vehicle to [facility] ED.

## 2019-12-31 NOTE — ED Triage Notes (Signed)
Starting sneezing a lot this past Thursday, then started to have a cough.  The cough is better now but she is hearing some whistling and crackling in her lungs when breathing at times.  Also reports an episode of shortness of breath this morning while doing the laundry.  Hx of PE.

## 2019-12-31 NOTE — Telephone Encounter (Signed)
Patient called requesting for medication for bronchitis.  It was recommended that the patient go to the ED to rule out blood clot, given hx of PE and recent travel.  However, patient does not want to wait 2-3 hours at [facility] ED.  Would like treatment for possible bronchitis.  Prednisone and albuterol sent to pharmacy on file.    Patient is aware that we were unable to rule out blood clot in urgent care setting.  Offered patient further evaluation and management in the ED.  Patient declines at this time and would like to try outpatient therapy first.  Aware of the risk associated with this decision including missed diagnosis, organ damage, organ failure, and/or death.  Patient aware and in agreement.

## 2019-12-31 NOTE — ED Triage Notes (Signed)
Pt c/o coughing and hearing a high pitched noise when she breathes. Pt was seen at Urgent Care today and was told to come to ED to r/o PE due to pt's history of PE last July. Pt currently takes Xarelto and has been taking it consistently. Pt also reports the Urgent Care provider reports her right calf was more swollen than the other and when she touched it it was very tender. Pt also reports feeling "worn out" when she walks. Pt reports these symptoms are the same as last time she had a PE.

## 2019-12-31 NOTE — ED Provider Notes (Signed)
[facility] CARE CENTER   [id number] 12/31/19 Arrival Time: 1306  Cc: COUGH  SUBJECTIVE:  Sarah Chung is a 59 y.o. female who presents with sneezing, cough, and SOB x 4 days.  Grandson with strep, but otherwise denies sick contacts.  Describes cough as intermittent and mildly productive sputum.  Has tried OTC medications with minimal relief.  Symptoms are made worse with activity.  Reports previous symptoms in the past with PE.   Reports whistling and cracking in her lungs with breathing as well.  Denies fever, chills, fatigue, sinus pain, rhinorrhea, sore throat, chest pain, nausea, changes in bowel or bladder habits.    Hx significant for PE, and recent long travel to the beach.    Calf pain or swelling, recent surgery, hormone use, tobacco use, malignancy, pregnancy.  ROS: As per HPI.  All other pertinent ROS negative.     Past Medical History:  Diagnosis Date  . DVT (deep vein thrombosis) in pregnancy   . Gallstones   . Pulmonary embolism (HCC)   . Seizures (HCC)    Past Surgical History:  Procedure Laterality Date  . CHOLECYSTECTOMY N/A 07/02/2013   Procedure: LAPAROSCOPIC CHOLECYSTECTOMY WITH INTRAOPERATIVE CHOLANGIOGRAM;  Surgeon: Ernestene Mention, MD;  Location: [facility] ORS;  Service: General;  Laterality: N/A;  . LEFT HEART CATH AND CORONARY ANGIOGRAPHY N/A 04/20/2019   Procedure: LEFT HEART CATH AND CORONARY ANGIOGRAPHY;  Surgeon: Lyn Records, MD;  Location: [facility] INVASIVE CV LAB;  Service: Cardiovascular;  Laterality: N/A;   Allergies  Allergen Reactions  . Tegretol [Carbamazepine] Hives  . Keppra [Levetiracetam]     Feels outside her body.[name]  . Sulfa Antibiotics Hives   Current Facility-Administered Medications on File Prior to Encounter  Medication Dose Route Frequency Provider Last Rate Last Admin  . sodium chloride flush (NS) 0.9 % injection 3 mL  3 mL Intravenous Q12H Laqueta Linden, MD       Current Outpatient Medications on File Prior to Encounter    Medication Sig Dispense Refill  . atorvastatin (LIPITOR) 80 MG tablet Take 1 tablet (80 mg total) by mouth daily. 90 tablet 3  . PHENobarbital (LUMINAL) 64.8 MG tablet Take 1.5 tablets (97.2 mg total) by mouth at bedtime. 140 tablet 3  . XARELTO 20 MG TABS tablet Take 20 mg by mouth daily with supper.       Social History   Socioeconomic History  . Marital status: Married    Spouse name: [name]  . Number of children: 2  . Years of education: College  . Highest education level: Not on file  Occupational History  . Occupation: Musician: [employer]  Tobacco Use  . Smoking status: Never Smoker  . Smokeless tobacco: Never Used  Substance and Sexual Activity  . Alcohol use: No  . Drug use: No  . Sexual activity: Yes    Birth control/protection: Post-menopausal  Other Topics Concern  . Not on file  Social History Narrative   Patient is married ([name]) and lives at home with his husband.   Patient has two adult children.   Patient is working full-time.   Patient has a college education.   Patient is right-handed.   Patient drinks two cups of coffee and some soda.   Social Determinants of Health   Financial Resource Strain:   . Difficulty of Paying Living Expenses:   Food Insecurity:   . Worried About Programme researcher, broadcasting/film/video in the Last Year:   . Ran  Out of Food in the Last Year:   Transportation Needs:   . Lack of Transportation (Medical):   Marland Kitchen Lack of Transportation (Non-Medical):   Physical Activity:   . Days of Exercise per Week:   . Minutes of Exercise per Session:   Stress:   . Feeling of Stress :   Social Connections:   . Frequency of Communication with Friends and Family:   . Frequency of Social Gatherings with Friends and Family:   . Attends Religious Services:   . Active Member of Clubs or Organizations:   . Attends Archivist Meetings:   Marland Kitchen Marital Status:   Intimate Partner Violence:   . Fear of Current or Ex-Partner:   .  Emotionally Abused:   Marland Kitchen Physically Abused:   . Sexually Abused:    Family History  Problem Relation Age of Onset  . Heart attack Father   . Parkinson's disease Father   . Heart disease Brother      OBJECTIVE:  Vitals:   12/31/19 1319 12/31/19 1323  BP:  130/85  Pulse:  74  Resp:  17  Temp:  98.4 F (36.9 C)  TempSrc:  Oral  SpO2:  95%  Weight: 190 lb (86.2 kg)   Height: 5\' 4"  (1.626 m)      General appearance: Alert, appears fatigued, but nontoxic; speaking in full sentences without difficulty HEENT:NCAT; Ears: EACs clear, TMs pearly gray; Eyes: PERRL.  EOM grossly intact. Nose: nares patent without rhinorrhea; Throat: tonsils nonerythematous or enlarged, uvula midline  Neck: supple without LAD Lungs: wheezes and crackles throughout bilateral lungs; mild cough present Heart: regular rate and rhythm.  Skin: warm and dry Psychological: alert and cooperative; normal mood and affect  DIAGNOSTIC STUDIES:  DG Chest 2 View  Result Date: 12/31/2019 CLINICAL DATA:  Cough and shortness of breath. EXAM: CHEST - 2 VIEW COMPARISON:  Chest radiograph 08/16/2019 FINDINGS: The heart size and mediastinal contours are within normal limits. Minimal left basilar atelectasis/scarring, similar in appearance to prior studies. No new focal consolidation. No pneumothorax or pleural effusion. The visualized skeletal structures are unremarkable. IMPRESSION: No acute cardiopulmonary finding. Electronically Signed   By: Audie Pinto M.D.   On: 12/31/2019 13:35    X-rays negative for cardiopulmonary disease.    I have reviewed the x-rays myself and the radiologist interpretation. I am in agreement with the radiologist interpretation.     ASSESSMENT & PLAN:  1. Cough   2. Shortness of breath   3. Hx of blood clots     Orders Placed This Encounter  Procedures  . DG Chest 2 View    Standing Status:   Standing    Number of Occurrences:   1    Order Specific Question:   Reason for Exam  (SYMPTOM  OR DIAGNOSIS REQUIRED)    Answer:   worsening cough     Recommending further evaluation and management in the ED for PE rule out.  Patient aware and in agreement with plan.  Will go by private vehicle to [facility] ED.        Lestine Box, PA-C 12/31/19 1357

## 2019-12-31 NOTE — ED Notes (Signed)
Registration reported to triage RN that pt left due to wait times being so long.

## 2020-03-05 ENCOUNTER — Inpatient Hospital Stay (HOSPITAL_COMMUNITY): Payer: Self-pay | Attending: Hematology

## 2020-03-05 ENCOUNTER — Other Ambulatory Visit: Payer: Self-pay

## 2020-03-05 DIAGNOSIS — I2692 Saddle embolus of pulmonary artery without acute cor pulmonale: Secondary | ICD-10-CM | POA: Insufficient documentation

## 2020-03-05 LAB — D-DIMER, QUANTITATIVE: D-Dimer, Quant: 0.32 ug/mL-FEU (ref 0.00–0.50)

## 2020-03-12 ENCOUNTER — Inpatient Hospital Stay (HOSPITAL_COMMUNITY): Payer: Self-pay | Attending: Hematology | Admitting: Hematology

## 2020-03-12 VITALS — BP 144/88 | HR 69 | Temp 97.5°F | Resp 18 | Wt 197.9 lb

## 2020-03-12 DIAGNOSIS — Z7901 Long term (current) use of anticoagulants: Secondary | ICD-10-CM | POA: Insufficient documentation

## 2020-03-12 DIAGNOSIS — Z818 Family history of other mental and behavioral disorders: Secondary | ICD-10-CM | POA: Insufficient documentation

## 2020-03-12 DIAGNOSIS — Z79899 Other long term (current) drug therapy: Secondary | ICD-10-CM | POA: Insufficient documentation

## 2020-03-12 DIAGNOSIS — Z86718 Personal history of other venous thrombosis and embolism: Secondary | ICD-10-CM | POA: Insufficient documentation

## 2020-03-12 DIAGNOSIS — Z9049 Acquired absence of other specified parts of digestive tract: Secondary | ICD-10-CM | POA: Insufficient documentation

## 2020-03-12 DIAGNOSIS — Z8249 Family history of ischemic heart disease and other diseases of the circulatory system: Secondary | ICD-10-CM | POA: Insufficient documentation

## 2020-03-12 DIAGNOSIS — Z86711 Personal history of pulmonary embolism: Secondary | ICD-10-CM | POA: Insufficient documentation

## 2020-03-12 DIAGNOSIS — I2692 Saddle embolus of pulmonary artery without acute cor pulmonale: Secondary | ICD-10-CM

## 2020-03-12 DIAGNOSIS — Z882 Allergy status to sulfonamides status: Secondary | ICD-10-CM | POA: Insufficient documentation

## 2020-03-12 DIAGNOSIS — G40209 Localization-related (focal) (partial) symptomatic epilepsy and epileptic syndromes with complex partial seizures, not intractable, without status epilepticus: Secondary | ICD-10-CM | POA: Insufficient documentation

## 2020-03-12 MED ORDER — XARELTO 20 MG PO TABS: 20.0000 mg | ORAL_TABLET | Freq: Every day | ORAL | 12 refills | Status: DC

## 2020-03-12 NOTE — Progress Notes (Signed)
[facility] [address], [place] [address]   CLINIC:  Medical Oncology/Hematology  PCP:  Shawnie Dapper, PA-C [address] [place] [address]  [phone]  REASON FOR VISIT:  Follow-up for unprovoked DVT and PE  PRIOR THERAPY: None  CURRENT THERAPY: Xarelto  INTERVAL HISTORY:  [name], a 59 y.o. female, returns for routine follow-up for her unprovoked DVT and PE. Arra was last seen on 03/06/2019.  Today she reports that she is doing well. She is tolerating the Xarelto well and denies palpitations, hematochezia, hematuria, gum or nosebleeds.  She got her second Pfizer COVID vaccine on 09/14/2019.  REVIEW OF SYSTEMS:  Review of Systems  Constitutional: Negative for appetite change and fatigue.  HENT:   Negative for nosebleeds.   Respiratory: Positive for cough.   Cardiovascular: Negative for palpitations.  Gastrointestinal: Negative for blood in stool.  Genitourinary: Negative for hematuria.   Musculoskeletal: Positive for myalgias (leg cramps).  All other systems reviewed and are negative.   PAST MEDICAL/SURGICAL HISTORY:  Past Medical History:  Diagnosis Date  . DVT (deep vein thrombosis) in pregnancy   . Gallstones   . Pulmonary embolism (HCC)   . Seizures (HCC)    Past Surgical History:  Procedure Laterality Date  . CHOLECYSTECTOMY N/A 07/02/2013   Procedure: LAPAROSCOPIC CHOLECYSTECTOMY WITH INTRAOPERATIVE CHOLANGIOGRAM;  Surgeon: Ernestene Mention, MD;  Location: [facility] ORS;  Service: General;  Laterality: N/A;  . LEFT HEART CATH AND CORONARY ANGIOGRAPHY N/A 04/20/2019   Procedure: LEFT HEART CATH AND CORONARY ANGIOGRAPHY;  Surgeon: Lyn Records, MD;  Location: [facility] INVASIVE CV LAB;  Service: Cardiovascular;  Laterality: N/A;    SOCIAL HISTORY:  Social History   Socioeconomic History  . Marital status: Married    Spouse name: [name]  . Number of children: 2  . Years of education: College  . Highest education  level: Not on file  Occupational History  . Occupation: Musician: [employer]  Tobacco Use  . Smoking status: Never Smoker  . Smokeless tobacco: Never Used  Vaping Use  . Vaping Use: Never used  Substance and Sexual Activity  . Alcohol use: No  . Drug use: No  . Sexual activity: Yes    Birth control/protection: Post-menopausal  Other Topics Concern  . Not on file  Social History Narrative   Patient is married ([name]) and lives at home with his husband.   Patient has two adult children.   Patient is working full-time.   Patient has a college education.   Patient is right-handed.   Patient drinks two cups of coffee and some soda.   Social Determinants of Health   Financial Resource Strain:   . Difficulty of Paying Living Expenses:   Food Insecurity:   . Worried About Programme researcher, broadcasting/film/video in the Last Year:   . Barista in the Last Year:   Transportation Needs:   . Freight forwarder (Medical):   Marland Kitchen Lack of Transportation (Non-Medical):   Physical Activity:   . Days of Exercise per Week:   . Minutes of Exercise per Session:   Stress:   . Feeling of Stress :   Social Connections:   . Frequency of Communication with Friends and Family:   . Frequency of Social Gatherings with Friends and Family:   . Attends Religious Services:   . Active Member of Clubs or Organizations:   . Attends Club  or Organization Meetings:   Marland Kitchen Marital Status:   Intimate Partner Violence:   . Fear of Current or Ex-Partner:   . Emotionally Abused:   Marland Kitchen Physically Abused:   . Sexually Abused:     FAMILY HISTORY:  Family History  Problem Relation Age of Onset  . Heart attack Father   . Parkinson's disease Father   . Heart disease Brother     CURRENT MEDICATIONS:  Current Outpatient Medications  Medication Sig Dispense Refill  . PHENobarbital (LUMINAL) 64.8 MG tablet Take 1.5 tablets (97.2 mg total) by mouth at bedtime. 140 tablet 3  . XARELTO 20 MG TABS  tablet Take 20 mg by mouth daily with supper.     Marland Kitchen albuterol (VENTOLIN HFA) 108 (90 Base) MCG/ACT inhaler Inhale 1-2 puffs into the lungs every 6 (six) hours as needed for wheezing or shortness of breath. 18 g 0  . atorvastatin (LIPITOR) 80 MG tablet Take 1 tablet (80 mg total) by mouth daily. 90 tablet 3   Current Facility-Administered Medications  Medication Dose Route Frequency Provider Last Rate Last Admin  . sodium chloride flush (NS) 0.9 % injection 3 mL  3 mL Intravenous Q12H Laqueta Linden, MD        ALLERGIES:  Allergies  Allergen Reactions  . Tegretol [Carbamazepine] Hives  . Keppra [Levetiracetam]     Feels outside her body.[name]  . Ketorolac Tromethamine   . Sulfa Antibiotics Hives    PHYSICAL EXAM:  Performance status (ECOG): 0 - Asymptomatic  Vitals:   03/12/20 1435  BP: (!) 144/88  Pulse: 69  Resp: 18  Temp: (!) 97.5 F (36.4 C)  SpO2: 97%   Wt Readings from Last 3 Encounters:  03/12/20 197 lb 14.4 oz (89.8 kg)  12/31/19 189 lb 15.9 oz (86.2 kg)  12/31/19 190 lb (86.2 kg)   Physical Exam Vitals reviewed.  Constitutional:      Appearance: Normal appearance. She is obese.  Cardiovascular:     Rate and Rhythm: Normal rate and regular rhythm.     Pulses: Normal pulses.     Heart sounds: Normal heart sounds.  Pulmonary:     Effort: Pulmonary effort is normal.     Breath sounds: Normal breath sounds.  Abdominal:     Palpations: Abdomen is soft. There is no hepatomegaly, splenomegaly or mass.     Tenderness: There is no abdominal tenderness.     Hernia: No hernia is present.  Neurological:     General: No focal deficit present.     Mental Status: She is alert and oriented to person, place, and time.  Psychiatric:        Mood and Affect: Mood normal.        Behavior: Behavior normal.     LABORATORY DATA:  I have reviewed the labs as listed.  CBC Latest Ref Rng & Units 08/16/2019 04/17/2019 03/26/2019  WBC 4.0 - 10.5 K/uL 10.3 8.0 8.8    Hemoglobin 12.0 - 15.0 g/dL 73.2 20.2 54.2  Hematocrit 36 - 46 % 41.7 41.2 41.5  Platelets 150 - 400 K/uL 301 285 299   CMP Latest Ref Rng & Units 08/16/2019 04/17/2019 03/26/2019  Glucose 70 - 99 mg/dL 95 88 98  BUN 6 - 20 mg/dL 70(W) 19 23(J)  Creatinine 0.44 - 1.00 mg/dL 6.28 3.15 1.76(H)  Sodium 135 - 145 mmol/L 142 139 136  Potassium 3.5 - 5.1 mmol/L 3.9 4.8 3.6  Chloride 98 - 111 mmol/L 106 102 100  CO2  22 - 32 mmol/L 25 28 26   Calcium 8.9 - 10.3 mg/dL 9.3 9.1 9.2  Total Protein 6.0 - 8.5 g/dL - - -  Total Bilirubin 0.0 - 1.2 mg/dL - - -  Alkaline Phos 39 - 117 IU/L - - -  AST 0 - 40 IU/L - - -  ALT 0 - 32 IU/L - - -      Component Value Date/Time   RBC 4.37 08/16/2019 1855   MCV 95.4 08/16/2019 1855   MCH 30.4 08/16/2019 1855   MCHC 31.9 08/16/2019 1855   RDW 12.9 08/16/2019 1855   LYMPHSABS 2.3 04/17/2019 1021   MONOABS 0.5 04/17/2019 1021   EOSABS 0.1 04/17/2019 1021   BASOSABS 0.0 04/17/2019 1021    DIAGNOSTIC IMAGING:  I have independently reviewed the scans and discussed with the patient. No results found.   ASSESSMENT:  1.  Unprovoked DVT and PE: -Presentation to the ER with right leg pain and shortness of breath on exertion, started 2 to 3 days ago. -Ultrasound of the lower extremities on 02/08/2019 was positive for acute occlusive DVT in the right popliteal and calf veins. - CT of the chest PE protocol on 02/08/2019 shows bulky bilateral clot burden and (left eccentric) saddle embolus.  CT evidence of heart strain. -She was admitted from 02/08/2019 through 02/10/2019 at [facility], started on anticoagulation, transitioned to Xarelto. -She had a history of left leg DVT 1993, few days after a C-section.  She was on limited duration Coumadin at that time. - She did not have any instigating factors prior to the latest DVT and PE. - 2D echo on 02/09/2019 shows ejection fraction of 60 to 65% with normal right ventricular cavity. 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  Based on her recent DVT and pulmonary embolism with clot burden, I have recommended indefinite anticoagulation. - She did not have any history of miscarriages.  I did not recommend any further hypercoagulable testing, as it would not help me decide the duration of anticoagulation. -She had a colonoscopy on 02/02/2018 which showed no endoscopic evidence of bleeding, diverticula, mass, polyps or tumor in the entire colon.  She also has yearly mammograms which were normal.  No B symptoms or any indication of occult malignancy.  She is a non-smoker.  No family history of DVT or malignancies.  PLAN:  1.  Unprovoked DVT and PE: -She is continuing Xarelto without any major bleeding issues. -D-dimer today was normal. -Benefits continue to outweigh risks for long-term anticoagulation.  We will reassess her in 1 year. -I have sent Xarelto refills to her pharmacy for 1 year.  2.  Partial complex seizure disorder: -Continue phenobarbital.  Well-controlled.   Orders placed this encounter:  No orders of the defined types were placed in this encounter.    02/04/2018, MD [facility] [phone]   I, [phone], am acting as a scribe for Dr. Drue Second.  I, Payton Mccallum MD, have reviewed the above documentation for accuracy and completeness, and I agree with the above.

## 2020-03-12 NOTE — Patient Instructions (Signed)
[facility] at [facility] Discharge Instructions  You were seen today by Dr. Ellin Saba. He went over your recent results. You can take Tylenol, or ibuprofen in limited quantities, for pain. Dr. Ellin Saba will see you back in 1 year for labs and follow up.   Thank you for choosing  Cancer Center at [facility] to provide your oncology and hematology care.  To afford each patient quality time with our provider, please arrive at least 15 minutes before your scheduled appointment time.   If you have a lab appointment with the Cancer Center please come in thru the Main Entrance and check in at the main information desk  You need to re-schedule your appointment should you arrive 10 or more minutes late.  We strive to give you quality time with our providers, and arriving late affects you and other patients whose appointments are after yours.  Also, if you no show three or more times for appointments you may be dismissed from the clinic at the providers discretion.     Again, thank you for choosing [facility]  Our hope is that these requests will decrease the amount of time that you wait before being seen by our physicians.       _____________________________________________________________  Should you have questions after your visit to [facility], please contact our office at [phone] between the hours of 8:00 a.m. and 4:30 p.m.  Voicemails left after 4:00 p.m. will not be returned until the following business day.  For prescription refill requests, have your pharmacy contact our office and allow 72 hours.    Cancer Center Support Programs:   > Cancer Support Group  2nd Tuesday of the month 1pm-2pm, Journey Room

## 2020-03-20 ENCOUNTER — Ambulatory Visit: Payer: Self-pay | Admitting: Cardiovascular Disease

## 2020-03-27 ENCOUNTER — Other Ambulatory Visit: Payer: Self-pay | Admitting: Neurology

## 2020-03-27 DIAGNOSIS — Z5320 Procedure and treatment not carried out because of patient's decision for unspecified reasons: Secondary | ICD-10-CM

## 2020-04-10 ENCOUNTER — Encounter (HOSPITAL_COMMUNITY): Payer: Self-pay

## 2020-05-15 ENCOUNTER — Other Ambulatory Visit: Payer: Self-pay

## 2020-05-19 ENCOUNTER — Ambulatory Visit: Payer: Self-pay | Admitting: Physician Assistant

## 2020-05-22 ENCOUNTER — Telehealth: Payer: Self-pay | Admitting: Neurology

## 2020-05-22 NOTE — Telephone Encounter (Signed)
..   Pt understands that although there may be some limitations with this type of visit, we will take all precautions to reduce any security or privacy concerns.  Pt understands that this will be treated like an in office visit and we will file with pt's insurance, and there may be a patient responsible charge related to this service. ? ?

## 2020-05-29 ENCOUNTER — Telehealth: Payer: Self-pay | Admitting: Neurology

## 2020-06-20 ENCOUNTER — Other Ambulatory Visit: Payer: Self-pay

## 2020-06-20 ENCOUNTER — Encounter: Payer: Self-pay | Admitting: Family Medicine

## 2020-06-20 ENCOUNTER — Ambulatory Visit: Payer: Self-pay | Admitting: Family Medicine

## 2020-06-20 VITALS — BP 146/81 | HR 67 | Temp 98.0°F | Ht 64.0 in | Wt 201.4 lb

## 2020-06-20 DIAGNOSIS — Z23 Encounter for immunization: Secondary | ICD-10-CM

## 2020-06-20 DIAGNOSIS — R569 Unspecified convulsions: Secondary | ICD-10-CM

## 2020-06-20 DIAGNOSIS — E785 Hyperlipidemia, unspecified: Secondary | ICD-10-CM

## 2020-06-20 DIAGNOSIS — I2782 Chronic pulmonary embolism: Secondary | ICD-10-CM

## 2020-06-20 DIAGNOSIS — I2510 Atherosclerotic heart disease of native coronary artery without angina pectoris: Secondary | ICD-10-CM

## 2020-06-20 DIAGNOSIS — F439 Reaction to severe stress, unspecified: Secondary | ICD-10-CM | POA: Insufficient documentation

## 2020-06-20 DIAGNOSIS — E782 Mixed hyperlipidemia: Secondary | ICD-10-CM | POA: Insufficient documentation

## 2020-06-20 DIAGNOSIS — E669 Obesity, unspecified: Secondary | ICD-10-CM

## 2020-06-20 DIAGNOSIS — Z0001 Encounter for general adult medical examination with abnormal findings: Secondary | ICD-10-CM

## 2020-06-20 NOTE — Assessment & Plan Note (Signed)
Stable.  Continue Xarelto 20mg  daily.  Check CBC today.

## 2020-06-20 NOTE — Patient Instructions (Addendum)
It was very nice to see you today!  We will start ozempic. Please send me a message in a few weeks to let me know how it is working for you.  We will give your flu vaccine.  Please come back soon to get your blood work done.  Take care, Dr Jerline Pain  Please try these tips to maintain a healthy lifestyle:   Eat at least 3 REAL meals and 1-2 snacks per day.  Aim for no more than 5 hours between eating.  If you eat breakfast, please do so within one hour of getting up.    Each meal should contain half fruits/vegetables, one quarter protein, and one quarter carbs (no bigger than a computer mouse)   Cut down on sweet beverages. This includes juice, soda, and sweet tea.     Drink at least 1 glass of water with each meal and aim for at least 8 glasses per day   Exercise at least 150 minutes every week.    Preventive Care 67-50 Years Old, Female Preventive care refers to visits with your health care provider and lifestyle choices that can promote health and wellness. This includes:  A yearly physical exam. This may also be called an annual well check.  Regular dental visits and eye exams.  Immunizations.  Screening for certain conditions.  Healthy lifestyle choices, such as eating a healthy diet, getting regular exercise, not using drugs or products that contain nicotine and tobacco, and limiting alcohol use. What can I expect for my preventive care visit? Physical exam Your health care provider will check your:  Height and weight. This may be used to calculate body mass index (BMI), which tells if you are at a healthy weight.  Heart rate and blood pressure.  Skin for abnormal spots. Counseling Your health care provider may ask you questions about your:  Alcohol, tobacco, and drug use.  Emotional well-being.  Home and relationship well-being.  Sexual activity.  Eating habits.  Work and work Statistician.  Method of birth control.  Menstrual cycle.  Pregnancy  history. What immunizations do I need?  Influenza (flu) vaccine  This is recommended every year. Tetanus, diphtheria, and pertussis (Tdap) vaccine  You may need a Td booster every 10 years. Varicella (chickenpox) vaccine  You may need this if you have not been vaccinated. Zoster (shingles) vaccine  You may need this after age 81. Measles, mumps, and rubella (MMR) vaccine  You may need at least one dose of MMR if you were born in 1957 or later. You may also need a second dose. Pneumococcal conjugate (PCV13) vaccine  You may need this if you have certain conditions and were not previously vaccinated. Pneumococcal polysaccharide (PPSV23) vaccine  You may need one or two doses if you smoke cigarettes or if you have certain conditions. Meningococcal conjugate (MenACWY) vaccine  You may need this if you have certain conditions. Hepatitis A vaccine  You may need this if you have certain conditions or if you travel or work in places where you may be exposed to hepatitis A. Hepatitis B vaccine  You may need this if you have certain conditions or if you travel or work in places where you may be exposed to hepatitis B. Haemophilus influenzae type b (Hib) vaccine  You may need this if you have certain conditions. Human papillomavirus (HPV) vaccine  If recommended by your health care provider, you may need three doses over 6 months. You may receive vaccines as individual doses or as  more than one vaccine together in one shot (combination vaccines). Talk with your health care provider about the risks and benefits of combination vaccines. What tests do I need? Blood tests  Lipid and cholesterol levels. These may be checked every 5 years, or more frequently if you are over 38 years old.  Hepatitis C test.  Hepatitis B test. Screening  Lung cancer screening. You may have this screening every year starting at age 6 if you have a 30-pack-year history of smoking and currently smoke or  have quit within the past 15 years.  Colorectal cancer screening. All adults should have this screening starting at age 24 and continuing until age 80. Your health care provider may recommend screening at age 40 if you are at increased risk. You will have tests every 1-10 years, depending on your results and the type of screening test.  Diabetes screening. This is done by checking your blood sugar (glucose) after you have not eaten for a while (fasting). You may have this done every 1-3 years.  Mammogram. This may be done every 1-2 years. Talk with your health care provider about when you should start having regular mammograms. This may depend on whether you have a family history of breast cancer.  BRCA-related cancer screening. This may be done if you have a family history of breast, ovarian, tubal, or peritoneal cancers.  Pelvic exam and Pap test. This may be done every 3 years starting at age 68. Starting at age 57, this may be done every 5 years if you have a Pap test in combination with an HPV test. Other tests  Sexually transmitted disease (STD) testing.  Bone density scan. This is done to screen for osteoporosis. You may have this scan if you are at high risk for osteoporosis. Follow these instructions at home: Eating and drinking  Eat a diet that includes fresh fruits and vegetables, whole grains, lean protein, and low-fat dairy.  Take vitamin and mineral supplements as recommended by your health care provider.  Do not drink alcohol if: ? Your health care provider tells you not to drink. ? You are pregnant, may be pregnant, or are planning to become pregnant.  If you drink alcohol: ? Limit how much you have to 0-1 drink a day. ? Be aware of how much alcohol is in your drink. In the [place] one drink equals one 12 oz bottle of beer (355 mL), one 5 oz glass of wine (148 mL), or one 1 oz glass of hard liquor (44 mL). Lifestyle  Take daily care of your teeth and gums.  Stay  active. Exercise for at least 30 minutes on 5 or more days each week.  Do not use any products that contain nicotine or tobacco, such as cigarettes, e-cigarettes, and chewing tobacco. If you need help quitting, ask your health care provider.  If you are sexually active, practice safe sex. Use a condom or other form of birth control (contraception) in order to prevent pregnancy and STIs (sexually transmitted infections).  If told by your health care provider, take low-dose aspirin daily starting at age 34. What's next?  Visit your health care provider once a year for a well check visit.  Ask your health care provider how often you should have your eyes and teeth checked.  Stay up to date on all vaccines. This information is not intended to replace advice given to you by your health care provider. Make sure you discuss any questions you have with your health  care provider. Document Revised: 04/06/2018 Document Reviewed: 04/06/2018 Elsevier Patient Education  2020 [employer].

## 2020-06-20 NOTE — Assessment & Plan Note (Signed)
Follows with neurology. On phenobarbital nightly per neurology.

## 2020-06-20 NOTE — Assessment & Plan Note (Signed)
Has been dealing with quite a bit of stress recently due to passing of her father.  Also with some marital issues.  She has a therapist but is possibly interested in trying different therapist.  Gave behavioral health handout with contact information today.

## 2020-06-20 NOTE — Assessment & Plan Note (Signed)
Continue Lipitor.  She is seeing cardiology.

## 2020-06-20 NOTE — Assessment & Plan Note (Signed)
Discussed treatment options.  She has tried diet and exercise and has had difficulty with losing weight.  We will try Ozempic. Discussed potential side effects. She will follow up in a few weeks via mychart.

## 2020-06-20 NOTE — Progress Notes (Addendum)
Chief Complaint:  Sarah Chung is a 59 y.o. female who presents today for her annual comprehensive physical exam.  She is a new patient.   Assessment/Plan:  Chronic Problems Addressed Today: Seizures (HCC) Follows with neurology. On phenobarbital nightly per neurology.   Stress Has been dealing with quite a bit of stress recently due to passing of her father.  Also with some marital issues.  She has a therapist but is possibly interested in trying different therapist.  Gave behavioral health handout with contact information today.  Obesity Discussed treatment options.  She has tried diet and exercise and has had difficulty with losing weight.  We will try Ozempic. Discussed potential side effects. She will follow up in a few weeks via mychart.   Dyslipidemia Check lipids.  Continue Lipitor 80 mg daily.  CAD in native artery Continue Lipitor.  She is seeing cardiology.  Pulmonary embolism (HCC) x2 Stable.  Continue Xarelto 20mg  daily.  Check CBC today.   Body mass index is 34.57 kg/m. / Obese    Preventative Healthcare: Gets mammogram and Pap smear done via OB/GYN.  Check CBC, CMET, TSH, lipid panel.  Patient Counseling(The following topics were reviewed and/or handout was given):  -Nutrition: Stressed importance of moderation in sodium/caffeine intake, saturated fat and cholesterol, caloric balance, sufficient intake of fresh fruits, vegetables, and fiber.  -Stressed the importance of regular exercise.   -Substance Abuse: Discussed cessation/primary prevention of tobacco, alcohol, or other drug use; driving or other dangerous activities under the influence; availability of treatment for abuse.   -Injury prevention: Discussed safety belts, safety helmets, smoke detector, smoking near bedding or upholstery.   -Sexuality: Discussed sexually transmitted diseases, partner selection, use of condoms, avoidance of unintended pregnancy and contraceptive alternatives.   -Dental health:  Discussed importance of regular tooth brushing, flossing, and dental visits.  -Health maintenance and immunizations reviewed. Please refer to Health maintenance section.  Return to care in 1 year for next preventative visit.     Subjective:  HPI:  She has no acute complaints today.  See A/P for status of chronic conditions.  Lifestyle Diet: Balanced. Plenty of fruits and vegetables.  Exercise: Exercises regularly.   Depression screen PHQ 2/9 06/20/2020  Decreased Interest 1  Down, Depressed, Hopeless 1  PHQ - 2 Score 2  Altered sleeping 2  Tired, decreased energy 1  Change in appetite 1  Trouble concentrating 0  Moving slowly or fidgety/restless 0  Suicidal thoughts 0  PHQ-9 Score 6  Difficult doing work/chores Not difficult at all    Health Maintenance Due  Topic Date Due  . Hepatitis C Screening  Never done  . TETANUS/TDAP  Never done     ROS: Per HPI, otherwise a complete review of systems was negative.   PMH:  The following were reviewed and entered/updated in epic: Past Medical History:  Diagnosis Date  . Allergy   . Clotting disorder (HCC)   . DVT (deep vein thrombosis) in pregnancy   . Gallstones   . Hyperlipidemia   . Pulmonary embolism (HCC)   . Seizures [facility])    Patient Active Problem List   Diagnosis Date Noted  . Dyslipidemia 06/20/2020  . Obesity 06/20/2020  . Stress 06/20/2020  . CAD in native artery 04/20/2019  . Pulmonary embolism (HCC) x2 02/09/2019  . Seizures (HCC) 01/09/2014  . Calculus of gallbladder with acute cholecystitis, without mention of obstruction 07/03/2013   Past Surgical History:  Procedure Laterality Date  . CHOLECYSTECTOMY N/A 07/02/2013  Procedure: LAPAROSCOPIC CHOLECYSTECTOMY WITH INTRAOPERATIVE CHOLANGIOGRAM;  Surgeon: Ernestene Mention, MD;  Location: [facility];  Service: General;  Laterality: N/A;  . LEFT HEART CATH AND CORONARY ANGIOGRAPHY N/A 04/20/2019   Procedure: LEFT HEART CATH AND CORONARY ANGIOGRAPHY;   Surgeon: Lyn Records, MD;  Location: [facility];  Service: Cardiovascular;  Laterality: N/A;    Family History  Problem Relation Age of Onset  . Heart attack Father   . Parkinson's disease Father   . Heart disease Father   . Heart disease Brother   . Heart attack Brother   . Mental illness Mother   . Graves' disease Mother   . Depression Sister   . Kidney disease Maternal Grandfather   . Heart attack Paternal Grandmother     Medications- reviewed and updated Current Outpatient Medications  Medication Sig Dispense Refill  . atorvastatin (LIPITOR) 80 MG tablet Take 1 tablet (80 mg total) by mouth daily. 90 tablet 3  . PHENobarbital (LUMINAL) 64.8 MG tablet TAKE 1.5 TABLET BY MOUTH EVERY NIGHT AT BEDTIME 135 tablet 5  . XARELTO 20 MG TABS tablet Take 1 tablet (20 mg total) by mouth daily with supper. 30 tablet 12   No current facility-administered medications for this visit.    Allergies-reviewed and updated Allergies  Allergen Reactions  . Tegretol [Carbamazepine] Hives  . Keppra [Levetiracetam]     Feels outside her body.[name]  . Ketorolac Tromethamine   . Sulfa Antibiotics Hives    Social History   Socioeconomic History  . Marital status: Married    Spouse name: [name]  . Number of children: 2  . Years of education: College  . Highest education level: Not on file  Occupational History  . Occupation: Musician: [employer]  Tobacco Use  . Smoking status: Never Smoker  . Smokeless tobacco: Never Used  Vaping Use  . Vaping Use: Never used  Substance and Sexual Activity  . Alcohol use: No  . Drug use: No  . Sexual activity: Yes    Birth control/protection: Post-menopausal  Other Topics Concern  . Not on file  Social History Narrative   Patient is married ([name]) and lives at home with his husband.   Patient has two adult children.   Patient is working full-time.   Patient has a college education.   Patient is right-handed.    Patient drinks two cups of coffee and some soda.   Social Determinants of Health   Financial Resource Strain:   . Difficulty of Paying Living Expenses: Not on file  Food Insecurity:   . Worried About Programme researcher, broadcasting/film/video in the Last Year: Not on file  . Ran Out of Food in the Last Year: Not on file  Transportation Needs:   . Lack of Transportation (Medical): Not on file  . Lack of Transportation (Non-Medical): Not on file  Physical Activity:   . Days of Exercise per Week: Not on file  . Minutes of Exercise per Session: Not on file  Stress:   . Feeling of Stress : Not on file  Social Connections:   . Frequency of Communication with Friends and Family: Not on file  . Frequency of Social Gatherings with Friends and Family: Not on file  . Attends Religious Services: Not on file  . Active Member of Clubs or Organizations: Not on file  . Attends Banker Meetings: Not on file  . Marital Status: Not on file  Objective:  Physical Exam: BP (!) 146/81   Pulse 67   Temp 98 F (36.7 C) (Temporal)   Ht 5\' 4"  (1.626 m)   Wt 201 lb 6.4 oz (91.4 kg)   SpO2 98%   BMI 34.57 kg/m   Body mass index is 34.57 kg/m. Wt Readings from Last 3 Encounters:  06/20/20 201 lb 6.4 oz (91.4 kg)  03/12/20 197 lb 14.4 oz (89.8 kg)  12/31/19 189 lb 15.9 oz (86.2 kg)   Gen: NAD, resting comfortably HEENT: TMs normal bilaterally. OP clear. No thyromegaly noted.  CV: RRR with no murmurs appreciated Pulm: NWOB, CTAB with no crackles, wheezes, or rhonchi GI: Normal bowel sounds present. Soft, Nontender, Nondistended. MSK: no edema, cyanosis, or clubbing noted Skin: warm, dry Neuro: CN2-12 grossly intact. Strength 5/5 in upper and lower extremities. Reflexes symmetric and intact bilaterally.  Psych: Normal affect and thought content     Tammie Yanda M. 01/02/20, MD 06/23/2020 9:29 AM

## 2020-06-20 NOTE — Assessment & Plan Note (Signed)
Check lipids.  Continue Lipitor 80 mg daily. 

## 2020-06-24 ENCOUNTER — Other Ambulatory Visit: Payer: Self-pay

## 2020-06-24 DIAGNOSIS — I2510 Atherosclerotic heart disease of native coronary artery without angina pectoris: Secondary | ICD-10-CM

## 2020-06-24 DIAGNOSIS — Z0001 Encounter for general adult medical examination with abnormal findings: Secondary | ICD-10-CM

## 2020-06-25 LAB — COMPREHENSIVE METABOLIC PANEL
AG Ratio: 1.3 (calc) (ref 1.0–2.5)
ALT: 22 U/L (ref 6–29)
AST: 21 U/L (ref 10–35)
Albumin: 4.5 g/dL (ref 3.6–5.1)
Alkaline phosphatase (APISO): 126 U/L (ref 37–153)
BUN: 19 mg/dL (ref 7–25)
CO2: 26 mmol/L (ref 20–32)
Calcium: 9.8 mg/dL (ref 8.6–10.4)
Chloride: 101 mmol/L (ref 98–110)
Creat: 0.96 mg/dL (ref 0.50–0.99)
Globulin: 3.4 g/dL (calc) (ref 1.9–3.7)
Glucose, Bld: 71 mg/dL (ref 65–99)
Potassium: 4.4 mmol/L (ref 3.5–5.3)
Sodium: 139 mmol/L (ref 135–146)
Total Bilirubin: 0.5 mg/dL (ref 0.2–1.2)
Total Protein: 7.9 g/dL (ref 6.1–8.1)

## 2020-06-25 LAB — CBC
HCT: 40.5 % (ref 35.0–45.0)
Hemoglobin: 13.8 g/dL (ref 11.7–15.5)
MCH: 31 pg (ref 27.0–33.0)
MCHC: 34.1 g/dL (ref 32.0–36.0)
MCV: 91 fL (ref 80.0–100.0)
MPV: 10.7 fL (ref 7.5–12.5)
Platelets: 297 10*3/uL (ref 140–400)
RBC: 4.45 10*6/uL (ref 3.80–5.10)
RDW: 12.2 % (ref 11.0–15.0)
WBC: 8.7 10*3/uL (ref 3.8–10.8)

## 2020-06-25 LAB — LIPID PANEL
Cholesterol: 177 mg/dL (ref <200)
HDL: 63 mg/dL (ref >=50)
LDL Cholesterol (Calc): 99 mg/dL (calc)
Non-HDL Cholesterol (Calc): 114 mg/dL (calc) (ref <130)
Total CHOL/HDL Ratio: 2.8 (calc) (ref <5.0)
Triglycerides: 63 mg/dL (ref <150)

## 2020-06-25 LAB — TSH: TSH: 0.64 mIU/L (ref 0.40–4.50)

## 2020-06-25 NOTE — Progress Notes (Signed)
Please inform patient of the following:  Blood work is all NORMAL.  Katina Degree. Jimmey Ralph, MD 06/25/2020 8:06 AM

## 2020-07-08 NOTE — Progress Notes (Deleted)
Cardiology Office Note    Date:  07/08/2020   ID:  Sarah Chung, DOB 01/21/1961, MRN [id number]  PCP:  Ardith Dark, MD  Cardiologist: Prentice Docker, MD (Inactive) EPS: None  No chief complaint on file.   History of Present Illness:  Sarah Chung is a 59 y.o. female with history of nonobstructive CAD on cath 04/20/2019 with 40 to 50% circumflex felt to be borderline significant by FFR.  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  In our office 08/2019 was doing well  Past Medical History:  Diagnosis Date  . Allergy   . Clotting disorder (HCC)   . DVT (deep vein thrombosis) in pregnancy   . Gallstones   . Hyperlipidemia   . Pulmonary embolism (HCC)   . Seizures (HCC)     Past Surgical History:  Procedure Laterality Date  . CHOLECYSTECTOMY N/A 07/02/2013   Procedure: LAPAROSCOPIC CHOLECYSTECTOMY WITH INTRAOPERATIVE CHOLANGIOGRAM;  Surgeon: Ernestene Mention, MD;  Location: [facility];  Service: General;  Laterality: N/A;  . LEFT HEART CATH AND CORONARY ANGIOGRAPHY N/A 04/20/2019   Procedure: LEFT HEART CATH AND CORONARY ANGIOGRAPHY;  Surgeon: Lyn Records, MD;  Location: [facility];  Service: Cardiovascular;  Laterality: N/A;    Current Medications: No outpatient medications have been marked as taking for the 07/14/20 encounter (Appointment) with Dyann Kief, PA-C.     Allergies:   Tegretol [carbamazepine], Keppra [levetiracetam], Ketorolac tromethamine, and Sulfa antibiotics   Social History   Socioeconomic History  . Marital status: Married    Spouse name: Sarah Chung  . Number of children: 2  . Years of education: College  . Highest education level: Not on file  Occupational History  . Occupation: Musician: [employer]  Tobacco Use  . Smoking status: Never Smoker  . Smokeless tobacco: Never Used  Vaping Use   . Vaping Use: Never used  Substance and Sexual Activity  . Alcohol use: No  . Drug use: No  . Sexual activity: Yes    Birth control/protection: Post-menopausal  Other Topics Concern  . Not on file  Social History Narrative   Patient is married ([name]) and lives at home with his husband.   Patient has two adult children.   Patient is working full-time.   Patient has a college education.   Patient is right-handed.   Patient drinks two cups of coffee and some soda.   Social Determinants of Health   Financial Resource Strain:   . Difficulty of Paying Living Expenses: Not on file  Food Insecurity:   . Worried About Programme researcher, broadcasting/film/video in the Last Year: Not on file  . Ran Out of Food in the Last Year: Not on file  Transportation Needs:   . Lack of Transportation (Medical): Not on file  . Lack of Transportation (Non-Medical): Not on file  Physical Activity:   . Days of Exercise per Week: Not on file  . Minutes of Exercise per Session: Not on file  Stress:   . Feeling of Stress : Not on file  Social Connections:   . Frequency of Communication with Friends and Family: Not on file  . Frequency of Social Gatherings with Friends and Family: Not on file  . Attends Religious Services: Not on file  . Active Member of Clubs or Organizations: Not on file  . Attends Club or  Organization Meetings: Not on file  . Marital Status: Not on file     Family History:  The patient's ***family history includes Depression in her sister; [name] disease in her mother; Heart attack in her brother, father, and paternal grandmother; Heart disease in her brother and father; Kidney disease in her maternal grandfather; Mental illness in her mother; Parkinson's disease in her father.   ROS:   Please see the history of present illness.    ROS All other systems reviewed and are negative.   PHYSICAL EXAM:   VS:  There were no vitals taken for this visit.  Physical Exam  GEN: Well nourished, well developed,  in no acute distress  HEENT: normal  Neck: no JVD, carotid bruits, or masses Cardiac:RRR; no murmurs, rubs, or gallops  Respiratory:  clear to auscultation bilaterally, normal work of breathing GI: soft, nontender, nondistended, + BS Ext: without cyanosis, clubbing, or edema, Good distal pulses bilaterally MS: no deformity or atrophy  Skin: warm and dry, no rash Neuro:  Alert and Oriented x 3, Strength and sensation are intact Psych: euthymic mood, full affect  Wt Readings from Last 3 Encounters:  06/20/20 201 lb 6.4 oz (91.4 kg)  03/12/20 197 lb 14.4 oz (89.8 kg)  12/31/19 189 lb 15.9 oz (86.2 kg)      Studies/Labs Reviewed:   EKG:  EKG is*** ordered today.  The ekg ordered today demonstrates ***  Recent Labs: 06/24/2020: ALT 22; BUN 19; Creat 0.96; Hemoglobin 13.8; Platelets 297; Potassium 4.4; Sodium 139; TSH 0.64   Lipid Panel    Component Value Date/Time   CHOL 177 06/24/2020 1353   TRIG 63 06/24/2020 1353   HDL 63 06/24/2020 1353   CHOLHDL 2.8 06/24/2020 1353   VLDL 17 06/15/2019 0845   LDLCALC 99 06/24/2020 1353    Additional studies/ records that were reviewed today include:  Cardiac cath 04/20/2019  Mild to moderate LAD, circumflex, and right coronary calcification.  25 to 30% proximal to mid LAD.  40 to 50% eccentric proximal to mid circumflex.  30% mid RCA.  Normal LV function.  LVEDP is normal.   RECOMMENDATIONS:    Optimal medical therapy for nonobstructive coronary atherosclerosis: Lifestyle modifications as needed for heart health, aggressive lipid-lowering, assessment of glycemic control, blood pressure control.  In context of clinical symptoms, intervention is not appropriate.  FFR in circumflex is borderline significant.  This should be followed clinically over time with PCI to be considered if symptoms not controllable with medical therapy.     ASSESSMENT:    1. Coronary artery disease involving native coronary artery of native heart  without angina pectoris   2. Essential hypertension   3. Other acute pulmonary embolism, unspecified whether acute cor pulmonale present (HCC)      PLAN:  In order of problems listed above:   Nonobstructive CAD on cath 04/20/2019 with 40 to 50% proximal to mid left circumflex which was borderline significant on FFR.  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  Concerns regarding medicines are outlined above.  Medication changes, Labs and Tests ordered today are listed in the Patient Instructions below. There are no Patient Instructions on file for this visit.   Signed, Jacolyn Reedy, PA-C  07/08/2020 3:20 PM    [facility] [address], [place], [place]  [address] Phone: [phone]; Fax: [phone]

## 2020-07-09 ENCOUNTER — Encounter: Payer: Self-pay | Admitting: Family Medicine

## 2020-07-14 ENCOUNTER — Ambulatory Visit: Payer: Self-pay | Admitting: Physician Assistant

## 2020-07-14 DIAGNOSIS — I10 Essential (primary) hypertension: Secondary | ICD-10-CM

## 2020-07-14 DIAGNOSIS — I2510 Atherosclerotic heart disease of native coronary artery without angina pectoris: Secondary | ICD-10-CM

## 2020-07-14 DIAGNOSIS — I2699 Other pulmonary embolism without acute cor pulmonale: Secondary | ICD-10-CM

## 2020-07-17 ENCOUNTER — Ambulatory Visit: Payer: Self-pay | Admitting: Neurology

## 2020-08-11 NOTE — Progress Notes (Signed)
Virtual Visit via Telephone Note   This visit type was conducted due to national recommendations for restrictions regarding the COVID-19 Pandemic (e.g. social distancing) in an effort to limit this patient's exposure and mitigate transmission in our community.  Due to her co-morbid illnesses, this patient is at least at moderate risk for complications without adequate follow up.  This format is felt to be most appropriate for this patient at this time.  The patient did not have access to video technology/had technical difficulties with video requiring transitioning to audio format only (telephone).  All issues noted in this document were discussed and addressed.  No physical exam could be performed with this format.  Please refer to the patient's chart for her  consent to telehealth for [facility].    Date:  08/18/2020   ID:  Sarah Chung, DOB March 13, 1961, MRN [id number] The patient was identified using 2 identifiers.  Patient Location: Home Provider Location: Office/Clinic  PCP:  Ardith Dark, MD  Cardiologist:  Prentice Docker, MD (Inactive)  Electrophysiologist:  None   Evaluation Performed:  Follow-Up Visit  Chief Complaint:  Follow up  History of Present Illness:    Sarah Chung is a 60 y.o. 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  Was last seen in our office 08/2019 at which time she was drinking a lot of excessive caffeine and was asked to cut back.  Was also under a lot of stress.  Patient switched to virtual because she was exposed to Covid19. Hasn't been able to get tested. Denies chest pain, dyspnea, dizziness or presyncope. Has some muscle aches on lipitor. Walks a lot. Is self pay and having to pay over $400 for Xarelto. Trying to lose  weight.   The patient does not have symptoms concerning for COVID-19 infection (fever, chills, cough, or new shortness of breath).    Past Medical History:  Diagnosis Date  . Allergy   . Clotting disorder (HCC)   . DVT (deep vein thrombosis) in pregnancy   . Gallstones   . Hyperlipidemia   . Pulmonary embolism (HCC)   . Seizures (HCC)    Past Surgical History:  Procedure Laterality Date  . CHOLECYSTECTOMY N/A 07/02/2013   Procedure: LAPAROSCOPIC CHOLECYSTECTOMY WITH INTRAOPERATIVE CHOLANGIOGRAM;  Surgeon: Ernestene Mention, MD;  Location: [facility] ORS;  Service: General;  Laterality: N/A;  . LEFT HEART CATH AND CORONARY ANGIOGRAPHY N/A 04/20/2019   Procedure: LEFT HEART CATH AND CORONARY ANGIOGRAPHY;  Surgeon: Lyn Records, MD;  Location: [facility] INVASIVE CV LAB;  Service: Cardiovascular;  Laterality: N/A;     Current Meds  Medication Sig  . atorvastatin (LIPITOR) 80 MG tablet Take 1 tablet (80 mg total) by mouth daily.  Marland Kitchen ezetimibe (ZETIA) 10 MG tablet Take 1 tablet (10 mg total) by mouth daily.  Marland Kitchen PHENobarbital (LUMINAL) 64.8 MG tablet TAKE 1.5 TABLET BY MOUTH EVERY NIGHT AT BEDTIME  . XARELTO 20 MG TABS tablet Take 1 tablet (20 mg total) by mouth daily with supper.     Allergies:   Tegretol [carbamazepine], Keppra [levetiracetam], Ketorolac tromethamine, and Sulfa antibiotics   Social History   Tobacco Use  . Smoking status: Never Smoker  . Smokeless tobacco: Never Used  Vaping Use  . Vaping Use: Never used  Substance Use Topics  .  Alcohol use: No  . Drug use: No     Family Hx: The patient's family history includes Depression in her sister; Berenice Primas' disease in her mother; Heart attack in her brother, father, and paternal grandmother; Heart disease in her brother and father; Kidney disease in her maternal grandfather; Mental illness in her mother; Parkinson's disease in her father.  ROS:   Please see the history of present illness.      All other systems reviewed and are  negative.   Prior CV studies:   The following studies were reviewed today:  Cardiac catheterization 04/20/2019:    Mild to moderate LAD, circumflex, and right coronary calcification.  25 to 30% proximal to mid LAD.  40 to 50% eccentric proximal to mid circumflex.  30% mid RCA.  Normal LV function.  LVEDP is normal.   RECOMMENDATIONS:    Optimal medical therapy for nonobstructive coronary atherosclerosis: Lifestyle modifications as needed for heart health, aggressive lipid-lowering, assessment of glycemic control, blood pressure control.  In context of clinical symptoms, intervention is not appropriate.  FFR in circumflex is borderline significant.  This should be followed clinically over time with PCI to be considered if symptoms not controllable with medical therapy.      Echocardiogram 02/09/2019:    1. The left ventricle has normal systolic function with an ejection fraction of 60-65%. The cavity size was normal. There is mildly increased left ventricular wall thickness. Left ventricular diastolic Doppler parameters are consistent with impaired  relaxation. Indeterminate filling pressures The E/e' is 8-15. No evidence of left ventricular regional wall motion abnormalities.  2. The mitral valve is grossly normal.  3. The tricuspid valve is grossly normal.  4. The aortic valve is tricuspid. Aortic valve regurgitation is trivial by color flow Doppler. No stenosis of the aortic valve.  5. The inferior vena cava was dilated in size with <50% respiratory variability.    Labs/Other Tests and Data Reviewed:    EKG:  No ECG reviewed.  Recent Labs: 06/24/2020: ALT 22; BUN 19; Creat 0.96; Hemoglobin 13.8; Platelets 297; Potassium 4.4; Sodium 139; TSH 0.64   Recent Lipid Panel Lab Results  Component Value Date/Time   CHOL 177 06/24/2020 01:53 PM   TRIG 63 06/24/2020 01:53 PM   HDL 63 06/24/2020 01:53 PM   CHOLHDL 2.8 06/24/2020 01:53 PM   LDLCALC 99 06/24/2020 01:53 PM     Wt Readings from Last 3 Encounters:  08/18/20 195 lb (88.5 kg)  06/20/20 201 lb 6.4 oz (91.4 kg)  03/12/20 197 lb 14.4 oz (89.8 kg)     Risk Assessment/Calculations:      Objective:    Vital Signs:  BP 132/78   Pulse 73   Ht 5' 4.5" (1.638 m)   Wt 195 lb (88.5 kg)   BMI 32.95 kg/m    VITAL SIGNS:  reviewed  ASSESSMENT & PLAN:    Nonobstructive CAD on cath 04/20/2019 mild to moderate LAD, circumflex, RCA-no chest pain. LDL 99.   Hypertension BP is stable, will try to send her a cuff. She's self pay.  History of pulmonary embolus and DVT on lifelong Xarelto. Labs reviewed from 06/2020 stable.  Stress and anxiety-seeing counselor  HLD-LDL 99 on lipitor 80 mg daily. Will add zetia 10 mg once daily. She'll check and see what her cost would be. If she is able to afford check her FLP and LFT's in 3 months.       COVID-19 Education: The signs and symptoms of COVID-19 were discussed  with the patient and how to seek care for testing (follow up with PCP or arrange E-visit).   The importance of social distancing was discussed today.  Time:   Today, I have spent 22:40 minutes with the patient with telehealth technology discussing the above problems.     Medication Adjustments/Labs and Tests Ordered: Current medicines are reviewed at length with the patient today.  Concerns regarding medicines are outlined above.   Tests Ordered: Orders Placed This Encounter  Procedures  . Lipid Profile  . Hepatic function panel    Medication Changes: Meds ordered this encounter  Medications  . ezetimibe (ZETIA) 10 MG tablet    Sig: Take 1 tablet (10 mg total) by mouth daily.    Dispense:  30 tablet    Refill:  11    Follow Up:  In Person in 1 year(s)-Dr. Wyline Mood  Signed, Jacolyn Reedy, PA-C  08/18/2020 1:20 PM    [facility]

## 2020-08-13 ENCOUNTER — Other Ambulatory Visit: Payer: Self-pay

## 2020-08-13 MED ORDER — ATORVASTATIN CALCIUM 80 MG PO TABS: 80.0000 mg | ORAL_TABLET | Freq: Every day | ORAL | 3 refills | Status: DC

## 2020-08-18 ENCOUNTER — Other Ambulatory Visit: Payer: Self-pay

## 2020-08-18 ENCOUNTER — Encounter: Payer: Self-pay | Admitting: Physician Assistant

## 2020-08-18 ENCOUNTER — Telehealth: Payer: Self-pay | Admitting: Licensed Clinical Social Worker

## 2020-08-18 ENCOUNTER — Telehealth (INDEPENDENT_AMBULATORY_CARE_PROVIDER_SITE_OTHER): Payer: Self-pay | Admitting: Physician Assistant

## 2020-08-18 VITALS — BP 132/78 | HR 73 | Ht 64.5 in | Wt 195.0 lb

## 2020-08-18 DIAGNOSIS — Z86711 Personal history of pulmonary embolism: Secondary | ICD-10-CM

## 2020-08-18 DIAGNOSIS — I10 Essential (primary) hypertension: Secondary | ICD-10-CM

## 2020-08-18 DIAGNOSIS — F439 Reaction to severe stress, unspecified: Secondary | ICD-10-CM

## 2020-08-18 DIAGNOSIS — I2510 Atherosclerotic heart disease of native coronary artery without angina pectoris: Secondary | ICD-10-CM

## 2020-08-18 DIAGNOSIS — E785 Hyperlipidemia, unspecified: Secondary | ICD-10-CM

## 2020-08-18 MED ORDER — EZETIMIBE 10 MG PO TABS: 10.0000 mg | ORAL_TABLET | Freq: Every day | ORAL | 11 refills | Status: DC

## 2020-08-18 NOTE — Patient Instructions (Signed)
Medication Instructions:  Your physician has recommended you make the following change in your medication:   Start Zetia 10 mg Daily   *If you need a refill on your cardiac medications before your next appointment, please call your pharmacy*   Lab Work: Your physician recommends that you return for lab work in: 3 Months   If you have labs (blood work) drawn today and your tests are completely normal, you will receive your results only by: Marland Kitchen MyChart Message (if you have MyChart) OR . A paper copy in the mail If you have any lab test that is abnormal or we need to change your treatment, we will call you to review the results.   Testing/Procedures: NONE    Follow-Up: At [facility], you and your health needs are our priority.  As part of our continuing mission to provide you with exceptional heart care, we have created designated Provider Care Teams.  These Care Teams include your primary Cardiologist (physician) and Advanced Practice Providers (APPs -  Physician Assistants and Nurse Practitioners) who all work together to provide you with the care you need, when you need it.  We recommend signing up for the patient portal called "MyChart".  Sign up information is provided on this After Visit Summary.  MyChart is used to connect with patients for Virtual Visits (Telemedicine).  Patients are able to view lab/test results, encounter notes, upcoming appointments, etc.  Non-urgent messages can be sent to your provider as well.   To learn more about what you can do with MyChart, go to [email or web address].    Your next appointment:   1 year(s)  The format for your next appointment:   In Person  Provider:   Nona Dell, MD or Dina Rich, MD   Other Instructions Thank you for choosing [facility]!

## 2020-08-18 NOTE — Telephone Encounter (Signed)
CSW referred to assist patient with obtaining a BP cuff. CSW contacted patient to inform cuff will be delivered to home. Patient grateful for support and assistance. CSW available as needed. Jackie Arpita Fentress, LCSW, CCSW-MCS [phone]  

## 2020-08-20 ENCOUNTER — Other Ambulatory Visit: Payer: Self-pay | Admitting: *Deleted

## 2020-08-20 ENCOUNTER — Encounter: Payer: Self-pay | Admitting: Family Medicine

## 2020-08-20 MED ORDER — OZEMPIC (0.25 OR 0.5 MG/DOSE) 2 MG/1.5ML ~~LOC~~ SOPN: 0.2500 mg | PEN_INJECTOR | SUBCUTANEOUS | 1 refills | Status: DC

## 2020-08-25 ENCOUNTER — Encounter (HOSPITAL_COMMUNITY): Payer: Self-pay

## 2020-08-27 ENCOUNTER — Ambulatory Visit: Payer: Self-pay | Admitting: Neurology

## 2020-08-27 ENCOUNTER — Telehealth: Payer: Self-pay | Admitting: Neurology

## 2020-08-27 ENCOUNTER — Encounter: Payer: Self-pay | Admitting: Neurology

## 2020-08-27 VITALS — BP 154/90 | HR 71 | Ht 64.0 in | Wt 200.0 lb

## 2020-08-27 DIAGNOSIS — Z5320 Procedure and treatment not carried out because of patient's decision for unspecified reasons: Secondary | ICD-10-CM

## 2020-08-27 DIAGNOSIS — G40909 Epilepsy, unspecified, not intractable, without status epilepticus: Secondary | ICD-10-CM | POA: Insufficient documentation

## 2020-08-27 DIAGNOSIS — Z780 Asymptomatic menopausal state: Secondary | ICD-10-CM

## 2020-08-27 MED ORDER — PHENOBARBITAL 64.8 MG PO TABS: ORAL_TABLET | ORAL | 5 refills | Status: DC

## 2020-08-27 NOTE — Patient Instructions (Signed)
Complicated Grief Grief is a normal response to the death of someone close to you. Feelings of fear, anger, and guilt can affect almost everyone who loses a loved one. It is also common to have symptoms of depression while you are grieving. These include problems with sleep, loss of appetite, and lack of energy. They may last for weeks or months after a loss. Complicated grief is different from normal grief or depression. Normal grieving involves sadness and feelings of loss, but those feelings get better and heal over time. Complicated grief is a severe type of grief that lasts for a long time, usually for several months to a year or longer. It interferes with your ability to function normally. Complicated grief may require treatment from a mental health care provider. What are the causes? The cause of this condition is not known. It is not clear why some people continue to struggle with grief and others do not. What increases the risk? You are more likely to develop this condition if:  The death of your loved one was sudden or unexpected.  The death of your loved one was due to a violent event.  Your loved one died from suicide.  Your loved one was a child or a young person.  You were very close to your loved one, or you were dependent on him or her.  You have a history of depression or anxiety. What are the signs or symptoms? Symptoms of this condition include:  Feeling disbelief or having a lack of emotion (numbness).  Being unable to enjoy good memories of your loved one.  Needing to avoid anything or anyone that reminds you of your loved one.  Being unable to stop thinking about the death.  Feeling intense anger or guilt.  Feeling alone and hopeless.  Feeling that your life is meaningless and empty.  Losing the desire to move on with your life. How is this diagnosed? This condition may be diagnosed based on:  Your symptoms. Complicated grief will be diagnosed if you have  ongoing symptoms of grief for 6-12 months or longer.  The effect of symptoms on your life. You may be diagnosed with this condition if your symptoms are interfering with your ability to live your life. Your health care provider may recommend that you see a mental health care provider. Many symptoms of depression are similar to the symptoms of complicated grief. It is important to be evaluated for complicated grief along with other mental health conditions. How is this treated? This condition is most commonly treated with talk therapy. This therapy is offered by a mental health specialist (psychiatrist). During therapy:  You will learn healthy ways to cope with the loss of your loved one.  Your mental health care provider may recommend antidepressant medicines.   Follow these instructions at home: Lifestyle  Take care of yourself. ? Eat on a regular basis, and maintain a healthy diet. Eat plenty of fruits, vegetables, lean protein, and whole grains. ? Try to get some exercise each day. Aim for 30 minutes of exercise on most days of the week. ? Keep a consistent sleep schedule. Try to get 8 or more hours of sleep each night. ? Start doing the things that you used to enjoy.  Do not use drugs or alcohol to ease your symptoms.  Spend time with friends and loved ones.   General instructions  Take over-the-counter and prescription medicines only as told by your health care provider.  Consider joining a grief (  bereavement) support group to help you deal with your loss.  Keep all follow-up visits as told by your health care provider. This is important. Contact a health care provider if:  Your symptoms prevent you from functioning normally.  Your symptoms do not get better with treatment. Get help right away if:  You have serious thoughts about hurting yourself or someone else.  You have suicidal feelings. If you ever feel like you may hurt yourself or others, or have thoughts about  taking your own life, get help right away. You can go to your nearest emergency department or call:  Your local emergency services (911 in the [place]  A suicide crisis helpline, such as the National Suicide Prevention Lifeline at 1-800-273-8255. This is open 24 hours a day. Summary  Complicated grief is a severe type of grief that lasts for a long time. This grief is not likely to go away on its own. Get the help you need.  Some griefs are more difficult than others and can cause this condition. You may need a certain type of treatment to help you recover if the loss of your loved one was sudden, violent, or due to suicide.  You may feel guilty about moving on with your life. Getting help does not mean that you are forgetting your loved one. It means that you are taking care of yourself.  Complicated grief is best treated with talk therapy. Medicines may also be prescribed.  Seek the help you need, and find support that will help you recover. This information is not intended to replace advice given to you by your health care provider. Make sure you discuss any questions you have with your health care provider. Document Revised: 01/17/2020 Document Reviewed: 01/17/2020 Elsevier Patient Education  2021 [employer]  
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
## 2020-08-27 NOTE — Telephone Encounter (Signed)
Pt ok with metformin,  Strength and sig?

## 2020-08-27 NOTE — Telephone Encounter (Signed)
Please make pt a break the glass in her chart per Dr. Vickey Huger

## 2020-08-28 ENCOUNTER — Other Ambulatory Visit: Payer: Self-pay | Admitting: *Deleted

## 2020-08-28 MED ORDER — METFORMIN HCL 500 MG PO TABS: 500.0000 mg | ORAL_TABLET | Freq: Every day | ORAL | 1 refills | Status: DC

## 2020-08-28 NOTE — Telephone Encounter (Signed)
Rx send to pharmacy  

## 2020-12-18 ENCOUNTER — Other Ambulatory Visit: Payer: Self-pay | Admitting: Family Medicine

## 2020-12-24 ENCOUNTER — Ambulatory Visit (INDEPENDENT_AMBULATORY_CARE_PROVIDER_SITE_OTHER): Payer: Self-pay | Admitting: Family Medicine

## 2020-12-24 ENCOUNTER — Other Ambulatory Visit: Payer: Self-pay

## 2020-12-24 ENCOUNTER — Encounter: Payer: Self-pay | Admitting: Family Medicine

## 2020-12-24 VITALS — BP 132/80 | HR 77 | Temp 98.1°F | Ht 64.5 in | Wt 200.0 lb

## 2020-12-24 DIAGNOSIS — M7061 Trochanteric bursitis, right hip: Secondary | ICD-10-CM

## 2020-12-24 DIAGNOSIS — M79604 Pain in right leg: Secondary | ICD-10-CM

## 2020-12-24 DIAGNOSIS — Z86718 Personal history of other venous thrombosis and embolism: Secondary | ICD-10-CM

## 2020-12-24 MED ORDER — TRIAMCINOLONE ACETONIDE 40 MG/ML IJ SUSP
40.0000 mg | Freq: Once | INTRAMUSCULAR | Status: AC
  Administered 2020-12-24: 40 mg via INTRA_ARTICULAR

## 2020-12-24 NOTE — Progress Notes (Signed)
Subjective  CC:  Chief Complaint  Patient presents with  . Leg Pain    Right leg, tripped and caught herself 2 weeks ago. Whole leg pain. Would like a d dimer    Same day acute visit; PCP not available. New pt to me. Chart reviewed.   HPI: Sarah Chung is a 60 y.o. female who presents to the office today to address the problems listed above in the chief complaint.  60 year old female with history of saddle pulmonary embolism 2 years ago now on Xarelto.  DVT and PE were unprovoked.  She has chronic anticoagulation.  Reports approximately 2 weeks ago she tripped up her stairs, and and catching herself trying to prevent a fall she landed abruptly on her right leg.  She felt acute, sharp right hip pain.  She did not fall.  However, ever since she has had right leg pain.  It is progressive in nature.  She felt immediate pain with soreness, but now has right hip tenderness throbbing pain in the upper thigh and a limp.  She has increased pain when climbing stairs.  She cannot lay on that side at night.  She denies back pain or shooting radicular symptoms.  No leg weakness, knee pain or ankle pain.  No lower extremity swelling or calf pain.  No fevers or chills.  She has not taken any pain medications.  She has her father's memorial service this weekend and wants to be sure that she will be able to do what she has to do and ensure there is no DVT.  She has had no recent travel.  She denies shortness of breath or chest pain.  No palpitations.   Assessment  1. Right leg pain   2. Greater trochanteric bursitis of right hip   3. History of DVT (deep vein thrombosis)      Plan   Right leg pain: History and clinical findings most consistent with right quad strain and secondary hip bursitis.  Wells criteria for DVT score was -1 making the risk of DVT very low.  She is on Xarelto.  Education given.  Recommend stretches, ice to the hip, rest and discussed expectations after her steroid injection today.  We  will check D-dimer as a negative will be very reassuring.  If symptoms worsen or she develops edema, would order an ultrasound.  Patient understands and agrees with care plan.  She will follow-up with her PCP if things are not improving.  Follow up: Return if symptoms worsen or fail to improve.  Visit date not found  Orders Placed This Encounter  Procedures  . D-dimer, quantitative   Meds ordered this encounter  Medications  . triamcinolone acetonide (KENALOG-40) injection 40 mg      I reviewed the patients updated PMH, FH, and SocHx.    Patient Active Problem List   Diagnosis Date Noted  . Seizure disorder (HCC) 08/27/2020  . Dyslipidemia 06/20/2020  . Obesity 06/20/2020  . Stress 06/20/2020  . CAD in native artery 04/20/2019  . Pulmonary embolism (HCC) x2 02/09/2019  . Seizures (HCC) 01/09/2014  . Calculus of gallbladder with acute cholecystitis, without mention of obstruction 07/03/2013   Current Meds  Medication Sig  . metFORMIN (GLUCOPHAGE) 500 MG tablet TAKE 1 TABLET(500 MG) BY MOUTH DAILY WITH BREAKFAST  . PHENobarbital (LUMINAL) 64.8 MG tablet TAKE 1.5 TABLET BY MOUTH EVERY NIGHT AT BEDTIME  . XARELTO 20 MG TABS tablet Take 1 tablet (20 mg total) by mouth daily with  supper.    Allergies: Patient is allergic to tegretol [carbamazepine], keppra [levetiracetam], ketorolac tromethamine, and sulfa antibiotics. Family History: Patient family history includes Depression in her sister; [name] disease in her mother; Heart attack in her brother, father, and paternal grandmother; Heart disease in her brother and father; Kidney disease in her maternal grandfather; Mental illness in her mother; Parkinson's disease in her father. Social History:  Patient  reports that she has never smoked. She has never used smokeless tobacco. She reports that she does not drink alcohol and does not use drugs.  Review of Systems: Constitutional: Negative for fever malaise or  anorexia Cardiovascular: negative for chest pain Respiratory: negative for SOB or persistent cough Gastrointestinal: negative for abdominal pain  Objective  Vitals: BP 132/80   Pulse 77   Temp 98.1 F (36.7 C) (Temporal)   Ht 5' 4.5" (1.638 m)   Wt 200 lb (90.7 kg)   SpO2 97%   BMI 33.80 kg/m  General: no acute distress , A&Ox3 Cardiovascular:  RRR without murmur or gallop.  Respiratory:  Good breath sounds bilaterally, CTAB with normal respiratory effort Extremity: Right greater trochanteric bursa is extremely tender and reproduces pain, hip full range of motion, knee without Swelling or Tenderness, no calf tenderness or cords palpated, negative Homans, normal distal pulses Back: No SI joint tenderness or sciatic notch tenderness, full range of motion Gait: Antalgic  Wells criteria score = -1, low likely DVT  GR Trochanteric Bursa steroid injection  Procedure Note   Pre-operative Diagnosis: right hip bursitis   Post-operative Diagnosis: same   Indications: pain   Anesthesia: cold spray   Procedure Details    Verbal consent was obtained for the procedure. Universal time out done. The point of maximum tenderness was identified and marked over the hip bursa. The skin prepped with alcohol and cold spray used for anesthesia. A needle was advanced into the bursa and the steroid/lido (20mg  kenalog: 1.18ml lidocaine w/o epi) was administered easily.    Complications:  None; patient tolerated the procedure well.  Patient experienced significant improvement after injection.   Commons side effects, risks, benefits, and alternatives for medications and treatment plan prescribed today were discussed, and the patient expressed understanding of the given instructions. Patient is instructed to call or message via MyChart if he/she has any questions or concerns regarding our treatment plan. No barriers to understanding were identified. We discussed Red Flag symptoms and signs in detail.  Patient expressed understanding regarding what to do in case of urgent or emergency type symptoms.   Medication list was reconciled, printed and provided to the patient in AVS. Patient instructions and summary information was reviewed with the patient as documented in the AVS. This note was prepared with assistance of Dragon voice recognition software. Occasional wrong-word or sound-a-like substitutions may have occurred due to the inherent limitations of voice recognition software  This visit occurred during the SARS-CoV-2 public health emergency.  Safety protocols were in place, including screening questions prior to the visit, additional usage of staff PPE, and extensive cleaning of exam room while observing appropriate contact time as indicated for disinfecting solutions.

## 2020-12-24 NOTE — Patient Instructions (Signed)
Please return as needed.  It was a pleasure meeting you today! Thank you for choosing [place] to meet your healthcare needs! I truly look forward to working with you. If you have any questions or concerns, please send me a message via Mychart or call the office at [phone].  I will release your lab results to you on your MyChart account with further instructions. Please reply with any questions.    Ice the hip 2-3x/day for the next several days; hoping the steroid injection will help calm the pain.    You had a steroid injection today.   Things to be aware of after this injection are listed below:  You may experience no significant improvement or even a slight worsening in your symptoms during the first 24 to 48 hours.  After that we expect your symptoms to improve gradually over the next 2 weeks for the medicine to have its maximal effect.  You should continue to have improvement out to 6 weeks after your injection.  I recommend icing the site of the injection for 20 minutes  1-2 times the day of your injection  You may shower but no swimming, tub bath or Jacuzzi for 24 hours.  If your bandage falls off this does not need to be replaced.  It is appropriate to remove the bandage after 4 hours.  You may resume light activities as tolerated.     POSSIBLE PROCEDURE SIDE EFFECTS: The side effects of the injection are usually fairly minimal however if you may experience some of the following side effects that are usually self-limited and will is off on their own.  If you are concerned please feel free to call the office with questions:             Increased numbness or tingling             Nausea or vomiting             Swelling or bruising at the injection site    Please call our office if if you experience any of the following symptoms over the next week as these can be signs of infection:              Fever greater than 100.46F             Significant swelling at the injection site              Significant redness or drainage from the injection site     Hip Bursitis  Hip bursitis is the inflammation of one or more bursae in the hip joint. Bursae are small fluid-filled sacs that absorb shock and prevent bones from rubbing against each other. Hip bursitis can cause mild to moderate pain, and symptoms often come and go over time. What are the causes? This condition results from increased friction between the hip bones and the tendons around the hip joint. This condition can happen if you:  Overuse your hip muscles.  Injure your hip.  Have weak buttocks muscles.  Have bone spurs.  Have an infection. In some cases, the cause may not be known. What increases the risk? You are more likely to develop this condition if:  You injured your hip previously or had hip surgery.  You have a medical condition, such as arthritis, gout, diabetes, or thyroid disease.  You have spine problems.  You have one leg that is shorter than the other.  You participate in athletic activities that include repetitive motion,  like running.  You participate in sports where there is a risk of injury or falling, such as football, martial arts, or skiing. What are the signs or symptoms? Symptoms may come and go, and they often include:  Pain in the hip or groin area. Pain may get worse with movement.  Tenderness and swelling of the hip. In rare cases, the bursa may become infected. If this happens, you may get a fever, as well as warmth and redness in the hip area. How is this diagnosed? This condition may be diagnosed based on:  Your symptoms.  Your medical history.  A physical exam.  Imaging tests, such as: ? X-rays to check your bones. ? MRI or ultrasound to check your tendons and muscles. ? Bone scan.  A biopsy to remove fluid from your inflamed bursa for testing. How is this treated? This condition is treated by resting, icing, applying pressure (compression), and raising  (elevating) the injured area. This is called RICE treatment. In some cases, RICE treatment may not be enough to make your symptoms go away. Treatment may also include:  Taking medicine to help with swelling and pain.  Using crutches, a cane, or a walker to decrease the strain on your hip.  Getting a shot of cortisone medicine to help reduce swelling.  Taking other medicines if the bursa is infected.  Draining fluid out of the bursa to help relieve swelling.  Having surgery to remove a damaged or infected bursa. This is rare. Long-term treatment may include:  Physical therapy exercises for strength and flexibility.  Lifestyle changes, such as weight loss, to reduce the strain on the hip. Follow these instructions at home: Managing pain, stiffness, and swelling  If directed, put ice on the painful area. ? Put ice in a plastic bag. ? Place a towel between your skin and the bag. ? Leave the ice on for 20 minutes, 2-3 times a day.  Raise (elevate) your hip as much as you can without pain. To do this, put a pillow under your hips while you lie down.  If directed, apply heat to the affected area as often as told by your health care provider. Use the heat source that your health care provider recommends, such as a moist heat pack or a heating pad. ? Place a towel between your skin and the heat source. ? Leave the heat on for 20-30 minutes. ? Remove the heat if your skin turns bright red. This is especially important if you are unable to feel pain, heat, or cold. You may have a greater risk of getting burned.      Activity  Do not use your hip to support your body weight until your health care provider says that you can. Use crutches, a cane, or a walker as told by your health care provider.  If the affected leg is one that you use to drive, ask your health care provider if it is safe to drive.  Rest and protect your hip as much as possible until your pain and swelling get  better.  Return to your normal activities as told by your health care provider. Ask your health care provider what activities are safe for you.  Do exercises as told by your health care provider. General instructions  Take over-the-counter and prescription medicines only as told by your health care provider.  Gently massage and stretch your injured area as often as is comfortable.  Wear compression wraps only as told by your health care  provider.  If one of your legs is shorter than the other, get fitted for a shoe insert or orthotic.  Maintain a healthy weight. Follow instructions from your health care provider for weight control. These may include dietary restrictions.  Keep all follow-up visits as told by your health care provider. This is important. How is this prevented?  Exercise regularly, as told by your health care provider.  Wear supportive footwear that is appropriate for your sport.  Warm up and stretch before being active. Cool down and stretch after being active.  Take breaks regularly from repetitive activity.  If an activity irritates your hip or causes pain, avoid the activity as much as possible.  Avoid sitting down for long periods at a time. Where to find more information  American Academy [employer]: orthoinfo.aaos.org Contact a health care provider if:  You have a fever.  You develop new symptoms.  You have trouble walking or doing everyday activities.  You have pain that gets worse or does not get better with medicine.  You develop red skin or a feeling of warmth in your hip area. Get help right away if:  You cannot move your hip.  You have severe pain.  You cannot control the muscles in your feet. Summary  Hip bursitis is the inflammation of one or more bursae in the hip joint. Bursae are small fluid-filled sacs that absorb shock and prevent bones from rubbing against each other.  Hip bursitis can cause hip or groin pain,  and symptoms often come and go over time.  This condition is often treated by resting, icing, applying pressure (compression), and raising (elevating) the injured area. Other treatments may be needed. This information is not intended to replace advice given to you by your health care provider. Make sure you discuss any questions you have with your health care provider. Document Revised: 05/28/2019 Document Reviewed: 04/03/2018 Elsevier Patient Education  2021 [employer].

## 2020-12-25 ENCOUNTER — Encounter: Payer: Self-pay | Admitting: Family Medicine

## 2020-12-25 ENCOUNTER — Telehealth: Payer: Self-pay

## 2020-12-25 LAB — D-DIMER, QUANTITATIVE: D-Dimer, Quant: 0.2 mcg/mL FEU (ref <0.50)

## 2020-12-25 NOTE — Telephone Encounter (Signed)
Pt is requesting to transfer care from Dr. Jimmey Ralph to Dr. Mardelle Matte. Ok to transfer? Please advise.

## 2020-12-25 NOTE — Telephone Encounter (Signed)
Patient is scheduled   

## 2020-12-25 NOTE — Telephone Encounter (Signed)
Ok with me!  Sarah Chung. Jimmey Ralph, MD 12/25/2020 8:11 AM

## 2020-12-25 NOTE — Telephone Encounter (Signed)
That is fine with me as well. Thanks.

## 2021-01-01 ENCOUNTER — Telehealth (INDEPENDENT_AMBULATORY_CARE_PROVIDER_SITE_OTHER): Payer: Self-pay | Admitting: Family Medicine

## 2021-01-01 ENCOUNTER — Encounter: Payer: Self-pay | Admitting: Family Medicine

## 2021-01-01 VITALS — Wt 198.0 lb

## 2021-01-01 DIAGNOSIS — U071 COVID-19: Secondary | ICD-10-CM

## 2021-01-01 DIAGNOSIS — R030 Elevated blood-pressure reading, without diagnosis of hypertension: Secondary | ICD-10-CM

## 2021-01-01 NOTE — Patient Instructions (Addendum)
  HOME CARE TIPS:   -can use nasal saline a few times per day if you have nasal congestion  -stay hydrated, drink plenty of fluids and eat small healthy meals - avoid dairy  -can take 1000 IU ( ) Vit D3 and 100-500 mg of Vit C daily per instructions  -follow up with your doctor in 2-3 days unless improving and feeling better  -stay home while sick, except to seek medical care. If you have COVID19, ideally it would be best to stay home for a full 10 days since the onset of symptoms PLUS one day of no fever and feeling better. Wear a good mask that fits snugly (such as N95 or KN95) if around others to reduce the risk of transmission.  FOR the ELEVATED BLOOD PRESSURE: -monitor blood pressure 3 days per week as we discussed. Goal is 120/70. Follow up with your doctor in 2 weeks to review the log and recheck. Follow up sooner right away if any chest pain, breathing issues, headaches that do not resolve or are severe, swelling, high blood pressure readings over 170/90 or other concerns.   It was nice to meet you today, and I really hope you are feeling better soon. I help [facility] out with telemedicine visits on Tuesdays and Thursdays and am available for visits on those days. If you have any concerns or questions following this visit please schedule a follow up visit with your Primary Care doctor or seek care at a local urgent care clinic to avoid delays in care.    Seek in person care or schedule a follow up video visit promptly if your symptoms worsen, new concerns arise or you are not improving with treatment. Call 911 and/or seek emergency care if your symptoms are severe or life threatening.

## 2021-01-01 NOTE — Progress Notes (Addendum)
Virtual Visit via Video Note  I connected with Sarah Chung  on 01/01/21 at  1:20 PM EDT by a video enabled telemedicine application and verified that I am speaking with the correct person using two identifiers.  Location patient: home, [place] Location provider:work or home office Persons participating in the virtual visit: patient, provider  I discussed the limitations of evaluation and management by telemedicine and the availability of in person appointments. The patient expressed understanding and agreed to proceed.   HPI:  Acute telemedicine visit for COVID19: -Onset: ~ 8-9 days ago -Symptoms include: cough, headache, body aches, nasal congestion, tickle in throat, low grade fever initially -Denies: CP, SOB, worst headache or sore headache, NVD, inability to ear/drink/get out of bed -Pertinent past medical history: see below -Pertinent medication allergies: Allergies  Allergen Reactions  . Tegretol [Carbamazepine] Hives  . Keppra [Levetiracetam]     Feels outside her body.[name]  . Ketorolac Tromethamine   . Sulfa Antibiotics Hives  -COVID-19 vaccine status: 2 doses + booster -no recent labs  ROS: See pertinent positives and negatives per HPI.  Past Medical History:  Diagnosis Date  . Allergy   . Clotting disorder (HCC)   . DVT (deep vein thrombosis) in pregnancy   . Gallstones   . Hyperlipidemia   . Pulmonary embolism (HCC)   . Seizures (HCC)     Past Surgical History:  Procedure Laterality Date  . CHOLECYSTECTOMY N/A 07/02/2013   Procedure: LAPAROSCOPIC CHOLECYSTECTOMY WITH INTRAOPERATIVE CHOLANGIOGRAM;  Surgeon: Ernestene Mention, MD;  Location: [facility];  Service: General;  Laterality: N/A;  . LEFT HEART CATH AND CORONARY ANGIOGRAPHY N/A 04/20/2019   Procedure: LEFT HEART CATH AND CORONARY ANGIOGRAPHY;  Surgeon: Lyn Records, MD;  Location: [facility];  Service: Cardiovascular;  Laterality: N/A;     Current Outpatient Medications:  .  metFORMIN (GLUCOPHAGE) 500  MG tablet, TAKE 1 TABLET(500 MG) BY MOUTH DAILY WITH BREAKFAST, Disp: 90 tablet, Rfl: 0 .  PHENobarbital (LUMINAL) 64.8 MG tablet, TAKE 1.5 TABLET BY MOUTH EVERY NIGHT AT BEDTIME, Disp: 135 tablet, Rfl: 5 .  XARELTO 20 MG TABS tablet, Take 1 tablet (20 mg total) by mouth daily with supper., Disp: 30 tablet, Rfl: 12 .  atorvastatin (LIPITOR) 80 MG tablet, Take 1 tablet (80 mg total) by mouth daily., Disp: 90 tablet, Rfl: 3 .  ezetimibe (ZETIA) 10 MG tablet, Take 1 tablet (10 mg total) by mouth daily., Disp: 30 tablet, Rfl: 11  EXAM:  VITALS per patient if applicable: 140/83, P 67, T 98  GENERAL: alert, oriented, appears well and in no acute distress  HEENT: atraumatic, conjunttiva clear, no obvious abnormalities on inspection of external nose and ears  NECK: normal movements of the head and neck  LUNGS: on inspection no signs of respiratory distress, breathing rate appears normal, no obvious gross SOB, gasping or wheezing  CV: no obvious cyanosis  MS: moves all visible extremities without noticeable abnormality  PSYCH/NEURO: pleasant and cooperative, no obvious depression or anxiety, speech and thought processing grossly intact  ASSESSMENT AND PLAN:  Discussed the following assessment and plan:  COVID-19  Elevated BP without diagnosis of hypertension  -we discussed possible serious and likely etiologies, options for evaluation and workup, limitations of telemedicine visit vs in person visit, treatment, treatment risks and precautions. Pt prefers to treat via telemedicine empirically rather than in person at this moment.  Discussed treatment options, ideal treatment window, potential complications, isolation and precautions for COVID-19. She is fully vaccinated and boosted,  had mild symptoms and seems to be improving and is on day 8-9 of symptoms. The patient opted against Covid outpatient treatment at this time. Plans to use topical menthol for the headache as reports was told can not  use tylenol due to her phenobarbital and nsaid interactions with blood thinner and on htn. Other symptomatic care measures summarized in patient instructions. For the elevated BP, she feels this is related to being nervous when she took her blood pressure. We discussed BP goals, proper way to check and monitor BP at home. Advised to check 3 times per week, keep log and follow up with PCP in 2 weeks with the log to recheck.  Work/School slipped offered: declined Scheduled follow up with PCP offered: Declined, she agrees to schedule follow up if BP not running at goal or as needed otherwise. Advised to seek prompt in person care if worsening, new symptoms arise, or if is not improving with treatment. Discussed options for inperson care if PCP office not available. Did let this patient know that I only do telemedicine on Tuesdays and Thursdays for [place]. Advised to schedule follow up visit with PCP or [facility] if any further questions or concerns to avoid delays in care.   I discussed the assessment and treatment plan with the patient. The patient was provided an opportunity to ask questions and all were answered. The patient agreed with the plan and demonstrated an understanding of the instructions.     Terressa Koyanagi, DO

## 2021-03-11 ENCOUNTER — Encounter (HOSPITAL_COMMUNITY): Payer: Self-pay

## 2021-03-13 ENCOUNTER — Other Ambulatory Visit (HOSPITAL_COMMUNITY): Payer: Self-pay | Admitting: Hematology

## 2021-03-13 ENCOUNTER — Other Ambulatory Visit (HOSPITAL_COMMUNITY): Payer: Self-pay

## 2021-03-13 DIAGNOSIS — I2692 Saddle embolus of pulmonary artery without acute cor pulmonale: Secondary | ICD-10-CM

## 2021-03-16 ENCOUNTER — Inpatient Hospital Stay (HOSPITAL_COMMUNITY): Payer: Self-pay | Attending: Hematology

## 2021-03-16 ENCOUNTER — Other Ambulatory Visit: Payer: Self-pay

## 2021-03-16 DIAGNOSIS — Z841 Family history of disorders of kidney and ureter: Secondary | ICD-10-CM | POA: Insufficient documentation

## 2021-03-16 DIAGNOSIS — Z8349 Family history of other endocrine, nutritional and metabolic diseases: Secondary | ICD-10-CM | POA: Insufficient documentation

## 2021-03-16 DIAGNOSIS — E669 Obesity, unspecified: Secondary | ICD-10-CM | POA: Insufficient documentation

## 2021-03-16 DIAGNOSIS — R252 Cramp and spasm: Secondary | ICD-10-CM | POA: Insufficient documentation

## 2021-03-16 DIAGNOSIS — Z882 Allergy status to sulfonamides status: Secondary | ICD-10-CM | POA: Insufficient documentation

## 2021-03-16 DIAGNOSIS — I82431 Acute embolism and thrombosis of right popliteal vein: Secondary | ICD-10-CM | POA: Insufficient documentation

## 2021-03-16 DIAGNOSIS — I2692 Saddle embolus of pulmonary artery without acute cor pulmonale: Secondary | ICD-10-CM

## 2021-03-16 DIAGNOSIS — Z86711 Personal history of pulmonary embolism: Secondary | ICD-10-CM | POA: Insufficient documentation

## 2021-03-16 DIAGNOSIS — E785 Hyperlipidemia, unspecified: Secondary | ICD-10-CM | POA: Insufficient documentation

## 2021-03-16 DIAGNOSIS — Z8249 Family history of ischemic heart disease and other diseases of the circulatory system: Secondary | ICD-10-CM | POA: Insufficient documentation

## 2021-03-16 DIAGNOSIS — Z79899 Other long term (current) drug therapy: Secondary | ICD-10-CM | POA: Insufficient documentation

## 2021-03-16 DIAGNOSIS — Z7901 Long term (current) use of anticoagulants: Secondary | ICD-10-CM | POA: Insufficient documentation

## 2021-03-16 DIAGNOSIS — Z818 Family history of other mental and behavioral disorders: Secondary | ICD-10-CM | POA: Insufficient documentation

## 2021-03-16 DIAGNOSIS — Z9049 Acquired absence of other specified parts of digestive tract: Secondary | ICD-10-CM | POA: Insufficient documentation

## 2021-03-16 DIAGNOSIS — Z86718 Personal history of other venous thrombosis and embolism: Secondary | ICD-10-CM | POA: Insufficient documentation

## 2021-03-16 LAB — D-DIMER, QUANTITATIVE: D-Dimer, Quant: <0.27 ug/mL-FEU (ref 0.00–0.50)

## 2021-03-19 IMAGING — CR CHEST - 2 VIEW
2 series · 2 of 2 positions shown · non-contrast
Comparison: CT 02/08/2019

CLINICAL DATA: Chest pain and shortness of breath. Recent diagnosis
of pulmonary embolus.

EXAM:
CHEST - 2 VIEW

[chest pa]
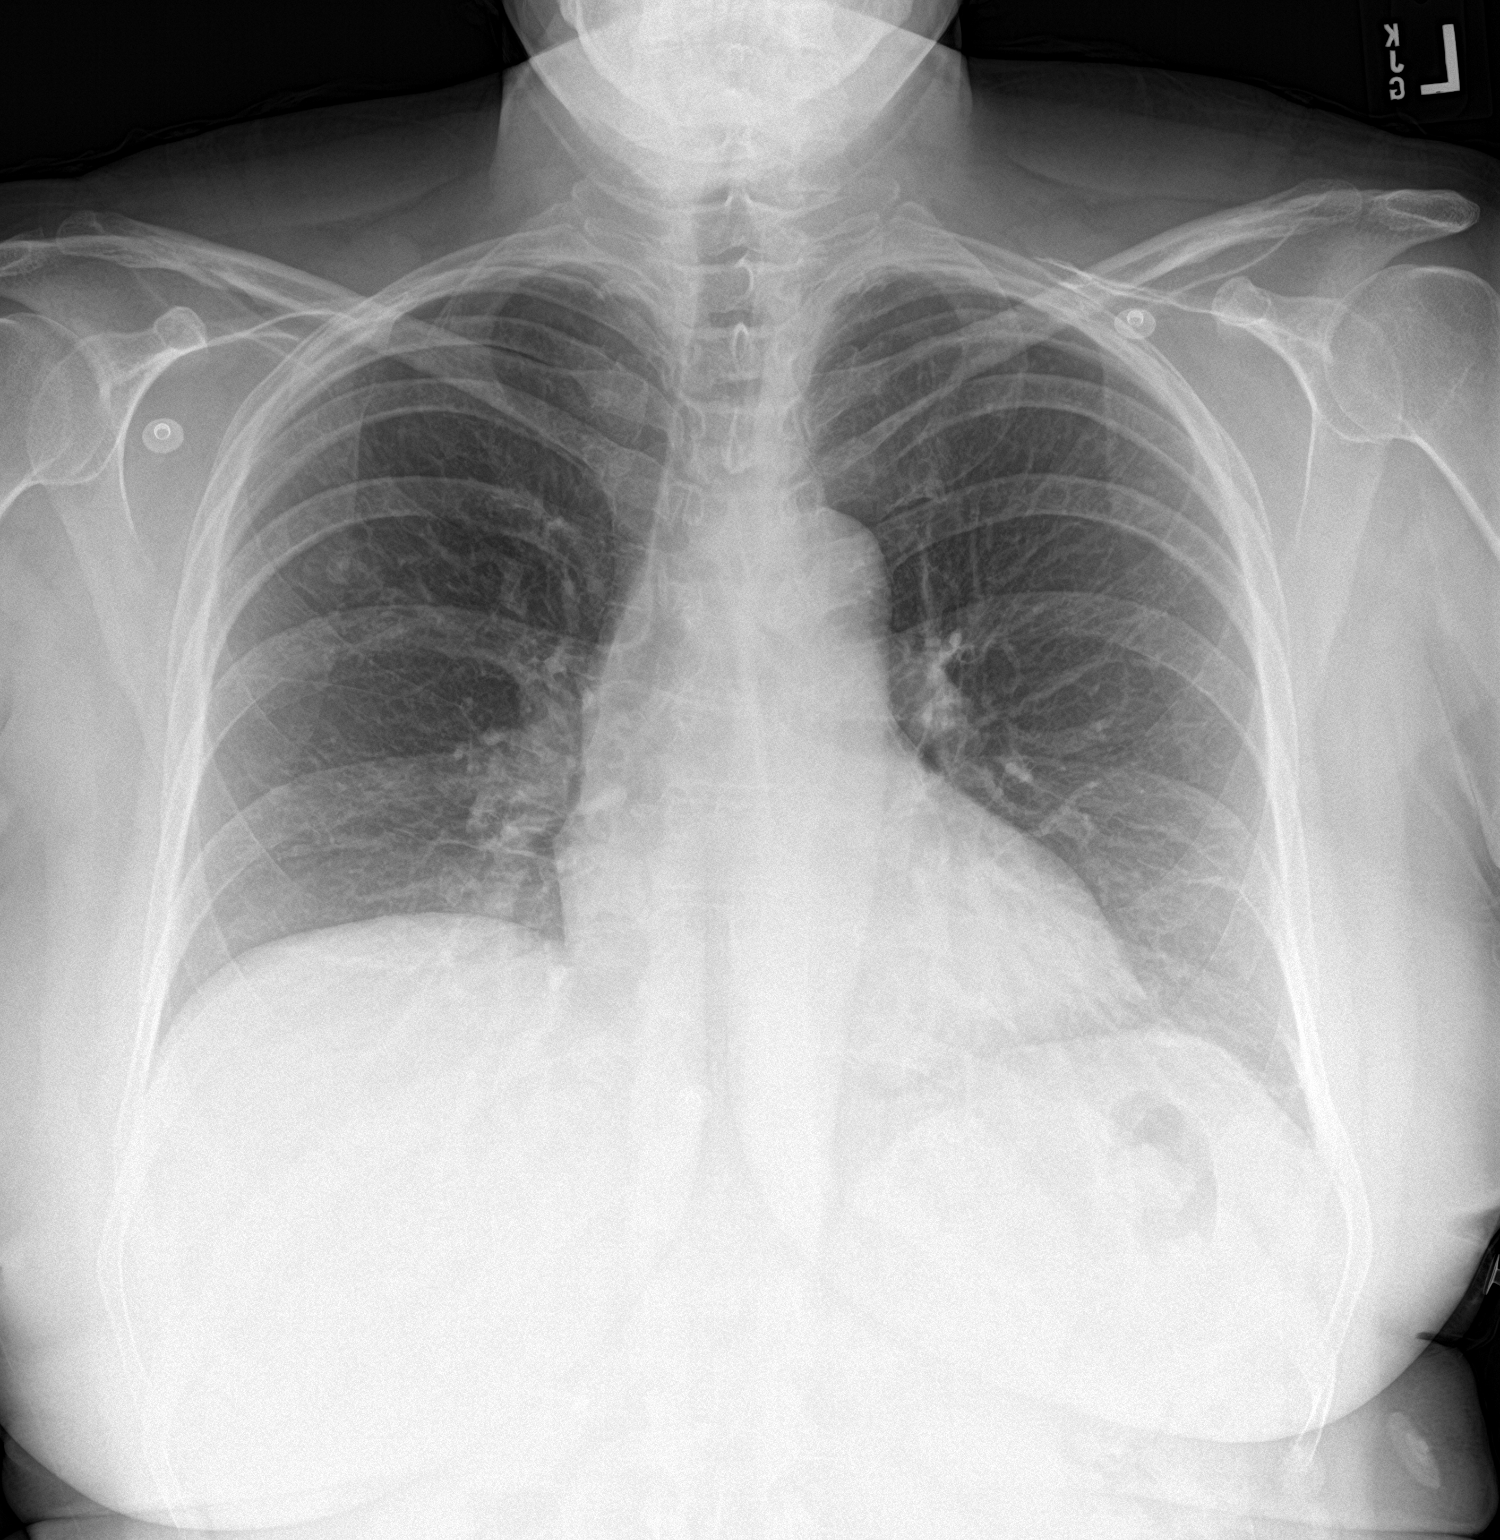

[chest lat]
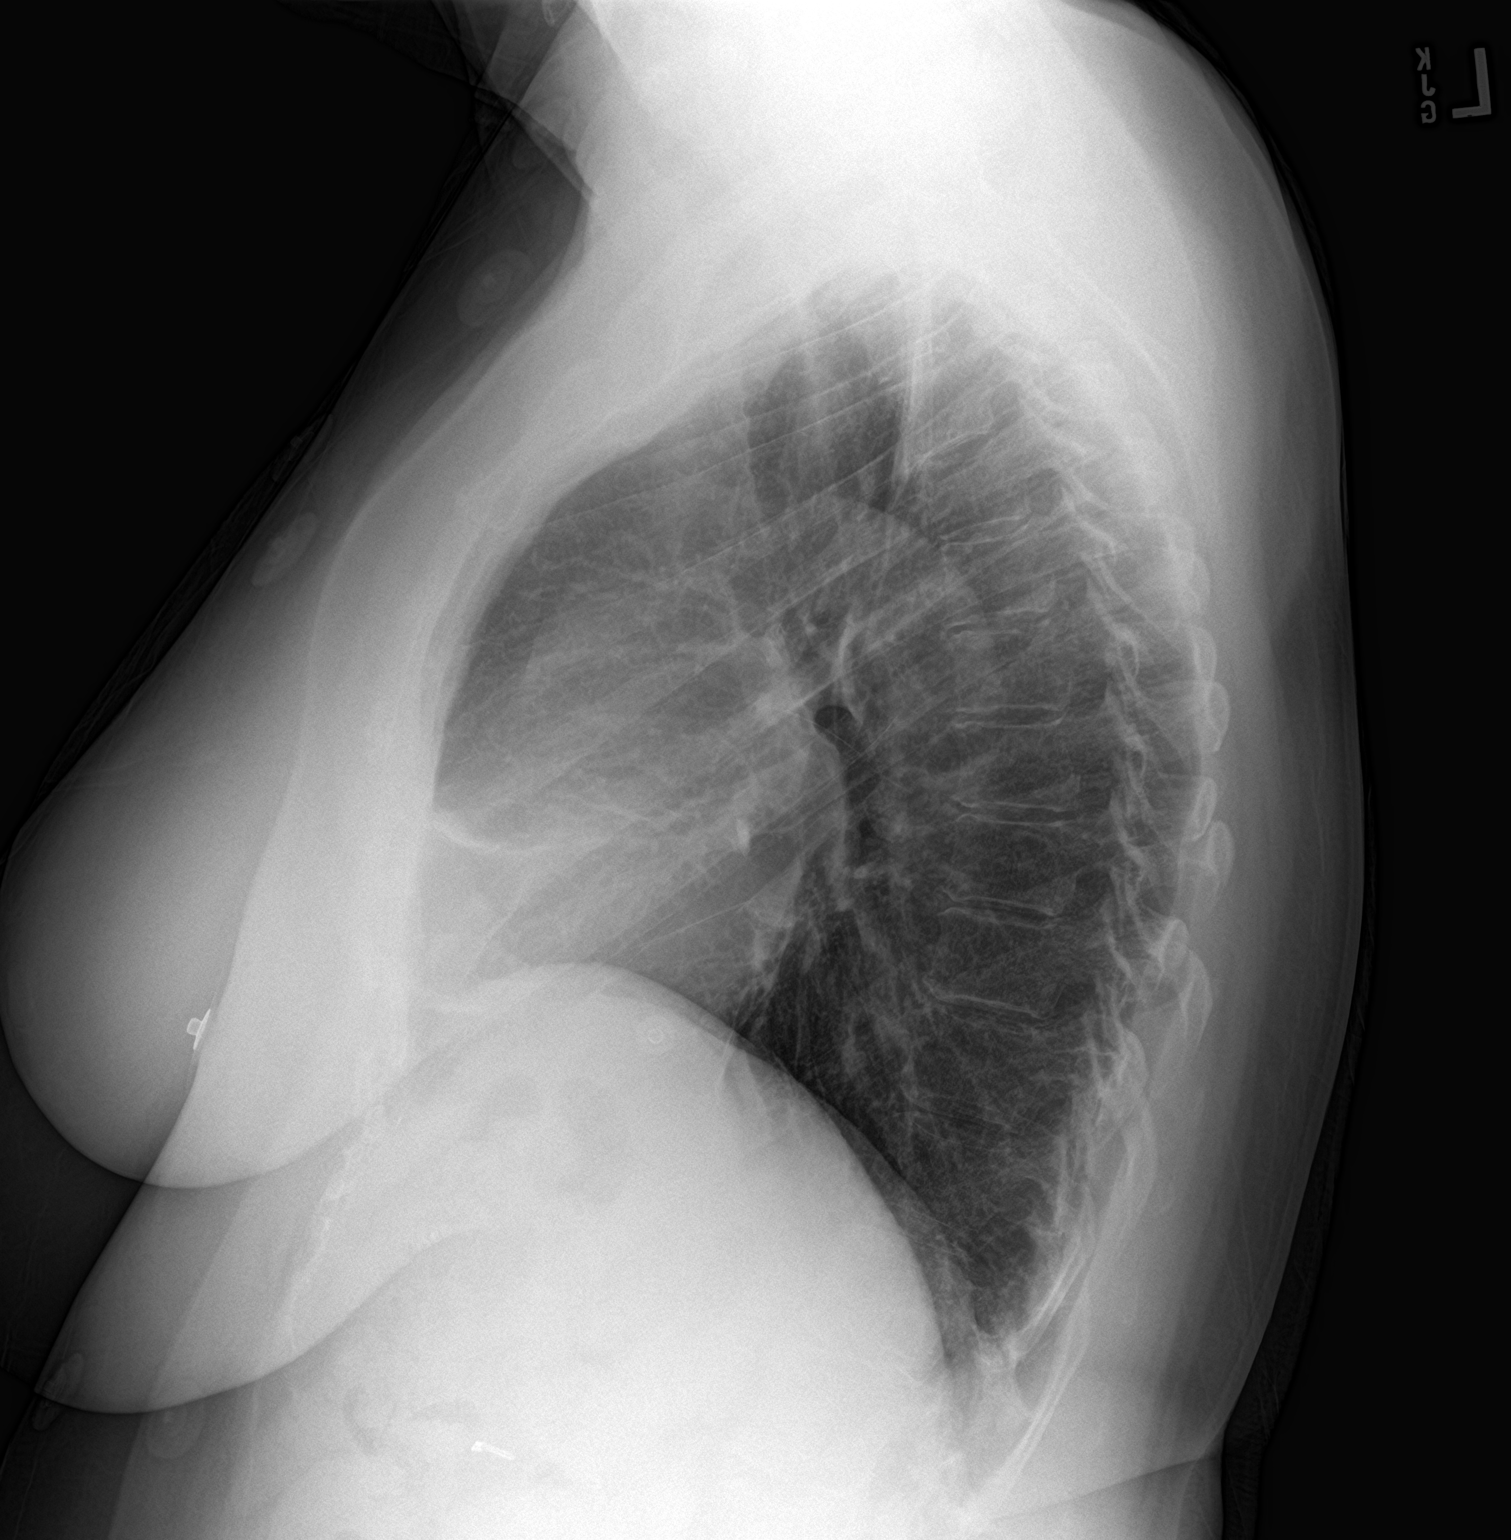

[2 of 2 positions shown; findings below may reference images not displayed]

FINDINGS: The cardiomediastinal contours are normal. Minor lingular
scarring/atelectasis. Pulmonary vasculature is normal. No
consolidation, pleural effusion, or pneumothorax. No acute osseous
abnormalities are seen.
IMPRESSION: No acute chest findings.

## 2021-03-19 IMAGING — CT CT ANGIOGRAPHY CHEST
4 of 7 series · 18 of 46 positions shown · IV contrast (APPLIED)
Comparison: Chest radiograph earlier this day. Chest CT 02/08/2019

CLINICAL DATA: PE suspected, high pretest prob. Diagnosis of
pulmonary embolus 6 weeks ago.

EXAM:
CT ANGIOGRAPHY CHEST WITH CONTRAST
TECHNIQUE: Multidetector CT imaging of the chest was performed using the
standard protocol during bolus administration of intravenous
contrast. Multiplanar CT image reconstructions and MIPs were
obtained to evaluate the vascular anatomy.
CONTRAST:  100mL OMNIPAQUE IOHEXOL 350 MG/ML SOLN

[Series 5: arterial · axial · arterial · 0.74mm/px · z∈[+914,+1092]mm · 5 of 135 slices shown]
[im 23/135  lung]
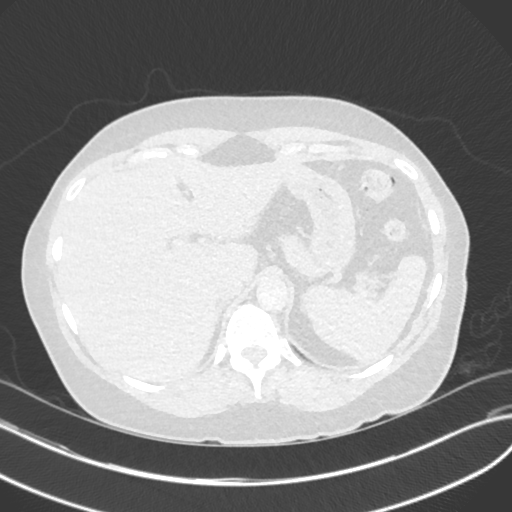
[im 45/135  soft-tissue]
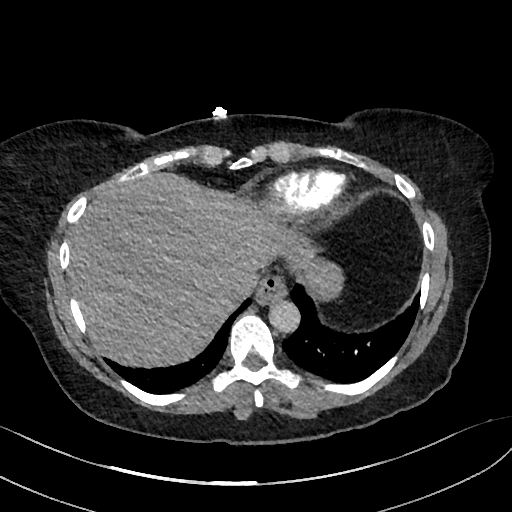
[im 68/135  lung]
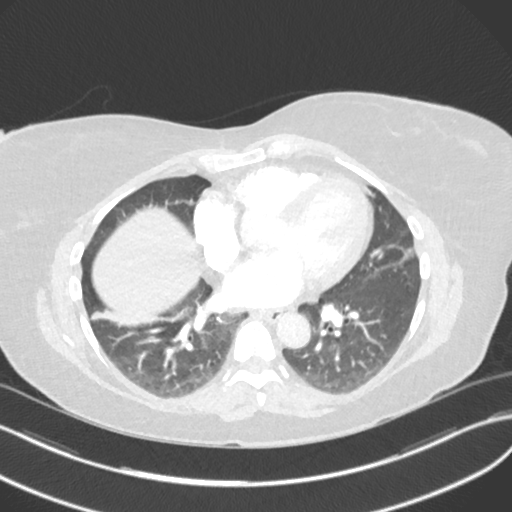
[im 90/135  soft-tissue]
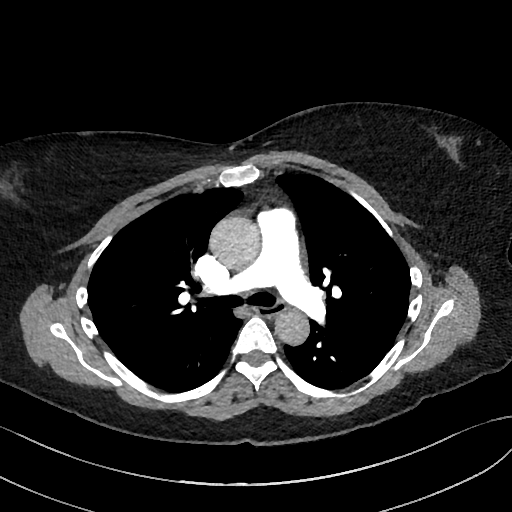
[im 112/135  lung]
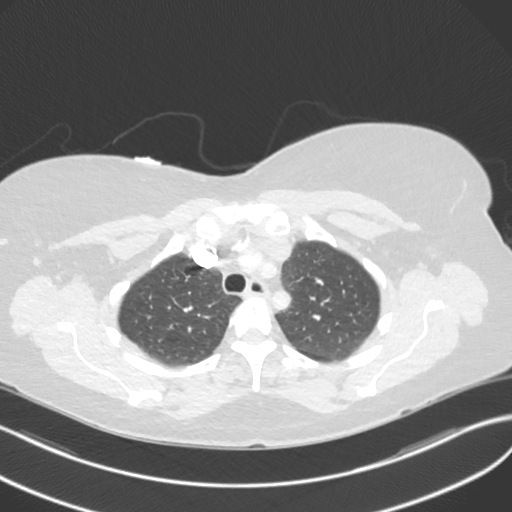

[Series 6: lung · axial · 0.74mm/px · z∈[+938,+988]mm · 2 of 125 slices shown]
[im 25/125  soft-tissue]
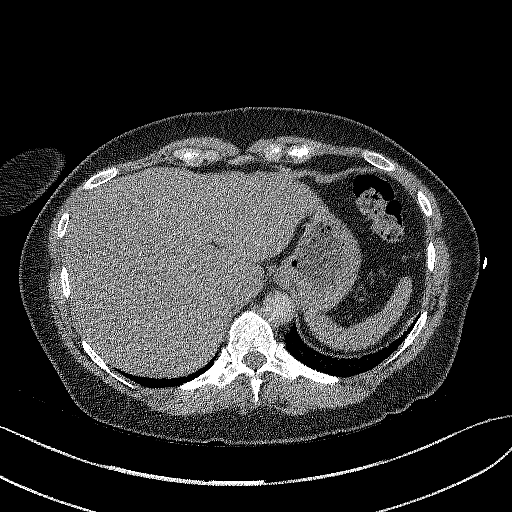
[im 50/125  soft-tissue]
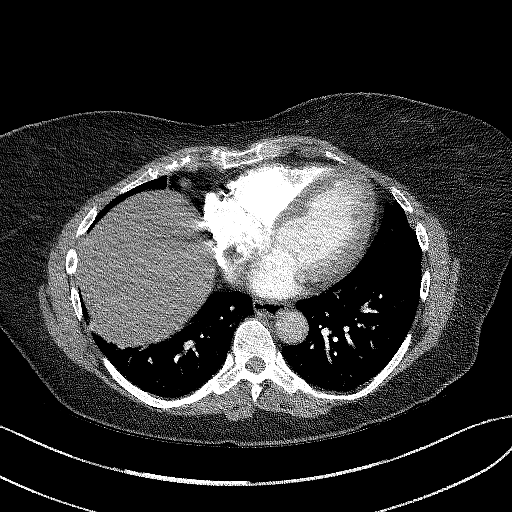

[Series 7: thins · axial · 0.74mm/px · z∈[+916,+1110]mm · 8 of 357 slices shown]
[im 40/357  lung]
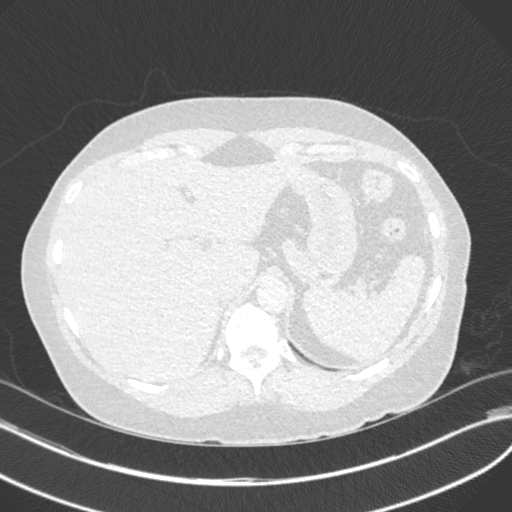
[im 80/357  lung]
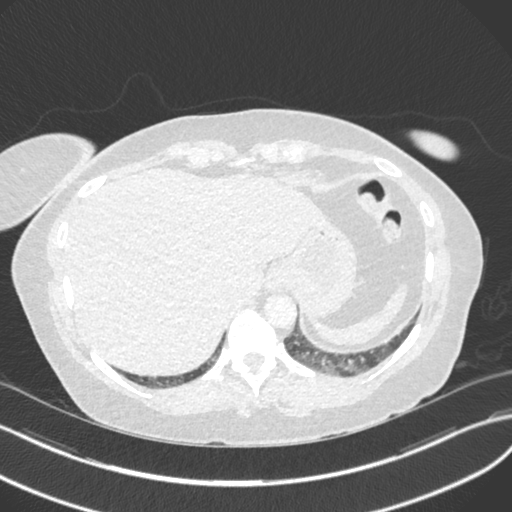
[im 119/357  lung]
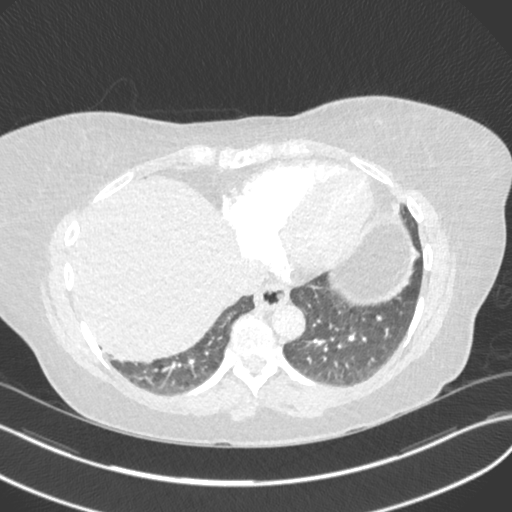
[im 159/357  lung]
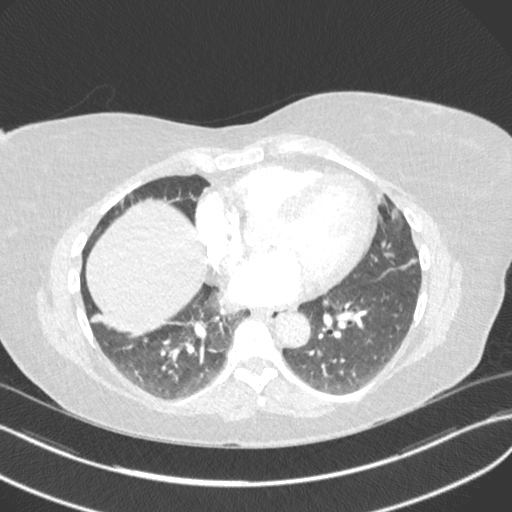
[im 198/357  lung]
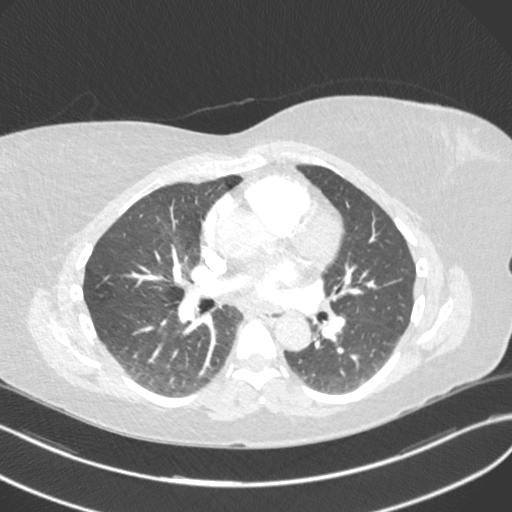
[im 238/357  lung]
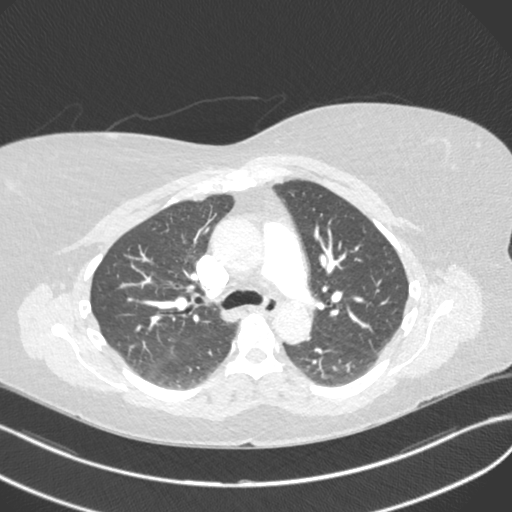
[im 277/357  lung]
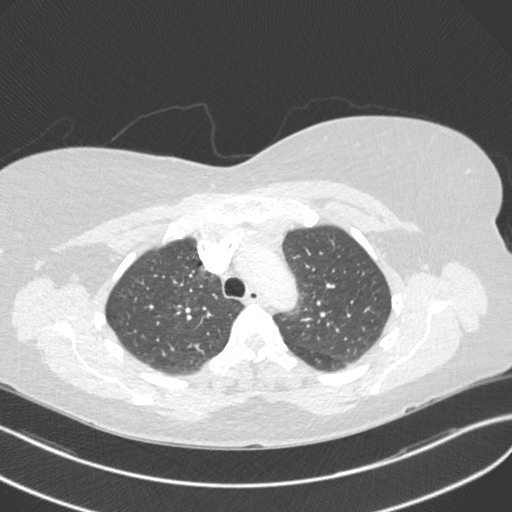
[im 317/357  lung]
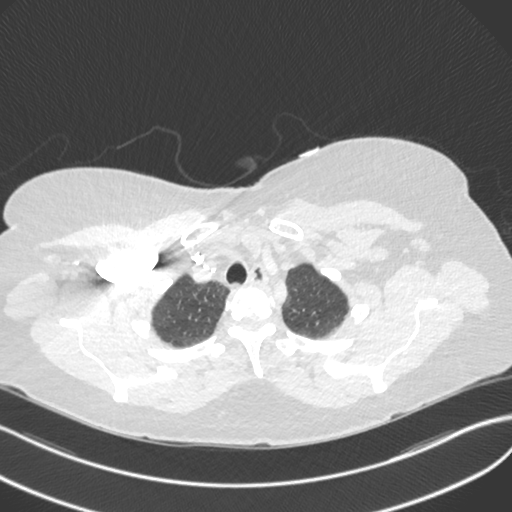

[Series 8: cor · coronal · 0.53mm/px · 3 of 142 slices shown]
[im 36/142  soft-tissue]
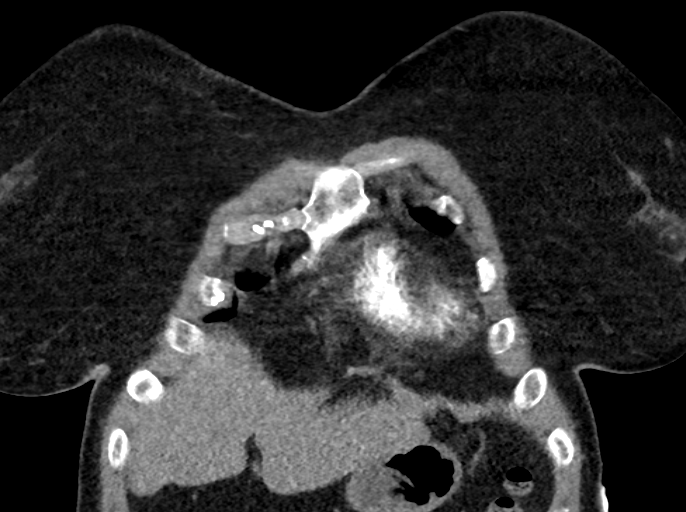
[im 71/142  soft-tissue]
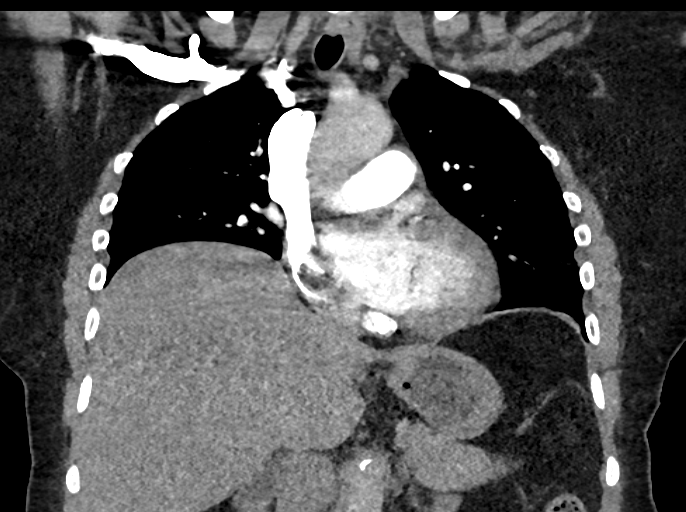
[im 106/142  soft-tissue]
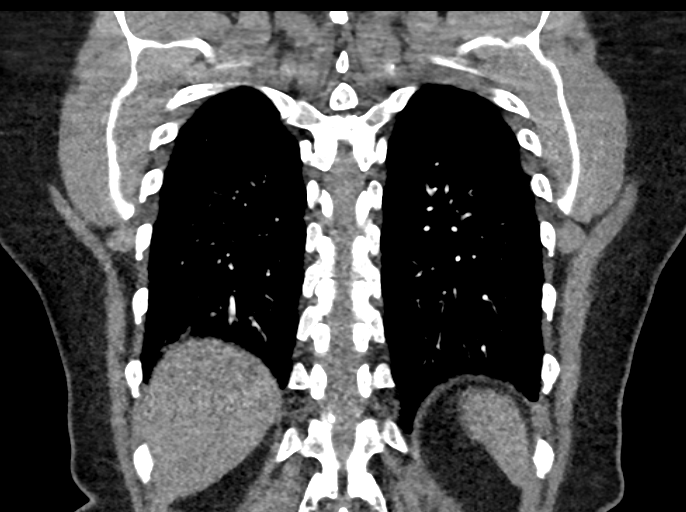

[18 of 46 positions shown; findings below may reference images not displayed]

FINDINGS: Cardiovascular: Improved thromboembolic burden compared to prior CT,
small filling defect persists in the left lower lobar and segmental
pulmonary artery. The remainder of the pulmonary arterial filling
defects have resolved. There is no right heart strain. RV to LV
ratio is less than 1. Heart is normal in size. Thoracic aorta is
normal in caliber, cannot assess for dissection given phase of IV
contrast. Mild atherosclerosis.

Mediastinum/Nodes: No enlarged mediastinal or hilar lymph nodes.
Tiny hiatal hernia. No visualized thyroid nodule.

Lungs/Pleura: Linear atelectasis in both lower lobes. No confluent
airspace disease or pulmonary infarct. No pleural fluid. No
pulmonary edema.

Upper Abdomen: No acute abnormality. Mild chronic elevation of right
hemidiaphragm.

Musculoskeletal: There are no acute or suspicious osseous
abnormalities.

Review of the MIP images confirms the above findings.
IMPRESSION: 1. Significant improvement in bilateral pulmonary emboli since prior
exam. Small volume residual thrombus in the left lower lobar and
segmental pulmonary arteries. The remainder of the pulmonary
arterial filling defects have resolved.
2. Mild bilateral lower lobe atelectasis.

Aortic Atherosclerosis ([id number]-[id number]).

## 2021-03-20 NOTE — Progress Notes (Signed)
[facility] [address], [place] [address]   CLINIC:  Medical Oncology/Hematology  PCP:  Willow Ora, MD [address] [place] [address] [phone]   REASON FOR VISIT:  Follow-up for unprovoked DVT and PE  PRIOR THERAPY: None  CURRENT THERAPY: Xarelto  INTERVAL HISTORY:  Ms. Lufkin 60 y.o. 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  She was last seen by Dr. Ellin Saba on 03/12/2020.  At today's visit, she reports feeling well.  No recent hospitalizations, surgeries, or changes in baseline health status.  She continues to take Xarelto, and reports that she rarely misses any doses.  She denies easy bruising.  She has not had any bleeding events on Xarelto, no epistaxis, hematemesis, hematochezia, melena. She denies any leg swelling, pain, or erythema.  No shortness of breath, dyspnea on exertion, chest pain, cough, hemoptysis, or palpitations. She does have ongoing bilateral leg cramping, which she believes is unrelated and which she attributes to her statin medication.  She has 100% energy and 100% appetite. She endorses that she is maintaining a stable weight.    REVIEW OF SYSTEMS:  Review of Systems  Constitutional:  Negative for appetite change, chills, diaphoresis, fatigue, fever and unexpected weight change.  HENT:   Negative for lump/mass and nosebleeds.   Eyes:  Negative for eye problems.  Respiratory:  Negative for cough, hemoptysis and shortness of breath.   Cardiovascular:  Negative for chest pain, leg swelling and palpitations.  Gastrointestinal:  Negative for abdominal pain, blood in stool, constipation, diarrhea, nausea and vomiting.  Genitourinary:  Negative for hematuria.   Skin: Negative.   Neurological:  Negative for dizziness, headaches and light-headedness.  Hematological:  Does not bruise/bleed easily.     PAST MEDICAL/SURGICAL HISTORY:  Past Medical History:   Diagnosis Date   Allergy    Clotting disorder (HCC)    DVT (deep vein thrombosis) in pregnancy    Gallstones    Hyperlipidemia    Pulmonary embolism (HCC)    Seizures (HCC)    Past Surgical History:  Procedure Laterality Date   CHOLECYSTECTOMY N/A 07/02/2013   Procedure: LAPAROSCOPIC CHOLECYSTECTOMY WITH INTRAOPERATIVE CHOLANGIOGRAM;  Surgeon: Ernestene Mention, MD;  Location: [facility];  Service: General;  Laterality: N/A;   LEFT HEART CATH AND CORONARY ANGIOGRAPHY N/A 04/20/2019   Procedure: LEFT HEART CATH AND CORONARY ANGIOGRAPHY;  Surgeon: Lyn Records, MD;  Location: [facility];  Service: Cardiovascular;  Laterality: N/A;     SOCIAL HISTORY:  Social History   Socioeconomic History   Marital status: Married    Spouse name: [name]   Number of children: 2   Years of education: College   Highest education level: Not on file  Occupational History   Occupation: Musician: [employer]  Tobacco Use   Smoking status: Never   Smokeless tobacco: Never  Vaping Use   Vaping Use: Never used  Substance and Sexual Activity   Alcohol use: No   Drug use: No   Sexual activity: Yes    Birth control/protection: Post-menopausal  Other Topics Concern   Not on file  Social History Narrative   Patient is married ([name]) and lives at home with his husband.   Patient has two adult children.   Patient is working full-time.   Patient has a college education.   Patient is right-handed.   Patient drinks two cups of coffee and some soda.  Social Determinants of Health   Financial Resource Strain: Not on file  Food Insecurity: Not on file  Transportation Needs: Not on file  Physical Activity: Not on file  Stress: Not on file  Social Connections: Not on file  Intimate Partner Violence: Not on file    FAMILY HISTORY:  Family History  Problem Relation Age of Onset   Heart attack Father    Parkinson's disease Father    Heart disease Father    Heart  disease Brother    Heart attack Brother    Mental illness Mother    [name] disease Mother    Depression Sister    Kidney disease Maternal Grandfather    Heart attack Paternal Grandmother     CURRENT MEDICATIONS:  Outpatient Encounter Medications as of 03/23/2021  Medication Sig   atorvastatin (LIPITOR) 80 MG tablet Take 1 tablet (80 mg total) by mouth daily.   ezetimibe (ZETIA) 10 MG tablet Take 1 tablet (10 mg total) by mouth daily.   metFORMIN (GLUCOPHAGE) 500 MG tablet TAKE 1 TABLET(500 MG) BY MOUTH DAILY WITH BREAKFAST   PHENobarbital (LUMINAL) 64.8 MG tablet TAKE 1.5 TABLET BY MOUTH EVERY NIGHT AT BEDTIME   XARELTO 20 MG TABS tablet TAKE 1 TABLET(20 MG) BY MOUTH DAILY WITH SUPPER   No facility-administered encounter medications on file as of 03/23/2021.    ALLERGIES:  Allergies  Allergen Reactions   Tegretol [Carbamazepine] Hives   Keppra [Levetiracetam]     Feels outside her body.[name]   Ketorolac Tromethamine    Sulfa Antibiotics Hives     PHYSICAL EXAM:  ECOG PERFORMANCE STATUS: 0 - Asymptomatic  There were no vitals filed for this visit. There were no vitals filed for this visit. Physical Exam Constitutional:      Appearance: Normal appearance. She is obese.  HENT:     Head: Normocephalic and atraumatic.     Mouth/Throat:     Mouth: Mucous membranes are moist.  Eyes:     Extraocular Movements: Extraocular movements intact.     Pupils: Pupils are equal, round, and reactive to light.  Cardiovascular:     Rate and Rhythm: Normal rate and regular rhythm.     Pulses: Normal pulses.     Heart sounds: Normal heart sounds.  Pulmonary:     Effort: Pulmonary effort is normal.     Breath sounds: Normal breath sounds.  Abdominal:     General: Bowel sounds are normal.     Palpations: Abdomen is soft.     Tenderness: There is no abdominal tenderness.  Musculoskeletal:        General: No swelling.     Right lower leg: No edema.     Left lower leg: No edema.   Lymphadenopathy:     Cervical: No cervical adenopathy.  Skin:    General: Skin is warm and dry.  Neurological:     General: No focal deficit present.     Mental Status: She is alert and oriented to person, place, and time.  Psychiatric:        Mood and Affect: Mood normal.        Behavior: Behavior normal.     LABORATORY DATA:  I have reviewed the labs as listed.  CBC    Component Value Date/Time   WBC 8.7 06/24/2020 1353   RBC 4.45 06/24/2020 1353   HGB 13.8 06/24/2020 1353   HCT 40.5 06/24/2020 1353   PLT 297 06/24/2020 1353   MCV 91.0 06/24/2020 1353  MCH 31.0 06/24/2020 1353   MCHC 34.1 06/24/2020 1353   RDW 12.2 06/24/2020 1353   LYMPHSABS 2.3 04/17/2019 1021   MONOABS 0.5 04/17/2019 1021   EOSABS 0.1 04/17/2019 1021   BASOSABS 0.0 04/17/2019 1021   CMP Latest Ref Rng & Units 06/24/2020 08/16/2019 04/17/2019  Glucose 65 - 99 mg/dL 71 95 88  BUN 7 - 25 mg/dL 19 16(X28(H) 19  Creatinine 0.50 - 0.99 mg/dL 0.960.96 0.450.98 4.091.00  Sodium 135 - 146 mmol/L 139 142 139  Potassium 3.5 - 5.3 mmol/L 4.4 3.9 4.8  Chloride 98 - 110 mmol/L 101 106 102  CO2 20 - 32 mmol/L 26 25 28   Calcium 8.6 - 10.4 mg/dL 9.8 9.3 9.1  Total Protein 6.1 - 8.1 g/dL 7.9 - -  Total Bilirubin 0.2 - 1.2 mg/dL 0.5 - -  Alkaline Phos 39 - 117 IU/L - - -  AST 10 - 35 U/L 21 - -  ALT 6 - 29 U/L 22 - -    DIAGNOSTIC IMAGING:  I have independently reviewed the relevant imaging and discussed with the patient.  ASSESSMENT & PLAN: 1.  Unprovoked DVT and PE: - Presentation to the ER on 02/08/2019 with right leg pain and shortness of breath on exertion, started 2 to 3 days ago. - Ultrasound of the lower extremities on 02/08/2019 was positive for acute occlusive DVT in the right popliteal and calf veins. - CT of the chest PE protocol on 02/08/2019 shows bulky bilateral clot burden and (left eccentric) saddle embolus.  CT evidence of heart strain. - She [facility] 02/08/2019 [facility], started  on anticoagulation, transitioned to Xarelto. - She had a history of left leg DVT 1993, few days after a C-section.  She was on limited duration Coumadin at that time. - She did not have any instigating factors prior to the latest DVT and PE. - 2D echo on 02/09/2019 shows ejection fraction of 60 to 65% with normal right ventricular cavity. - At time of diagnosis, Dr. 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  Based on her recent DVT and pulmonary embolism with clot burden, he recommended indefinite anticoagulation.  No indication for further hypercoagulable testing, as it would not affect the duration of anticoagulation. - She did not have any history of miscarriages.  She is a non-smoker.  No family history of DVT or malignancies. - She is up-to-date on her recommended age-appropriate cancer screenings: She had a colonoscopy on 02/02/2018 which showed no endoscopic evidence of bleeding, diverticula, mass, polyps or tumor in the entire colon.  She also has yearly mammograms which were normal.  No B symptoms or any indication of occult malignancy.   - Most recent D-dimer (03/16/2021) < 0.27 - She remains on Xarelto and is tolerating it well without any bleeding events  - No signs or symptoms of recurrent VTE - PLAN: Benefits continue to outweigh risks for long-term anticoagulation.  We will reassess in 1 year.  Refill for Xarelto sent to pharmacy x1 year.   PLAN SUMMARY & DISPOSITION: -Labs and RTC in 1 year  All questions were answered. The patient knows to call the clinic with any problems, questions or concerns.  Medical decision making: Low  Time spent on visit: I spent 15 minutes counseling the patient face to face. The total time spent in the appointment was 25 minutes and more than 50%  was on counseling.   Carnella Guadalajara, PA-C  03/23/2021 9:09 AM

## 2021-03-23 ENCOUNTER — Other Ambulatory Visit: Payer: Self-pay

## 2021-03-23 ENCOUNTER — Inpatient Hospital Stay (HOSPITAL_BASED_OUTPATIENT_CLINIC_OR_DEPARTMENT_OTHER): Payer: Self-pay | Admitting: Physician Assistant

## 2021-03-23 VITALS — BP 146/86 | HR 67 | Temp 97.9°F | Resp 16 | Wt 199.7 lb

## 2021-03-23 DIAGNOSIS — I2692 Saddle embolus of pulmonary artery without acute cor pulmonale: Secondary | ICD-10-CM

## 2021-03-23 NOTE — Patient Instructions (Signed)
[facility] at [facility] Discharge Instructions  You were seen today by Rojelio Brenner PA-C for your history of blood clot in your leg and lungs.  You have not had any alarm symptoms or concerning episodes on Xarelto, such as bleeding episodes or recurrent blood clots.  Therefore, we recommend that you continue on Xarelto indefinitely.  We will see you again in 1 year to reassess the risks and benefits of chronic anticoagulation.  If you have any recurrent blood clots or major bleeding episodes before your next visit, please call our office to be scheduled for earlier follow-up.  LABS: Return in 1 year for repeat labs  OTHER TESTS: None at this time  MEDICATIONS: No changes to home medications.  Continue Xarelto as prescribed.  FOLLOW-UP APPOINTMENT: Office visit with Rojelio Brenner PA-C in 1 year   Thank you for choosing [facility] at [facility] to provide your oncology and hematology care.  To afford each patient quality time with our provider, please arrive at least 15 minutes before your scheduled appointment time.   If you have a lab appointment with the Cancer Center please come in thru the Main Entrance and check in at the main information desk.  You need to re-schedule your appointment should you arrive 10 or more minutes late.  We strive to give you quality time with our providers, and arriving late affects you and other patients whose appointments are after yours.  Also, if you no show three or more times for appointments you may be dismissed from the clinic at the providers discretion.     Again, thank you for choosing [facility].  Our hope is that these requests will decrease the amount of time that you wait before being seen by our physicians.       _____________________________________________________________  Should you have questions after your visit to [facility], please contact our office at  [phone] and follow the prompts.  Our office hours are 8:00 a.m. and 4:30 p.m. Monday - Friday.  Please note that voicemails left after 4:00 p.m. may not be returned until the following business day.  We are closed weekends and major holidays.  You do have access to a nurse 24-7, just call the main number to the clinic [phone] and do not press any options, hold on the line and a nurse will answer the phone.    For prescription refill requests, have your pharmacy contact our office and allow 72 hours.    Due to Covid, you will need to wear a mask upon entering the hospital. If you do not have a mask, a mask will be given to you at the Main Entrance upon arrival. For doctor visits, patients may have 1 support person age 36 or older with them. For treatment visits, patients can not have anyone with them due to social distancing guidelines and our immunocompromised population.

## 2021-04-10 ENCOUNTER — Other Ambulatory Visit: Payer: Self-pay | Admitting: Neurology

## 2021-04-10 DIAGNOSIS — Z5320 Procedure and treatment not carried out because of patient's decision for unspecified reasons: Secondary | ICD-10-CM

## 2021-06-22 ENCOUNTER — Ambulatory Visit (INDEPENDENT_AMBULATORY_CARE_PROVIDER_SITE_OTHER): Payer: Self-pay | Admitting: Family Medicine

## 2021-06-22 ENCOUNTER — Encounter: Payer: Self-pay | Admitting: Family Medicine

## 2021-06-22 ENCOUNTER — Other Ambulatory Visit: Payer: Self-pay

## 2021-06-22 VITALS — BP 130/86 | HR 67 | Temp 98.0°F | Ht 65.0 in | Wt 194.0 lb

## 2021-06-22 DIAGNOSIS — I2510 Atherosclerotic heart disease of native coronary artery without angina pectoris: Secondary | ICD-10-CM

## 2021-06-22 DIAGNOSIS — B360 Pityriasis versicolor: Secondary | ICD-10-CM

## 2021-06-22 DIAGNOSIS — F419 Anxiety disorder, unspecified: Secondary | ICD-10-CM

## 2021-06-22 DIAGNOSIS — Z86711 Personal history of pulmonary embolism: Secondary | ICD-10-CM

## 2021-06-22 DIAGNOSIS — G40909 Epilepsy, unspecified, not intractable, without status epilepticus: Secondary | ICD-10-CM

## 2021-06-22 DIAGNOSIS — E669 Obesity, unspecified: Secondary | ICD-10-CM

## 2021-06-22 DIAGNOSIS — Z7901 Long term (current) use of anticoagulants: Secondary | ICD-10-CM

## 2021-06-22 DIAGNOSIS — Z0000 Encounter for general adult medical examination without abnormal findings: Secondary | ICD-10-CM

## 2021-06-22 DIAGNOSIS — M722 Plantar fascial fibromatosis: Secondary | ICD-10-CM

## 2021-06-22 DIAGNOSIS — E782 Mixed hyperlipidemia: Secondary | ICD-10-CM

## 2021-06-22 LAB — COMPREHENSIVE METABOLIC PANEL
ALT: 27 U/L (ref 0–35)
AST: 22 U/L (ref 0–37)
Albumin: 4.2 g/dL (ref 3.5–5.2)
Alkaline Phosphatase: 118 U/L — ABNORMAL HIGH (ref 39–117)
BUN: 17 mg/dL (ref 6–23)
CO2: 25 mEq/L (ref 19–32)
Calcium: 9.1 mg/dL (ref 8.4–10.5)
Chloride: 102 mEq/L (ref 96–112)
Creatinine, Ser: 0.94 mg/dL (ref 0.40–1.20)
GFR: 65.28 mL/min (ref >60.00)
Glucose, Bld: 79 mg/dL (ref 70–99)
Potassium: 3.9 mEq/L (ref 3.5–5.1)
Sodium: 138 mEq/L (ref 135–145)
Total Bilirubin: 0.4 mg/dL (ref 0.2–1.2)
Total Protein: 8 g/dL (ref 6.0–8.3)

## 2021-06-22 LAB — CBC WITH DIFFERENTIAL/PLATELET
Basophils Absolute: 0 10*3/uL (ref 0.0–0.1)
Basophils Relative: 0.6 % (ref 0.0–3.0)
Eosinophils Absolute: 0.1 10*3/uL (ref 0.0–0.7)
Eosinophils Relative: 1.9 % (ref 0.0–5.0)
HCT: 37.8 % (ref 36.0–46.0)
Hemoglobin: 12.8 g/dL (ref 12.0–15.0)
Lymphocytes Relative: 31.2 % (ref 12.0–46.0)
Lymphs Abs: 2.1 10*3/uL (ref 0.7–4.0)
MCHC: 33.8 g/dL (ref 30.0–36.0)
MCV: 92 fl (ref 78.0–100.0)
Monocytes Absolute: 0.5 10*3/uL (ref 0.1–1.0)
Monocytes Relative: 7.1 % (ref 3.0–12.0)
Neutro Abs: 3.9 10*3/uL (ref 1.4–7.7)
Neutrophils Relative %: 59.2 % (ref 43.0–77.0)
Platelets: 267 10*3/uL (ref 150.0–400.0)
RBC: 4.11 Mil/uL (ref 3.87–5.11)
RDW: 13.4 % (ref 11.5–15.5)
WBC: 6.6 10*3/uL (ref 4.0–10.5)

## 2021-06-22 LAB — LIPID PANEL
Cholesterol: 141 mg/dL (ref 0–200)
HDL: 58.5 mg/dL (ref >39.00)
LDL Cholesterol: 69 mg/dL (ref 0–99)
NonHDL: 82.17
Total CHOL/HDL Ratio: 2
Triglycerides: 65 mg/dL (ref 0.0–149.0)
VLDL: 13 mg/dL (ref 0.0–40.0)

## 2021-06-22 LAB — TSH: TSH: 0.59 u[IU]/mL (ref 0.35–5.50)

## 2021-06-22 MED ORDER — KETOCONAZOLE 2 % EX CREA: 1.0000 "application " | TOPICAL_CREAM | Freq: Two times a day (BID) | CUTANEOUS | 0 refills | Status: DC

## 2021-06-22 NOTE — Patient Instructions (Signed)
Please return in 12 months for your annual complete physical; please come fasting.   I will release your lab results to you on your MyChart account with further instructions. Please reply with any questions.    If you have any questions or concerns, please don't hesitate to send me a message via MyChart or call the office at [phone]. Thank you for visiting with [place] today! It's our pleasure caring for you.   Stress, Adult Stress is a normal reaction to life events. Stress is what you feel when life demands more than you are used to, or more than you think you can handle. Some stress can be useful, such as studying for a test or meeting a deadline at work. Stress that occurs too often or for too long can cause problems. Long-lasting stress is called chronic stress. Chronic stress can affect your emotional health and interfere with relationships and normal daily activities. Too much stress can weaken your body's defense system (immune system) and increase your risk for physical illness. If you already have a medical problem, stress can make it worse. What are the causes? All sorts of life events can cause stress. An event that causes stress for one person may not be stressful for someone else. Major life events, whether positive or negative, commonly cause stress. Examples include: Losing a job or starting a new job. Losing a loved one. Moving to a new town or home. Getting married or divorced. Having a baby. Getting injured or sick. Less obvious life events can also cause stress, especially if they occur day after day or in combination with each other. Examples include: Working long hours. Driving in traffic. Caring for children. Being in debt. Being in a difficult relationship. What are the signs or symptoms? Stress can cause emotional and physical symptoms and can lead to unhealthy behaviors. These include the following: Emotional symptoms Anxiety. This is feeling worried, afraid, on  edge, overwhelmed, or out of control. Anger, including irritation or impatience. Depression. This is feeling sad, down, helpless, or guilty. Trouble focusing, remembering, or making decisions. Physical symptoms Aches and pains. These may affect your head, neck, back, stomach, or other areas of your body. Tight muscles or a clenched jaw. Low energy. Trouble sleeping. Unhealthy behaviors Eating to feel better (overeating) or skipping meals. Working too much or putting off tasks. Smoking, drinking alcohol, or using drugs to feel better. How is this diagnosed? A stress disorder is diagnosed through an assessment by your health care provider. A stress disorder may be diagnosed based on: Your symptoms and any stressful life events. Your medical history. Tests to rule out other causes of your symptoms. Depending on your condition, your health care provider may refer you to a specialist for further evaluation. How is this treated? Stress management techniques are the recommended treatment for stress. Medicine is not typically recommended for treating stress. Techniques to reduce your reaction to stressful life events include: Identifying stress. Monitor yourself for symptoms of stress and notice what causes stress for you. These skills may help you to avoid or prepare for stressful events. Managing time. Set your priorities, keep a calendar of events, and learn to say no. These actions can help you avoid taking on too much. Techniques for dealing with stress include: Rethinking the problem. Try to think realistically about stressful events rather than ignoring them or overreacting. Try to find the positives in a stressful situation rather than focusing on the negatives. Exercise. Physical exercise can release both physical and  emotional tension. The key is to find a form of exercise that you enjoy and do it regularly. Relaxation techniques. These relax the body and mind. Find one or more that you  enjoy and use the techniques regularly. Examples include: Meditation, deep breathing, or progressive relaxation techniques. Yoga or tai chi. Biofeedback, mindfulness techniques, or journaling. Listening to music, being in nature, or taking part in other hobbies. Practicing a healthy lifestyle. Eat a balanced diet, drink plenty of water, limit or avoid caffeine, and get plenty of sleep. Having a strong support network. Spend time with family, friends, or other people you enjoy being around. Express your feelings and talk things over with someone you trust. Counseling or talk therapy with a mental health provider may help if you are having trouble managing stress by yourself. Follow these instructions at home: Lifestyle  Avoid drugs. Do not use any products that contain nicotine or tobacco. These products include cigarettes, chewing tobacco, and vaping devices, such as e-cigarettes. If you need help quitting, ask your health care provider. If you drink alcohol: Limit how much you have to: 0-1 drink a day for women who are not pregnant. 0-2 drinks a day for men. Know how much alcohol is in a drink. In the [place] one drink equals one 12 oz bottle of beer (355 mL), one 5 oz glass of wine (148 mL), or one 1 oz glass of hard liquor (44 mL). Do not use alcohol or drugs to relax. Eat a balanced diet that includes fresh fruits and vegetables, whole grains, lean meats, fish, eggs, beans, and low-fat dairy. Avoid processed foods and foods high in added fat, sugar, and salt. Exercise at least 30 minutes on 5 or more days each week. Get 7-8 hours of sleep each night. General instructions  Practice stress management techniques as told by your health care provider. Drink enough fluid to keep your urine pale yellow. Take over-the-counter and prescription medicines only as told by your health care provider. Keep all follow-up visits. This is important. Contact a health care provider if: Your symptoms get  worse. You have new symptoms. You feel overwhelmed by your problems and can no longer manage them by yourself. Get help right away if: You have thoughts of hurting yourself or others. Get help right awayif you feel like you may hurt yourself or others, or have thoughts about taking your own life. Go to your nearest emergency room or: Call 911. Call the [facility] at 715-143-6784 or 988. This is open 24 hours a day. Text the Crisis Text Line at 770-677-6907. Summary Stress is a normal reaction to life events. It can cause problems if it happens too often or for too long. Practicing stress management techniques is the best way to treat stress. Counseling or talk therapy with a mental health provider may help if you are having trouble managing stress by yourself. This information is not intended to replace advice given to you by your health care provider. Make sure you discuss any questions you have with your health care provider. Document Revised: 03/05/2021 Document Reviewed: 03/05/2021 Elsevier Patient Education  2022 [employer].  Plantar Fasciitis Plantar fasciitis is a painful foot condition that affects the heel. It occurs when the band of tissue that connects the toes to the heel bone (plantar fascia) becomes irritated. This can happen as the result of exercising too much or doing other repetitive activities (overuse injury). Plantar fasciitis can cause mild irritation to severe pain that makes it  difficult to walk or move. The pain is usually worse in the morning after sleeping, or after sitting or lying down for a period of time. Pain may also be worse after long periods of walking or standing. What are the causes? This condition may be caused by: Standing for long periods of time. Wearing shoes that do not have good arch support. Doing activities that put stress on joints (high-impact activities). This includes ballet and exercise that makes your heart beat  faster (aerobic exercise), such as running. Being overweight. An abnormal way of walking (gait). Tight muscles in the back of your lower leg (calf). High arches in your feet or flat feet. Starting a new athletic activity. What are the signs or symptoms? The main symptom of this condition is heel pain. Pain may get worse after the following: Taking the first steps after a time of rest, especially in the morning after awakening, or after you have been sitting or lying down for a while. Long periods of standing still. Pain may decrease after 30-45 minutes of activity, such as gentle walking. How is this diagnosed? This condition may be diagnosed based on your medical history, a physical exam, and your symptoms. Your health care provider will check for: A tender area on the bottom of your foot. A high arch in your foot or flat feet. Pain when you move your foot. Difficulty moving your foot. You may have imaging tests to confirm the diagnosis, such as: X-rays. Ultrasound. MRI. How is this treated? Treatment for plantar fasciitis depends on how severe your condition is. Treatment may include: Rest, ice, pressure (compression), and raising (elevating) the affected foot. This is called RICE therapy. Your health care provider may recommend RICE therapy along with over-the-counter pain medicines to manage your pain. Exercises to stretch your calves and your plantar fascia. A splint that holds your foot in a stretched, upward position while you sleep (night splint). Physical therapy to relieve symptoms and prevent problems in the future. Injections of steroid medicine (cortisone) to relieve pain and inflammation. Stimulating your plantar fascia with electrical impulses (extracorporeal shock wave therapy). This is usually the last treatment option before surgery. Surgery, if other treatments have not worked after 12 months. Follow these instructions at home: Managing pain, stiffness, and  swelling  If directed, put ice on the painful area. To do this: Put ice in a plastic bag, or use a frozen bottle of water. Place a towel between your skin and the bag or bottle. Roll the bottom of your foot over the bag or bottle. Do this for 20 minutes, 2-3 times a day. Wear athletic shoes that have air-sole or gel-sole cushions, or try soft shoe inserts that are designed for plantar fasciitis. Elevate your foot above the level of your heart while you are sitting or lying down. Activity Avoid activities that cause pain. Ask your health care provider what activities are safe for you. Do physical therapy exercises and stretches as told by your health care provider. Try activities and forms of exercise that are easier on your joints (low impact). Examples include swimming, water aerobics, and biking. General instructions Take over-the-counter and prescription medicines only as told by your health care provider. Wear a night splint while sleeping, if told by your health care provider. Loosen the splint if your toes tingle, become numb, or turn cold and blue. Maintain a healthy weight, or work with your health care provider to lose weight as needed. Keep all follow-up visits. This is important.  Contact a health care provider if you have: Symptoms that do not go away with home treatment. Pain that gets worse. Pain that affects your ability to move or do daily activities. Summary Plantar fasciitis is a painful foot condition that affects the heel. It occurs when the band of tissue that connects the toes to the heel bone (plantar fascia) becomes irritated. Heel pain is the main symptom of this condition. It may get worse after exercising too much or standing still for a long time. Treatment varies, but it usually starts with rest, ice, pressure (compression), and raising (elevating) the affected foot. This is called RICE therapy. Over-the-counter medicines can also be used to manage pain. This  information is not intended to replace advice given to you by your health care provider. Make sure you discuss any questions you have with your health care provider. Document Revised: 11/12/2019 Document Reviewed: 11/12/2019 Elsevier Patient Education  2022 [employer].

## 2021-06-22 NOTE — Progress Notes (Signed)
Subjective  Chief Complaint  Patient presents with   Transitions Of Care   Plantar Fasciitis    Ongoing since Summer time   Reviewed multiple medical records from specialist and primary care HPI: Sarah Chung is a 60 y.o. female who presents to [facility] [place] today for a Female Wellness Visit. She also has the concerns and/or needs as listed above in the chief complaint. These will be addressed in addition to the Health Maintenance Visit.   Wellness Visit: annual visit with health maintenance review and exam without Pap  Health Maintenance  Topic Date Due   Pneumococcal Vaccine 57-72 Years old (1 - PCV) Never done   COVID-19 Vaccine (4 - Booster for Pfizer series) 08/30/2020   PAP SMEAR-Modifier  08/09/2021   TETANUS/TDAP  06/22/2022 (Originally 07/01/1980)   MAMMOGRAM  10/20/2021   COLONOSCOPY (Pts 45-40yrs Insurance coverage will need to be confirmed)  02/03/2028   HIV Screening  Completed   HPV VACCINES  Aged Out   INFLUENZA VACCINE  Discontinued   Hepatitis C Screening  Discontinued   Zoster Vaccines- Shingrix  Discontinued    Chronic disease f/u and/or acute problem visit: (deemed necessary to be done in addition to the wellness visit): 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  Currently stable.  No bleeding adverse effects. Mixed hyperlipidemia: She has nonobstructive atherosclerotic coronary artery disease so goal LDL is less than 70.  She is on Lipitor 80 mg nightly and Zetia 10 mg nightly.  Reviewed most recent cardiology notes.  She is due for recheck.  She is tolerating her medications well.  She has no chest pain. Seizure disorder: Managed by neurology.  Remains on phenobarbital.  Stable Chronic anxiety: Multiple psychosocial stressors.  Patient describes multiple difficulties including separated from her husband, stressful life situations with daughter and grandchildren and chronic  anxiety.  She fears taking medications.  She has been to counseling in the past but feels this is too costly.  Overall she feels that she is managing okay, however she is stressed and tearful in the office today. She struggles with her weight.  Hard to lose weight this bothers her greatly.  She was put on metformin but this has not been helpful.  She has used Ozempic in the past but it is very costly. She complains of right greater than left Planter fasciitis: Symptoms are described as heel pain worse in the daytime or if she sits for prolonged period of time.  This started several months ago.  She cannot use NSAIDs due to Eliquis.  Tylenol is mildly helpful.  No midfoot pain.  No injury Complains of rash on bilateral shoulders since the summertime.  No itching, no hives  Assessment  1. Annual physical exam   2. Chronic anticoagulation   3. History of pulmonary embolism   4. Mixed hyperlipidemia   5. Nonobstructive atherosclerosis of coronary artery   6. Seizure disorder (HCC)   7. Chronic anxiety   8. Obesity (BMI 30-39.9)   9. Plantar fasciitis   10. Tinea versicolor      Plan  Female Wellness Visit: Age appropriate Health Maintenance and Prevention measures were discussed with patient. Included topics are cancer screening recommendations, ways to keep healthy (see AVS) including dietary and exercise recommendations, regular eye and dental care, use of seat belts, and avoidance of moderate alcohol use and tobacco use.  Screens are current BMI: discussed patient's BMI and encouraged  positive lifestyle modifications to help get to or maintain a target BMI. HM needs and immunizations were addressed and ordered. See below for orders. See HM and immunization section for updates.  Declines immunizations Routine labs and screening tests ordered including cmp, cbc and lipids where appropriate. Discussed recommendations regarding Vit D and calcium supplementation (see AVS)  Chronic disease  management visit and/or acute problem visit: Hypercoagulable state on long-term anticoagulation: Stable.  Monitor CBC. Mixed hyperlipidemia: Recheck nonfasting lipid on Zetia and high-dose Lipitor 80 Nonobstructive coronary artery disease: Currently not pathologic.  Secondary prevention discussed Chronic anxiety: Counseling recommended.  Patient defers treatment at this time.  Monitor.  Counseling done Plantar fasciitis: Recommend heel lift, ice and massage and Tylenol.  See handout Tinea versicolor: Ketoconazole cream twice daily and Selsun Blue shampoo. Seizures are controlled per neurology.  Follow up: Return in about 1 year (around 06/22/2022) for complete physical.  Orders Placed This Encounter  Procedures   CBC with Differential/Platelet   Comprehensive metabolic panel   Lipid panel   TSH   Meds ordered this encounter  Medications   ketoconazole (NIZORAL) 2 % cream    Sig: Apply 1 application topically 2 (two) times daily for 7 days. Then as needed    Dispense:  60 g    Refill:  0      Body mass index is 32.28 kg/m. Wt Readings from Last 3 Encounters:  06/22/21 194 lb (88 kg)  03/23/21 199 lb 11.2 oz (90.6 kg)  01/01/21 198 lb (89.8 kg)     Patient Active Problem List   Diagnosis Date Noted   Chronic anticoagulation 06/22/2021    H/o PE x 2    Seizure disorder ([place]) 08/27/2020   Mixed hyperlipidemia 06/20/2020   Obesity 06/20/2020   Nonobstructive atherosclerosis of coronary artery 04/20/2019   Pulmonary embolism ([place]) x2 02/09/2019   Calculus of gallbladder with acute cholecystitis, without mention of obstruction 07/03/2013   Health Maintenance  Topic Date Due   Pneumococcal Vaccine 16-70 Years old (1 - PCV) Never done   COVID-19 Vaccine (4 - Booster for Pfizer series) 08/30/2020   PAP SMEAR-Modifier  08/09/2021   TETANUS/TDAP  06/22/2022 (Originally 07/01/1980)   MAMMOGRAM  10/20/2021   COLONOSCOPY (Pts 45-71yrs Insurance coverage will need to be  confirmed)  02/03/2028   HIV Screening  Completed   HPV VACCINES  Aged Out   INFLUENZA VACCINE  Discontinued   Hepatitis C Screening  Discontinued   Zoster Vaccines- Shingrix  Discontinued   Immunization History  Administered Date(s) Administered   Influenza,inj,Quad PF,6+ Mos 06/20/2020   PFIZER(Purple Top)SARS-COV-2 Vaccination 08/30/2019, 09/20/2019   We updated and reviewed the patient's past history in detail and it is documented below. Allergies: Patient is allergic to tegretol [carbamazepine], keppra [levetiracetam], ketorolac tromethamine, and sulfa antibiotics. Past Medical History Patient  has a past medical history of Allergy, Clotting disorder ([place]), DVT (deep vein thrombosis) in pregnancy, Gallstones, Hyperlipidemia, Pulmonary embolism ([place]), and Seizures ([place]). Past Surgical History Patient  has a past surgical history that includes Cholecystectomy (N/A, 07/02/2013) and LEFT HEART CATH AND CORONARY ANGIOGRAPHY (N/A, 04/20/2019). Family History: Patient family history includes Depression in her sister; Berenice Primas' disease in her mother; Heart attack in her brother, father, and paternal grandmother; Heart disease in her brother and father; Kidney disease in her maternal grandfather; Mental illness in her mother; Parkinson's disease in her father. Social History:  Patient  reports that she has never smoked. She has never used smokeless tobacco. She reports that  she does not drink alcohol and does not use drugs.  Review of Systems: Constitutional: negative for fever or malaise Ophthalmic: negative for photophobia, double vision or loss of vision Cardiovascular: negative for chest pain, dyspnea on exertion, or new LE swelling Respiratory: negative for SOB or persistent cough Gastrointestinal: negative for abdominal pain, change in bowel habits or melena Genitourinary: negative for dysuria or gross hematuria, no abnormal uterine bleeding or disharge Musculoskeletal: negative for new  gait disturbance or muscular weakness Integumentary: negative for new or persistent rashes, no breast lumps Neurological: negative for TIA or stroke symptoms Psychiatric: negative for SI or delusions Allergic/Immunologic: negative for hives  Patient Care Team    Relationship Specialty Notifications Start End  Leamon Arnt, MD PCP - General Family Medicine  03/16/21   Louretta Shorten, MD Consulting Physician Obstetrics and Gynecology  06/20/20   Dohmeier, Asencion Partridge, MD Consulting Physician Neurology  06/20/20   Laurence Spates, MD (Inactive) Consulting Physician Gastroenterology  06/20/20   Derek Jack, MD Consulting Physician Hematology  06/20/20   Laurence Aly, OD  Optometry  06/20/20   Glen Elder, [place] Physician Cardiology  06/08/21     Objective  Vitals: BP 130/86   Pulse 67   Temp 98 F (36.7 C)   Ht 5\' 5"  (1.651 m)   Wt 194 lb (88 kg)   SpO2 98%   BMI 32.28 kg/m  General:  Well developed, well nourished, no acute distress  Psych:  Alert and orientedx3, anxious mood and affect, tearful HEENT:  Normocephalic, atraumatic, non-icteric sclera,  supple neck without adenopathy, mass or thyromegaly Cardiovascular:  Normal S1, S2, RRR without gallop, rub or murmur Respiratory:  Good breath sounds bilaterally, CTAB with normal respiratory effort Gastrointestinal: normal bowel sounds, soft, non-tender, no noted masses. No HSM MSK: no deformities, contusions. Joints are without erythema or swelling.  Skin:  Warm, hypopigmented macular patchy rash bilateral shoulders and back.  Mild flaking Neurologic:    Mental status is normal. CN 2-11 are normal. Gross motor and sensory exams are normal. Normal gait. No tremor   Commons side effects, risks, benefits, and alternatives for medications and treatment plan prescribed today were discussed, and the patient expressed understanding of the given instructions. Patient is instructed to call or message via MyChart if he/she has any  questions or concerns regarding our treatment plan. No barriers to understanding were identified. We discussed Red Flag symptoms and signs in detail. Patient expressed understanding regarding what to do in case of urgent or emergency type symptoms.  Medication list was reconciled, printed and provided to the patient in AVS. Patient instructions and summary information was reviewed with the patient as documented in the AVS. This note was prepared with assistance of Dragon voice recognition software. Occasional wrong-word or sound-a-like substitutions may have occurred due to the inherent limitations of voice recognition software  This visit occurred during the SARS-CoV-2 public health emergency.  Safety protocols were in place, including screening questions prior to the visit, additional usage of staff PPE, and extensive cleaning of exam room while observing appropriate contact time as indicated for disinfecting solutions.

## 2021-06-23 ENCOUNTER — Other Ambulatory Visit: Payer: Self-pay

## 2021-06-23 MED ORDER — KETOCONAZOLE 2 % EX CREA: 1.0000 "application " | TOPICAL_CREAM | Freq: Two times a day (BID) | CUTANEOUS | 0 refills | Status: AC

## 2021-07-06 ENCOUNTER — Other Ambulatory Visit: Payer: Self-pay | Admitting: Physician Assistant

## 2021-07-06 ENCOUNTER — Other Ambulatory Visit: Payer: Self-pay | Admitting: Neurology

## 2021-07-06 DIAGNOSIS — Z780 Asymptomatic menopausal state: Secondary | ICD-10-CM

## 2021-07-16 ENCOUNTER — Encounter: Payer: Self-pay | Admitting: Family Medicine

## 2021-07-22 ENCOUNTER — Other Ambulatory Visit: Payer: Self-pay

## 2021-07-22 ENCOUNTER — Ambulatory Visit (INDEPENDENT_AMBULATORY_CARE_PROVIDER_SITE_OTHER): Payer: Self-pay

## 2021-07-22 DIAGNOSIS — Z23 Encounter for immunization: Secondary | ICD-10-CM

## 2021-08-11 ENCOUNTER — Other Ambulatory Visit: Payer: Self-pay | Admitting: *Deleted

## 2021-08-11 MED ORDER — ATORVASTATIN CALCIUM 80 MG PO TABS: 80.0000 mg | ORAL_TABLET | Freq: Every day | ORAL | 0 refills | Status: DC

## 2021-08-13 ENCOUNTER — Other Ambulatory Visit: Payer: Self-pay | Admitting: Physician Assistant

## 2021-08-13 NOTE — Telephone Encounter (Signed)
This is a [facility] pt.  °

## 2021-08-27 ENCOUNTER — Ambulatory Visit (INDEPENDENT_AMBULATORY_CARE_PROVIDER_SITE_OTHER): Payer: Self-pay | Admitting: Neurology

## 2021-08-27 ENCOUNTER — Encounter: Payer: Self-pay | Admitting: Neurology

## 2021-08-27 VITALS — BP 152/88 | HR 74 | Ht 65.0 in | Wt 205.0 lb

## 2021-08-27 DIAGNOSIS — G40909 Epilepsy, unspecified, not intractable, without status epilepticus: Secondary | ICD-10-CM

## 2021-08-27 DIAGNOSIS — Z780 Asymptomatic menopausal state: Secondary | ICD-10-CM

## 2021-08-27 DIAGNOSIS — Z5320 Procedure and treatment not carried out because of patient's decision for unspecified reasons: Secondary | ICD-10-CM

## 2021-08-27 MED ORDER — PHENOBARBITAL 64.8 MG PO TABS: ORAL_TABLET | ORAL | 1 refills | Status: DC
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
## 2021-08-27 NOTE — Patient Instructions (Signed)
Phenobarbital tablets What is this medication? PHENOBARBITAL (fee noe BAR bi tal) is a barbiturate. It may be used to help you sleep or may be used to help control seizures. This medicine may be used for other purposes; ask your health care provider or pharmacist if you have questions. What should I tell my care team before I take this medication? They need to know if you have any of these conditions: drug abuse or addiction if you frequently drink alcohol containing drinks kidney disease liver disease lung disease or breathing problems porphyria suicidal thoughts, plans, or attempt; a previous suicide attempt by you or a family member an unusual or allergic reaction to phenobarbital, other barbiturates, other medicines, lactose, foods, dyes, or preservatives pregnant or trying to get pregnant breast-feeding How should I use this medication? Take this medicine by mouth with a full glass of water. Follow the directions on the prescription label. Take your medicine at regular intervals. Do not take your medicine more often than directed. Do not stop taking except on your doctor's advice. If you have been taking this medicine regularly and suddenly stop taking it, you may increase the risk of seizures. Your doctor may gradually reduce the dose. Talk to your pediatrician regarding the use of this medicine in children. Special care may be needed. Patients over 47 years old may have a stronger reaction and need a smaller dose. Overdosage: If you think you have taken too much of this medicine contact a poison control center or emergency room at once. NOTE: This medicine is only for you. Do not share this medicine with others. What if I miss a dose? If you miss a dose, take it as soon as you can. If it is almost time for your next dose, take only that dose. Do not take double or extra doses. What may interact with this medication? Do not take this medicine with any of the following  medications: certain medicines used to treat HIV infection or AIDS that are given in combination with cobicistat  other barbiturates  primidone  voriconazole This medicine may also interact with the following medications:  alcohol or medicines that contain alcohol  antihistamines  medicines for depression, anxiety, or psychotic disturbances  medicines for pain including pentazocine, buprenorphine, butorphanol, nalbuphine, tramadol, and propoxyphene  medicines for sleep  muscle relaxants  steroid medicines like prednisone or cortisone This list may not describe all possible interactions. Give your health care provider a list of all the medicines, herbs, non-prescription drugs, or dietary supplements you use. Also tell them if you smoke, drink alcohol, or use illegal drugs. Some items may interact with your medicine. What should I watch for while using this medication? Visit your doctor or health care provider for regular checks on your progress. This medicine may cause serious skin reactions. They can happen weeks to months after starting the medicine. Contact your health care provider right away if you notice fevers or flu-like symptoms with a rash. The rash may be red or purple and then turn into blisters or peeling of the skin. Or, you might notice a red rash with swelling of the face, lips or lymph nodes in your neck or under your arms. If you are taking this medicine for seizures, wear a medical identification bracelet or chain to say you have seizures, and carry a card that lists all your medications. You may get drowsy or dizzy. Do not drive, use machinery, or do anything that needs mental alertness until you know how this  medicine affects you. Do not stand or sit up quickly, especially if you are an older patient. This reduces the risk of dizzy or fainting spells. Alcohol may interfere with the effect of this medicine. Avoid alcoholic drinks. Birth control pills may not work  properly while you are taking this medicine. Talk to your doctor about using an extra method of birth control. The use of this medicine may increase the chance of suicidal thoughts or actions. Pay special attention to how you are responding while on this medicine. Any worsening of mood, or thoughts of suicide or dying should be reported to your health care provider right away. Women who become pregnant while using this medicine may enroll in the [place] Antiepileptic Drug Pregnancy Registry by calling [phone]. This registry collects information about the safety of antiepileptic drug use during pregnancy. This medicine may cause a decrease in vitamin D and folic acid. You should make sure that you get enough vitamins while you are taking this medicine. Discuss the foods you eat and the vitamins you take with your health care provider. What side effects may I notice from receiving this medication? Side effects that you should report to your doctor or health care professional as soon as possible: allergic reactions like skin rash, itching or hives, swelling of the face, lips, or tongue breathing problems fever, chills, or sore throat rash, fever, and swollen lymph nodes redness, blistering, peeling or loosening of the skin, including inside the mouth unusual bleeding or bruising unusually weak or tired worsening of mood, thoughts or actions of suicide or dying yellowing of the eyes or skin Side effects that usually do not require medical attention (report to your doctor or health care professional if they continue or are bothersome): dizziness drowsiness headache irritability or nervousness nausea This list may not describe all possible side effects. Call your doctor for medical advice about side effects. You may report side effects to FDA at 1-800-FDA-1088. Where should I keep my medication? Keep out of the reach of children. This medicine can be abused. Keep your medicine in a  safe place to protect it from theft. Do not share this medicine with anyone. Selling or giving away this medicine is dangerous and against the law. This medicine may cause accidental overdose and death if taken by other adults, children, or pets. Mix any unused medicine with a substance like cat litter or coffee grounds. Then throw the medicine away in a sealed container like a sealed bag or a coffee can with a lid. Do not use the medicine after the expiration date. Store at room temperature between 20 and 25 degrees C (68 and 77 degrees F). Keep container tightly closed. Protect from light. NOTE: This sheet is a summary. It may not cover all possible information. If you have questions about this medicine, talk to your doctor, pharmacist, or health care provider.  2022 Elsevier/Gold Standard (2018-11-08 00:00:00)

## 2021-08-28 LAB — COMPREHENSIVE METABOLIC PANEL
ALT: 25 IU/L (ref 0–32)
AST: 25 IU/L (ref 0–40)
Albumin/Globulin Ratio: 1.2 (ref 1.2–2.2)
Albumin: 4.5 g/dL (ref 3.8–4.8)
Alkaline Phosphatase: 158 IU/L — ABNORMAL HIGH (ref 44–121)
BUN/Creatinine Ratio: 16 (ref 12–28)
BUN: 14 mg/dL (ref 8–27)
Bilirubin Total: 0.4 mg/dL (ref 0.0–1.2)
CO2: 20 mmol/L (ref 20–29)
Calcium: 9.5 mg/dL (ref 8.7–10.3)
Chloride: 103 mmol/L (ref 96–106)
Creatinine, Ser: 0.89 mg/dL (ref 0.57–1.00)
Globulin, Total: 3.8 g/dL (ref 1.5–4.5)
Glucose: 101 mg/dL — ABNORMAL HIGH (ref 70–99)
Potassium: 5.1 mmol/L (ref 3.5–5.2)
Sodium: 143 mmol/L (ref 134–144)
Total Protein: 8.3 g/dL (ref 6.0–8.5)
eGFR: 73 mL/min/{1.73_m2} (ref >59)

## 2021-08-28 NOTE — Progress Notes (Signed)
In general , normal metabolism by this non-fast blood sample. Alk phos is high and can indicate osteopenia progression, check for bone density concerns with PCP , please.  LIVER function is NORMAL.

## 2021-08-31 ENCOUNTER — Encounter: Payer: Self-pay | Admitting: Neurology

## 2021-09-06 ENCOUNTER — Telehealth: Payer: Self-pay | Admitting: Emergency Medicine

## 2021-09-06 DIAGNOSIS — R300 Dysuria: Secondary | ICD-10-CM

## 2021-09-06 DIAGNOSIS — R350 Frequency of micturition: Secondary | ICD-10-CM

## 2021-09-06 MED ORDER — NITROFURANTOIN MONOHYD MACRO 100 MG PO CAPS: 100.0000 mg | ORAL_CAPSULE | Freq: Two times a day (BID) | ORAL | 0 refills | Status: AC

## 2021-09-06 NOTE — Progress Notes (Signed)
Virtual Visit Consent   Sarah Chung, you are scheduled for a virtual visit with a [facility] provider today.     Just as with appointments in the office, your consent must be obtained to participate.  Your consent will be active for this visit and any virtual visit you may have with one of our providers in the next 365 days.     If you have a MyChart account, a copy of this consent can be sent to you electronically.  All virtual visits are billed to your insurance company just like a traditional visit in the office.    As this is a virtual visit, video technology does not allow for your provider to perform a traditional examination.  This may limit your provider's ability to fully assess your condition.  If your provider identifies any concerns that need to be evaluated in person or the need to arrange testing (such as labs, EKG, etc.), we will make arrangements to do so.     Although advances in technology are sophisticated, we cannot ensure that it will always work on either your end or our end.  If the connection with a video visit is poor, the visit may have to be switched to a telephone visit.  With either a video or telephone visit, we are not always able to ensure that we have a secure connection.     I need to obtain your verbal consent now.   Are you willing to proceed with your visit today? Yes   Sarah Chung has provided verbal consent on 09/06/2021 for a virtual visit (video or telephone).   Rennis Harding, [place]   Date: 09/06/2021 5:24 PM   Virtual Visit via Video Note   I, Rennis Harding, connected with  Sarah Chung  ([id number], 1961-07-22) on 09/06/21 at  5:15 PM EST by a video-enabled telemedicine application and verified that I am speaking with the correct person using two identifiers.  Location: Patient: Virtual Visit Location Patient: Home Provider: Virtual Visit Location Provider: Home Office   I discussed the limitations of evaluation and management by telemedicine  and the availability of in person appointments. The patient expressed understanding and agreed to proceed.    History of Present Illness: Sarah Chung is a 61 y.o. who identifies as a female who was assigned female at birth, and is being seen today for frequency, burning, and pink colored urine that began today.  Patient denies a precipitating event, recent sexual encounter, excessive caffeine intake.  Pain is intermittent and describes it as burning.  Has NOT tried OTC medications  Symptoms are made worse with urination.  Denies similar symptoms in the past.  Denies fever, chills, nausea, vomiting, abdominal pain, flank pain, abnormal vaginal discharge or bleeding.    LMP: No LMP recorded. Patient is postmenopausal.  ROS: As in HPI.  All other pertinent ROS negative.     HPI: HPI  Problems:  Patient Active Problem List   Diagnosis Date Noted   Chronic anticoagulation 06/22/2021   Seizure disorder (HCC) 08/27/2020   Mixed hyperlipidemia 06/20/2020   Obesity 06/20/2020   Nonobstructive atherosclerosis of coronary artery 04/20/2019   Pulmonary embolism (HCC) x2 02/09/2019   Calculus of gallbladder with acute cholecystitis, without mention of obstruction 07/03/2013    Allergies:  Allergies  Allergen Reactions   Tegretol [Carbamazepine] Hives   Keppra [Levetiracetam]     Feels outside her body.[name]   Ketorolac Tromethamine    Sulfa Antibiotics Hives  Medications:  Current Outpatient Medications:    nitrofurantoin, macrocrystal-monohydrate, (MACROBID) 100 MG capsule, Take 1 capsule (100 mg total) by mouth 2 (two) times daily for 5 days., Disp: 10 capsule, Rfl: 0   atorvastatin (LIPITOR) 80 MG tablet, Take 1 tablet (80 mg total) by mouth daily., Disp: 90 tablet, Rfl: 0   ezetimibe (ZETIA) 10 MG tablet, TAKE 1 TABLET(10 MG) BY MOUTH DAILY, Disp: 30 tablet, Rfl: 11   PHENobarbital (LUMINAL) 64.8 MG tablet, TAKE 1 AND 1/2 TABLETS BY MOUTH EVERY NIGHT AT BEDTIME, Disp: 135 tablet, Rfl: 1    XARELTO 20 MG TABS tablet, TAKE 1 TABLET(20 MG) BY MOUTH DAILY WITH SUPPER, Disp: 30 tablet, Rfl: 12  Observations/Objective: Patient is well-developed, well-nourished in no acute distress.  Resting comfortably at home. Well appearing nontoxic Head is normocephalic, atraumatic.  No labored breathing. Speaking in full sentences and tolerating own secretions Speech is clear and coherent with logical content.  Patient is alert and oriented at baseline.   Assessment and Plan: 1. Dysuria  2. Urinary frequency  Push fluids and get plenty of rest.   You may use OTC AZO for symptomatic relief Take antibiotic as directed and to completion Follow up with PCP if symptoms persists and to have urine  rechecked to ensure blood has resolved Follow up in person at urgent care or go to ER if you have any new or worsening symptoms such as fever, abdominal pain, nausea/vomiting, flank pain, etc...  Follow Up Instructions: I discussed the assessment and treatment plan with the patient. The patient was provided an opportunity to ask questions and all were answered. The patient agreed with the plan and demonstrated an understanding of the instructions.  A copy of instructions were sent to the patient via MyChart unless otherwise noted below.   The patient was advised to call back or seek an in-person evaluation if the symptoms worsen or if the condition fails to improve as anticipated.  Time:  I spent 5-10 minutes with the patient via telehealth technology discussing the above problems/concerns.    Rennis Harding, PA-C

## 2021-09-06 NOTE — Patient Instructions (Signed)
°  Allyson Sabal, thank you for joining Lestine Box, PA-C for today's virtual visit.  While this provider is not your primary care provider (PCP), if your PCP is located in our provider database this encounter information will be shared with them immediately following your visit.  Consent: (Patient) Allyson Sabal provided verbal consent for this virtual visit at the beginning of the encounter.  Current Medications:  Current Outpatient Medications:    nitrofurantoin, macrocrystal-monohydrate, (MACROBID) 100 MG capsule, Take 1 capsule (100 mg total) by mouth 2 (two) times daily for 5 days., Disp: 10 capsule, Rfl: 0   atorvastatin (LIPITOR) 80 MG tablet, Take 1 tablet (80 mg total) by mouth daily., Disp: 90 tablet, Rfl: 0   ezetimibe (ZETIA) 10 MG tablet, TAKE 1 TABLET(10 MG) BY MOUTH DAILY, Disp: 30 tablet, Rfl: 11   PHENobarbital (LUMINAL) 64.8 MG tablet, TAKE 1 AND 1/2 TABLETS BY MOUTH EVERY NIGHT AT BEDTIME, Disp: 135 tablet, Rfl: 1   XARELTO 20 MG TABS tablet, TAKE 1 TABLET(20 MG) BY MOUTH DAILY WITH SUPPER, Disp: 30 tablet, Rfl: 12   Medications ordered in this encounter:  Meds ordered this encounter  Medications   nitrofurantoin, macrocrystal-monohydrate, (MACROBID) 100 MG capsule    Sig: Take 1 capsule (100 mg total) by mouth 2 (two) times daily for 5 days.    Dispense:  10 capsule    Refill:  0    Order Specific Question:   Supervising Provider    Answer:   Sabra Heck, [place]     *If you need refills on other medications prior to your next appointment, please contact your pharmacy*  Follow-Up: Call back or seek an in-person evaluation if the symptoms worsen or if the condition fails to improve as anticipated.  Other Instructions Push fluids and get plenty of rest.   You may use OTC AZO for symptomatic relief Take antibiotic as directed and to completion Follow up with PCP if symptoms persists and to have urine  rechecked to ensure blood has resolved Follow up in person at  urgent care or go to ER if you have any new or worsening symptoms such as fever, abdominal pain, nausea/vomiting, flank pain, etc...   If you have been instructed to have an in-person evaluation today at a local Urgent Care facility, please use the link below. It will take you to a list of all of our available [facility], including address, phone number and hours of operation. Please do not delay care.  [facility]  If you or a family member do not have a primary care provider, use the link below to schedule a visit and establish care. When you choose a [facility] primary care physician or advanced practice provider, you gain a long-term partner in health. Find a Primary Care Provider  Learn more about [facility]'s in-office and virtual care options: [facility]

## 2021-09-15 ENCOUNTER — Ambulatory Visit (INDEPENDENT_AMBULATORY_CARE_PROVIDER_SITE_OTHER): Payer: Self-pay | Admitting: Family Medicine

## 2021-09-15 ENCOUNTER — Other Ambulatory Visit: Payer: Self-pay

## 2021-09-15 ENCOUNTER — Telehealth: Payer: Self-pay | Admitting: Family Medicine

## 2021-09-15 ENCOUNTER — Encounter: Payer: Self-pay | Admitting: Family Medicine

## 2021-09-15 VITALS — BP 124/90 | HR 72 | Temp 97.1°F | Ht 64.0 in | Wt 201.8 lb

## 2021-09-15 DIAGNOSIS — R350 Frequency of micturition: Secondary | ICD-10-CM

## 2021-09-15 LAB — POCT URINALYSIS DIPSTICK
Bilirubin, UA: NEGATIVE
Blood, UA: NEGATIVE
Glucose, UA: NEGATIVE
Ketones, UA: NEGATIVE
Leukocytes, UA: NEGATIVE
Nitrite, UA: NEGATIVE
Protein, UA: NEGATIVE
Spec Grav, UA: >=1.03 — AB (ref 1.010–1.025)
Urobilinogen, UA: 0.2 U/dL
pH, UA: 6 (ref 5.0–8.0)

## 2021-09-15 NOTE — Telephone Encounter (Signed)
Entered in error

## 2021-09-15 NOTE — Progress Notes (Signed)
Subjective  CC:  Chief Complaint  Patient presents with   Urinary Urgency    Treated for UTI; still with mild sxs. Wants checked.     HPI: Sarah Chung is a 61 y.o. female who presents to the office today to address the problems listed above in the chief complaint. Video visit from 129: Clinically diagnosed with UTI with dysuria, and treated with Macrobid x5 days.  Symptoms almost all resolved although she still has mild urinary urgency symptoms.  No gross hematuria.  No vaginal discharge or vaginal itching.  No pelvic pain, fevers chills or flank pain.  Wants to make sure it is gone.  No history of UTIs.  Assessment  1. Urinary frequency      Plan  Acute cystitis, resolved: No further signs of infection.  Urgency could be related to concentrated urine.  Increase fluid intake.  Monitor.  Follow up: November for complete physical Visit date not found  Orders Placed This Encounter  Procedures   POCT Urinalysis Dipstick   No orders of the defined types were placed in this encounter.     I reviewed the patients updated PMH, FH, and SocHx.    Patient Active Problem List   Diagnosis Date Noted   Chronic anticoagulation 06/22/2021   Seizure disorder ([place]) 08/27/2020   Mixed hyperlipidemia 06/20/2020   Obesity 06/20/2020   Nonobstructive atherosclerosis of coronary artery 04/20/2019   Pulmonary embolism ([place]) x2 02/09/2019   Calculus of gallbladder with acute cholecystitis, without mention of obstruction 07/03/2013   Current Meds  Medication Sig   atorvastatin (LIPITOR) 80 MG tablet Take 1 tablet (80 mg total) by mouth daily.   ezetimibe (ZETIA) 10 MG tablet TAKE 1 TABLET(10 MG) BY MOUTH DAILY   PHENobarbital (LUMINAL) 64.8 MG tablet TAKE 1 AND 1/2 TABLETS BY MOUTH EVERY NIGHT AT BEDTIME   XARELTO 20 MG TABS tablet TAKE 1 TABLET(20 MG) BY MOUTH DAILY WITH SUPPER    Allergies: Patient is allergic to tegretol [carbamazepine], keppra [levetiracetam], ketorolac tromethamine,  and sulfa antibiotics. Family History: Patient family history includes Depression in her sister; Berenice Primas' disease in her mother; Heart attack in her brother, father, and paternal grandmother; Heart disease in her brother and father; Kidney disease in her maternal grandfather; Mental illness in her mother; Parkinson's disease in her father. Social History:  Patient  reports that she has never smoked. She has never used smokeless tobacco. She reports that she does not drink alcohol and does not use drugs.  Review of Systems: Constitutional: Negative for fever malaise or anorexia Cardiovascular: negative for chest pain Respiratory: negative for SOB or persistent cough Gastrointestinal: negative for abdominal pain  Objective  Vitals: BP 124/90    Pulse 72    Temp (!) 97.1 F (36.2 C) (Temporal)    Ht 5\' 4"  (1.626 m)    Wt 201 lb 12.8 oz (91.5 kg)    SpO2 98%    BMI 34.64 kg/m  Appears well No suprapubic tenderness Skin:  Warm, no rashes  Office Visit on 09/15/2021  Component Date Value Ref Range Status   Color, UA 09/15/2021 yellow   Final   Glucose, UA 09/15/2021 Negative  Negative Final   Bilirubin, UA 09/15/2021 Negative   Final   Ketones, UA 09/15/2021 Negative   Final   Spec Grav, UA 09/15/2021 >=1.030 (A)  1.010 - 1.025 Final   Blood, UA 09/15/2021 Negative   Final   pH, UA 09/15/2021 6.0  5.0 - 8.0 Final  Protein, UA 09/15/2021 Negative  Negative Final   Urobilinogen, UA 09/15/2021 0.2  0.2 or 1.0 E.U./dL Final   Nitrite, UA 09/15/2021 Negative   Final   Leukocytes, UA 09/15/2021 Negative  Negative Final   Appearance 09/15/2021 Cloudy   Final     Commons side effects, risks, benefits, and alternatives for medications and treatment plan prescribed today were discussed, and the patient expressed understanding of the given instructions. Patient is instructed to call or message via MyChart if he/she has any questions or concerns regarding our treatment plan. No barriers to  understanding were identified. We discussed Red Flag symptoms and signs in detail. Patient expressed understanding regarding what to do in case of urgent or emergency type symptoms.  Medication list was reconciled, printed and provided to the patient in AVS. Patient instructions and summary information was reviewed with the patient as documented in the AVS. This note was prepared with assistance of Dragon voice recognition software. Occasional wrong-word or sound-a-like substitutions may have occurred due to the inherent limitations of voice recognition software  This visit occurred during the SARS-CoV-2 public health emergency.  Safety protocols were in place, including screening questions prior to the visit, additional usage of staff PPE, and extensive cleaning of exam room while observing appropriate contact time as indicated for disinfecting solutions.

## 2021-09-15 NOTE — Patient Instructions (Signed)
Please return in November for your complete physical.  Your urine testing today is normal.   If you have any questions or concerns, please don't hesitate to send me a message via MyChart or call the office at [phone]. Thank you for visiting with [place] today! It's our pleasure caring for you.

## 2021-09-18 ENCOUNTER — Encounter: Payer: Self-pay | Admitting: Family Medicine

## 2021-09-18 NOTE — Telephone Encounter (Signed)
LVM for patient to return call. 

## 2021-09-27 ENCOUNTER — Telehealth: Payer: Self-pay | Admitting: Nurse Practitioner

## 2021-09-27 DIAGNOSIS — J029 Acute pharyngitis, unspecified: Secondary | ICD-10-CM

## 2021-09-27 MED ORDER — LIDOCAINE VISCOUS HCL 2 % MT SOLN: 5.0000 mL | Freq: Four times a day (QID) | OROMUCOSAL | 0 refills | Status: AC | PRN

## 2021-09-27 MED ORDER — AMOXICILLIN 500 MG PO CAPS: 500.0000 mg | ORAL_CAPSULE | Freq: Three times a day (TID) | ORAL | 0 refills | Status: AC

## 2021-09-27 NOTE — Patient Instructions (Signed)
Sarah Chung, thank you for joining Abran Cantor, NP for today's virtual visit.  While this provider is not your primary care provider (PCP), if your PCP is located in our provider database this encounter information will be shared with them immediately following your visit.  Consent: (Patient) Sarah Chung provided verbal consent for this virtual visit at the beginning of the encounter.  Current Medications:  Current Outpatient Medications:    amoxicillin (AMOXIL) 500 MG capsule, Take 1 capsule (500 mg total) by mouth 3 (three) times daily for 10 days., Disp: 30 capsule, Rfl: 0   lidocaine (XYLOCAINE) 2 % solution, Use as directed 5 mLs in the mouth or throat every 6 (six) hours as needed for up to 5 days for mouth pain. Gargle and spit 5 mLs in the mouth or throat every 6 hours as needed for 5 days., Disp: 100 mL, Rfl: 0   atorvastatin (LIPITOR) 80 MG tablet, Take 1 tablet (80 mg total) by mouth daily., Disp: 90 tablet, Rfl: 0   ezetimibe (ZETIA) 10 MG tablet, TAKE 1 TABLET(10 MG) BY MOUTH DAILY, Disp: 30 tablet, Rfl: 11   PHENobarbital (LUMINAL) 64.8 MG tablet, TAKE 1 AND 1/2 TABLETS BY MOUTH EVERY NIGHT AT BEDTIME, Disp: 135 tablet, Rfl: 1   XARELTO 20 MG TABS tablet, TAKE 1 TABLET(20 MG) BY MOUTH DAILY WITH SUPPER, Disp: 30 tablet, Rfl: 12   Medications ordered in this encounter:  Meds ordered this encounter  Medications   amoxicillin (AMOXIL) 500 MG capsule    Sig: Take 1 capsule (500 mg total) by mouth 3 (three) times daily for 10 days.    Dispense:  30 capsule    Refill:  0   lidocaine (XYLOCAINE) 2 % solution    Sig: Use as directed 5 mLs in the mouth or throat every 6 (six) hours as needed for up to 5 days for mouth pain. Gargle and spit 5 mLs in the mouth or throat every 6 hours as needed for 5 days.    Dispense:  100 mL    Refill:  0     *If you need refills on other medications prior to your next appointment, please contact your pharmacy*  Follow-Up: Call back  or seek an in-person evaluation if the symptoms worsen or if the condition fails to improve as anticipated.  Other Instructions Will treat for strep based on recent exposure and patient reported cervical lymphadenopathy. Amoxicillin 500mg  prescribed twice daily for 10 days. Will also prescribe viscous lidocaine to help with throat pain. Encouraged use of Tylenol or Ibuprofen if needed, to be used as directed. Use cool or warm liquids for throat pain. Warm saltwater gargles as needed for throat pain. Change toothbrush in 3 days. Follow-up with PCP if symptoms do not improve.    If you have been instructed to have an in-person evaluation today at a local Urgent Care facility, please use the link below. It will take you to a list of all of our available [facility], including address, phone number and hours of operation. Please do not delay care.  [facility]  If you or a family member do not have a primary care provider, use the link below to schedule a visit and establish care. When you choose a [facility] primary care physician or advanced practice provider, you gain a long-term partner in health. Find a Primary Care Provider  Learn more about [facility]'s in-office and virtual care options: [facility] -  Get Care Now'

## 2021-09-27 NOTE — Progress Notes (Signed)
Virtual Visit Consent   Sarah Chung, you are scheduled for a virtual visit with a [facility] provider today.     Just as with appointments in the office, your consent must be obtained to participate.  Your consent will be active for this visit and any virtual visit you may have with one of our providers in the next 365 days.     If you have a MyChart account, a copy of this consent can be sent to you electronically.  All virtual visits are billed to your insurance company just like a traditional visit in the office.    As this is a virtual visit, video technology does not allow for your provider to perform a traditional examination.  This may limit your provider's ability to fully assess your condition.  If your provider identifies any concerns that need to be evaluated in person or the need to arrange testing (such as labs, EKG, etc.), we will make arrangements to do so.     Although advances in technology are sophisticated, we cannot ensure that it will always work on either your end or our end.  If the connection with a video visit is poor, the visit may have to be switched to a telephone visit.  With either a video or telephone visit, we are not always able to ensure that we have a secure connection.     I need to obtain your verbal consent now.   Are you willing to proceed with your visit today? yES   Sarah Chung has provided verbal consent on 09/27/2021 for a virtual visit (video or telephone).   Abran Cantor, NP   Date: 09/27/2021 11:54 AM   Virtual Visit via Video Note   I, Sarah Chung, connected with  Sarah Chung  ([id number], February 09, 1961) on 09/27/21 at 11:45 AM EST by a video-enabled telemedicine application and verified that I am speaking with the correct person using two identifiers.  Location: Patient: Virtual Visit Location Patient: Home Provider: Virtual Visit Location Provider: Home   I discussed the limitations of evaluation and management by  telemedicine and the availability of in person appointments. The patient expressed understanding and agreed to proceed.    History of Present Illness: Sarah Chung is a 61 y.o. who identifies as a female who was assigned female at birth, and is being seen today for sore throat and swollen glands in her neck.  HPI: Sore Throat  This is a new problem. The current episode started in the past 7 days. The problem has been gradually worsening. Sore throat worse side: bilateral. There has been no fever. The pain is moderate. Associated symptoms include neck pain and swollen glands. Pertinent negatives include no abdominal pain, ear discharge, shortness of breath, trouble swallowing or vomiting. She has had exposure to strep. Exposure to: she was around her daughter 2 days ago, daughter was diagnosed with strep one day ago. She has tried nothing for the symptoms.   Problems:  Patient Active Problem List   Diagnosis Date Noted   Chronic anticoagulation 06/22/2021   Seizure disorder (HCC) 08/27/2020   Mixed hyperlipidemia 06/20/2020   Obesity 06/20/2020   Nonobstructive atherosclerosis of coronary artery 04/20/2019   Pulmonary embolism (HCC) x2 02/09/2019   Calculus of gallbladder with acute cholecystitis, without mention of obstruction 07/03/2013    Allergies:  Allergies  Allergen Reactions   Tegretol [Carbamazepine] Hives   Keppra [Levetiracetam]     Feels outside her body.[name]  Ketorolac Tromethamine    Sulfa Antibiotics Hives   Medications:  Current Outpatient Medications:    atorvastatin (LIPITOR) 80 MG tablet, Take 1 tablet (80 mg total) by mouth daily., Disp: 90 tablet, Rfl: 0   ezetimibe (ZETIA) 10 MG tablet, TAKE 1 TABLET(10 MG) BY MOUTH DAILY, Disp: 30 tablet, Rfl: 11   PHENobarbital (LUMINAL) 64.8 MG tablet, TAKE 1 AND 1/2 TABLETS BY MOUTH EVERY NIGHT AT BEDTIME, Disp: 135 tablet, Rfl: 1   XARELTO 20 MG TABS tablet, TAKE 1 TABLET(20 MG) BY MOUTH DAILY WITH SUPPER, Disp: 30 tablet,  Rfl: 12  Observations/Objective: Patient is well-developed, well-nourished in no acute distress.  Resting comfortably at home.  Head is normocephalic, atraumatic.  No labored breathing.  Speech is clear and coherent with logical content.  Patient is alert and oriented at baseline.    Assessment and Plan: 1. Acute pharyngitis, unspecified etiology - amoxicillin (AMOXIL) 500 MG capsule; Take 1 capsule (500 mg total) by mouth 3 (three) times daily for 10 days.  Dispense: 30 capsule; Refill: 0 - lidocaine (XYLOCAINE) 2 % solution; Use as directed 5 mLs in the mouth or throat every 6 (six) hours as needed for up to 5 days for mouth pain. Gargle and spit 5 mLs in the mouth or throat every 6 hours as needed for 5 days.  Dispense: 100 mL; Refill: 0  Symptoms are consistent with acute pharnyngitis, difficulty to determine etiology based on virtual visit platform. Will treat for strep based on recent exposure and patient reported cervical lymphadenopathy. Amoxicillin 500mg  prescribed twice daily for 10 days. Will also prescribe viscous lidocaine to help with throat pain. Encouraged use of Tylenol or Ibuprofen if needed, to be used as directed. Use cool or warm liquids for throat pain. Warm saltwater gargles as needed for throat pain. Change toothbrush in 3 days. Follow-up with PCP if symptoms do not improve.    Follow Up Instructions: I discussed the assessment and treatment plan with the patient. The patient was provided an opportunity to ask questions and all were answered. The patient agreed with the plan and demonstrated an understanding of the instructions.  A copy of instructions were sent to the patient via MyChart unless otherwise noted below.   The patient was advised to call back or seek an in-person evaluation if the symptoms worsen or if the condition fails to improve as anticipated.  Time:  I spent 10 minutes with the patient via telehealth technology discussing the above  problems/concerns.    , NP

## 2021-10-27 NOTE — Progress Notes (Signed)
? ?Cardiology Office Note ? ? ?Date:  10/28/2021  ? ?ID:  Sarah Chung, DOB 1961/02/16, MRN [id number] ? ?PCP:  Leamon Arnt, MD  ?Cardiologist:   Dorris Carnes, MD  ? ? ? ?  ?History of Present Illness: ?Sarah Chung is a 61 y.o. 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  Was last seen in our office 08/2019 at which time she was drinking a lot of excessive caffeine and was asked to cut back.  Was also under a lot of stress. ?  ?Pt last had a televisit in Jan 2022    She says she  feels fine   No CP  No SOB  No palpitations   NO dizziness     ?She is active ? ?Diet:  Started weight loss program     Takes medicine to curb appetite Goal 175 ?Breakfast:  egg with velveta cheeze  Low carb wrap  Berniece Salines   Water  Coffee  Sweet and low  ?Lunch   Salad   Soup     6 in sub ?Chicken    ?Dinner   Freight forwarder     Cut back on pasta     water ?Snacks   Not a snacker     ? ? ? ?Current Meds  ?Medication Sig  ? atorvastatin (LIPITOR) 80 MG tablet Take 1 tablet (80 mg total) by mouth daily.  ? ezetimibe (ZETIA) 10 MG tablet TAKE 1 TABLET(10 MG) BY MOUTH DAILY  ? PHENobarbital (LUMINAL) 64.8 MG tablet TAKE 1 AND 1/2 TABLETS BY MOUTH EVERY NIGHT AT BEDTIME  ? XARELTO 20 MG TABS tablet TAKE 1 TABLET(20 MG) BY MOUTH DAILY WITH SUPPER  ? ? ? ?Allergies:   Tegretol [carbamazepine], Keppra [levetiracetam], Ketorolac tromethamine, and Sulfa antibiotics  ? ?Past Medical History:  ?Diagnosis Date  ? Allergy   ? Clotting disorder ([place])   ? DVT (deep vein thrombosis) in pregnancy   ? Gallstones   ? Hyperlipidemia   ? Pulmonary embolism ([place])   ? Seizures ([place])   ? ? ?Past Surgical History:  ?Procedure Laterality Date  ? CHOLECYSTECTOMY N/A 07/02/2013  ? Procedure: LAPAROSCOPIC CHOLECYSTECTOMY WITH INTRAOPERATIVE CHOLANGIOGRAM;  Surgeon:  Adin Hector, MD;  Location: [facility];  Service: General;  Laterality: N/A;  ? LEFT HEART CATH AND CORONARY ANGIOGRAPHY N/A 04/20/2019  ? Procedure: LEFT HEART CATH AND CORONARY ANGIOGRAPHY;  Surgeon: Belva Crome, MD;  Location: [facility];  Service: Cardiovascular;  Laterality: N/A;  ? ? ? ?Social History:  The patient  reports that she has never smoked. She has never used smokeless tobacco. She reports that she does not drink alcohol and does not use drugs.  ? ?Family History:  The patient's family history includes Depression in her sister; Berenice Primas' disease in her mother; Heart attack in her brother, father, and paternal grandmother; Heart disease in her brother and father; Kidney disease in her maternal grandfather; Mental illness in her mother; Parkinson's disease in her father.  ? ? ?ROS:  Please see the history of present illness. All other systems are reviewed and  Negative to the above problem except as noted.  ? ? ?PHYSICAL EXAM: ?VS:  BP 136/82   Pulse 67   Ht 5' 4.5" (1.638 m)   Wt 200  lb (90.7 kg)   SpO2 97%   BMI 33.80 kg/m?   ?GEN: Obese 61 yo , in no acute distress  ?HEENT: normal ?Neck: no JVD, carotid bruits ?Cardiac: RRR; no murmurs,  No LE  edema  ?Respiratory:  clear to auscultation bilaterally ?GI: soft, nontender, nondistended, + BS  No hepatomegaly  ?MS: no deformity Moving all extremities   ?Skin: warm and dry, no rash ?Neuro:  Strength and sensation are intact ?Psych: euthymic mood, full affect ? ? ?EKG:  EKG is ordered today.  SR  65  LVH   ? ? ?Cath   04/2019 ? ?Mild to moderate LAD, circumflex, and right coronary calcification. ?25 to 30% proximal to mid LAD. ?40 to 50% eccentric proximal to mid circumflex. ?30% mid RCA. ?Normal LV function.  LVEDP is normal. ?  ?RECOMMENDATIONS: ?  ?Optimal medical therapy for nonobstructive coronary atherosclerosis: Lifestyle modifications as needed for heart health, aggressive lipid-lowering, assessment of glycemic control, blood  pressure control. ?In context of clinical symptoms, intervention is not appropriate.  FFR in circumflex is borderline significant.  This should be followed clinically over time with PCI to be considered if symptoms not controllable with medical therapy. ?Echo 2020 ? ? ? 1. The left ventricle has normal systolic function with an ejection  ?fraction of 60-65%. The cavity size was normal. There is mildly increased  ?left ventricular wall thickness. Left ventricular diastolic Doppler  ?parameters are consistent with impaired  ?relaxation. Indeterminate filling pressures The E/e' is 8-15. No evidence  ?of left ventricular regional wall motion abnormalities.  ? 2. The mitral valve is grossly normal.  ? 3. The tricuspid valve is grossly normal.  ? 4. The aortic valve is tricuspid. Aortic valve regurgitation is trivial  ?by color flow Doppler. No stenosis of the aortic valve.  ? 5. The inferior vena cava was dilated in size with <50% respiratory  ?variability.  ? ?SUMMARY  ?   ?LVEF 60-65%, mild LVH, normal wall motion, grade 1 DD, indeterminate  ?LV filling pressure, low normal RVEF (34% FAC, TAPSE 1.22, RV s' 9.5  ?cm/sec), dilated IVC that does not collapse  ? FINDINGS  ? ? ?Lipid Panel ?   ?Component Value Date/Time  ? CHOL 141 06/22/2021 1151  ? TRIG 65.0 06/22/2021 1151  ? HDL 58.50 06/22/2021 1151  ? CHOLHDL 2 06/22/2021 1151  ? VLDL 13.0 06/22/2021 1151  ? LDLCALC 69 06/22/2021 1151  ? [place] 99 06/24/2020 1353  ? ?  ? ?Wt Readings from Last 3 Encounters:  ?10/28/21 200 lb (90.7 kg)  ?09/15/21 201 lb 12.8 oz (91.5 kg)  ?08/27/21 205 lb (93 kg)  ?  ? ? ?ASSESSMENT AND PLAN: ? ?1  CAD  Mild by cath in 2020    No symptoms of angina   Keep on same regimen   ? ?2   HL    Lipids   are very good  ? ?3  BP  BP Fair  136  Recomm she get BP at home   Call if high  ? ?4  Diet   Discussed low carb, low added sugar   Increase veggies    ? ? ?F/U in 1 year    ? ?Current medicines are reviewed at length with the patient today.   The patient does not have concerns regarding medicines. ? ?Signed, ?Dorris Carnes, MD  ?10/28/2021 2:59 PM    ?[facility] ?[address], [place], [place]  [address] ?Phone: [phone];  Fax: [phone]  ? ? ?

## 2021-10-28 ENCOUNTER — Ambulatory Visit (INDEPENDENT_AMBULATORY_CARE_PROVIDER_SITE_OTHER): Payer: Self-pay | Admitting: Internal Medicine

## 2021-10-28 ENCOUNTER — Encounter: Payer: Self-pay | Admitting: Internal Medicine

## 2021-10-28 ENCOUNTER — Other Ambulatory Visit: Payer: Self-pay

## 2021-10-28 VITALS — BP 136/82 | HR 67 | Ht 64.5 in | Wt 200.0 lb

## 2021-10-28 DIAGNOSIS — I2692 Saddle embolus of pulmonary artery without acute cor pulmonale: Secondary | ICD-10-CM

## 2021-10-28 DIAGNOSIS — I10 Essential (primary) hypertension: Secondary | ICD-10-CM

## 2021-10-28 MED ORDER — ATORVASTATIN CALCIUM 80 MG PO TABS: 80.0000 mg | ORAL_TABLET | Freq: Every day | ORAL | 3 refills | Status: DC

## 2021-10-28 MED ORDER — EZETIMIBE 10 MG PO TABS: ORAL_TABLET | ORAL | 11 refills | Status: DC

## 2021-10-28 MED ORDER — XARELTO 20 MG PO TABS: ORAL_TABLET | ORAL | 12 refills | Status: DC

## 2021-10-28 NOTE — Patient Instructions (Signed)
Medication Instructions:  Your physician recommends that you continue on your current medications as directed. Please refer to the Current Medication list given to you today.  *If you need a refill on your cardiac medications before your next appointment, please call your pharmacy*   Lab Work: NONE  If you have labs (blood work) drawn today and your tests are completely normal, you will receive your results only by: MyChart Message (if you have MyChart) OR A paper copy in the mail If you have any lab test that is abnormal or we need to change your treatment, we will call you to review the results.   Testing/Procedures: NONE    Follow-Up: At [facility], you and your health needs are our priority.  As part of our continuing mission to provide you with exceptional heart care, we have created designated Provider Care Teams.  These Care Teams include your primary Cardiologist (physician) and Advanced Practice Providers (APPs -  Physician Assistants and Nurse Practitioners) who all work together to provide you with the care you need, when you need it.  We recommend signing up for the patient portal called "MyChart".  Sign up information is provided on this After Visit Summary.  MyChart is used to connect with patients for Virtual Visits (Telemedicine).  Patients are able to view lab/test results, encounter notes, upcoming appointments, etc.  Non-urgent messages can be sent to your provider as well.   To learn more about what you can do with MyChart, go to [email or web address].    Your next appointment:   1 year(s)  The format for your next appointment:   In Person  Provider:   Paula Ross, MD   Other Instructions Thank you for choosing [facility]!    

## 2021-11-03 LAB — HM PAP SMEAR

## 2021-12-10 ENCOUNTER — Encounter: Payer: Self-pay | Admitting: Family Medicine

## 2021-12-10 ENCOUNTER — Ambulatory Visit (INDEPENDENT_AMBULATORY_CARE_PROVIDER_SITE_OTHER): Payer: Self-pay | Admitting: Family Medicine

## 2021-12-10 VITALS — BP 130/62 | HR 61 | Temp 98.0°F | Ht 64.5 in | Wt 201.0 lb

## 2021-12-10 DIAGNOSIS — F321 Major depressive disorder, single episode, moderate: Secondary | ICD-10-CM

## 2021-12-10 DIAGNOSIS — E6609 Other obesity due to excess calories: Secondary | ICD-10-CM

## 2021-12-10 DIAGNOSIS — Z6833 Body mass index (BMI) 33.0-33.9, adult: Secondary | ICD-10-CM

## 2021-12-10 MED ORDER — ESCITALOPRAM OXALATE 10 MG PO TABS: 10.0000 mg | ORAL_TABLET | Freq: Every day | ORAL | 2 refills | Status: DC

## 2021-12-10 NOTE — Patient Instructions (Signed)
Please return in 6 weeks for recheck mood ? ?Seek out a therapist; this is important.  ? ?If you have any questions or concerns, please don't hesitate to send me a message via MyChart or call the office at [phone]. Thank you for visiting with [place] today! It's our pleasure caring for you.  ? ?Major Depressive Disorder, Adult ?Major depressive disorder (MDD) is a mental health condition. It may also be called clinical depression or unipolar depression. MDD causes symptoms of sadness, hopelessness, and loss of interest in things. These symptoms last most of the day, almost every day, for 2 weeks. MDD can also cause physical symptoms. It can interfere with relationships and with everyday activities, such as work, school, and activities that are usually pleasant. ?MDD may be mild, moderate, or severe. It may be single-episode MDD, which happens once, or recurrent MDD, which may occur multiple times. ?What are the causes? ?The exact cause of this condition is not known. MDD is most likely caused by a combination of things, which may include: ?Your personality traits. ?Learned or conditioned behaviors or thoughts or feelings that reinforce negativity. ?Any alcohol or substance misuse. ?Long-term (chronic) physical or mental health illness. ?Going through a traumatic experience or major life changes. ?What increases the risk? ?The following factors may make someone more likely to develop MDD: ?A family history of depression. ?Being a woman. ?Troubled family relationships. ?Abnormally low levels of certain brain chemicals. ?Traumatic or painful events in childhood, especially abuse or loss of a parent. ?A lot of stress from life experiences, such as poor living conditions or discrimination. ?Chronic physical illness or other mental health disorders. ?What are the signs or symptoms? ?The main symptoms of MDD usually include: ?Constant depressed or irritable mood. ?A loss of interest in things and activities. ?Other  symptoms include: ?Sleeping or eating too much or too little. ?Unexplained weight gain or weight loss. ?Tiredness or low energy. ?Being agitated, restless, or weak. ?Feeling hopeless, worthless, or guilty. ?Trouble thinking clearly or making decisions. ?Thoughts of suicide or thoughts of harming others. ?Isolating oneself or avoiding other people or activities. ?Trouble completing tasks, work, or any normal obligations. ?Severe symptoms of this condition may include: ?Psychotic depression.This may include false beliefs, or delusions. It may also include seeing, hearing, tasting, smelling, or feeling things that are not real (hallucinations). ?Chronic depression or persistent depressive disorder. This is low-level depression that lasts for at least 2 years. ?Melancholic depression, or feeling extremely sad and hopeless. ?Catatonic depression, which includes trouble speaking and trouble moving. ?How is this diagnosed? ?This condition may be diagnosed based on: ?Your symptoms. ?Your medical and mental health history. You may be asked questions about your lifestyle, including any drug and alcohol use. ?A physical exam. ?Blood tests to rule out other conditions. ?MDD is confirmed if you have the following symptoms most of the day, nearly every day, in a 2-week period: ?Either a depressed mood or loss of interest. ?At least four other MDD symptoms. ?How is this treated? ?This condition is usually treated by mental health professionals, such as psychologists, psychiatrists, and clinical social workers. You may need more than one type of treatment. Treatment may include: ?Psychotherapy, also called talk therapy or counseling. Types of psychotherapy include: ?Cognitive behavioral therapy (CBT). This teaches you to recognize unhealthy feelings, thoughts, and behaviors, and replace them with positive thoughts and actions. ?Interpersonal therapy (IPT). This helps you to improve the way you communicate with others or relate to  them. ?Family therapy. This treatment  includes members of your family. ?Medicines to treat anxiety and depression. These medicines help to balance the brain chemicals that affect your emotions. ?Lifestyle changes. You may be asked to: ?Limit alcohol use and avoid drug use. ?Get regular exercise. ?Get plenty of sleep. ?Make healthy eating choices. ?Spend more time outdoors. ?Brain stimulation. This may be done if symptoms are very severe and other treatments have not worked. Examples of this treatment are electroconvulsive therapy and transcranial magnetic stimulation. ?Follow these instructions at home: ?Activity ?Exercise regularly and spend time outdoors. ?Find activities that you enjoy doing, and make time to do them. ?Find healthy ways to manage stress, such as: ?Meditation or deep breathing. ?Spending time in nature. ?Journaling. ?Return to your normal activities as told by your health care provider. Ask your health care provider what activities are safe for you. ?Alcohol and drug use ?If you drink alcohol: ?Limit how much you use to: ?0-1 drink a day for women who are not pregnant. ?0-2 drinks a day for men. ?Be aware of how much alcohol is in your drink. In the [place] one drink equals one 12 oz bottle of beer (355 mL), one 5 oz glass of wine (148 mL), or one 1? oz glass of hard liquor (44 mL). ?Discuss your alcohol use with your health care provider. Alcohol can affect any antidepressant medicines you are taking. ?Discuss any drug use with your health care provider. ?General instructions ? ?Take over-the-counter and prescription medicines only as told by your health care provider. ?Eat a healthy diet and get plenty of sleep. ?Consider joining a support group. Your health care provider may be able to recommend one. ?Keep all follow-up visits as told by your health care provider. This is important. ?Where to find more information ?National Alliance on Mental Illness: www.nami.org ?[employer] of  Mental Health: https://carter.com/ ?Contact a health care provider if: ?Your symptoms get worse. ?You develop new symptoms. ?Get help right away if: ?You self-harm. ?You have serious thoughts about hurting yourself or others. ?You hallucinate. ?If you ever feel like you may hurt yourself or others, or have thoughts about taking your own life, get help right away. Go to your nearest emergency department or: ?Call your local emergency services (911 in the [place] ?Call a suicide crisis helpline, such as the [place] at 5121171849 or 988 in the [place]. This is open 24 hours a day in the [place] ?Text the Crisis Text Line at 201-150-5020 (in the [place].). ?Summary ?Major depressive disorder (MDD) is a mental health condition. MDD causes symptoms of sadness, hopelessness, and loss of interest in things. These symptoms last most of the day, almost every day, for 2 weeks. ?The symptoms of MDD can interfere with relationships and with everyday activities. ?Treatments and support are available for people who develop MDD. You may need more than one type of treatment. ?Get help right away if you have serious thoughts about hurting yourself or others. ?This information is not intended to replace advice given to you by your health care provider. Make sure you discuss any questions you have with your health care provider. ?Document Revised: 02/18/2021 Document Reviewed: 07/07/2019 ?Elsevier Patient Education ? [place]. ? ?

## 2021-12-10 NOTE — Progress Notes (Signed)
? ?Subjective  ?CC:  ?Chief Complaint  ?Patient presents with  ? Depression  ? Anxiety  ?  Pt stated that she has had some lifestyle changes and she may need some medication to help her cope.  ? ? ?HPI: Sarah Chung is a 61 y.o. female who presents to the office today to address the problems listed above in the chief complaint, mood problems. ?Patient presents with her daughter who is worried about her mood.  Multiple life stressors.  Divorce after 34 years of marriage was finalized in April.  Still enmeshed with her ex-husband's life.  "I do everything for him".  Tearful daily and stressed.  Lots of anger.  Hypersomnolent but reports good motivation and when she is out with her friends she enjoys it.  Still highly functional at work.  Some emotional eating. She fears taking medicine.  She has been to counseling but feels it is very expensive. ? ?  06/22/2021  ? 12:01 PM 06/20/2020  ?  1:13 PM 06/12/2015  ?  9:53 AM  ?Depression screen PHQ 2/9  ?Decreased Interest 0 1 1  ?Down, Depressed, Hopeless 1 1 1   ?PHQ - 2 Score 1 2 2   ?Altered sleeping 1 2 0  ?Tired, decreased energy 0 1 1  ?Change in appetite 1 1 1   ?Feeling bad or failure about yourself  1    ?Trouble concentrating 0 0 2  ?Moving slowly or fidgety/restless 0 0 0  ?Suicidal thoughts 0 0 0  ?PHQ-9 Score 4 6 6   ?Difficult doing work/chores Not difficult at all Not difficult at all Somewhat difficult  ? ? ?  12/10/2021  ?  9:33 AM 06/22/2021  ? 12:01 PM  ?GAD 7 : Generalized Anxiety Score  ?Nervous, Anxious, on Edge 1 1  ?Control/stop worrying 1 0  ?Worry too much - different things 2 2  ?Trouble relaxing 0 0  ?Restless 0 0  ?Easily annoyed or irritable 0 0  ?Afraid - awful might happen 0 1  ?Total GAD 7 Score 4 4  ?Anxiety Difficulty Somewhat difficult Not difficult at all  ? ?Obesity: Also is frustrated with lack of weight loss.  Reports she started [facility] through a weight loss clinic and continue several months ago.  Now the 7.5 mg weekly dose.  However  weight is stable ?Wt Readings from Last 3 Encounters:  ?12/10/21 201 lb (91.2 kg)  ?10/28/21 200 lb (90.7 kg)  ?09/15/21 201 lb 12.8 oz (91.5 kg)  ? ? ? ?Assessment  ?1. Depression, major, single episode, moderate ([place])   ?2. Class 1 obesity due to excess calories with serious comorbidity and body mass index (BMI) of 33.0 to 33.9 in adult   ? ?  ?Plan  ?Depression: Adjustment related possible history of chronic depression.  Counseling done.  Strongly recommend therapy to help her work through her divorce.  Start Lexapro 10.  Close follow-up. ?Reviewed concept of mood problems caused by biochemical imbalance of neurotransmitters and rationale for treatment with medications and therapy.  ?Counseling given: pt was instructed to contact office, on-call physician or crisis Hotline if symptoms worsen significantly. If patient develops any suicidal or homicidal thoughts, she is directed to the ER immediately.  ?Obesity: Recommend holding off on Mounjaro for now.  Difficult time to work on weight loss.  We will readdress this in the future. ?Follow up: 6 weeks to recheck mood ?No orders of the defined types were placed in this encounter. ? ?Meds ordered this encounter  ?  Medications  ? escitalopram (LEXAPRO) 10 MG tablet  ?  Sig: Take 1 tablet (10 mg total) by mouth daily.  ?  Dispense:  30 tablet  ?  Refill:  2  ? ?  ? ?I reviewed the patients updated PMH, FH, and SocHx.  ?  ?Patient Active Problem List  ? Diagnosis Date Noted  ? Chronic anticoagulation 06/22/2021  ? Seizure disorder ([place]) 08/27/2020  ? Mixed hyperlipidemia 06/20/2020  ? Obesity 06/20/2020  ? Nonobstructive atherosclerosis of coronary artery 04/20/2019  ? Pulmonary embolism ([place]) x2 02/09/2019  ? ?Current Meds  ?Medication Sig  ? atorvastatin (LIPITOR) 80 MG tablet Take 1 tablet (80 mg total) by mouth daily.  ? escitalopram (LEXAPRO) 10 MG tablet Take 1 tablet (10 mg total) by mouth daily.  ? ezetimibe (ZETIA) 10 MG tablet TAKE 1 TABLET(10 MG) BY MOUTH  DAILY  ? PHENobarbital (LUMINAL) 64.8 MG tablet TAKE 1 AND 1/2 TABLETS BY MOUTH EVERY NIGHT AT BEDTIME  ? XARELTO 20 MG TABS tablet TAKE 1 TABLET(20 MG) BY MOUTH DAILY WITH SUPPER  ? ? ?Allergies: ?Patient is allergic to tegretol [carbamazepine], keppra [levetiracetam], ketorolac tromethamine, and sulfa antibiotics. ?Family history:  ?Patient family history includes Depression in her sister; Berenice Primas' disease in her mother; Heart attack in her brother, father, and paternal grandmother; Heart disease in her brother and father; Kidney disease in her maternal grandfather; Mental illness in her mother; Parkinson's disease in her father. ?Social History  ? ?Socioeconomic History  ? Marital status: Legally Separated  ?  Spouse name: Octavia Bruckner  ? Number of children: 2  ? Years of education: College  ? Highest education level: Not on file  ?Occupational History  ? Occupation: Marketing executive  ?  Employer: [employer]  ?Tobacco Use  ? Smoking status: Never  ? Smokeless tobacco: Never  ?Vaping Use  ? Vaping Use: Never used  ?Substance and Sexual Activity  ? Alcohol use: No  ? Drug use: No  ? Sexual activity: Yes  ?  Birth control/protection: Post-menopausal  ?Other Topics Concern  ? Not on file  ?Social History Narrative  ? Patient is married Research scientist (life sciences)) and lives at home with his husband.  ? Patient has two adult children.  ? Patient is working full-time.  ? Patient has a college education.  ? Patient is right-handed.  ? Patient drinks two cups of coffee and some soda.  ? ?Social Determinants of Health  ? ?Financial Resource Strain: Not on file  ?Food Insecurity: Not on file  ?Transportation Needs: Not on file  ?Physical Activity: Not on file  ?Stress: Not on file  ?Social Connections: Not on file  ? ? ? ?Review of Systems: ?Constitutional: Negative for fever malaise or anorexia ?Cardiovascular: negative for chest pain ?Respiratory: negative for SOB or persistent cough ?Gastrointestinal: negative for abdominal pain ? ?Objective   ?Vitals: BP 130/62   Pulse 61   Temp 98 ?F (36.7 ?C)   Ht 5' 4.5" (1.638 m)   Wt 201 lb (91.2 kg)   SpO2 98%   BMI 33.97 kg/m?  ?General: no acute distress, well appearing, no apparent distress, well groomed ?Psych:  Alert and oriented x 3,anxious. tearful ? ?Commons side effects, risks, benefits, and alternatives for medications and treatment plan prescribed today were discussed, and the patient expressed understanding of the given instructions. Patient is instructed to call or message via MyChart if he/she has any questions or concerns regarding our treatment plan. No barriers to understanding were identified. We discussed [place] symptoms and signs in detail. Patient expressed understanding regarding what to do in case of urgent or emergency type symptoms.  ?Medication list was reconciled, printed and provided to the patient in AVS. Patient instructions and summary information was reviewed with the patient as documented in the AVS. ?This note was prepared with assistance of Systems analyst. Occasional wrong-word or sound-a-like substitutions may have occurred due to the inherent limitations of voice recognition software ? ? ? ? ?

## 2022-02-08 ENCOUNTER — Telehealth: Payer: Self-pay | Admitting: Neurology

## 2022-02-08 ENCOUNTER — Telehealth: Payer: Self-pay | Admitting: *Deleted

## 2022-02-08 DIAGNOSIS — Z5320 Procedure and treatment not carried out because of patient's decision for unspecified reasons: Secondary | ICD-10-CM

## 2022-02-08 MED ORDER — PHENOBARBITAL 64.8 MG PO TABS: 97.2000 mg | ORAL_TABLET | Freq: Every day | ORAL | 0 refills | Status: DC

## 2022-02-08 NOTE — Telephone Encounter (Signed)
Pt is wanting to confirm that the PHENobarbital  is being called into the [facility] [phone]

## 2022-02-08 NOTE — Telephone Encounter (Signed)
Pt is out of town and left her PHENobarbital (LUMINAL) 64.8 MG tablet at home. Pt would like to know if you could call  a three day supply into [facility] in [place] on [address] in [place]. Pt would like a call from nurse to know if the prescription will be sent

## 2022-02-08 NOTE — Telephone Encounter (Signed)
Reviewed pt chart. Last seen 08/27/21 and next f/u 08/30/22.  Reviewed drug registry. Last refilled 02/05/22 #135.  Sent refill request for temp supply to MD to e-scribe.

## 2022-03-01 ENCOUNTER — Ambulatory Visit (INDEPENDENT_AMBULATORY_CARE_PROVIDER_SITE_OTHER): Payer: Self-pay

## 2022-03-01 ENCOUNTER — Ambulatory Visit (INDEPENDENT_AMBULATORY_CARE_PROVIDER_SITE_OTHER): Payer: Self-pay | Admitting: Family Medicine

## 2022-03-01 VITALS — BP 150/88 | HR 66 | Ht 64.5 in | Wt 197.0 lb

## 2022-03-01 DIAGNOSIS — M79642 Pain in left hand: Secondary | ICD-10-CM

## 2022-03-01 DIAGNOSIS — R2232 Localized swelling, mass and lump, left upper limb: Secondary | ICD-10-CM

## 2022-03-01 NOTE — Patient Instructions (Addendum)
Thank you for coming in today.   Please get an Xray today before you leave   Expect a call about scheduling the vascular ultrasound  Check back in 2 weeks.

## 2022-03-01 NOTE — Progress Notes (Signed)
   I, Philbert Riser, LAT, ATC acting as a scribe for Clementeen Graham, MD.  Subjective:    CC: L 4th finger  HPI: Pt is a 61 y/o female c/o L 4th finger pain ongoing for 2 weeks when she suffered a fall, coming up the basement steps, jamming her L hand on the wall. Of note, pt has a hx of blood clots. Pt locates pain to L 4th finger and bump along the anterior aspect of the distal forearm she's developed a lump.   She is concerned and worried about the potential for DVT.  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  She currently takes Xarelto.  L hand swelling: yes- along distal forearm.  Aggravates: movement Treatments tried: none   Pertinent review of Systems: No fevers or chills  Relevant historical information: History of life-threatening saddle pulmonary embolus unprovoked.   Objective:    Vitals:   03/01/22 1331  BP: (!) 150/88  Pulse: 66  SpO2: 97%   General: Well Developed, well nourished, and in no acute distress.   MSK: Left hand: Normal-appearing Tender palpation at fourth PIP.  Normal hand motion.  Intact strength however some pain is present with extension of PIP. Small soft mass is present at the distal left forearm.  Nontender.  Normal wrist and hand motion.  Lab and Radiology Results  X-ray images left hand obtained today personally and independently interpreted. No acute fractures are present. Await formal radiology review   Impression and Recommendations:    Assessment and Plan: 61 y.o. female with left finger injury.  Strain versus small fracture not seen on x-ray per my read.  Plan for buddy tape and await formal radiology over read.  Voltaren gel should be helpful as well.  Left forearm mass: Superficial thrombophlebitis is a possibility.  Plan for vascular ultrasound.  Recheck in 2 to 4 weeks.Marland Kitchen  PDMP not reviewed this encounter. Orders Placed This Encounter  Procedures   DG Hand Complete Left    Standing Status:    Future    Number of Occurrences:   1    Standing Expiration Date:   04/01/2022    Order Specific Question:   Reason for Exam (SYMPTOM  OR DIAGNOSIS REQUIRED)    Answer:   left hand pain    Order Specific Question:   Preferred imaging location?    Answer:   [facility]   No orders of the defined types were placed in this encounter.   Discussed warning signs or symptoms. Please see discharge instructions. Patient expresses understanding.   The above documentation has been reviewed and is accurate and complete Clementeen Graham, M.D.

## 2022-03-02 ENCOUNTER — Ambulatory Visit (INDEPENDENT_AMBULATORY_CARE_PROVIDER_SITE_OTHER): Payer: Self-pay

## 2022-03-02 ENCOUNTER — Other Ambulatory Visit: Payer: Self-pay | Admitting: Family Medicine

## 2022-03-02 DIAGNOSIS — M79602 Pain in left arm: Secondary | ICD-10-CM

## 2022-03-02 DIAGNOSIS — M7989 Other specified soft tissue disorders: Secondary | ICD-10-CM

## 2022-03-03 NOTE — Progress Notes (Signed)
Left upper arm shows no evidence of DVT.

## 2022-03-03 NOTE — Progress Notes (Signed)
Left hand x-ray shows mild arthritis.  No fractures are seen.

## 2022-03-04 ENCOUNTER — Other Ambulatory Visit: Payer: Self-pay

## 2022-03-04 ENCOUNTER — Emergency Department (HOSPITAL_BASED_OUTPATIENT_CLINIC_OR_DEPARTMENT_OTHER): Payer: Self-pay | Admitting: Radiology

## 2022-03-04 ENCOUNTER — Encounter (HOSPITAL_BASED_OUTPATIENT_CLINIC_OR_DEPARTMENT_OTHER): Payer: Self-pay | Admitting: Emergency Medicine

## 2022-03-04 ENCOUNTER — Emergency Department (HOSPITAL_BASED_OUTPATIENT_CLINIC_OR_DEPARTMENT_OTHER)
Admission: EM | Admit: 2022-03-04 | Discharge: 2022-03-05 | Disposition: A | Discharge status: Home / Self Care | Payer: Self-pay | Attending: Emergency Medicine | Admitting: Emergency Medicine

## 2022-03-04 DIAGNOSIS — W108XXA Fall (on) (from) other stairs and steps, initial encounter: Secondary | ICD-10-CM | POA: Insufficient documentation

## 2022-03-04 DIAGNOSIS — W19XXXA Unspecified fall, initial encounter: Secondary | ICD-10-CM

## 2022-03-04 DIAGNOSIS — S52511A Displaced fracture of right radial styloid process, initial encounter for closed fracture: Secondary | ICD-10-CM | POA: Insufficient documentation

## 2022-03-04 DIAGNOSIS — Z794 Long term (current) use of insulin: Secondary | ICD-10-CM | POA: Insufficient documentation

## 2022-03-04 DIAGNOSIS — S20219A Contusion of unspecified front wall of thorax, initial encounter: Secondary | ICD-10-CM | POA: Insufficient documentation

## 2022-03-04 DIAGNOSIS — Z86711 Personal history of pulmonary embolism: Secondary | ICD-10-CM | POA: Insufficient documentation

## 2022-03-04 DIAGNOSIS — S62101A Fracture of unspecified carpal bone, right wrist, initial encounter for closed fracture: Secondary | ICD-10-CM

## 2022-03-04 DIAGNOSIS — Z7901 Long term (current) use of anticoagulants: Secondary | ICD-10-CM | POA: Insufficient documentation

## 2022-03-04 MED ORDER — HYDROCODONE-ACETAMINOPHEN 5-325 MG PO TABS
0.5000 | ORAL_TABLET | Freq: Once | ORAL | Status: AC
  Administered 2022-03-04: 0.5 via ORAL
  Filled 2022-03-04: qty 1

## 2022-03-04 NOTE — ED Provider Notes (Signed)
[facility] EMERGENCY DEPT  Provider Note  CSN: [id number] Arrival date & time: 03/04/22 2032  History Chief Complaint  Patient presents with   Sarah Chung is a 61 y.o. female with history of PE on chronic anticoagulation reports she stumbled and fell going up the stairs earlier this evening landing on outstretched R hand and hitting her sternum. She denies head injury. She is complaining of mid-chest and R wrist pain.    Home Medications Prior to Admission medications   Medication Sig Start Date End Date Taking? Authorizing Provider  HYDROcodone-acetaminophen (NORCO/VICODIN) 5-325 MG tablet Take 0.5-1 tablets by mouth every 6 (six) hours as needed for severe pain. 03/05/22  Yes Pollyann Savoy, MD  atorvastatin (LIPITOR) 80 MG tablet Take 1 tablet (80 mg total) by mouth daily. 10/28/21 01/26/22  Pricilla Riffle, MD  escitalopram (LEXAPRO) 10 MG tablet Take 1 tablet (10 mg total) by mouth daily. 12/10/21   Willow Ora, MD  ezetimibe (ZETIA) 10 MG tablet TAKE 1 TABLET(10 MG) BY MOUTH DAILY 10/28/21   Pricilla Riffle, MD  PHENobarbital (LUMINAL) 64.8 MG tablet TAKE 1 AND 1/2 TABLETS BY MOUTH EVERY NIGHT AT BEDTIME 08/27/21   Dohmeier, Porfirio Mylar, MD  PHENobarbital (LUMINAL) 64.8 MG tablet Take 1.5 tablets (97.2 mg total) by mouth at bedtime. 02/08/22   Dohmeier, Porfirio Mylar, MD  Semaglutide,0.25 or 0.5MG /DOS, (OZEMPIC, 0.25 OR 0.5 MG/DOSE,) 2 MG/1.5ML SOPN Inject into the skin.    [provider]  XARELTO 20 MG TABS tablet TAKE 1 TABLET(20 MG) BY MOUTH DAILY WITH SUPPER 10/28/21   Pricilla Riffle, MD     Allergies    Tegretol [carbamazepine], Keppra [levetiracetam], Ketorolac tromethamine, and Sulfa antibiotics   Review of Systems   Review of Systems Please see HPI for pertinent positives and negatives  Physical Exam BP 140/83   Pulse 65   Temp 98.1 F (36.7 C)   Resp 18   Ht 5' 4.5" (1.638 m)   Wt 87.5 kg   SpO2 100%   BMI 32.62 kg/m   Physical  Exam Vitals and nursing note reviewed.  Constitutional:      Appearance: Normal appearance.  HENT:     Head: Normocephalic and atraumatic.     Nose: Nose normal.     Mouth/Throat:     Mouth: Mucous membranes are moist.  Eyes:     Extraocular Movements: Extraocular movements intact.     Conjunctiva/sclera: Conjunctivae normal.  Cardiovascular:     Rate and Rhythm: Normal rate.  Pulmonary:     Effort: Pulmonary effort is normal.     Breath sounds: Normal breath sounds.  Chest:     Chest wall: Tenderness (midsternal) present.  Abdominal:     General: Abdomen is flat.     Palpations: Abdomen is soft.     Tenderness: There is no abdominal tenderness.  Musculoskeletal:        General: Tenderness (R radial wrist) present. No swelling. Normal range of motion.     Cervical back: Neck supple.  Skin:    General: Skin is warm and dry.  Neurological:     General: No focal deficit present.     Mental Status: She is alert.  Psychiatric:        Mood and Affect: Mood normal.     ED Results / Procedures / Treatments   EKG None  Procedures Procedures  Medications Ordered in the ED Medications  HYDROcodone-acetaminophen (NORCO/VICODIN) 5-325 MG per tablet  0.5 tablet (0.5 tablets Oral Given 03/04/22 2336)    Initial Impression and Plan  Patient here after mechanical fall. Has sternal pain and R wrist pain. I personally viewed the images from radiology studies and agree with radiologist interpretation: Rib xrays done in triage neg for rib fracture but do not image her sternum well, will send back for lateral view. Wrist xray shows a radius fracture. Will place in wrist splint, norco for pain.   ED Course   Clinical Course as of 03/05/22 0028  Fri Mar 05, 2022  0025 I personally viewed the images from radiology studies and agree with radiologist interpretation: Lateral xray is neg for sternal fracture. Plan discharge with wrist splint, Hand follow up, norco for pain.   [CS]     Clinical Course User Index [CS] Pollyann Savoy, MD     MDM Rules/Calculators/A&P Medical Decision Making Problems Addressed: Closed fracture of right wrist, initial encounter: acute illness or injury Contusion of chest wall, unspecified laterality, initial encounter: acute illness or injury Fall, initial encounter: acute illness or injury  Amount and/or Complexity of Data Reviewed Radiology: ordered and independent interpretation performed. Decision-making details documented in ED Course.  Risk Prescription drug management.    Final Clinical Impression(s) / ED Diagnoses Final diagnoses:  Fall, initial encounter  Closed fracture of right wrist, initial encounter  Contusion of chest wall, unspecified laterality, initial encounter    Rx / DC Orders ED Discharge Orders          Ordered    HYDROcodone-acetaminophen (NORCO/VICODIN) 5-325 MG tablet  Every 6 hours PRN        03/05/22 0027             Pollyann Savoy, MD 03/05/22 0028

## 2022-03-04 NOTE — ED Triage Notes (Addendum)
Pt c/o falling tonight and hurting her right chest and right wrist. Pt states that it hurts to breathe in. Pt is on a blood thinner, but did not hit her head.

## 2022-03-05 MED ORDER — HYDROCODONE-ACETAMINOPHEN 5-325 MG PO TABS: 0.5000 | ORAL_TABLET | Freq: Four times a day (QID) | ORAL | 0 refills | Status: DC | PRN

## 2022-03-10 ENCOUNTER — Encounter: Payer: Self-pay | Admitting: Family Medicine

## 2022-03-10 ENCOUNTER — Ambulatory Visit (INDEPENDENT_AMBULATORY_CARE_PROVIDER_SITE_OTHER): Payer: Self-pay | Admitting: Family Medicine

## 2022-03-10 ENCOUNTER — Telehealth: Payer: Self-pay | Admitting: Family Medicine

## 2022-03-10 VITALS — BP 136/88 | HR 73 | Temp 97.7°F | Ht 64.5 in | Wt 196.6 lb

## 2022-03-10 DIAGNOSIS — M19042 Primary osteoarthritis, left hand: Secondary | ICD-10-CM

## 2022-03-10 DIAGNOSIS — S62101D Fracture of unspecified carpal bone, right wrist, subsequent encounter for fracture with routine healing: Secondary | ICD-10-CM

## 2022-03-10 DIAGNOSIS — M19041 Primary osteoarthritis, right hand: Secondary | ICD-10-CM

## 2022-03-10 DIAGNOSIS — F321 Major depressive disorder, single episode, moderate: Secondary | ICD-10-CM

## 2022-03-10 NOTE — Telephone Encounter (Signed)
Patient called stating that she fell on 07/27 and hurt her wrist. She was seen at [facility] and had an xray. The doctor that she saw referred her to Dr Betha Loa. She was not aware that he was in the [facility] system until after she saw him. He ordered a CT/MRI for a closer look. She was wondering if it would be better to see Dr Denyse Amass. She did not want to travel to [place] and felt that they have not been getting her imaging taken care of quick enough.  I told her if she would require surgery, then we would end up sending her somewhere else and didn't want to waste her time.  She asked if Dr Denyse Amass could look at her xray and advise if she would be okay seeing him or if she should go to an Orthopedic Surgeon?  Please advise.

## 2022-03-10 NOTE — Patient Instructions (Signed)
Please follow up as scheduled for your next visit with me: 06/23/2022   If you have any questions or concerns, please don't hesitate to send me a message via MyChart or call the office at [phone]. Thank you for visiting with [place] today! It's our pleasure caring for you.

## 2022-03-10 NOTE — Telephone Encounter (Signed)
I called Sarah Chung back.  Long conversation. It sounds like she is getting the right care but getting frustrated getting a CT scan scheduled.  I expressed that it is totally appropriate to be annoyed that she has some delays getting the CT scan scheduled.  I recommended that she call directly to the CT location and get it scheduled.  If she gets totally frustrated and wants me to take over care I certainly can although I think she is getting appropriate care with Dr. Merlyn Lot.

## 2022-03-12 NOTE — Progress Notes (Signed)
Subjective  CC:  Chief Complaint  Patient presents with   Medication Problem    Pt stated that she would like to stop the lexapro bc she is seeing a therapist every week.  Also to discuss joint pain    HPI: Sarah Chung is a 61 y.o. female who presents to the office today to address the problems listed above in the chief complaint. Depression: Patient has been on Lexapro since last visit but does not feel it has made any significant difference in her symptoms.  Fortunately she is doing better from seeing her therapist.  She continues with psychotherapy and is working on behavioral strategies to manage her mood and anxiety.  She would like to come off of the Lexapro.  She does not feel the medications are indicated at this time. Complains of osteoarthritis and pain in her distal fingers intermittently.  Wondering what can be done about this.  No loss of function. Fracture of her wrist: Reviewed notes.  Fell 727.  Reviewed emergency room notes and Dr Merlyn Lot notes.  She is scheduled for CT scan to ensure there is no rotation to the fracture.  She remains in splint.  Assessment  1. Depression, major, single episode, moderate (HCC)   2. Osteoarthritis of fingers of hands, bilateral   3. Closed fracture of right wrist with routine healing, subsequent encounter      Plan  Depression: To continue counseling.  Discussed weaning the Lexapro over the next 2 to 3 weeks.  Specific instructions given. Osteoarthritis of the fingers.  Mild, discussed Tylenol use glucosamine and adding Voltaren gel if needed. Radial fracture right wrist: To follow-up with hand surgeon, hopefully CT scan will be negative for rotation and this can be managed nonoperatively.  Follow up: For complete physical 06/23/2022  No orders of the defined types were placed in this encounter.  No orders of the defined types were placed in this encounter.     I reviewed the patients updated PMH, FH, and SocHx.    Patient Active  Problem List   Diagnosis Date Noted   Chronic anticoagulation 06/22/2021   Seizure disorder (HCC) 08/27/2020   Mixed hyperlipidemia 06/20/2020   Obesity 06/20/2020   Nonobstructive atherosclerosis of coronary artery 04/20/2019   Pulmonary embolism (HCC) x2 02/09/2019   Current Meds  Medication Sig   ezetimibe (ZETIA) 10 MG tablet TAKE 1 TABLET(10 MG) BY MOUTH DAILY   PHENobarbital (LUMINAL) 64.8 MG tablet TAKE 1 AND 1/2 TABLETS BY MOUTH EVERY NIGHT AT BEDTIME   XARELTO 20 MG TABS tablet TAKE 1 TABLET(20 MG) BY MOUTH DAILY WITH SUPPER    Allergies: Patient is allergic to tegretol [carbamazepine], keppra [levetiracetam], ketorolac tromethamine, and sulfa antibiotics. Family History: Patient family history includes Depression in her sister; [name] disease in her mother; Heart attack in her brother, father, and paternal grandmother; Heart disease in her brother and father; Kidney disease in her maternal grandfather; Mental illness in her mother; Parkinson's disease in her father. Social History:  Patient  reports that she has never smoked. She has never used smokeless tobacco. She reports that she does not drink alcohol and does not use drugs.  Review of Systems: Constitutional: Negative for fever malaise or anorexia Cardiovascular: negative for chest pain Respiratory: negative for SOB or persistent cough Gastrointestinal: negative for abdominal pain  Objective  Vitals: BP 136/88   Pulse 73   Temp 97.7 F (36.5 C)   Ht 5' 4.5" (1.638 m)   Wt 196 lb 9.6  oz (89.2 kg)   SpO2 97%   BMI 33.23 kg/m  General: no acute distress , A&Ox3 HEENT: PEERL, conjunctiva normal, neck is supple Cardiovascular:  RRR without murmur or gallop.  Respiratory:  Good breath sounds bilaterally, CTAB with normal respiratory effort Skin:  Warm, no rashes Hands: Mild distal osteoarthritic changes present.    Commons side effects, risks, benefits, and alternatives for medications and treatment plan  prescribed today were discussed, and the patient expressed understanding of the given instructions. Patient is instructed to call or message via MyChart if he/she has any questions or concerns regarding our treatment plan. No barriers to understanding were identified. We discussed Red Flag symptoms and signs in detail. Patient expressed understanding regarding what to do in case of urgent or emergency type symptoms.  Medication list was reconciled, printed and provided to the patient in AVS. Patient instructions and summary information was reviewed with the patient as documented in the AVS. This note was prepared with assistance of Dragon voice recognition software. Occasional wrong-word or sound-a-like substitutions may have occurred due to the inherent limitations of voice recognition software  This visit occurred during the SARS-CoV-2 public health emergency.  Safety protocols were in place, including screening questions prior to the visit, additional usage of staff PPE, and extensive cleaning of exam room while observing appropriate contact time as indicated for disinfecting solutions.

## 2022-03-17 ENCOUNTER — Inpatient Hospital Stay: Payer: Self-pay | Attending: Physician Assistant

## 2022-03-17 DIAGNOSIS — Z8249 Family history of ischemic heart disease and other diseases of the circulatory system: Secondary | ICD-10-CM | POA: Insufficient documentation

## 2022-03-17 DIAGNOSIS — Z882 Allergy status to sulfonamides status: Secondary | ICD-10-CM | POA: Insufficient documentation

## 2022-03-17 DIAGNOSIS — Z8349 Family history of other endocrine, nutritional and metabolic diseases: Secondary | ICD-10-CM | POA: Insufficient documentation

## 2022-03-17 DIAGNOSIS — I2699 Other pulmonary embolism without acute cor pulmonale: Secondary | ICD-10-CM | POA: Insufficient documentation

## 2022-03-17 DIAGNOSIS — E669 Obesity, unspecified: Secondary | ICD-10-CM | POA: Insufficient documentation

## 2022-03-17 DIAGNOSIS — R252 Cramp and spasm: Secondary | ICD-10-CM | POA: Insufficient documentation

## 2022-03-17 DIAGNOSIS — Z841 Family history of disorders of kidney and ureter: Secondary | ICD-10-CM | POA: Insufficient documentation

## 2022-03-17 DIAGNOSIS — I2692 Saddle embolus of pulmonary artery without acute cor pulmonale: Secondary | ICD-10-CM

## 2022-03-17 DIAGNOSIS — Z86718 Personal history of other venous thrombosis and embolism: Secondary | ICD-10-CM | POA: Insufficient documentation

## 2022-03-17 DIAGNOSIS — Z86711 Personal history of pulmonary embolism: Secondary | ICD-10-CM | POA: Insufficient documentation

## 2022-03-17 DIAGNOSIS — Z7901 Long term (current) use of anticoagulants: Secondary | ICD-10-CM | POA: Insufficient documentation

## 2022-03-17 DIAGNOSIS — R200 Anesthesia of skin: Secondary | ICD-10-CM | POA: Insufficient documentation

## 2022-03-17 DIAGNOSIS — Z79899 Other long term (current) drug therapy: Secondary | ICD-10-CM | POA: Insufficient documentation

## 2022-03-17 DIAGNOSIS — Z818 Family history of other mental and behavioral disorders: Secondary | ICD-10-CM | POA: Insufficient documentation

## 2022-03-17 DIAGNOSIS — Z9049 Acquired absence of other specified parts of digestive tract: Secondary | ICD-10-CM | POA: Insufficient documentation

## 2022-03-17 LAB — CBC WITH DIFFERENTIAL/PLATELET
Abs Immature Granulocytes: 0.02 10*3/uL (ref 0.00–0.07)
Basophils Absolute: 0.1 10*3/uL (ref 0.0–0.1)
Basophils Relative: 1 %
Eosinophils Absolute: 0.1 10*3/uL (ref 0.0–0.5)
Eosinophils Relative: 1 %
HCT: 38.8 % (ref 36.0–46.0)
Hemoglobin: 13 g/dL (ref 12.0–15.0)
Immature Granulocytes: 0 %
Lymphocytes Relative: 25 %
Lymphs Abs: 1.8 10*3/uL (ref 0.7–4.0)
MCH: 31.1 pg (ref 26.0–34.0)
MCHC: 33.5 g/dL (ref 30.0–36.0)
MCV: 92.8 fL (ref 80.0–100.0)
Monocytes Absolute: 0.5 10*3/uL (ref 0.1–1.0)
Monocytes Relative: 7 %
Neutro Abs: 4.5 10*3/uL (ref 1.7–7.7)
Neutrophils Relative %: 66 %
Platelets: 276 10*3/uL (ref 150–400)
RBC: 4.18 MIL/uL (ref 3.87–5.11)
RDW: 13.1 % (ref 11.5–15.5)
WBC: 6.9 10*3/uL (ref 4.0–10.5)
nRBC: 0 % (ref 0.0–0.2)

## 2022-03-17 LAB — D-DIMER, QUANTITATIVE: D-Dimer, Quant: <0.27 ug/mL-FEU (ref 0.00–0.50)

## 2022-03-23 NOTE — Progress Notes (Unsigned)
[facility] [address], [place] [address]   CLINIC:  Medical Oncology/Hematology  PCP:  Willow Ora, MD [address] [place] [address] [phone]   REASON FOR VISIT:  Follow-up for unprovoked DVT and PE  PRIOR THERAPY: None  CURRENT THERAPY: Xarelto  INTERVAL HISTORY:  Sarah Chung 60 y.o. 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  She was last seen by Rojelio Brenner, PA-C on 03/23/2021.  At today's visit, she reports feeling well.  She fell 3 weeks ago and broke her right wrist, but otherwise denies any recent hospitalizations, surgeries, or changes in baseline health status.  She continues to take Xarelto, and reports that she rarely misses any doses.  She has not had any bleeding events on Xarelto, no epistaxis, hematemesis, hematochezia, melena. She denies any leg swelling, pain, or erythema.  No shortness of breath, dyspnea on exertion, chest pain, cough, hemoptysis, or palpitations.  She does have ongoing bilateral leg cramping, which she believes is unrelated and which she attributes to her statin medication.  She has 100% energy and 100% appetite. She endorses that she is maintaining a stable weight.   REVIEW OF SYSTEMS:   Review of Systems  Constitutional:  Negative for appetite change, chills, diaphoresis, fatigue, fever and unexpected weight change.  HENT:   Negative for lump/mass and nosebleeds.   Eyes:  Negative for eye problems.  Respiratory:  Negative for cough, hemoptysis and shortness of breath.   Cardiovascular:  Negative for chest pain, leg swelling and palpitations.  Gastrointestinal:  Negative for abdominal pain, blood in stool, constipation, diarrhea, nausea and vomiting.  Genitourinary:  Negative for hematuria.   Skin: Negative.   Neurological:  Positive for numbness. Negative for dizziness, headaches and light-headedness.  Hematological:  Does not bruise/bleed  easily.     PAST MEDICAL/SURGICAL HISTORY:  Past Medical History:  Diagnosis Date   Allergy    Calculus of gallbladder with acute cholecystitis, without mention of obstruction 07/03/2013   Clotting disorder (HCC)    DVT (deep vein thrombosis) in pregnancy    Gallstones    Hyperlipidemia    Pulmonary embolism (HCC)    Seizures (HCC)    Past Surgical History:  Procedure Laterality Date   CHOLECYSTECTOMY N/A 07/02/2013   Procedure: LAPAROSCOPIC CHOLECYSTECTOMY WITH INTRAOPERATIVE CHOLANGIOGRAM;  Surgeon: Ernestene Mention, MD;  Location: [facility];  Service: General;  Laterality: N/A;   LEFT HEART CATH AND CORONARY ANGIOGRAPHY N/A 04/20/2019   Procedure: LEFT HEART CATH AND CORONARY ANGIOGRAPHY;  Surgeon: Lyn Records, MD;  Location: [facility] INVASIVE CV [facility];  Service: Cardiovascular;  Laterality: N/A;     SOCIAL HISTORY:  Social History   Socioeconomic History   Marital status: Divorced    Spouse name: [name]   Number of children: 2   Years of education: Nature conservation officer education level: Not on file  Occupational History   Occupation: Musician: [employer]  Tobacco Use   Smoking status: Never   Smokeless tobacco: Never  Vaping Use   Vaping Use: Never used  Substance and Sexual Activity   Alcohol use: No   Drug use: No   Sexual activity: Yes    Birth control/protection: Post-menopausal  Other Topics Concern   Not on file  Social History Narrative   Patient is married ([name]) and lives at home with his husband.   Patient has two adult children.   Patient  is working full-time.   Patient has a college education.   Patient is right-handed.   Patient drinks two cups of coffee and some soda.   Social Determinants of Health   Financial Resource Strain: Not on file  Food Insecurity: Not on file  Transportation Needs: Not on file  Physical Activity: Not on file  Stress: Not on file  Social Connections: Not on file  Intimate Partner Violence: Not on file     FAMILY HISTORY:  Family History  Problem Relation Age of Onset   Heart attack Father    Parkinson's disease Father    Heart disease Father    Heart disease Brother    Heart attack Brother    Mental illness Mother    [name] disease Mother    Depression Sister    Kidney disease Maternal Grandfather    Heart attack Paternal Grandmother     CURRENT MEDICATIONS:  Outpatient Encounter Medications as of 03/24/2022  Medication Sig   atorvastatin (LIPITOR) 80 MG tablet Take 1 tablet (80 mg total) by mouth daily.   ezetimibe (ZETIA) 10 MG tablet TAKE 1 TABLET(10 MG) BY MOUTH DAILY   PHENobarbital (LUMINAL) 64.8 MG tablet TAKE 1 AND 1/2 TABLETS BY MOUTH EVERY NIGHT AT BEDTIME   XARELTO 20 MG TABS tablet TAKE 1 TABLET(20 MG) BY MOUTH DAILY WITH SUPPER   No facility-administered encounter medications on file as of 03/24/2022.    ALLERGIES:  Allergies  Allergen Reactions   Tegretol [Carbamazepine] Hives   Keppra [Levetiracetam]     Feels outside her body.[name]   Ketorolac Tromethamine    Sulfa Antibiotics Hives     PHYSICAL EXAM:   ECOG PERFORMANCE STATUS: 0 - Asymptomatic  There were no vitals filed for this visit. There were no vitals filed for this visit. Physical Exam Constitutional:      Appearance: Normal appearance. She is obese.  HENT:     Head: Normocephalic and atraumatic.     Mouth/Throat:     Mouth: Mucous membranes are moist.  Eyes:     Extraocular Movements: Extraocular movements intact.     Pupils: Pupils are equal, round, and reactive to light.  Cardiovascular:     Rate and Rhythm: Normal rate and regular rhythm.     Pulses: Normal pulses.     Heart sounds: Normal heart sounds.  Pulmonary:     Effort: Pulmonary effort is normal.     Breath sounds: Normal breath sounds.  Abdominal:     General: Bowel sounds are normal.     Palpations: Abdomen is soft.     Tenderness: There is no abdominal tenderness.  Musculoskeletal:        General: No swelling.      Right lower leg: No edema.     Left lower leg: No edema.     Comments: Right wrist in cast  Lymphadenopathy:     Cervical: No cervical adenopathy.  Skin:    General: Skin is warm and dry.  Neurological:     General: No focal deficit present.     Mental Status: She is alert and oriented to person, place, and time.  Psychiatric:        Mood and Affect: Mood normal.        Behavior: Behavior normal.      LABORATORY DATA:  I have reviewed the labs as listed.  CBC    Component Value Date/Time   WBC 6.9 03/17/2022 0908   RBC 4.18 03/17/2022 0908   HGB  13.0 03/17/2022 0908   HCT 38.8 03/17/2022 0908   PLT 276 03/17/2022 0908   MCV 92.8 03/17/2022 0908   MCH 31.1 03/17/2022 0908   MCHC 33.5 03/17/2022 0908   RDW 13.1 03/17/2022 0908   LYMPHSABS 1.8 03/17/2022 0908   MONOABS 0.5 03/17/2022 0908   EOSABS 0.1 03/17/2022 0908   BASOSABS 0.1 03/17/2022 0908      Latest Ref Rng & Units 08/27/2021    9:28 AM 06/22/2021   11:51 AM 06/24/2020    1:53 PM  CMP  Glucose 70 - 99 mg/dL 485  79  71   BUN 8 - 27 mg/dL 14  17  19    Creatinine 0.57 - 1.00 mg/dL  9.27  6.39   Sodium 134 - 144 mmol/L 143  138  139   Potassium 3.5 - 5.2 mmol/L 5.1  3.9  4.4   Chloride 96 - 106 mmol/L 103  102  101   CO2 20 - 29 mmol/L 20  25  26    Calcium 8.7 - 10.3 mg/dL 9.5  9.1  9.8   Total Protein 6.0 - 8.5 g/dL 8.3  8.0  7.9   Total Bilirubin 0.0 - 1.2 mg/dL 0.4  0.4  0.5   Alkaline Phos 44 - 121 IU/L 158  118    AST 0 - 40 IU/L 25  22  21    ALT 0 - 32 IU/L 25  27  22      DIAGNOSTIC IMAGING:  I have independently reviewed the relevant imaging and discussed with the patient.  ASSESSMENT & PLAN: 1.  Unprovoked DVT and PE: - Presentation to the ER on 02/08/2019 with right leg pain and shortness of breath on exertion, started 2 to 3 days ago. - Ultrasound of the lower extremities on 02/08/2019 was positive for acute occlusive DVT in the right popliteal and calf veins. - CT of the chest PE  protocol on 02/08/2019 shows bulky bilateral clot burden and (left eccentric) saddle embolus.  CT evidence of heart strain. - She was admitted from 02/08/2019 through 02/10/2019 at [facility], started on anticoagulation, transitioned to Xarelto. - She had a history of left leg DVT 1993, few days after a C-section.  She was on limited duration Coumadin at that time. - She did not have any instigating factors prior to the latest DVT and PE. - 2D echo on 02/09/2019 shows ejection fraction of 60 to 65% with normal right ventricular cavity. - At time of diagnosis, Dr. 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  Based on her recent DVT and pulmonary embolism with clot burden, he recommended indefinite anticoagulation.  No indication for further hypercoagulable testing, as it would not affect the duration of anticoagulation. - She did not have any history of miscarriages.  She is a non-smoker.  No family history of DVT or malignancies. - She is up-to-date on her recommended age-appropriate cancer screenings: She had a colonoscopy on 02/02/2018 which showed no endoscopic evidence of bleeding, diverticula, mass, polyps or tumor in the entire colon.  She also has yearly mammograms which were normal.  No B symptoms or any indication of occult malignancy.   - Most recent D-dimer (03/17/2022) < 0.27.  CBC within normal limits. - She remains on Xarelto and is tolerating it well without any bleeding events   - No signs or symptoms of recurrent VTE   - PLAN: Benefits  continue to outweigh risks for long-term anticoagulation.  We will reassess in 1 year.  Refill for Xarelto sent to pharmacy x1 year.   PLAN SUMMARY & DISPOSITION: -Labs and RTC in 1 year    All questions were answered. The patient knows to call the clinic with any problems, questions or concerns.  Medical decision making: Low   Time spent on visit: I spent  15 minutes counseling the patient face to face. The total time spent in the appointment was 25 minutes and more than 50% was on counseling.   Carnella Guadalajara, PA-C  03/24/2022 8:41 AM

## 2022-03-24 ENCOUNTER — Inpatient Hospital Stay (HOSPITAL_BASED_OUTPATIENT_CLINIC_OR_DEPARTMENT_OTHER): Payer: Self-pay | Admitting: Physician Assistant

## 2022-03-24 VITALS — BP 144/86 | HR 67 | Resp 18 | Ht 64.0 in | Wt 194.7 lb

## 2022-03-24 DIAGNOSIS — I2692 Saddle embolus of pulmonary artery without acute cor pulmonale: Secondary | ICD-10-CM

## 2022-03-24 DIAGNOSIS — Z86711 Personal history of pulmonary embolism: Secondary | ICD-10-CM

## 2022-03-24 DIAGNOSIS — Z7901 Long term (current) use of anticoagulants: Secondary | ICD-10-CM

## 2022-03-24 NOTE — Patient Instructions (Signed)
[facility] at [facility] **VISIT SUMMARY & IMPORTANT INSTRUCTIONS **   You were seen today by Rojelio Brenner PA-C for your history of blood clots.  Continue to take Xarelto.  We will see you for follow-up in 1 year, but if you have any recurrent blood clots or major bleeding episodes before that time, please let our office know so we can schedule you for a sooner appointment.   ** Thank you for trusting me with your healthcare!  I strive to provide all of my patients with quality care at each visit.  If you receive a survey for this visit, I would be so grateful to you for taking the time to provide feedback.  Thank you in advance!  ~ Keirah Konitzer                   Dr. Doreatha Massed   &   Rojelio Brenner, PA-C   - - - - - - - - - - - - - - - - - -    Thank you for choosing [facility] at [facility] to provide your oncology and hematology care.  To afford each patient quality time with our provider, please arrive at least 15 minutes before your scheduled appointment time.   If you have a lab appointment with the Cancer Center please come in thru the Main Entrance and check in at the main information desk.  You need to re-schedule your appointment should you arrive 10 or more minutes late.  We strive to give you quality time with our providers, and arriving late affects you and other patients whose appointments are after yours.  Also, if you no show three or more times for appointments you may be dismissed from the clinic at the providers discretion.     Again, thank you for choosing [facility].  Our hope is that these requests will decrease the amount of time that you wait before being seen by our physicians.       _____________________________________________________________  Should you have questions after your visit to [facility], please contact our office at [phone] and follow the prompts.  Our office  hours are 8:00 a.m. and 4:30 p.m. Monday - Friday.  Please note that voicemails left after 4:00 p.m. may not be returned until the following business day.  We are closed weekends and major holidays.  You do have access to a nurse 24-7, just call the main number to the clinic [phone] and do not press any options, hold on the line and a nurse will answer the phone.    For prescription refill requests, have your pharmacy contact our office and allow 72 hours.

## 2022-05-05 ENCOUNTER — Other Ambulatory Visit: Payer: Self-pay | Admitting: Neurology

## 2022-05-05 ENCOUNTER — Telehealth: Payer: Self-pay | Admitting: Neurology

## 2022-05-05 ENCOUNTER — Other Ambulatory Visit: Payer: Self-pay

## 2022-05-05 DIAGNOSIS — Z5320 Procedure and treatment not carried out because of patient's decision for unspecified reasons: Secondary | ICD-10-CM

## 2022-05-05 MED ORDER — PHENOBARBITAL 64.8 MG PO TABS: 97.2000 mg | ORAL_TABLET | Freq: Every day | ORAL | 1 refills | Status: DC

## 2022-05-05 NOTE — Telephone Encounter (Signed)
Called pt back. She has 1 wk left of med. Aware refill routed to Dr. Brett Fairy to send in electronically today for her. She verbalized understanding.

## 2022-05-05 NOTE — Telephone Encounter (Signed)
Pt is calling. Stated she went to [facility] [phone] to pick up prescription PHENobarbital (LUMINAL) 64.8 MG tablet and was told they can not fill it and call your doctor. Pt is requesting a call-back.

## 2022-06-23 ENCOUNTER — Other Ambulatory Visit: Payer: Self-pay

## 2022-06-23 ENCOUNTER — Encounter: Payer: Self-pay | Admitting: Family Medicine

## 2022-06-23 ENCOUNTER — Ambulatory Visit (INDEPENDENT_AMBULATORY_CARE_PROVIDER_SITE_OTHER): Payer: Self-pay | Admitting: Family Medicine

## 2022-06-23 VITALS — BP 138/80 | HR 83 | Temp 98.2°F | Ht 64.0 in | Wt 195.4 lb

## 2022-06-23 DIAGNOSIS — Z6833 Body mass index (BMI) 33.0-33.9, adult: Secondary | ICD-10-CM

## 2022-06-23 DIAGNOSIS — Z5181 Encounter for therapeutic drug level monitoring: Secondary | ICD-10-CM

## 2022-06-23 DIAGNOSIS — E6609 Other obesity due to excess calories: Secondary | ICD-10-CM

## 2022-06-23 DIAGNOSIS — G40909 Epilepsy, unspecified, not intractable, without status epilepticus: Secondary | ICD-10-CM

## 2022-06-23 DIAGNOSIS — I2782 Chronic pulmonary embolism: Secondary | ICD-10-CM

## 2022-06-23 DIAGNOSIS — Z0000 Encounter for general adult medical examination without abnormal findings: Secondary | ICD-10-CM

## 2022-06-23 DIAGNOSIS — I2510 Atherosclerotic heart disease of native coronary artery without angina pectoris: Secondary | ICD-10-CM

## 2022-06-23 DIAGNOSIS — E782 Mixed hyperlipidemia: Secondary | ICD-10-CM

## 2022-06-23 DIAGNOSIS — F321 Major depressive disorder, single episode, moderate: Secondary | ICD-10-CM

## 2022-06-23 DIAGNOSIS — Z7901 Long term (current) use of anticoagulants: Secondary | ICD-10-CM

## 2022-06-23 LAB — CBC WITH DIFFERENTIAL/PLATELET
Basophils Absolute: 0 10*3/uL (ref 0.0–0.1)
Basophils Relative: 0.4 % (ref 0.0–3.0)
Eosinophils Absolute: 0.1 10*3/uL (ref 0.0–0.7)
Eosinophils Relative: 1.3 % (ref 0.0–5.0)
HCT: 38.5 % (ref 36.0–46.0)
Hemoglobin: 13 g/dL (ref 12.0–15.0)
Lymphocytes Relative: 17.7 % (ref 12.0–46.0)
Lymphs Abs: 1.9 10*3/uL (ref 0.7–4.0)
MCHC: 33.7 g/dL (ref 30.0–36.0)
MCV: 93.7 fl (ref 78.0–100.0)
Monocytes Absolute: 0.7 10*3/uL (ref 0.1–1.0)
Monocytes Relative: 6.7 % (ref 3.0–12.0)
Neutro Abs: 7.9 10*3/uL — ABNORMAL HIGH (ref 1.4–7.7)
Neutrophils Relative %: 73.9 % (ref 43.0–77.0)
Platelets: 269 10*3/uL (ref 150.0–400.0)
RBC: 4.11 Mil/uL (ref 3.87–5.11)
RDW: 14.6 % (ref 11.5–15.5)
WBC: 10.7 10*3/uL — ABNORMAL HIGH (ref 4.0–10.5)

## 2022-06-23 LAB — COMPREHENSIVE METABOLIC PANEL
ALT: 27 U/L (ref 0–35)
AST: 20 U/L (ref 0–37)
Albumin: 4.3 g/dL (ref 3.5–5.2)
Alkaline Phosphatase: 133 U/L — ABNORMAL HIGH (ref 39–117)
BUN: 17 mg/dL (ref 6–23)
CO2: 28 mEq/L (ref 19–32)
Calcium: 9.3 mg/dL (ref 8.4–10.5)
Chloride: 101 mEq/L (ref 96–112)
Creatinine, Ser: 1 mg/dL (ref 0.40–1.20)
GFR: 60.18 mL/min (ref >60.00)
Glucose, Bld: 86 mg/dL (ref 70–99)
Potassium: 4.3 mEq/L (ref 3.5–5.1)
Sodium: 139 mEq/L (ref 135–145)
Total Bilirubin: 0.4 mg/dL (ref 0.2–1.2)
Total Protein: 8 g/dL (ref 6.0–8.3)

## 2022-06-23 LAB — LIPID PANEL
Cholesterol: 163 mg/dL (ref 0–200)
HDL: 61.7 mg/dL (ref >39.00)
LDL Cholesterol: 83 mg/dL (ref 0–99)
NonHDL: 101.02
Total CHOL/HDL Ratio: 3
Triglycerides: 92 mg/dL (ref 0.0–149.0)
VLDL: 18.4 mg/dL (ref 0.0–40.0)

## 2022-06-23 LAB — TSH: TSH: 0.54 u[IU]/mL (ref 0.35–5.50)

## 2022-06-23 NOTE — Patient Instructions (Signed)
Please return in 12 months for your annual complete physical; please come fasting.   I will release your lab results to you on your MyChart account with further instructions. You may see the results before I do, but when I review them I will send you a message with my report or have my assistant call you if things need to be discussed. Please reply to my message with any questions. Thank you!   Send me the date of your last Tetanus shot please via MyChart.  If you have any questions or concerns, please don't hesitate to send me a message via MyChart or call the office at [phone]. Thank you for visiting with [place] today! It's our pleasure caring for you.   Please do these things to maintain good health!  Exercise at least 30-45 minutes a day,  4-5 days a week.  Eat a low-fat diet with lots of fruits and vegetables, up to 7-9 servings per day. Drink plenty of water daily. Try to drink 8 8oz glasses per day. Seatbelts can save your life. Always wear your seatbelt. Place Smoke Detectors on every level of your home and check batteries every year. Schedule an appointment with an eye doctor for an eye exam every 1-2 years Safe sex - use condoms to protect yourself from STDs if you could be exposed to these types of infections. Use birth control if you do not want to become pregnant and are sexually active. Avoid heavy alcohol use. If you drink, keep it to less than 2 drinks/day and not every day. Health Care Power of Attorney.  Choose someone you trust that could speak for you if you became unable to speak for yourself. Depression is common in our stressful world.If you're feeling down or losing interest in things you normally enjoy, please come in for a visit. If anyone is threatening or hurting you, please get help. Physical or Emotional Violence is never OK.

## 2022-06-23 NOTE — Progress Notes (Signed)
Subjective  Chief Complaint  Patient presents with   Annual Exam    Pt here for Annual Exam and is not fasting     HPI: Sarah Chung is a 61 y.o. female who presents to [facility] today for a Female Wellness Visit. She also has the concerns and/or needs as listed above in the chief complaint. These will be addressed in addition to the Health Maintenance Visit.   Wellness Visit: annual visit with health maintenance review and exam without Pap  HM: screens are current. Exercising again at the gym and loves it. Possibly due for Tdap; she will check at home her last shot date.  Was using a weight loss clinic: ozempic and mounjaro but has stopped.   Chronic disease f/u and/or acute problem visit: (deemed necessary to be done in addition to the wellness visit): HLD: goal < 70. On zetia 10 and lipitor 80. Non fasting for recheck. Tolerates ok.  Chronic eloquis. No bleeds or complications. No sxs of recurrent PE Mood: now improved. Therapy continues. Going to divorce care group which she likes Seizure free and due for phenobarbital level; last dose last night. Sees neuro for f/u soon Obesity: eating better and weight is trending downward.   Assessment  1. Annual physical exam   2. Nonobstructive atherosclerosis of coronary artery   3. Mixed hyperlipidemia   4. Chronic anticoagulation   5. Seizure disorder (HCC)   6. Other chronic pulmonary embolism without acute cor pulmonale (HCC)   7. Depression, major, single episode, moderate (HCC)   8. Encounter for therapeutic drug level monitoring   9. Class 1 obesity due to excess calories with serious comorbidity and body mass index (BMI) of 33.0 to 33.9 in adult      Plan  Female Wellness Visit: Age appropriate Health Maintenance and Prevention measures were discussed with patient. Included topics are cancer screening recommendations, ways to keep healthy (see AVS) including dietary and exercise recommendations,  regular eye and dental care, use of seat belts, and avoidance of moderate alcohol use and tobacco use. Mammo due in january BMI: discussed patient's BMI and encouraged positive lifestyle modifications to help get to or maintain a target BMI. HM needs and immunizations were addressed and ordered. See below for orders. See HM and immunization section for updates. May be eligible for tdap.  Routine labs and screening tests ordered including cmp, cbc and lipids where appropriate. Discussed recommendations regarding Vit D and calcium supplementation (see AVS)  Chronic disease management visit and/or acute problem visit: HLD: recheck lipids and lfts on zetia 10 and lipitor 80 Nonobstructive CAD: no chest pain Conitnue eloquis bid Mood is improving. Continue therapy Seizures: check phenobarbital level on 97.2 po qhs Continue exercise and discussed diet plan for weight loss  Follow up: 12 mo for cpe  Orders Placed This Encounter  Procedures   Phenobarbital level   CBC with Differential/Platelet   Comprehensive metabolic panel   Lipid panel   TSH   Hemoglobin A1c   No orders of the defined types were placed in this encounter.     Body mass index is 33.54 kg/m. Wt Readings from Last 3 Encounters:  06/23/22 195 lb 6.4 oz (88.6 kg)  03/24/22 194 lb 11.2 oz (88.3 kg)  03/10/22 196 lb 9.6 oz (89.2 kg)     Patient Active Problem List   Diagnosis Date Noted   Chronic anticoagulation 06/22/2021    H/o PE x 2    Seizure  disorder (HCC) 08/27/2020   Mixed hyperlipidemia 06/20/2020   Obesity 06/20/2020   Nonobstructive atherosclerosis of coronary artery 04/20/2019   Pulmonary embolism (HCC) x2 02/09/2019   Health Maintenance  Topic Date Due   TETANUS/TDAP  Never done   COVID-19 Vaccine (4 - Pfizer series) 07/09/2022 (Originally 08/30/2020)   MAMMOGRAM  08/23/2022   PAP SMEAR-Modifier  11/04/2026   COLONOSCOPY (Pts 45-70yrs Insurance coverage will need to be confirmed)  02/03/2028    HIV Screening  Completed   HPV VACCINES  Aged Out   INFLUENZA VACCINE  Discontinued   Hepatitis C Screening  Discontinued   Zoster Vaccines- Shingrix  Discontinued   Immunization History  Administered Date(s) Administered   Influenza,inj,Quad PF,6+ Mos 06/20/2020, 07/22/2021   PFIZER(Purple Top)SARS-COV-2 Vaccination 08/30/2019, 09/20/2019, 07/05/2020   We updated and reviewed the patient's past history in detail and it is documented below. Allergies: Patient is allergic to tegretol [carbamazepine], keppra [levetiracetam], ketorolac tromethamine, and sulfa antibiotics. Past Medical History Patient  has a past medical history of Allergy, Calculus of gallbladder with acute cholecystitis, without mention of obstruction (07/03/2013), Clotting disorder (HCC), DVT (deep vein thrombosis) in pregnancy, Gallstones, Hyperlipidemia, Pulmonary embolism (HCC), and Seizures (HCC). Past Surgical History Patient  has a past surgical history that includes Cholecystectomy (N/A, 07/02/2013) and LEFT HEART CATH AND CORONARY ANGIOGRAPHY (N/A, 04/20/2019). Family History: Patient family history includes Depression in her sister; [name] disease in her mother; Heart attack in her brother, father, and paternal grandmother; Heart disease in her brother and father; Kidney disease in her maternal grandfather; Mental illness in her mother; Parkinson's disease in her father. Social History:  Patient  reports that she has never smoked. She has never used smokeless tobacco. She reports that she does not drink alcohol and does not use drugs.  Review of Systems: Constitutional: negative for fever or malaise Ophthalmic: negative for photophobia, double vision or loss of vision Cardiovascular: negative for chest pain, dyspnea on exertion, or new LE swelling Respiratory: negative for SOB or persistent cough Gastrointestinal: negative for abdominal pain, change in bowel habits or melena Genitourinary: negative for dysuria or  gross hematuria, no abnormal uterine bleeding or disharge Musculoskeletal: negative for new gait disturbance or muscular weakness Integumentary: negative for new or persistent rashes, no breast lumps Neurological: negative for TIA or stroke symptoms Psychiatric: negative for SI or delusions Allergic/Immunologic: negative for hives  Patient Care Team    Relationship Specialty Notifications Start End  Willow Ora, MD PCP - General Family Medicine  03/16/21   Candice Camp, MD Consulting Physician Obstetrics and Gynecology  06/20/20   Dohmeier, Porfirio Mylar, MD Consulting Physician Neurology  06/20/20   Carman Ching, MD (Inactive) Consulting Physician Gastroenterology  06/20/20   Doreatha Massed, MD Consulting Physician Hematology  06/20/20   Ander Purpura, OD  Optometry  06/20/20     Objective  Vitals: BP 138/80   Pulse 83   Temp 98.2 F (36.8 C)   Ht 5\' 4"  (1.626 m)   Wt 195 lb 6.4 oz (88.6 kg)   SpO2 98%   BMI 33.54 kg/m  General:  Well developed, well nourished, no acute distress  Psych:  Alert and orientedx3,normal mood and affect HEENT:  Normocephalic, atraumatic, non-icteric sclera,  supple neck without adenopathy, mass or thyromegaly Cardiovascular:  Normal S1, S2, RRR without gallop, rub or murmur Respiratory:  Good breath sounds bilaterally, CTAB with normal respiratory effort Gastrointestinal: normal bowel sounds, soft, non-tender, no noted masses. No HSM MSK: no deformities, contusions. Joints are without  erythema or swelling.  Skin:  Warm, no rashes or suspicious lesions noted Neurologic:    Mental status is normal. CN 2-11 are normal. Gross motor and sensory exams are normal. Normal gait. No tremor   Commons side effects, risks, benefits, and alternatives for medications and treatment plan prescribed today were discussed, and the patient expressed understanding of the given instructions. Patient is instructed to call or message via MyChart if he/she has any questions  or concerns regarding our treatment plan. No barriers to understanding were identified. We discussed Red Flag symptoms and signs in detail. Patient expressed understanding regarding what to do in case of urgent or emergency type symptoms.  Medication list was reconciled, printed and provided to the patient in AVS. Patient instructions and summary information was reviewed with the patient as documented in the AVS. This note was prepared with assistance of Dragon voice recognition software. Occasional wrong-word or sound-a-like substitutions may have occurred due to the inherent limitations of voice recognition software

## 2022-06-24 ENCOUNTER — Encounter: Payer: Self-pay | Admitting: Family Medicine

## 2022-06-24 LAB — PHENOBARBITAL LEVEL: Phenobarbital, Serum: 7.4 mg/L — ABNORMAL LOW (ref 15.0–40.0)

## 2022-06-25 MED ORDER — GUAIFENESIN-CODEINE 100-10 MG/5ML PO SOLN: 5.0000 mL | Freq: Four times a day (QID) | ORAL | 0 refills | Status: DC | PRN

## 2022-06-28 ENCOUNTER — Other Ambulatory Visit: Payer: Self-pay

## 2022-06-28 DIAGNOSIS — Z0000 Encounter for general adult medical examination without abnormal findings: Secondary | ICD-10-CM

## 2022-07-19 ENCOUNTER — Other Ambulatory Visit (INDEPENDENT_AMBULATORY_CARE_PROVIDER_SITE_OTHER): Payer: Self-pay

## 2022-07-19 DIAGNOSIS — Z0000 Encounter for general adult medical examination without abnormal findings: Secondary | ICD-10-CM

## 2022-07-19 LAB — HEMOGLOBIN A1C: Hgb A1c MFr Bld: 6 % (ref 4.6–6.5)

## 2022-07-20 ENCOUNTER — Other Ambulatory Visit: Payer: Self-pay | Admitting: Physician Assistant

## 2022-07-21 ENCOUNTER — Other Ambulatory Visit: Payer: Self-pay

## 2022-08-23 ENCOUNTER — Telehealth: Payer: Self-pay | Admitting: Family Medicine

## 2022-08-23 NOTE — Telephone Encounter (Signed)
Patient states her blood pressure was 178/95 on 08/22/22 (was on a [employer]), ankles and tops of both feet were swollen (swelling goes back and forth). Had a bad head ache. States she gets really bad head aches when blood pressure goes higher than normal (Patient states her blood pressure never usually goes over 140/84). States no headache right now.  Patient is unable to check blood pressure today-returning from [place].  Transferred to Triage Annie Main).

## 2022-08-24 ENCOUNTER — Encounter: Payer: Self-pay | Admitting: Family Medicine

## 2022-08-24 ENCOUNTER — Ambulatory Visit (INDEPENDENT_AMBULATORY_CARE_PROVIDER_SITE_OTHER): Payer: Self-pay | Admitting: Family Medicine

## 2022-08-24 VITALS — BP 120/70 | HR 74 | Temp 97.8°F | Ht 64.0 in | Wt 202.8 lb

## 2022-08-24 DIAGNOSIS — R030 Elevated blood-pressure reading, without diagnosis of hypertension: Secondary | ICD-10-CM

## 2022-08-24 DIAGNOSIS — R600 Localized edema: Secondary | ICD-10-CM

## 2022-08-24 NOTE — Progress Notes (Signed)
Subjective  CC:  Chief Complaint  Patient presents with   Hypertension    Pt stated that she has been having some Bp issues along with headaches and foot swelling     HPI: Sarah Chung is a 62 y.o. female who presents to the office today to address the problems listed above in the chief complaint. 62 year old female with history of elevated blood pressure but without the diagnosis of hypertension presents due to lower extremity edema and elevated blood pressures.  She reports that she noticed a headache 2 days prior to leaving on a cruise about 10 days ago, took her blood pressure was mildly elevated.  She traveled to [place] to get on a cruise ship and noticed that her right foot was very swollen.  She bought a blood pressure cuff and checked her blood pressure throughout the next week and it was elevated.  She had headaches and was worried about her blood pressure and leg swelling.  She had bilateral foot and leg swelling intermittent over the last week while on the cruise.  Edema would resolve upon awakening.  No calf pain.  No foot pain.  History of swelling.  No NSAID use, no alcohol.  Denies shortness of breath, palpitations or chest pain.  She returned a few days ago and her swelling has improved.  Home blood pressure this morning was 140s over 70s.  She is quite anxious and distraught over the possibility of having high blood pressure.  No moderate or severe headaches.  No neurologic changes. Lab Results  Component Value Date   CREATININE 1.00 06/23/2022   BUN 17 06/23/2022   NA 139 06/23/2022   K 4.3 06/23/2022   CL 101 06/23/2022   CO2 28 06/23/2022   Lab Results  Component Value Date   TSH 0.54 06/23/2022   Lab Results  Component Value Date   CHOL 163 06/23/2022   HDL 61.70 06/23/2022   LDLCALC 83 06/23/2022   TRIG 92.0 06/23/2022   CHOLHDL 3 06/23/2022   Wt Readings from Last 3 Encounters:  08/24/22 202 lb 12.8 oz (92 kg)  06/23/22 195 lb 6.4 oz (88.6 kg)  03/24/22  194 lb 11.2 oz (88.3 kg)   BP Readings from Last 3 Encounters:  08/24/22 120/70  06/23/22 138/80  03/24/22 (!) 144/86    Assessment  1. Elevated BP without diagnosis of hypertension   2. Leg edema      Plan  Elevated blood pressure: She has had an occasional elevated reading here in the office with some borderline readings as well.  Likely has developed essential hypertension.  However, after lying her down and rechecking today her reading was normal. She is doing the right things by doing weight watchers, low-sodium diet, exercising regularly over the last 2 weeks working on weight loss.  Reassurance given.  No signs of heart failure or liver disease.  Recent lab work was all reassuring.  Her weight is up.  Recommend continuing blood pressure checks at home intermittently, low-sodium diet and weight loss and recheck in 3 to 4 weeks.  If blood pressures remain elevated, we will start a diuretic for hypertension.  Counseling education given.  Recommend elevating legs when swollen considering compression stockings.  Follow up: 3 to 4 weeks to recheck blood pressure multiple Visit date not found  No orders of the defined types were placed in this encounter.  No orders of the defined types were placed in this encounter.     I reviewed  the patients updated PMH, FH, and SocHx.    Patient Active Problem List   Diagnosis Date Noted   Chronic anticoagulation 06/22/2021   Seizure disorder ([facility]) 08/27/2020   Mixed hyperlipidemia 06/20/2020   Obesity 06/20/2020   Nonobstructive atherosclerosis of coronary artery 04/20/2019   Pulmonary embolism (HCC) x2 02/09/2019   Current Meds  Medication Sig   ezetimibe (ZETIA) 10 MG tablet TAKE 1 TABLET(10 MG) BY MOUTH DAILY   PHENobarbital (LUMINAL) 64.8 MG tablet Take 1.5 tablets (97.2 mg total) by mouth at bedtime.   XARELTO 20 MG TABS tablet TAKE 1 TABLET(20 MG) BY MOUTH DAILY WITH SUPPER    Allergies: Patient is allergic to tegretol  [carbamazepine], keppra [levetiracetam], ketorolac tromethamine, and sulfa antibiotics. Family History: Patient family history includes Depression in her sister; Berenice Primas' disease in her mother; Heart attack in her brother, father, and paternal grandmother; Heart disease in her brother and father; Kidney disease in her maternal grandfather; Mental illness in her mother; Parkinson's disease in her father. Social History:  Patient  reports that she has never smoked. She has never used smokeless tobacco. She reports that she does not drink alcohol and does not use drugs.  Review of Systems: Constitutional: Negative for fever malaise or anorexia Cardiovascular: negative for chest pain Respiratory: negative for SOB or persistent cough Gastrointestinal: negative for abdominal pain  Objective  Vitals: BP 120/70   Pulse 74   Temp 97.8 F (36.6 C)   Ht 5\' 4"  (1.626 m)   Wt 202 lb 12.8 oz (92 kg)   SpO2 98%   BMI 34.81 kg/m  General: no acute distress , A&Ox3 HEENT: PEERL, conjunctiva normal, neck is supple Cardiovascular:  RRR without murmur or gallop.  Respiratory:  Good breath sounds bilaterally, CTAB with normal respiratory effort Abdomen: Soft, nontender, no hepatosplenomegaly Skin:  Warm, no rashes Extremities: No edema, no calf tenderness this  Commons side effects, risks, benefits, and alternatives for medications and treatment plan prescribed today were discussed, and the patient expressed understanding of the given instructions. Patient is instructed to call or message via MyChart if he/she has any questions or concerns regarding our treatment plan. No barriers to understanding were identified. We discussed Red Flag symptoms and signs in detail. Patient expressed understanding regarding what to do in case of urgent or emergency type symptoms.  Medication list was reconciled, printed and provided to the patient in AVS. Patient instructions and summary information was reviewed with the  patient as documented in the AVS. This note was prepared with assistance of Dragon voice recognition software. Occasional wrong-word or sound-a-like substitutions may have occurred due to the inherent limitations of voice recognition software

## 2022-08-24 NOTE — Telephone Encounter (Signed)
Pt advised to see a provider within 3 days. Will call to follow up.   Patient Name: Sarah Chung Gender: Female DOB: 09-26-1960 Age: 62 Chung 20 M 23 D Return Phone Number: [phone] (Primary) Address: [place] [address] Client [facility] Provider Billey Chang- MD Contact Type Call Who Is Calling Patient / Member / Family / Caregiver Call Type Triage / Clinical Relationship To Patient Self Return Phone Number [phone] (Primary) Chief Complaint Headache Reason for Call Symptomatic / Request for Health Information Initial Comment Caller states their ankles and feet are swollen and has a headache. Translation No Nurse Assessment Nurse: Cox, RN, Holly Date/Time (Eastern Time): 08/23/2022 4:30:22 PM Confirm and document reason for call. If symptomatic, describe symptoms. ---Caller states their ankles and feet are swollen that started suddenly, with no history that started a day or two prior to going on a cruise, and states that she also recently started a rigorous exercise program. Also reports that she has been having episodes of headache with high blood pressure measurements in the 170's/90's. Does the patient have any new or worsening symptoms? ---Yes Will a triage be completed? ---Yes Related visit to physician within the last 2 weeks? ---Yes Does the PT have any chronic conditions? (i.e. diabetes, asthma, this includes High risk factors for pregnancy, etc.) ---Yes List chronic conditions. ---Hypertension, PE, seizures Is this a behavioral health or substance abuse call? ---No Guidelines Guideline Title Affirmed Question Affirmed Notes Nurse Date/Time Eilene Ghazi Time) Leg Swelling and Edema [1] MILD swelling of both ankles (i.e., pedal edema) AND Cox, RN, [facility] 08/23/2022 4:33:30 PM Guidelines Guideline Title Affirmed Question Affirmed Notes  Nurse Date/Time (Eastern Time) [2] new-onset or worsening Disp. Time Eilene Ghazi Time) Disposition Final User 08/23/2022 4:41:42 PM SEE PCP WITHIN 3 DAYS Yes Cox, RN, [employer] Final Disposition 08/23/2022 4:41:42 PM SEE PCP WITHIN 3 DAYS Yes Cox, RN, [employer] Understands Yes PreDisposition Did not know what to do Care Advice Given Per Guideline SEE PCP WITHIN 3 DAYS: * You need to be seen within 2 or 3 days. * PCP VISIT: Call your doctor (or NP/PA) during regular office hours and make an appointment. A clinic or urgent care center are good places to go for care if your doctor's office is closed or you can't get an appointment. NOTE: If office will be open tomorrow, tell caller to call then, not in 3 days. LEG SWELLING OR EDEMA: * Elevate your legs or try to lie down one or two times a day for 20 minutes. * Avoid socks with an elastic band at the top. * Wear comfortable shoes. CALL BACK IF: * Swelling becomes worse * Calf pain occurs and becomes constant * Swelling becomes red or painful to the touch * You become worse CARE ADVICE given per Leg Swelling and Edema (Adult) guideline. Referrals REFERRED TO PCP OFFICE

## 2022-08-24 NOTE — Patient Instructions (Signed)
Please return in 3-4 weeks to recheck blood pressures.  Please bring in your cuff and home readings at that time.   If you have any questions or concerns, please don't hesitate to send me a message via MyChart or call the office at [phone]. Thank you for visiting with [place] today! It's our pleasure caring for you.

## 2022-08-30 ENCOUNTER — Ambulatory Visit: Payer: Self-pay | Admitting: Neurology

## 2022-09-01 ENCOUNTER — Ambulatory Visit (INDEPENDENT_AMBULATORY_CARE_PROVIDER_SITE_OTHER): Payer: Self-pay | Admitting: Neurology

## 2022-09-01 ENCOUNTER — Encounter: Payer: Self-pay | Admitting: Neurology

## 2022-09-01 VITALS — BP 156/89 | HR 68 | Ht 64.0 in | Wt 200.0 lb

## 2022-09-01 DIAGNOSIS — G40909 Epilepsy, unspecified, not intractable, without status epilepticus: Secondary | ICD-10-CM

## 2022-09-01 DIAGNOSIS — I2782 Chronic pulmonary embolism: Secondary | ICD-10-CM

## 2022-09-01 DIAGNOSIS — Z780 Asymptomatic menopausal state: Secondary | ICD-10-CM

## 2022-09-01 DIAGNOSIS — Z5320 Procedure and treatment not carried out because of patient's decision for unspecified reasons: Secondary | ICD-10-CM

## 2022-09-01 DIAGNOSIS — F321 Major depressive disorder, single episode, moderate: Secondary | ICD-10-CM | POA: Insufficient documentation

## 2022-09-01 MED ORDER — PHENOBARBITAL 64.8 MG PO TABS: 97.2000 mg | ORAL_TABLET | Freq: Every day | ORAL | 1 refills | Status: DC

## 2022-09-01 NOTE — Progress Notes (Signed)
[facility]  Provider:  Larey Seat, M D  Referring Provider: Leamon Arnt, MD Primary Care Physician:  Leamon Arnt, MD  Chief Complaint  Patient presents with   Follow-up    RM 1 alone Pt is well and stable, no sz or new concerns since last visit.    09-01-22, Sarah for  Sarah Chung, who is a 62 y.o. divorced female and seen here as a yearly, routine revisit  for refill of her seizure medication. Last visit 08-27-2022. No breakthrough seizure activity.  Her pheno-barbitol level is expectantly low but the medication is hard to wean, but she feels she needs to be on it. I like for her to be on something that is less hard on her system in the long term but she is not fully insured and there are cost issues. She did not tolerate keppra.  Her most expensive medication is Xeralto, chronic anticoagulation is needed since DVT/ PE in 2020. She is hypertensive,  Medical interval history: a fall caused a right wrist radial fracture. Occupational therapy completed.   08-27-2020: Interval History: Sarah Chung is here in tears , her father died shortly before he wanted to move in with her-  at age 68, moderate Parkinson's Disease, this was a blow. Her father had  CAD and a stent stenosis, and a skin cancer related biopsy- he deteriorated. She is still separated and not divorced and hates the limbo.  There is a lot of anxiety, about health and finances, etc.  Here to refill her medication.     05-30-2019: Sarah Chung is here in tears , her husband told her he would like to end their marriage- this message was a blow. They have been married 35 years this November. Two adult daughters have a lot of problems, too. Borderline personality disorder. She provides her grandchildren's care and her daughter spends the day in bed. She is without health insurance.     02-2019 she had a hospital visit for Shortness of breath and leg swelling- turned out DVT and PE.  In summary Sarah Chung was admitted on  7-2 2020 through the emergency room at [facility] started on Xarelto after she had noted leg swelling and shortness of breath.  CTA chest impression positive for acute pulmonary embolism with bulky bilateral clot burden and left eccentric saddle embolus.  CTA evidence of heart strain.  No pleural effusion or pulmonary infarction coronary artery disease with likely aortic atherosclerosis, normal electrolytes EKG ST 808 normal axis T inversion in V1 and V2 ST depression in 2 and 3 and lateral leads.  She was admitted for acute right lower extremity DVT and acute pulmonary embolism.  Dr. Maudie Mercury was her physician.  She was transferred to [facility] where she was admitted to cardiac care unit. On August 17 she was again seen in the emergency department for irregular heartbeats and palpitations.  Started on Xarelto after heparin bridge, she had good compliance with her medications she continue to take phenobarbitol.  A significant improvement in bilateral pulmonary emboli was noted on with current exam a small volume residual thrombus in the left lower lobe was noted and segmental pulmonary arteries the remainder of the pulmonary artery filling defects had resolved.  She had bilateral lower lobe atelectasis which she can improve with exercise and deep breathing.  Aortic atherosclerosis was again mentioned.  The patient was then evaluated with another CT angiogram and then a left heart catheterization was performed under the  guidance of [name] on 20 April 2019 conclusion here was a mild to moderate LAD circumflex and right coronary vascular calcification 25 to 30% mid LAD 40 to 50% eccentric mid circumflex 30% mid RCA normal left ventricular function this is a very good result.   02-28-2019: She had no new spells and needs refills, she has had a NCV study , see below.  She had some hip discomfort that was not primarily painful and was worked up by Dr. Hilma Favors.  Her preoperative was 2 at the  highest yields exam due to her uninsured status.  He ordered an x-Chung of the hip which showed no abnormalities.  He tried meloxicam which actually helped many other aches and pains but did not change her hip, she restrained refrained herself from heavy lifting, pulling, pushing- but she still was involved in the childcare of her well over 25 pound weighing 2 grandsons.  She also had some extensive metabolic panel CBC and differential phenobarbital level and vitamin D levels all these returned within normal limits- I explained here her phenobarbital is low but it is not a primary seizure medication in her, her vitamin D level while it is low and [employer] that are ever seen in my medicine, alkaline phosphatase 128 and is likely related to menopausal bone mass loss, she has normal liver function, she has normal kidney function, she did not have signs of diabetes or thyroid disease, nor pernicious anemia. Her stress full home life remains unchanged.   Interval history from 06/17/2016, Sarah Chung has been seen in the last 2 appointments by my nurse practitioner. She is here for her yearly refill on phenobarbital we will also perform a comprehensive metabolic panel to evaluate her for liver and kidney function. She has not had interval seizure activity.  Works as a Cabin crew. 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  Her other daughter has a personality disorder, is very clingy and had been recently in an ( physically)  abusive relation ship.    06-12-15 Mrs. Shrestha's daughter Sarah Chung just had a baby boy, unfortunately born prematurely,  but doing well. Sarah Chung went into preeclampsia. Her mother had manifestations of seizures at the time she  had her children. This has not been repeated in [place]. She has developed  preeclampsia , severe mood disorder and somatization. Sarah Chung had been with her mother witnessing her last grand mal seizure as well as being present when she was diagnosed with a blood clot. Since both of these symptoms could have occurred and preeclampsia as well.  [place] 29-30 .     Mrs. Aase  has a history of seizure disorder. 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  She has no other medical problems except for arthritis arthralgias.  She is here for her refills on her phenobarbital.  She had a blood clot in 1993 after giving birth to her daughter Sarah Chung.  She denies staring spells, confusion, sleep disturbances, lapses of time, headache and bowel and bladder incontinence. No hot flushes-.she feels often but is not sweating.   Social History   Socioeconomic History   Marital status: Divorced    Spouse name: [name]   Number of children: 2   Years of education: [employer]  Highest education level: Not on file  Occupational History   Occupation: Programmer, systems: [employer]  Tobacco Use   Smoking status: Never   Smokeless tobacco: Never  Vaping Use   Vaping Use: Never used  Substance and Sexual Activity   Alcohol use: No   Drug use: No   Sexual activity: Yes    Birth control/protection: Post-menopausal  Other Topics Concern   Not on file  Social History Narrative   Patient is married ([name]) and lives at home with his husband.   Patient has two adult children.   Patient is working full-time.   Patient has a college education.   Patient is right-handed.   Patient drinks two cups of coffee and some soda.   Social Determinants of Health   Financial Resource Strain: Not on file  Food Insecurity: Not on file  Transportation Needs: Not on file  Physical Activity: Not on file  Stress: Not on file  Social Connections: Not on file  Intimate Partner Violence: Not on  file    Family History  Problem Relation Age of Onset   Heart attack Father    Parkinson's disease Father    Heart disease Father    Heart disease Brother    Heart attack Brother    Mental illness Mother    Berenice Primas' disease Mother    Depression Sister    Kidney disease Maternal Grandfather    Heart attack Paternal Grandmother     Past Medical History:  Diagnosis Date   Allergy    Calculus of gallbladder with acute cholecystitis, without mention of obstruction 07/03/2013   Clotting disorder ([place])    DVT (deep vein thrombosis) in pregnancy    Gallstones    Hyperlipidemia    Pulmonary embolism ([place])    Seizures ([place])     Past Surgical History:  Procedure Laterality Date   CHOLECYSTECTOMY N/A 07/02/2013   Procedure: LAPAROSCOPIC CHOLECYSTECTOMY WITH INTRAOPERATIVE CHOLANGIOGRAM;  Surgeon: Adin Hector, MD;  Location: [facility];  Service: General;  Laterality: N/A;   LEFT HEART CATH AND CORONARY ANGIOGRAPHY N/A 04/20/2019   Procedure: LEFT HEART CATH AND CORONARY ANGIOGRAPHY;  Surgeon: Belva Crome, MD;  Location: [facility];  Service: Cardiovascular;  Laterality: N/A;    Current Outpatient Medications  Medication Sig Dispense Refill   atorvastatin (LIPITOR) 80 MG tablet Take 1 tablet (80 mg total) by mouth daily. 90 tablet 3   ezetimibe (ZETIA) 10 MG tablet TAKE 1 TABLET(10 MG) BY MOUTH DAILY 30 tablet 11   PHENobarbital (LUMINAL) 64.8 MG tablet Take 1.5 tablets (97.2 mg total) by mouth at bedtime. 135 tablet 1   XARELTO 20 MG TABS tablet TAKE 1 TABLET(20 MG) BY MOUTH DAILY WITH SUPPER 30 tablet 12   No current facility-administered medications for this visit.    Allergies as of 09/01/2022 - Review Complete 09/01/2022  Allergen Reaction Noted   Tegretol [carbamazepine] Hives 11/06/2012   Keppra [levetiracetam]  11/06/2012   Ketorolac tromethamine  03/12/2020   Sulfa antibiotics Hives 12/16/2015    Vitals: BP (!) 156/89   Pulse 68   Ht 5\' 4"  (1.626 m)    Wt 200 lb (90.7 kg)   BMI 34.33 kg/m  Last Weight:  Wt Readings from Last 1 Encounters:  09/01/22 200 lb (90.7 kg)   Last Height:   Ht Readings from Last 1 Encounters:  09/01/22 5\' 4"  (1.626 m)    Physical exam:  General: The patient is awake, alert  and appears not in acute distress. The patient is well groomed. Head: Normocephalic, atraumatic. Neck is supple. Mallampati 2 , neck circumference: 14 inches . Developed a small hump.  Cardiovascular:  Regular rate and rhythm , without  murmurs or carotid bruit, and without distended neck veins. Respiratory: Lungs are clear to auscultation. Skin:  Without evidence of edema, or rash Trunk: BMI is elevated, 34,she  has normal posture.  Neurologic exam : The patient is awake and alert, oriented to place and time.   Memory subjective described as intact. There is a normal attention span & concentration ability.  Speech is pressured  without  dysarthria, dysphonia or aphasia.  Mood and affect are much proved. She is no longer tearful, appeared less depressed.  Cranial nerves: Pupils are equal and briskly reactive to light.  Extraocular movements  in vertical and horizontal planes intact Visual fields by finger perimetry are intact.  Facial sensation intact to fine touch. Motor exam:   Normal tone, muscle bulk and symmetric strength in all extremities. Sensory:  Fine touch, pinprick and vibration were normal-  Coordination: Rapid alternating movements /Finger-to-nose maneuver tested and normal without evidence of ataxia, dysmetria or tremor. Gait and station: Patient walks without assistive device. Strength within normal limits.  Deep tendon reflexes: in the  upper and lower extremities are symmetric and intact.    Assessment:  After physical and neurologic examination, review of laboratory studies, imaging, neurophysiology testing and pre-existing records, assessment is:   1) seizure disorder controlled on phenobarbital- no changes in  medication. Her serum level is low at 7-14, but her last seizure was 15 years ago.  Reviewed CMET / CBC diff today today. Continue and add Vit D OTC and meloxicam refill.  2) Her memory may take a toll from these long years on barbiturate medication.  3) bone density testing is needed. Please contact PCP.  Melvyn Novas, MD   09-01-2022

## 2022-09-14 ENCOUNTER — Ambulatory Visit: Payer: Self-pay | Admitting: Family Medicine

## 2022-09-17 ENCOUNTER — Ambulatory Visit (INDEPENDENT_AMBULATORY_CARE_PROVIDER_SITE_OTHER): Payer: Self-pay | Admitting: Family Medicine

## 2022-09-17 ENCOUNTER — Encounter: Payer: Self-pay | Admitting: Family Medicine

## 2022-09-17 VITALS — BP 160/90 | HR 75 | Temp 97.7°F | Ht 64.0 in | Wt 194.8 lb

## 2022-09-17 DIAGNOSIS — I10 Essential (primary) hypertension: Secondary | ICD-10-CM

## 2022-09-17 MED ORDER — LISINOPRIL-HYDROCHLOROTHIAZIDE 10-12.5 MG PO TABS: 1.0000 | ORAL_TABLET | Freq: Every day | ORAL | 3 refills | Status: DC

## 2022-09-17 NOTE — Progress Notes (Unsigned)
Subjective  CC:  Chief Complaint  Patient presents with   Hypertension    HPI: Sarah Chung is a 62 y.o. female who presents to the office today to address the problems listed above in the chief complaint. Hypertension: elevated bp short term f/u. Brings in cuff today and it correlates with our elevated reading. Has had high readings at home as well. Gets some headaches. No cp. Stressed  Eating low sodium, healthy low fat, weight loss and exercising.  Assessment  1. Essential hypertension      Plan   Hypertension : new dx: education given. Start zestoretic 10/12.5 daily. Recheck 3 months with labs and OV Education regarding management of these chronic disease states was given. Management strategies discussed on successive visits include dietary and exercise recommendations, goals of achieving and maintaining IBW, and lifestyle modifications aiming for adequate sleep and minimizing stressors.   Follow up: 3 mo for bp  No orders of the defined types were placed in this encounter.  Meds ordered this encounter  Medications   lisinopril-hydrochlorothiazide (ZESTORETIC) 10-12.5 MG tablet    Sig: Take 1 tablet by mouth daily.    Dispense:  90 tablet    Refill:  3      BP Readings from Last 3 Encounters:  09/17/22 (!) 160/90  09/01/22 (!) 156/89  08/24/22 120/70   Wt Readings from Last 3 Encounters:  09/17/22 194 lb 12.8 oz (88.4 kg)  09/01/22 200 lb (90.7 kg)  08/24/22 202 lb 12.8 oz (92 kg)    Lab Results  Component Value Date   CHOL 163 06/23/2022   CHOL 141 06/22/2021   CHOL 177 06/24/2020   Lab Results  Component Value Date   HDL 61.70 06/23/2022   HDL 58.50 06/22/2021   HDL 63 06/24/2020   Lab Results  Component Value Date   LDLCALC 83 06/23/2022   LDLCALC 69 06/22/2021   LDLCALC 99 06/24/2020   Lab Results  Component Value Date   TRIG 92.0 06/23/2022   TRIG 65.0 06/22/2021   TRIG 63 06/24/2020   Lab Results  Component Value Date   CHOLHDL 3  06/23/2022   CHOLHDL 2 06/22/2021   CHOLHDL 2.8 06/24/2020   No results found for: "LDLDIRECT" Lab Results  Component Value Date   CREATININE 1.00 06/23/2022   BUN 17 06/23/2022   NA 139 06/23/2022   K 4.3 06/23/2022   CL 101 06/23/2022   CO2 28 06/23/2022    The 10-year ASCVD risk score (Arnett DK, et al., 2019) is: 7.8%   Values used to calculate the score:     Age: 18 years     Sex: Female     Is Non-Hispanic African American: No     Diabetic: No     Tobacco smoker: No     Systolic Blood Pressure: 0000000 mmHg     Is BP treated: Yes     HDL Cholesterol: 61.7 mg/dL     Total Cholesterol: 163 mg/dL  I reviewed the patients updated PMH, FH, and SocHx.    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 She has never used smokeless tobacco. She reports that she does not drink alcohol and does not use drugs.  Current Meds  Medication Sig   ezetimibe (ZETIA) 10 MG tablet TAKE 1 TABLET(10 MG) BY MOUTH DAILY   lisinopril-hydrochlorothiazide (ZESTORETIC) 10-12.5 MG tablet Take 1 tablet by mouth daily.   PHENobarbital (LUMINAL) 64.8 MG tablet Take 1.5 tablets (97.2 mg total) by mouth at bedtime.   XARELTO 20 MG TABS tablet TAKE 1 TABLET(20 MG) BY MOUTH DAILY WITH SUPPER    Review of Systems: Cardiovascular: negative for chest pain, palpitations, leg swelling, orthopnea Respiratory: negative for SOB, wheezing or persistent cough Gastrointestinal: negative for abdominal  pain Genitourinary: negative for dysuria or gross hematuria  Objective  Vitals: BP (!) 160/90   Pulse 75   Temp 97.7 F (36.5 C)   Ht 5' 4"$  (1.626 m)   Wt 194 lb 12.8 oz (88.4 kg)   SpO2 97%   BMI 33.44 kg/m  General: no acute distress  Psych:  Alert and oriented, normal mood and affect HEENT:  Normocephalic, atraumatic, supple neck  Cardiovascular:  RRR without murmur. no edema Respiratory:  Good breath sounds bilaterally, CTAB with normal respiratory effort Neurologic:   Mental status is normal Commons side effects, risks, benefits, and alternatives for medications and treatment plan prescribed today were discussed, and the patient expressed understanding of the given instructions. Patient is instructed to call or message via MyChart if he/she has any questions or concerns regarding our treatment plan. No barriers to understanding were identified. We discussed Red Flag symptoms and signs in detail. Patient expressed understanding regarding what to do in case of urgent or emergency type symptoms.  Medication list was reconciled, printed and provided to the patient in AVS. Patient instructions and summary information was reviewed with the patient as documented in the AVS. This note was prepared with assistance of Dragon voice recognition software. Occasional wrong-word or sound-a-like substitutions may have occurred due to the inherent limitation

## 2022-09-17 NOTE — Patient Instructions (Addendum)
Please return in 3 months to recheck blood pressure.   I have ordered a combination blood pressure pill: lisinopril hct. Take once daily. It should bring down your numbers.   Eckhart Tolle: Gaffer.   If you have any questions or concerns, please don't hesitate to send me a message via MyChart or call the office at [phone]. Thank you for visiting with [place] today! It's our pleasure caring for you.   Hypertension, Adult High blood pressure (hypertension) is when the force of blood pumping through the arteries is too strong. The arteries are the blood vessels that carry blood from the heart throughout the body. Hypertension forces the heart to work harder to pump blood and may cause arteries to become narrow or stiff. Untreated or uncontrolled hypertension can lead to a heart attack, heart failure, a stroke, kidney disease, and other problems. A blood pressure reading consists of a higher number over a lower number. Ideally, your blood pressure should be below 120/80. The first ("top") number is called the systolic pressure. It is a measure of the pressure in your arteries as your heart beats. The second ("bottom") number is called the diastolic pressure. It is a measure of the pressure in your arteries as the heart relaxes. What are the causes? The exact cause of this condition is not known. There are some conditions that result in high blood pressure. What increases the risk? Certain factors may make you more likely to develop high blood pressure. Some of these risk factors are under your control, including: Smoking. Not getting enough exercise or physical activity. Being overweight. Having too much fat, sugar, calories, or salt (sodium) in your diet. Drinking too much alcohol. Other risk factors include: Having a personal history of heart disease, diabetes, high cholesterol, or kidney disease. Stress. Having a family history of high blood pressure and high  cholesterol. Having obstructive sleep apnea. Age. The risk increases with age. What are the signs or symptoms? High blood pressure may not cause symptoms. Very high blood pressure (hypertensive crisis) may cause: Headache. Fast or irregular heartbeats (palpitations). Shortness of breath. Nosebleed. Nausea and vomiting. Vision changes. Severe chest pain, dizziness, and seizures. How is this diagnosed? This condition is diagnosed by measuring your blood pressure while you are seated, with your arm resting on a flat surface, your legs uncrossed, and your feet flat on the floor. The cuff of the blood pressure monitor will be placed directly against the skin of your upper arm at the level of your heart. Blood pressure should be measured at least twice using the same arm. Certain conditions can cause a difference in blood pressure between your right and left arms. If you have a high blood pressure reading during one visit or you have normal blood pressure with other risk factors, you may be asked to: Return on a different day to have your blood pressure checked again. Monitor your blood pressure at home for 1 week or longer. If you are diagnosed with hypertension, you may have other blood or imaging tests to help your health care provider understand your overall risk for other conditions. How is this treated? This condition is treated by making healthy lifestyle changes, such as eating healthy foods, exercising more, and reducing your alcohol intake. You may be referred for counseling on a healthy diet and physical activity. Your health care provider may prescribe medicine if lifestyle changes are not enough to get your blood pressure under control and if: Your systolic blood pressure  is above 130. Your diastolic blood pressure is above 80. Your personal target blood pressure may vary depending on your medical conditions, your age, and other factors. Follow these instructions at home: Eating and  drinking  Eat a diet that is high in fiber and potassium, and low in sodium, added sugar, and fat. An example of this eating plan is called the DASH diet. DASH stands for Dietary Approaches to Stop Hypertension. To eat this way: Eat plenty of fresh fruits and vegetables. Try to fill one half of your plate at each meal with fruits and vegetables. Eat whole grains, such as whole-wheat pasta, brown rice, or whole-grain bread. Fill about one fourth of your plate with whole grains. Eat or drink low-fat dairy products, such as skim milk or low-fat yogurt. Avoid fatty cuts of meat, processed or cured meats, and poultry with skin. Fill about one fourth of your plate with lean proteins, such as fish, chicken without skin, beans, eggs, or tofu. Avoid pre-made and processed foods. These tend to be higher in sodium, added sugar, and fat. Reduce your daily sodium intake. Many people with hypertension should eat less than 1,500 mg of sodium a day. Do not drink alcohol if: Your health care provider tells you not to drink. You are pregnant, may be pregnant, or are planning to become pregnant. If you drink alcohol: Limit how much you have to: 0-1 drink a day for women. 0-2 drinks a day for men. Know how much alcohol is in your drink. In the [place] one drink equals one 12 oz bottle of beer (355 mL), one 5 oz glass of wine (148 mL), or one 1 oz glass of hard liquor (44 mL). Lifestyle  Work with your health care provider to maintain a healthy body weight or to lose weight. Ask what an ideal weight is for you. Get at least 30 minutes of exercise that causes your heart to beat faster (aerobic exercise) most days of the week. Activities may include walking, swimming, or biking. Include exercise to strengthen your muscles (resistance exercise), such as Pilates or lifting weights, as part of your weekly exercise routine. Try to do these types of exercises for 30 minutes at least 3 days a week. Do not use any products  that contain nicotine or tobacco. These products include cigarettes, chewing tobacco, and vaping devices, such as e-cigarettes. If you need help quitting, ask your health care provider. Monitor your blood pressure at home as told by your health care provider. Keep all follow-up visits. This is important. Medicines Take over-the-counter and prescription medicines only as told by your health care provider. Follow directions carefully. Blood pressure medicines must be taken as prescribed. Do not skip doses of blood pressure medicine. Doing this puts you at risk for problems and can make the medicine less effective. Ask your health care provider about side effects or reactions to medicines that you should watch for. Contact a health care provider if you: Think you are having a reaction to a medicine you are taking. Have headaches that keep coming back (recurring). Feel dizzy. Have swelling in your ankles. Have trouble with your vision. Get help right away if you: Develop a severe headache or confusion. Have unusual weakness or numbness. Feel faint. Have severe pain in your chest or abdomen. Vomit repeatedly. Have trouble breathing. These symptoms may be an emergency. Get help right away. Call 911. Do not wait to see if the symptoms will go away. Do not drive yourself to the hospital.  Summary Hypertension is when the force of blood pumping through your arteries is too strong. If this condition is not controlled, it may put you at risk for serious complications. Your personal target blood pressure may vary depending on your medical conditions, your age, and other factors. For most people, a normal blood pressure is less than 120/80. Hypertension is treated with lifestyle changes, medicines, or a combination of both. Lifestyle changes include losing weight, eating a healthy, low-sodium diet, exercising more, and limiting alcohol. This information is not intended to replace advice given to you by  your health care provider. Make sure you discuss any questions you have with your health care provider. Document Revised: 06/02/2021 Document Reviewed: 06/02/2021 Elsevier Patient Education  [place].

## 2022-09-20 DIAGNOSIS — I10 Essential (primary) hypertension: Secondary | ICD-10-CM | POA: Insufficient documentation

## 2022-10-30 ENCOUNTER — Other Ambulatory Visit: Payer: Self-pay | Admitting: Internal Medicine

## 2022-11-05 ENCOUNTER — Ambulatory Visit: Payer: Self-pay | Admitting: Internal Medicine

## 2022-11-18 LAB — HM DEXA SCAN

## 2022-11-22 ENCOUNTER — Other Ambulatory Visit: Payer: Self-pay | Admitting: Obstetrics and Gynecology

## 2022-11-22 DIAGNOSIS — R928 Other abnormal and inconclusive findings on diagnostic imaging of breast: Secondary | ICD-10-CM

## 2022-11-22 LAB — HM MAMMOGRAPHY

## 2022-11-24 ENCOUNTER — Other Ambulatory Visit: Payer: Self-pay | Admitting: Internal Medicine

## 2022-11-24 DIAGNOSIS — I2692 Saddle embolus of pulmonary artery without acute cor pulmonale: Secondary | ICD-10-CM

## 2022-12-03 ENCOUNTER — Ambulatory Visit
Admission: RE | Admit: 2022-12-03 | Discharge: 2022-12-03 | Disposition: A | Discharge status: Home / Self Care | Payer: No Typology Code available for payment source | Source: Ambulatory Visit | Attending: Obstetrics and Gynecology | Admitting: Obstetrics and Gynecology

## 2022-12-03 ENCOUNTER — Ambulatory Visit
Admission: RE | Admit: 2022-12-03 | Discharge: 2022-12-03 | Disposition: A | Discharge status: Home / Self Care | Payer: Self-pay | Source: Ambulatory Visit | Attending: Obstetrics and Gynecology | Admitting: Obstetrics and Gynecology

## 2022-12-03 DIAGNOSIS — R928 Other abnormal and inconclusive findings on diagnostic imaging of breast: Secondary | ICD-10-CM

## 2022-12-17 ENCOUNTER — Encounter: Payer: Self-pay | Admitting: Family Medicine

## 2022-12-17 ENCOUNTER — Ambulatory Visit (INDEPENDENT_AMBULATORY_CARE_PROVIDER_SITE_OTHER): Payer: Self-pay | Admitting: Family Medicine

## 2022-12-17 VITALS — BP 128/74 | HR 72 | Temp 98.0°F | Ht 64.0 in | Wt 183.0 lb

## 2022-12-17 DIAGNOSIS — M8580 Other specified disorders of bone density and structure, unspecified site: Secondary | ICD-10-CM

## 2022-12-17 DIAGNOSIS — Z6831 Body mass index (BMI) 31.0-31.9, adult: Secondary | ICD-10-CM

## 2022-12-17 DIAGNOSIS — R252 Cramp and spasm: Secondary | ICD-10-CM

## 2022-12-17 DIAGNOSIS — R7301 Impaired fasting glucose: Secondary | ICD-10-CM

## 2022-12-17 DIAGNOSIS — I10 Essential (primary) hypertension: Secondary | ICD-10-CM

## 2022-12-17 DIAGNOSIS — E6609 Other obesity due to excess calories: Secondary | ICD-10-CM

## 2022-12-17 LAB — HEMOGLOBIN A1C: Hgb A1c MFr Bld: 5.8 % (ref 4.6–6.5)

## 2022-12-17 LAB — BASIC METABOLIC PANEL
BUN: 32 mg/dL — ABNORMAL HIGH (ref 6–23)
CO2: 28 mEq/L (ref 19–32)
Calcium: 9.5 mg/dL (ref 8.4–10.5)
Chloride: 101 mEq/L (ref 96–112)
Creatinine, Ser: 1.14 mg/dL (ref 0.40–1.20)
GFR: 51.25 mL/min — ABNORMAL LOW (ref >60.00)
Glucose, Bld: 92 mg/dL (ref 70–99)
Potassium: 5.1 mEq/L (ref 3.5–5.1)
Sodium: 139 mEq/L (ref 135–145)

## 2022-12-17 LAB — MAGNESIUM: Magnesium: 2.1 mg/dL (ref 1.5–2.5)

## 2022-12-17 LAB — VITAMIN D 25 HYDROXY (VIT D DEFICIENCY, FRACTURES): VITD: 24.79 ng/mL — ABNORMAL LOW (ref 30.00–100.00)

## 2022-12-17 MED ORDER — CETIRIZINE HCL 10 MG PO TABS: 10.0000 mg | ORAL_TABLET | Freq: Every day | ORAL | 11 refills | Status: DC

## 2022-12-17 MED ORDER — OLMESARTAN MEDOXOMIL-HCTZ 20-12.5 MG PO TABS
1.0000 | ORAL_TABLET | Freq: Every day | ORAL | 3 refills | Status: DC
Start: 2022-12-17 — End: 2023-06-22

## 2022-12-17 NOTE — Progress Notes (Signed)
Subjective  CC:  Chief Complaint  Patient presents with   Hypertension    HPI: Sarah Chung is a 62 y.o. female who presents to the office today to address the problems listed above in the chief complaint. Hypertension f/u: Control is good . Pt reports she is doing well. taking medications as instructed, side effects noted by patient include dry cough, no TIAs, no chest pain on exertion, no dyspnea on exertion, no swelling of ankles.  Thought cough was related to allergies.  Home readings are all normal now.  Compliance with medication is good.  She is well lost over 20 pounds with weight watchers.  Feels great.   C/o muscle cramps in the legs.  H/o IFG w/o sxs. Eating clean Reviewed dexa w/ osteopenia.    Assessment  1. Essential hypertension   2. Muscle cramps   3. Class 1 obesity due to excess calories with serious comorbidity and body mass index (BMI) of 31.0 to 31.9 in adult   4. IFG (impaired fasting glucose)   5. Osteopenia, unspecified location      Plan   Hypertension f/u: BP control is well controlled. However, with ace cough. Change to arb: benicar hct. Follow home readings.  Check BMP. Muscle cramps: Could be related to changes in diet, weight loss, electrolyte abnormalities, vitamin deficiencies etc.  Check lab work.  Recommend good hydration and stretching Weight loss, congratulated.  Continued weight watchers.  Recheck A1c.  6 months ago was at 6.0 Osteopenia: Recommend calcium vitamin D and weightbearing exercises.  Recheck 2 years  Education regarding management of these chronic disease states was given. Management strategies discussed on successive visits include dietary and exercise recommendations, goals of achieving and maintaining IBW, and lifestyle modifications aiming for adequate sleep and minimizing stressors.   Follow up: nov 2024 for cpe  Orders Placed This Encounter  Procedures   Basic metabolic panel   Hemoglobin A1c   VITAMIN D 25 Hydroxy  (Vit-D Deficiency, Fractures)   Magnesium   Meds ordered this encounter  Medications   cetirizine (ZYRTEC) 10 MG tablet    Sig: Take 1 tablet (10 mg total) by mouth at bedtime.    Dispense:  30 tablet    Refill:  11   olmesartan-hydrochlorothiazide (BENICAR HCT) 20-12.5 MG tablet    Sig: Take 1 tablet by mouth daily.    Dispense:  90 tablet    Refill:  3      BP Readings from Last 3 Encounters:  12/17/22 128/74  09/17/22 (!) 160/90  09/01/22 (!) 156/89   Wt Readings from Last 3 Encounters:  12/17/22 183 lb (83 kg)  09/17/22 194 lb 12.8 oz (88.4 kg)  09/01/22 200 lb (90.7 kg)    Lab Results  Component Value Date   CHOL 163 06/23/2022   CHOL 141 06/22/2021   CHOL 177 06/24/2020   Lab Results  Component Value Date   HDL 61.70 06/23/2022   HDL 58.50 06/22/2021   HDL 63 06/24/2020   Lab Results  Component Value Date   LDLCALC 83 06/23/2022   LDLCALC 69 06/22/2021   LDLCALC 99 06/24/2020   Lab Results  Component Value Date   TRIG 92.0 06/23/2022   TRIG 65.0 06/22/2021   TRIG 63 06/24/2020   Lab Results  Component Value Date   CHOLHDL 3 06/23/2022   CHOLHDL 2 06/22/2021   CHOLHDL 2.8 06/24/2020   No results found for: "LDLDIRECT" Lab Results  Component Value Date   CREATININE  1.00 06/23/2022   BUN 17 06/23/2022   NA 139 06/23/2022   K 4.3 06/23/2022   CL 101 06/23/2022   CO2 28 06/23/2022    The 10-year ASCVD risk score (Arnett DK, et al., 2019) is: 5%   Values used to calculate the score:     Age: 74 years     Sex: Female     Is Non-Hispanic African American: No     Diabetic: No     Tobacco smoker: No     Systolic Blood Pressure: 128 mmHg     Is BP treated: Yes     HDL Cholesterol: 61.7 mg/dL     Total Cholesterol: 163 mg/dL  I reviewed the patients updated PMH, FH, and SocHx.    Patient Active Problem List   Diagnosis Date Noted   Osteopenia 12/17/2022   Essential hypertension 09/20/2022   Depression, major, single episode, moderate  ([facility]) 09/01/2022   Chronic anticoagulation 06/22/2021   Seizure disorder ([facility]) 08/27/2020   Mixed hyperlipidemia 06/20/2020   Obesity 06/20/2020   Nonobstructive atherosclerosis of coronary artery 04/20/2019   Pulmonary embolism ([facility]) x2 02/09/2019    Allergies: Tegretol [carbamazepine], Ace inhibitors, Keppra [levetiracetam], Ketorolac tromethamine, and Sulfa antibiotics  Social History: Patient  reports that she has never smoked. She has never used smokeless tobacco. She reports that she does not drink alcohol and does not use drugs.  Current Meds  Medication Sig   atorvastatin (LIPITOR) 80 MG tablet TAKE 1 TABLET(80 MG) BY MOUTH DAILY   cetirizine (ZYRTEC) 10 MG tablet Take 1 tablet (10 mg total) by mouth at bedtime.   ezetimibe (ZETIA) 10 MG tablet TAKE 1 TABLET(10 MG) BY MOUTH DAILY   olmesartan-hydrochlorothiazide (BENICAR HCT) 20-12.5 MG tablet Take 1 tablet by mouth daily.   PHENobarbital (LUMINAL) 64.8 MG tablet Take 1.5 tablets (97.2 mg total) by mouth at bedtime.   XARELTO 20 MG TABS tablet TAKE 1 TABLET(20 MG) BY MOUTH DAILY WITH SUPPER   [DISCONTINUED] lisinopril-hydrochlorothiazide (ZESTORETIC) 10-12.5 MG tablet Take 1 tablet by mouth daily.    Review of Systems: Cardiovascular: negative for chest pain, palpitations, leg swelling, orthopnea Respiratory: negative for SOB, wheezing or persistent cough Gastrointestinal: negative for abdominal pain Genitourinary: negative for dysuria or gross hematuria  Objective  Vitals: BP 128/74   Pulse 72   Temp 98 F (36.7 C)   Ht 5\' 4"  (1.626 m)   Wt 183 lb (83 kg)   SpO2 95%   BMI 31.41 kg/m  General: no acute distress  Psych:  Alert and oriented, normal mood and affect HEENT:  Normocephalic, atraumatic, supple neck  Cardiovascular:  RRR without murmur. no edema Respiratory:  Good breath sounds bilaterally, CTAB with normal respiratory effort Skin:  Warm, no rashes Neurologic:   Mental status is normal Commons side  effects, risks, benefits, and alternatives for medications and treatment plan prescribed today were discussed, and the patient expressed understanding of the given instructions. Patient is instructed to call or message via MyChart if he/she has any questions or concerns regarding our treatment plan. No barriers to understanding were identified. We discussed Red Flag symptoms and signs in detail. Patient expressed understanding regarding what to do in case of urgent or emergency type symptoms.  Medication list was reconciled, printed and provided to the patient in AVS. Patient instructions and summary information was reviewed with the patient as documented in the AVS. This note was prepared with assistance of Dragon voice recognition software. Occasional wrong-word or sound-a-like substitutions may have  occurred due to the inherent limitation

## 2022-12-17 NOTE — Patient Instructions (Signed)
Please return in 6 months for your annual complete physical; please come fasting.    I will release your lab results to you on your MyChart account with further instructions. You may see the results before I do, but when I review them I will send you a message with my report or have my assistant call you if things need to be discussed. Please reply to my message with any questions. Thank you!   Start an otc allergy pill: zyrtec, allegra or claritin. They are ok with your phenoparbital  If you have any questions or concerns, please don't hesitate to send me a message via MyChart or call the office at [phone]. Thank you for visiting with [place] today! It's our pleasure caring for you.

## 2022-12-24 ENCOUNTER — Encounter: Payer: Self-pay | Admitting: Family Medicine

## 2023-01-16 ENCOUNTER — Other Ambulatory Visit: Payer: Self-pay | Admitting: Internal Medicine

## 2023-01-17 ENCOUNTER — Other Ambulatory Visit: Payer: Self-pay | Admitting: Internal Medicine

## 2023-01-18 ENCOUNTER — Other Ambulatory Visit: Payer: Self-pay | Admitting: Internal Medicine

## 2023-01-18 DIAGNOSIS — I2692 Saddle embolus of pulmonary artery without acute cor pulmonale: Secondary | ICD-10-CM

## 2023-01-18 NOTE — Telephone Encounter (Signed)
Prescription refill request for Xarelto received.  Indication: DVT/PE Last office visit: 10/28/21  Lovina Reach MD (appt 03/04/23) Weight: 90.7kg Age: 62 Scr: 1.14 on 12/17/22 CrCl: 72.32  Based on above findings Xarelto 20mg  daily is the appropriate dose.  Refill approved.

## 2023-01-24 ENCOUNTER — Telehealth: Payer: Self-pay | Admitting: Internal Medicine

## 2023-01-24 NOTE — Telephone Encounter (Signed)
Patient Assistance Program form has been dropped off at the Front Desk to be filled out and returned to patient.   Patient's phone number to reach out to her is [phone]

## 2023-01-25 NOTE — Telephone Encounter (Signed)
Pt's patient assistance application for the prescriber was scanned to [facility], LPN email. Thanks Larita Fife, LPN

## 2023-01-26 NOTE — Telephone Encounter (Signed)
Patient assistance form at front for you.

## 2023-01-26 NOTE — Telephone Encounter (Signed)
Patient is calling to get update on application to assistance program and would like a call back to discuss.

## 2023-01-26 NOTE — Telephone Encounter (Signed)
Patient assistance application forms were scanned to [facility], LPN, email. FYI

## 2023-01-26 NOTE — Telephone Encounter (Signed)
Returned call to patient. She states she has tax returns that she will bring by today to have scanned in and sent to [facility], LPN to assist with completing her patient assistance application.  Informed patient that Larita Fife is out of the office today but should be back in tomorrow. I will forward her message to her to have her reach out to patient to discuss where we are at with her application.  Patient verbalized understanding and expressed appreciation for call.

## 2023-01-31 NOTE — Telephone Encounter (Signed)
**Note De-Identified Sarah Chung Obfuscation** The pt left her completed [employer] pt asst application for Xarelto assistance at the office.  I have completed the providers page of her application and have e-mailed all to the nurse working with Dr Tenny Craw on Wednesday 6/26 so she can obtain her signature, date it, and to then fax all to [employer] at the fax number written on the cover letter included.

## 2023-02-02 NOTE — Telephone Encounter (Signed)
Patient assistance forms signed and faxed.  -------Fax Transmission Report-------  To:               Recipient at [phone] Subject:          Fw: Send data from [id number] 02/02/2023 16:58 Result:           The transmission was successful. Explanation:      All Pages Ok Pages Sent:       7 Connect Time:     9 minutes, 22 seconds Transmit Time:    02/02/2023 16:37 Transfer Rate:    14400 Status Code:      0000 Retry Count:      0 Job Id:           [id number] Unique Id:        [id number] Fax Line:         81 Fax Server:       [id number]

## 2023-02-04 NOTE — Telephone Encounter (Signed)
**Note De-Identified Minor Iden Obfuscation** Letter received Sarah Chung fax from [employer] stating that they have denied the pt for Xarelto assistance because she does not meet the programs eligibility requirements as follows: Income requirement and She must be uninsured for Xarelto ID [id number]  The letter states that they have notified the pt of this denial as well.

## 2023-02-15 NOTE — Telephone Encounter (Signed)
**Note De-Identified Lathen Seal Obfuscation** Letter received from [place] [employer] [place] stating that they have approved the pt for Xarelto assistance until 02/14/2024. [id number]  The letter states that they have notified the pt of this approval as well.

## 2023-03-01 ENCOUNTER — Telehealth: Payer: Self-pay | Admitting: Internal Medicine

## 2023-03-01 NOTE — Telephone Encounter (Signed)
Pts appt moved to [place] on 03/11/23.

## 2023-03-01 NOTE — Telephone Encounter (Signed)
Patient states she received call from what she believes was an Sarah Chung, requesting patient call back.

## 2023-03-04 ENCOUNTER — Ambulatory Visit: Payer: Self-pay | Admitting: Internal Medicine

## 2023-03-10 NOTE — Progress Notes (Signed)
Cardiology Office Note   Date:  03/11/2023   ID:  Sarah Chung Mar 15, 1961, MRN [id number]  PCP:  Sarah Ora, MD  Cardiologist:   Sarah Pates, MD   Pt presents for follow up of CAD    History of Present Illness: Sarah Chung is a 62 y.o. female with a history of nonobstructive CAD  L heart cath 04/20/2019  showed 40 to 50% proximal l/ mid left circumflex.  FFR borderline significance, medical therapy recommended.  Pt also has a  history of pulmonary embolism and DVT.  Plan for  lifelong Xarelto.    Diet in 2023  Started weight loss program Goal 175 Breakfast:  egg with velveta cheeze  Low carb wrap  [employer]  Coffee  Sweet and low  Lunch   Salad   Soup     6 in sub Chicken    Dinner   Chicken   Salmon     Cut back on pasta     water Snacks   Not a snacker      I saw the pt in 2023 Since seen she denies CP  Breathing is OK  Denies palpitations  No dizziness No LE edema     Continues to work on diet   Losing weight   She says that earlier this year her BP jump up high  170s/  Placed no antihypertensive   Current Meds  Medication Sig   atorvastatin (LIPITOR) 80 MG tablet TAKE 1 TABLET(80 MG) BY MOUTH DAILY   olmesartan-hydrochlorothiazide (BENICAR HCT) 20-12.5 MG tablet Take 1 tablet by mouth daily.   PHENobarbital (LUMINAL) 64.8 MG tablet Take 1.5 tablets (97.2 mg total) by mouth at bedtime.   XARELTO 20 MG TABS tablet TAKE 1 TABLET(20 MG) BY MOUTH DAILY WITH SUPPER   [DISCONTINUED] ezetimibe (ZETIA) 10 MG tablet TAKE 1 TABLET(10 MG) BY MOUTH DAILY     Allergies:   Tegretol [carbamazepine], Ace inhibitors, Keppra [levetiracetam], Ketorolac tromethamine, Sulfa antibiotics, and Lisinopril-hydrochlorothiazide   Past Medical History:  Diagnosis Date   Allergy    Calculus of gallbladder with acute cholecystitis, without mention of obstruction 07/03/2013   Clotting disorder (HCC)    DVT (deep vein thrombosis) in pregnancy    Gallstones    Hyperlipidemia     Pulmonary embolism (HCC)    Seizures (HCC)     Past Surgical History:  Procedure Laterality Date   CHOLECYSTECTOMY N/A 07/02/2013   Procedure: LAPAROSCOPIC CHOLECYSTECTOMY WITH INTRAOPERATIVE CHOLANGIOGRAM;  Surgeon: Ernestene Mention, MD;  Location: [facility];  Service: General;  Laterality: N/A;   LEFT HEART CATH AND CORONARY ANGIOGRAPHY N/A 04/20/2019   Procedure: LEFT HEART CATH AND CORONARY ANGIOGRAPHY;  Surgeon: Lyn Records, MD;  Location: [facility];  Service: Cardiovascular;  Laterality: N/A;     Social History:  The patient  reports that she has never smoked. She has never used smokeless tobacco. She reports that she does not drink alcohol and does not use drugs.   Family History:  The patient's family history includes Depression in her sister; [name] disease in her mother; Heart attack in her brother, father, and paternal grandmother; Heart disease in her brother and father; Kidney disease in her maternal grandfather; Mental illness in her mother; Parkinson's disease in her father.    ROS:  Please see the history of present illness. All other systems are reviewed and  Negative to the above problem except as noted.    PHYSICAL EXAM:  VS:  BP 124/78 (BP Location: Right Arm, Patient Position: Sitting, Cuff Size: Normal)   Pulse 61   Ht 5' 4.5" (1.638 m)   Wt 183 lb (83 kg)   SpO2 99%   BMI 30.93 kg/m   GEN: Obese 62 yo , in no acute distress  HEENT: normal Neck: no JVD, carotid bruit Cardiac: RRR; no murmur  No LE  edema + varicose veins Respiratory:  clear to auscultation. GI: soft, nontender,   No hepatomegaly  MS: no deformity Moving all extremities   Skin: warm and dry, no rash Neuro:  Strength and sensation are intact Psych: euthymic mood, full affect   EKG:  EKG is ordered today.  Sinus bradycardia   Cath   04/2019  Mild to moderate LAD, circumflex, and right coronary calcification. 25 to 30% proximal to mid LAD. 40 to 50% eccentric proximal to mid  circumflex. 30% mid RCA. Normal LV function.  LVEDP is normal.   RECOMMENDATIONS:   Optimal medical therapy for nonobstructive coronary atherosclerosis: Lifestyle modifications as needed for heart health, aggressive lipid-lowering, assessment of glycemic control, blood pressure control. In context of clinical symptoms, intervention is not appropriate.  FFR in circumflex is borderline significant.  This should be followed clinically over time with PCI to be considered if symptoms not controllable with medical therapy. Echo 2020    1. The left ventricle has normal systolic function with an ejection  fraction of 60-65%. The cavity size was normal. There is mildly increased  left ventricular wall thickness. Left ventricular diastolic Doppler  parameters are consistent with impaired  relaxation. Indeterminate filling pressures The E/e' is 8-15. No evidence  of left ventricular regional wall motion abnormalities.   2. The mitral valve is grossly normal.   3. The tricuspid valve is grossly normal.   4. The aortic valve is tricuspid. Aortic valve regurgitation is trivial  by color flow Doppler. No stenosis of the aortic valve.   5. The inferior vena cava was dilated in size with <50% respiratory  variability.   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   FINDINGS    Lipid Panel    Component Value Date/Time   CHOL 163 06/23/2022 1022   TRIG 92.0 06/23/2022 1022   HDL 61.70 06/23/2022 1022   CHOLHDL 3 06/23/2022 1022   VLDL 18.4 06/23/2022 1022   LDLCALC 83 06/23/2022 1022   LDLCALC 99 06/24/2020 1353      Wt Readings from Last 3 Encounters:  03/11/23 183 lb (83 kg)  12/17/22 183 lb (83 kg)  09/17/22 194 lb 12.8 oz (88.4 kg)      ASSESSMENT AND PLAN:  1  CAD  Mild by cath in 2020  Remains asymptomatic   2   HL   Will  check lipomed, Lpa and ApoB  3  BP  BP controlled  on current regimen    Encouraged her to check it periodically   If high may need another agent   Would get RA USN to rule out RAS if that happens   4  Diet  Minimize carbs and processed foods      Current medicines are reviewed at length with the patient today.  The patient does not have concerns regarding medicines.  Signed, Sarah Pates, MD  03/11/2023 5:23 PM  [facility] [address] [place], [place], [place]  [address] Phone: [phone]; Fax: [phone]

## 2023-03-11 ENCOUNTER — Ambulatory Visit: Payer: Self-pay | Attending: Internal Medicine | Admitting: Internal Medicine

## 2023-03-11 ENCOUNTER — Ambulatory Visit: Payer: Self-pay | Admitting: Internal Medicine

## 2023-03-11 ENCOUNTER — Other Ambulatory Visit (HOSPITAL_COMMUNITY)
Admission: RE | Admit: 2023-03-11 | Discharge: 2023-03-11 | Disposition: A | Discharge status: Home / Self Care | Payer: Self-pay | Source: Ambulatory Visit | Attending: Internal Medicine | Admitting: Internal Medicine

## 2023-03-11 VITALS — BP 124/78 | HR 61 | Ht 64.5 in | Wt 183.0 lb

## 2023-03-11 DIAGNOSIS — E782 Mixed hyperlipidemia: Secondary | ICD-10-CM

## 2023-03-11 DIAGNOSIS — I10 Essential (primary) hypertension: Secondary | ICD-10-CM

## 2023-03-11 LAB — HEPATIC FUNCTION PANEL
ALT: 33 U/L (ref 0–44)
AST: 30 U/L (ref 15–41)
Albumin: 4.1 g/dL (ref 3.5–5.0)
Alkaline Phosphatase: 119 U/L (ref 38–126)
Bilirubin, Direct: <0.1 mg/dL (ref 0.0–0.2)
Total Bilirubin: 0.6 mg/dL (ref 0.3–1.2)
Total Protein: 8.6 g/dL — ABNORMAL HIGH (ref 6.5–8.1)

## 2023-03-11 MED ORDER — EZETIMIBE 10 MG PO TABS: ORAL_TABLET | ORAL | 3 refills | Status: DC

## 2023-03-11 NOTE — Patient Instructions (Signed)
Medication Instructions:  Your physician recommends that you continue on your current medications as directed. Please refer to the Current Medication list given to you today.  *If you need a refill on your cardiac medications before your next appointment, please call your pharmacy*   Lab Work: Your physician recommends that you return for lab work in: Today   If you have labs (blood work) drawn today and your tests are completely normal, you will receive your results only by: MyChart Message (if you have MyChart) OR A paper copy in the mail If you have any lab test that is abnormal or we need to change your treatment, we will call you to review the results.   Testing/Procedures: NONE    Follow-Up: At [facility], you and your health needs are our priority.  As part of our continuing mission to provide you with exceptional heart care, we have created designated Provider Care Teams.  These Care Teams include your primary Cardiologist (physician) and Advanced Practice Providers (APPs -  Physician Assistants and Nurse Practitioners) who all work together to provide you with the care you need, when you need it.  We recommend signing up for the patient portal called "MyChart".  Sign up information is provided on this After Visit Summary.  MyChart is used to connect with patients for Virtual Visits (Telemedicine).  Patients are able to view lab/test results, encounter notes, upcoming appointments, etc.  Non-urgent messages can be sent to your provider as well.   To learn more about what you can do with MyChart, go to [email or web address].    Your next appointment:    [place]   Provider:   You may see Dietrich Pates, MD or one of the following Advanced Practice Providers on your designated Care Team:   Randall An, PA-C  Jacolyn Reedy, PA-C     Other Instructions Thank you for choosing [facility]!

## 2023-03-13 ENCOUNTER — Encounter: Payer: Self-pay | Admitting: Internal Medicine

## 2023-03-14 NOTE — Telephone Encounter (Signed)
Spoke with pt   Waitnng on all labs back

## 2023-03-16 ENCOUNTER — Telehealth: Payer: Self-pay | Admitting: Internal Medicine

## 2023-03-16 NOTE — Telephone Encounter (Signed)
Pt returning call for lab results  

## 2023-03-17 NOTE — Telephone Encounter (Signed)
If Repatha is cost prohibitive she can try to apply for patient assistance.    The form can be found at:  [email or web address]  She can also search for [employer] and click on Repatha

## 2023-03-17 NOTE — Telephone Encounter (Signed)
I sent patient the message with website info and asked her if this is something she wants to pursue and to let me know.

## 2023-03-17 NOTE — Telephone Encounter (Signed)
Sarah Riffle, MD 03/15/2023 10:04 PM EDT     Lipids   LDL is 68     Lpa is a high  (protein associated with LDL) GIven this I would recomm trying to lower LDL further to 55 I woulld recomm Repatha  every 2 weeks COntinue Lipitor    Stop Zetia Follow up lipomed in 10 wks with Lpa as well and liver panel    I spoke with patient and she is hesitant to start Repatha as she has no insurance. I will forward to PharmD for suggestions.

## 2023-03-24 ENCOUNTER — Other Ambulatory Visit: Payer: Self-pay

## 2023-03-24 DIAGNOSIS — I2692 Saddle embolus of pulmonary artery without acute cor pulmonale: Secondary | ICD-10-CM

## 2023-03-25 ENCOUNTER — Inpatient Hospital Stay: Payer: Self-pay | Attending: Hematology

## 2023-03-25 DIAGNOSIS — I2699 Other pulmonary embolism without acute cor pulmonale: Secondary | ICD-10-CM | POA: Insufficient documentation

## 2023-03-25 DIAGNOSIS — Z818 Family history of other mental and behavioral disorders: Secondary | ICD-10-CM | POA: Insufficient documentation

## 2023-03-25 DIAGNOSIS — Z79899 Other long term (current) drug therapy: Secondary | ICD-10-CM | POA: Insufficient documentation

## 2023-03-25 DIAGNOSIS — Z8349 Family history of other endocrine, nutritional and metabolic diseases: Secondary | ICD-10-CM | POA: Insufficient documentation

## 2023-03-25 DIAGNOSIS — Z86718 Personal history of other venous thrombosis and embolism: Secondary | ICD-10-CM | POA: Insufficient documentation

## 2023-03-25 DIAGNOSIS — I2692 Saddle embolus of pulmonary artery without acute cor pulmonale: Secondary | ICD-10-CM

## 2023-03-25 DIAGNOSIS — Z8249 Family history of ischemic heart disease and other diseases of the circulatory system: Secondary | ICD-10-CM | POA: Insufficient documentation

## 2023-03-25 DIAGNOSIS — Z882 Allergy status to sulfonamides status: Secondary | ICD-10-CM | POA: Insufficient documentation

## 2023-03-25 DIAGNOSIS — E785 Hyperlipidemia, unspecified: Secondary | ICD-10-CM | POA: Insufficient documentation

## 2023-03-25 DIAGNOSIS — Z841 Family history of disorders of kidney and ureter: Secondary | ICD-10-CM | POA: Insufficient documentation

## 2023-03-25 DIAGNOSIS — Z7901 Long term (current) use of anticoagulants: Secondary | ICD-10-CM | POA: Insufficient documentation

## 2023-03-25 DIAGNOSIS — Z86711 Personal history of pulmonary embolism: Secondary | ICD-10-CM | POA: Insufficient documentation

## 2023-03-25 DIAGNOSIS — Z9049 Acquired absence of other specified parts of digestive tract: Secondary | ICD-10-CM | POA: Insufficient documentation

## 2023-03-25 DIAGNOSIS — Z888 Allergy status to other drugs, medicaments and biological substances status: Secondary | ICD-10-CM | POA: Insufficient documentation

## 2023-03-25 LAB — CBC WITH DIFFERENTIAL/PLATELET
Abs Immature Granulocytes: 0.02 10*3/uL (ref 0.00–0.07)
Basophils Absolute: 0 10*3/uL (ref 0.0–0.1)
Basophils Relative: 1 %
Eosinophils Absolute: 0.1 10*3/uL (ref 0.0–0.5)
Eosinophils Relative: 2 %
HCT: 39.9 % (ref 36.0–46.0)
Hemoglobin: 13.3 g/dL (ref 12.0–15.0)
Immature Granulocytes: 0 %
Lymphocytes Relative: 34 %
Lymphs Abs: 2.1 10*3/uL (ref 0.7–4.0)
MCH: 32.2 pg (ref 26.0–34.0)
MCHC: 33.3 g/dL (ref 30.0–36.0)
MCV: 96.6 fL (ref 80.0–100.0)
Monocytes Absolute: 0.4 10*3/uL (ref 0.1–1.0)
Monocytes Relative: 7 %
Neutro Abs: 3.6 10*3/uL (ref 1.7–7.7)
Neutrophils Relative %: 56 %
Platelets: 244 10*3/uL (ref 150–400)
RBC: 4.13 MIL/uL (ref 3.87–5.11)
RDW: 12.9 % (ref 11.5–15.5)
WBC: 6.3 10*3/uL (ref 4.0–10.5)
nRBC: 0 % (ref 0.0–0.2)

## 2023-03-25 LAB — D-DIMER, QUANTITATIVE: D-Dimer, Quant: <0.27 ug{FEU}/mL (ref 0.00–0.50)

## 2023-04-01 ENCOUNTER — Inpatient Hospital Stay (HOSPITAL_BASED_OUTPATIENT_CLINIC_OR_DEPARTMENT_OTHER): Payer: Self-pay | Admitting: Oncology

## 2023-04-01 ENCOUNTER — Ambulatory Visit: Payer: Self-pay | Admitting: Physician Assistant

## 2023-04-01 VITALS — BP 123/85 | HR 67 | Temp 97.7°F | Resp 16 | Wt 183.0 lb

## 2023-04-01 DIAGNOSIS — I2692 Saddle embolus of pulmonary artery without acute cor pulmonale: Secondary | ICD-10-CM

## 2023-04-01 DIAGNOSIS — Z7901 Long term (current) use of anticoagulants: Secondary | ICD-10-CM

## 2023-04-01 NOTE — Progress Notes (Signed)
[facility] [address], [place] [address]   CLINIC:  Medical Oncology/Hematology  PCP:  Willow Ora, MD [address] [place] [address] [phone]   REASON FOR VISIT:  Follow-up for unprovoked DVT and PE  PRIOR THERAPY: None  CURRENT THERAPY: Xarelto  INTERVAL HISTORY:  Sarah Chung 62 y.o. 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  She was last seen by Rojelio Brenner, PA-C on 03/24/2022.  At today's visit, she reports doing well.  Appetite and energy levels are 100%.  She denies any pain.  Since her last visit, she denies any hospitalizations, surgeries or deviation from baseline.  She continues to take her Xarelto and has not missed any doses.  She denies any bleeding events on Xarelto. She denies any leg swelling, pain, or erythema.  No shortness of breath, dyspnea on exertion, chest pain, cough, hemoptysis, or palpitations.   Reports her PCP would like to put her on Farxiga for elevated lipoprotein A.  She is currently on Lipitor 80 mg daily and 10 mg Zetia.  She is reluctant to take Marcelline Deist given it is expensive and all of her other lab values appear to be fine.  She would like our opinion on starting a new medication.  REVIEW OF SYSTEMS:   Review of Systems  All other systems reviewed and are negative.    PAST MEDICAL/SURGICAL HISTORY:  Past Medical History:  Diagnosis Date   Allergy    Calculus of gallbladder with acute cholecystitis, without mention of obstruction 07/03/2013   Clotting disorder (HCC)    DVT (deep vein thrombosis) in pregnancy    Gallstones    Hyperlipidemia    Pulmonary embolism (HCC)    Seizures (HCC)    Past Surgical History:  Procedure Laterality Date   CHOLECYSTECTOMY N/A 07/02/2013   Procedure: LAPAROSCOPIC CHOLECYSTECTOMY WITH INTRAOPERATIVE CHOLANGIOGRAM;  Surgeon: Ernestene Mention, MD;  Location: [facility];  Service: General;  Laterality: N/A;    LEFT HEART CATH AND CORONARY ANGIOGRAPHY N/A 04/20/2019   Procedure: LEFT HEART CATH AND CORONARY ANGIOGRAPHY;  Surgeon: Lyn Records, MD;  Location: [facility];  Service: Cardiovascular;  Laterality: N/A;     SOCIAL HISTORY:  Social History   Socioeconomic History   Marital status: Divorced    Spouse name: [name]   Number of children: 2   Years of education: [employer] education level: Bachelor's degree (e.g., BA, AB, BS)  Occupational History   Occupation: Musician: [employer]  Tobacco Use   Smoking status: Never   Smokeless tobacco: Never  Vaping Use   Vaping status: Never Used  Substance and Sexual Activity   Alcohol use: No   Drug use: No   Sexual activity: Yes    Birth control/protection: Post-menopausal  Other Topics Concern   Not on file  Social History Narrative   Patient is married ([name]) and lives at home with his husband.   Patient has two adult children.   Patient is working full-time.   Patient has a college education.   Patient is right-handed.   Patient drinks two cups of coffee and some soda.   Social Determinants of Health   Financial Resource Strain: Not on file  Food Insecurity: No Food Insecurity (12/17/2022)   Hunger Vital Sign    Worried About Running Out of Food in the Last Year: Never true    Ran Out of  Food in the Last Year: Never true  Transportation Needs: No Transportation Needs (12/17/2022)   PRAPARE - Administrator, Civil Service (Medical): No    Lack of Transportation (Non-Medical): No  Physical Activity: Sufficiently Active (12/17/2022)   Exercise Vital Sign    Days of Exercise per Week: 7 days    Minutes of Exercise per Session: 70 min  Stress: No Stress Concern Present (12/17/2022)   [employer] of Occupational Health - Occupational Stress Questionnaire    Feeling of Stress : Not at all  Social Connections: Unknown (12/17/2022)   Social Connection and Isolation Panel [NHANES]     Frequency of Communication with Friends and Family: More than three times a week    Frequency of Social Gatherings with Friends and Family: Three times a week    Attends Religious Services: More than 4 times per year    Active Member of Clubs or Organizations: Not on file    Attends Banker Meetings: Not on file    Marital Status: Divorced  Intimate Partner Violence: Not on file    FAMILY HISTORY:  Family History  Problem Relation Age of Onset   Heart attack Father    Parkinson's disease Father    Heart disease Father    Heart disease Brother    Heart attack Brother    Mental illness Mother    [name] disease Mother    Depression Sister    Kidney disease Maternal Grandfather    Heart attack Paternal Grandmother     CURRENT MEDICATIONS:  Outpatient Encounter Medications as of 04/01/2023  Medication Sig   atorvastatin (LIPITOR) 80 MG tablet TAKE 1 TABLET(80 MG) BY MOUTH DAILY   ezetimibe (ZETIA) 10 MG tablet TAKE 1 TABLET(10 MG) BY MOUTH DAILY   olmesartan-hydrochlorothiazide (BENICAR HCT) 20-12.5 MG tablet Take 1 tablet by mouth daily.   PHENobarbital (LUMINAL) 64.8 MG tablet Take 1.5 tablets (97.2 mg total) by mouth at bedtime.   XARELTO 20 MG TABS tablet TAKE 1 TABLET(20 MG) BY MOUTH DAILY WITH SUPPER   [DISCONTINUED] cetirizine (ZYRTEC) 10 MG tablet Take 1 tablet (10 mg total) by mouth at bedtime.   No facility-administered encounter medications on file as of 04/01/2023.    ALLERGIES:  Allergies  Allergen Reactions   Tegretol [Carbamazepine] Hives   Ace Inhibitors Cough   Keppra [Levetiracetam]     Feels outside her body.[name]   Ketorolac Tromethamine    Sulfa Antibiotics Hives   Lisinopril-Hydrochlorothiazide Cough     PHYSICAL EXAM:   ECOG PERFORMANCE STATUS: 0 - Asymptomatic  Vitals:   04/01/23 0901  BP: 123/85  Pulse: 67  Resp: 16  Temp: 97.7 F (36.5 C)  SpO2: 99%   Filed Weights   04/01/23 0901  Weight: 182 lb 15.7 oz (83 kg)    Physical Exam Constitutional:      Appearance: Normal appearance.  Cardiovascular:     Rate and Rhythm: Normal rate and regular rhythm.  Pulmonary:     Effort: Pulmonary effort is normal.     Breath sounds: Normal breath sounds.  Abdominal:     General: Bowel sounds are normal.     Palpations: Abdomen is soft.  Musculoskeletal:        General: No swelling. Normal range of motion.  Neurological:     Mental Status: She is alert and oriented to person, place, and time. Mental status is at baseline.      LABORATORY DATA:  I have reviewed the labs as  listed.  CBC    Component Value Date/Time   WBC 6.3 03/25/2023 0754   RBC 4.13 03/25/2023 0754   HGB 13.3 03/25/2023 0754   HCT 39.9 03/25/2023 0754   PLT 244 03/25/2023 0754   MCV 96.6 03/25/2023 0754   MCH 32.2 03/25/2023 0754   MCHC 33.3 03/25/2023 0754   RDW 12.9 03/25/2023 0754   LYMPHSABS 2.1 03/25/2023 0754   MONOABS 0.4 03/25/2023 0754   EOSABS 0.1 03/25/2023 0754   BASOSABS 0.0 03/25/2023 0754      Latest Ref Rng & Units 03/11/2023   10:59 AM 12/17/2022   10:23 AM 06/23/2022   10:22 AM  CMP  Glucose 70 - 99 mg/dL  92  86   BUN 6 - 23 mg/dL  32  17   Creatinine 1.61 - 1.20 mg/dL  0.96  0.45   Sodium 409 - 145 mEq/L  139  139   Potassium 3.5 - 5.1 mEq/L  5.1  4.3   Chloride 96 - 112 mEq/L  101  101   CO2 19 - 32 mEq/L  28  28   Calcium 8.4 - 10.5 mg/dL  9.5  9.3   Total Protein 6.5 - 8.1 g/dL 8.6   8.0   Total Bilirubin 0.3 - 1.2 mg/dL 0.6   0.4   Alkaline Phos 38 - 126 U/L 119   133   AST 15 - 41 U/L 30   20   ALT 0 - 44 U/L 33   27     DIAGNOSTIC IMAGING:  I have independently reviewed the relevant imaging and discussed with the patient.  ASSESSMENT & PLAN: 1.  Unprovoked DVT and PE: - Presentation to the ER on 02/08/2019 with right leg pain and shortness of breath on exertion, started 2 to 3 days ago. - Ultrasound of the lower extremities on 02/08/2019 was positive for acute occlusive DVT in the right  popliteal and calf veins. - CT of the chest PE protocol on 02/08/2019 shows bulky bilateral clot burden and (left eccentric) saddle embolus.  CT evidence of heart strain. - She was admitted from 02/08/2019 through 02/10/2019 at [facility], started on anticoagulation, transitioned to Xarelto. - She had a history of left leg DVT 1993, few days after a C-section.  She was on limited duration Coumadin at that time. - She did not have any instigating factors prior to the latest DVT and PE. - 2D echo on 02/09/2019 shows ejection fraction of 60 to 65% with normal right ventricular cavity. - At time of diagnosis, Dr. 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  Based on her recent DVT and pulmonary embolism with clot burden, he recommended indefinite anticoagulation.  No indication for further hypercoagulable testing, as it would not affect the duration of anticoagulation. - She did not have any history of miscarriages.  She is a non-smoker.  No family history of DVT or malignancies. - She is up-to-date on her recommended age-appropriate cancer screenings: She had a colonoscopy on 02/02/2018 which showed no endoscopic evidence of bleeding, diverticula, mass, polyps or tumor in the entire colon.  She also has yearly mammograms which were normal.  No B symptoms or any indication of occult malignancy.   - Most recent D-dimer is < 0.27.  - She remains on Xarelto and is tolerating it well without any bleeding events   - No signs or symptoms of recurrent  VTE    2.  Hyperlipidemia: -Currently on 80 mg Lipitor and 10 mg Zetia.  -PCP would like to start her on Farxiga. -She is reluctant secondary to expense and adding an additional medication.  PLAN SUMMARY: >> Continue Xarelto. >> Return to clinic in 1 year for follow-up with labs (CBC, D-dimer).       All questions were answered. The patient knows to call the  clinic with any problems, questions or concerns.  Medical decision making: Low   Time spent on visit:  I spent 25 minutes dedicated to the care of this patient (face-to-face and non-face-to-face) on the date of the encounter to include what is described in the assessment and plan.   Mauro Kaufmann, NP  03/24/2022 8:41 AM

## 2023-05-02 ENCOUNTER — Ambulatory Visit (INDEPENDENT_AMBULATORY_CARE_PROVIDER_SITE_OTHER): Payer: Self-pay | Admitting: Family Medicine

## 2023-05-02 ENCOUNTER — Encounter: Payer: Self-pay | Admitting: Family Medicine

## 2023-05-02 ENCOUNTER — Other Ambulatory Visit: Payer: Self-pay

## 2023-05-02 VITALS — BP 138/86 | HR 76 | Ht 64.5 in | Wt 186.0 lb

## 2023-05-02 DIAGNOSIS — M25572 Pain in left ankle and joints of left foot: Secondary | ICD-10-CM

## 2023-05-02 MED ORDER — PREDNISONE 50 MG PO TABS: 50.0000 mg | ORAL_TABLET | Freq: Every day | ORAL | 0 refills | Status: DC

## 2023-05-02 NOTE — Patient Instructions (Addendum)
Thank you for coming in today.  Plan for ASO lace up ankle brace.   Your backup plan is to use a cam walker boot if you are miserable.   Do the home exercises 10-30 reps 1-3x daily.   Please use Voltaren gel (Generic Diclofenac Gel) up to 4x daily for pain as needed.  This is available over-the-counter as both the name brand Voltaren gel and the generic diclofenac gel.   I can do an injection any time.   Take the prednisone for 5 days. It will reave you up.  Ok to stop it early or cut the dose back if it makes you feel bad.

## 2023-05-02 NOTE — Progress Notes (Signed)
I, Stevenson Clinch, CMA acting as a scribe for Clementeen Graham, MD. Sarah Chung is a 62 y.o. female who presents to [facility] today for left ankle pain. Pt was last seen by Dr. Denyse Amass on 03/01/22 for left hand pain and forearm mass.   Today, pt reports left ankle pain since early Sept 2024. Walks several miles daily. Stood up from the bed and suddenly felt intense pain at the the front and lateral aspects of the ankle. Pain with WB and ambulation, has to crawl up the stairs sometime d/t pain. Has tried ice, heat, Tylenol, Voltaren, and Bengay with no relief. Notes doing a lot of walking around [place] after sx started. Had imaging done at Urgent Care in [place], advised negative. Urgent Care sent in a muscle relaxer but has not taken. Has upcoming show 1 week from Friday.  She notes she did have an x-ray at the urgent care which was reportedly normal.  Denies swelling or bruising.     Pertinent review of systems: No fevers or chills  Relevant historical information: Works as an Radio producer   Exam:  BP 138/86   Pulse 76   Ht 5' 4.5" (1.638 m)   Wt 186 lb (84.4 kg)   SpO2 98%   BMI 31.43 kg/m  General: Well Developed, well nourished, and in no acute distress.   MSK: Left ankle minimal swelling lateral ankle otherwise normal. Normal foot and ankle motion. Strength is limited foot eversion with pain felt at the lateral ankle. Stable ligamentous exam. Pulses cap refill and sensation are intact distally.    Lab and Radiology Results  Diagnostic Limited MSK Ultrasound of: Ankle Lateral ankle joint no significant effusion is visible.  Normal lateral talofibular joint structures. Peroneal tendons are intact but mild hypoechoic fluid is present around the peroneal tendon at the area inferior to the lateral malleolus.  This area is tender to palpation. Impression: Peroneal tenosynovitis      Assessment and Plan: 62 y.o. female with left lateral ankle pain  worse after doing a lot of walking to [employer].  Etiology is somewhat unclear.  I think she does have some component of peroneal tenosynovitis.  She may have a more internal ankle problem that I just cannot see on ultrasound today.  X-rays done in urgent care which I do not have access to her were reportedly normal.  Plan for home exercise program taught in clinic today.  Plan for ASO ankle brace and/or cam walker boot if needed.  We do short course of prednisone.  She would like to avoid injection today which I think is reasonable.  She has a show coming up in about 2 weeks that she needs to be ready for.  Happy to do an injection anytime.  Physical therapy I think would be helpful but she does not have health insurance so it is not affordable.   PDMP not reviewed this encounter. Orders Placed This Encounter  Procedures   [place] LIMITED JOINT SPACE STRUCTURES LOW LEFT(NO LINKED CHARGES)    Order Specific Question:   Reason for Exam (SYMPTOM  OR DIAGNOSIS REQUIRED)    Answer:   left ankle pain    Order Specific Question:   Preferred imaging location?    Answer:   [facility] ordered this encounter  Medications   predniSONE (DELTASONE) 50 MG tablet    Sig: Take 1 tablet (50 mg total) by mouth daily.  Dispense:  5 tablet    Refill:  0     Discussed warning signs or symptoms. Please see discharge instructions. Patient expresses understanding.   The above documentation has been reviewed and is accurate and complete Clementeen Graham, M.D.

## 2023-05-03 ENCOUNTER — Other Ambulatory Visit: Payer: Self-pay | Admitting: Neurology

## 2023-05-03 DIAGNOSIS — Z5320 Procedure and treatment not carried out because of patient's decision for unspecified reasons: Secondary | ICD-10-CM

## 2023-05-03 MED ORDER — PHENOBARBITAL 64.8 MG PO TABS
97.2000 mg | ORAL_TABLET | Freq: Every day | ORAL | 1 refills | Status: DC
Start: 2023-05-03 — End: 2023-08-01

## 2023-05-03 NOTE — Telephone Encounter (Signed)
Pt request refill for PHENobarbital (LUMINAL) 64.8 MG tablet sent to [facility] [phone]

## 2023-05-08 ENCOUNTER — Other Ambulatory Visit: Payer: Self-pay | Admitting: Internal Medicine

## 2023-05-08 DIAGNOSIS — I2692 Saddle embolus of pulmonary artery without acute cor pulmonale: Secondary | ICD-10-CM

## 2023-05-09 NOTE — Telephone Encounter (Signed)
Prescription refill request for Xarelto received.  Indication:pe Last office visit:8/24 Weight:84.4  kg Age:62 Scr:1.14  5/24 CrCl:67.3  ml/min  Prescription refilled

## 2023-05-16 ENCOUNTER — Encounter: Payer: Self-pay | Admitting: Family Medicine

## 2023-05-16 ENCOUNTER — Ambulatory Visit (INDEPENDENT_AMBULATORY_CARE_PROVIDER_SITE_OTHER): Payer: Self-pay | Admitting: Family Medicine

## 2023-05-16 ENCOUNTER — Telehealth: Payer: Self-pay | Admitting: Family Medicine

## 2023-05-16 VITALS — BP 128/74 | HR 87 | Temp 98.1°F | Ht 64.5 in | Wt 184.6 lb

## 2023-05-16 DIAGNOSIS — R29898 Other symptoms and signs involving the musculoskeletal system: Secondary | ICD-10-CM

## 2023-05-16 DIAGNOSIS — M5431 Sciatica, right side: Secondary | ICD-10-CM

## 2023-05-16 DIAGNOSIS — M25572 Pain in left ankle and joints of left foot: Secondary | ICD-10-CM

## 2023-05-16 NOTE — Telephone Encounter (Signed)
Patient called stating she tried to make an appointment with Dr. Vickey Huger as advised by PCP but was informed she needed a new referral. Caller states that the office said a new referral is needed because it's a new issue.    [facility]  Dr. Porfirio Mylar Dohmeier  [phone] [place], [place] [address] Phone: [phone] Fax: [phone]

## 2023-05-16 NOTE — Progress Notes (Signed)
Subjective  CC:  Chief Complaint  Patient presents with   Hypertension   Leg Pain    Pt c/o of left leg pain, pt noticing that she is working slower     HPI: Sarah Chung is a 62 y.o. female who presents to the office today to address the problems listed above in the chief complaint. 62 year old complains of difficulty with low back pain, left ankle pain and feeling tired in her legs.  This started several months back when she had a fall, landing on her right hip.  Says has had some sciatica pain since.  Then awoke with acute severe left ankle pain.  Had evaluation by sports medicine.  This eventually resolved, unclear diagnosis.  Prednisone was used for both hip pain and ankle pain and it helped both.  Now however she is very concerned because she feels like her legs get heavy.  Times when she is walking they will feel difficult to lift.  The symptoms come and go.  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  She is concerned about ALS or multiple sclerosis.  She has no other neurologic deficits.  No headaches.  She is tearful.  No claudication.  She describes numbness and tingling in her upper back but no paresthesias in her lower extremities.  No bowel or bladder incontinence.  Assessment  1. Leg heaviness   2. Acute left ankle pain   3. Right sided sciatica      Plan  Leg heaviness: Patient's neurologic exam was fluctuating.  Had trouble walking at her heels and toes but seem to have fairly good strength when I tested her.  Given her level of anxiety and vague symptoms, I recommend evaluation by neurology.  Can get EMG/NCV testing if needed.  DTRs were normal.  She is very anxious.  Also discussed possibility of statin intolerance.  Recommend holding this for the next month.  Recent lab work was significant for a low vitamin D.  She is on supplements now. Ankle pain: Fortunately this has resolved.   Possible gout or other. Right sciatica: Follow-up with neurology, but if this persist, would recommend physical therapy.  Patient is hesitant because of cost.  She did also follow-up with sports medicine.  Follow up: As scheduled for complete physical 06/22/2023  No orders of the defined types were placed in this encounter.  No orders of the defined types were placed in this encounter.     I reviewed the patients updated PMH, FH, and SocHx.    Patient Active Problem List   Diagnosis Date Noted   Essential hypertension 09/20/2022    Priority: High   Depression, major, single episode, moderate (HCC) 09/01/2022    Priority: High   Chronic anticoagulation 06/22/2021    Priority: High   Seizure disorder (HCC) 08/27/2020    Priority: High   Mixed hyperlipidemia 06/20/2020    Priority: High   Obesity 06/20/2020    Priority: High   Nonobstructive atherosclerosis of coronary artery 04/20/2019    Priority: High   Pulmonary embolism (HCC) x2 02/09/2019    Priority: High   Osteopenia 12/17/2022    Priority: Medium    Current Meds  Medication Sig   atorvastatin (LIPITOR) 80 MG tablet TAKE 1 TABLET(80 MG) BY MOUTH DAILY   ezetimibe (ZETIA) 10 MG tablet TAKE 1 TABLET(10 MG) BY MOUTH DAILY  olmesartan-hydrochlorothiazide (BENICAR HCT) 20-12.5 MG tablet Take 1 tablet by mouth daily.   PHENobarbital (LUMINAL) 64.8 MG tablet Take 1.5 tablets (97.2 mg total) by mouth at bedtime.   XARELTO 20 MG TABS tablet TAKE 1 TABLET(20 MG) BY MOUTH DAILY WITH SUPPER    Allergies: Patient is allergic to tegretol [carbamazepine], ace inhibitors, keppra [levetiracetam], ketorolac tromethamine, sulfa antibiotics, and lisinopril-hydrochlorothiazide. Family History: Patient family history includes Depression in her sister; [name] disease in her mother; Heart attack in her brother, father, and paternal grandmother; Heart disease in her brother and father; Kidney disease in her maternal grandfather; Mental  illness in her mother; Parkinson's disease in her father. Social History:  Patient  reports that she has never smoked. She has never used smokeless tobacco. She reports that she does not drink alcohol and does not use drugs.  Review of Systems: Constitutional: Negative for fever malaise or anorexia Cardiovascular: negative for chest pain Respiratory: negative for SOB or persistent cough Gastrointestinal: negative for abdominal pain  Objective  Vitals: BP 128/74   Pulse 87   Temp 98.1 F (36.7 C)   Ht 5' 4.5" (1.638 m)   Wt 184 lb 9.6 oz (83.7 kg)   SpO2 97%   BMI 31.20 kg/m  General:A&Ox3 Psych: Very anxious, tearful while interviewed. Neuro: Lower extremity DTRs +2 and symmetric bilaterally, strength 5 out of 5 when tested manually but having difficulty walking on toes and heels.  Ataxic gait when asked to walk.  Commons side effects, risks, benefits, and alternatives for medications and treatment plan prescribed today were discussed, and the patient expressed understanding of the given instructions. Patient is instructed to call or message via MyChart if he/she has any questions or concerns regarding our treatment plan. No barriers to understanding were identified. We discussed Red Flag symptoms and signs in detail. Patient expressed understanding regarding what to do in case of urgent or emergency type symptoms.  Medication list was reconciled, printed and provided to the patient in AVS. Patient instructions and summary information was reviewed with the patient as documented in the AVS. This note was prepared with assistance of Dragon voice recognition software. Occasional wrong-word or sound-a-like substitutions may have occurred due to the inherent limitations of voice recognition software

## 2023-06-01 ENCOUNTER — Telehealth (HOSPITAL_BASED_OUTPATIENT_CLINIC_OR_DEPARTMENT_OTHER): Payer: Self-pay | Admitting: *Deleted

## 2023-06-01 NOTE — Telephone Encounter (Signed)
   Name: Sarah Chung  DOB: [birth year]  MRN: [id number]  Primary Cardiologist: Dietrich Pates, MD   Preoperative team, please contact this patient and set up a phone call appointment for further preoperative risk assessment. Please obtain consent and complete medication review. Thank you for your help.  I confirm that guidance regarding antiplatelet and oral anticoagulation therapy has been completed and, if necessary, noted below.  Per office protocol, patient can hold Xarelto for 1 day prior to procedure.   I also confirmed the patient resides in the state of [place]. As per [facility] Medical Board telemedicine laws, the patient must reside in the state in which the provider is licensed.   Ronney Asters, NP 06/01/2023, 2:52 PM [facility]

## 2023-06-01 NOTE — Telephone Encounter (Signed)
I s/w the pt and she said she GI just needs clearance for the blood thinner. She also states that Dr. Tenny Craw is not the prescriber of the Xarelto, that this is being managed by there Hem/Oncologist. She does not understand how we became involved in refilling Xarelto or getting assistance paperwork for Xarelto filled out by Dr. Tenny Craw.   I assured her that I will d/w the pre op APP further. I do see notes from Pharm-d as well.

## 2023-06-01 NOTE — Telephone Encounter (Signed)
Patient with diagnosis of unprovoked DVT and PE in 2020 and DVT during pregnancy in 1993 on Xarelto for anticoagulation.  Heme onc managing but we have refilled med as well.  Procedure: colonoscopy Date of procedure: 06/21/23  CrCl 50mL/min using adjusted body weight Platelet count 244K  Per office protocol, patient can hold Xarelto for 1 day prior to procedure.    **This guidance is not considered finalized until pre-operative APP has relayed final recommendations.**

## 2023-06-01 NOTE — Telephone Encounter (Signed)
Preoperative team, it appears that both cardiology and Heme onc have been postop and with her Xarelto.  Recommendations have been provided by clinical pharmacist.  No further steps need to be taken at this time.  Approval has been addressed.  Thomasene Ripple. [place] [employer] NP-C     06/01/2023, 3:52 PM [facility] [address] Office [phone] Fax [phone]

## 2023-06-01 NOTE — Telephone Encounter (Signed)
   Pre-operative Risk Assessment    Patient Name: Sarah Chung  DOB: January 10, 1961 MRN: [id number]      Request for Surgical Clearance    Procedure:   Colonoscopy   Date of Surgery:  Clearance 06/21/23                                 Surgeon:  Dr. Kerin Salen Surgeon's Group or Practice Name:  [facility]  Phone number:  [phone] Fax number:  [phone]   Type of Clearance Requested:   - Medical  - Pharmacy:  Hold Rivaroxaban (Xarelto) Not iindicated.   Type of Anesthesia:   Propofol   Additional requests/questions:    Signed, Emmit Pomfret   06/01/2023, 1:21 PM

## 2023-06-01 NOTE — Telephone Encounter (Signed)
I will fax clearance notes to requesting office.

## 2023-06-02 ENCOUNTER — Telehealth: Payer: Self-pay | Admitting: Emergency Medicine

## 2023-06-02 ENCOUNTER — Ambulatory Visit
Admission: RE | Admit: 2023-06-02 | Discharge: 2023-06-02 | Disposition: A | Discharge status: Home / Self Care | Payer: Self-pay | Source: Ambulatory Visit | Attending: Family Medicine | Admitting: Family Medicine

## 2023-06-02 VITALS — BP 138/83 | HR 68 | Temp 98.1°F | Resp 16

## 2023-06-02 DIAGNOSIS — H6991 Unspecified Eustachian tube disorder, right ear: Secondary | ICD-10-CM

## 2023-06-02 MED ORDER — METHYLPREDNISOLONE 16 MG PO TABS: 16.0000 mg | ORAL_TABLET | Freq: Every day | ORAL | 0 refills | Status: DC

## 2023-06-02 MED ORDER — METHYLPREDNISOLONE 8 MG PO TABS: 16.0000 mg | ORAL_TABLET | Freq: Every day | ORAL | 0 refills | Status: AC

## 2023-06-02 NOTE — ED Triage Notes (Signed)
Pt c/o right side sore throat and right ear pain. X 1 day

## 2023-06-02 NOTE — Telephone Encounter (Signed)
Pharmacy called and reported dose not available. Provider aware, original prescription d/c. Pharmacy reported would cancel initial prescription on their end. Provider placed new prescription.

## 2023-06-02 NOTE — Discharge Instructions (Signed)
Take Flonase twice daily, you may add an antihistamine daily in case seasonal allergies are playing a role in your current symptoms.  I have also sent in a short course of steroid to help with your current symptoms.  Follow-up if symptoms are significantly worsening.

## 2023-06-02 NOTE — ED Provider Notes (Signed)
[facility]    CSN: [id number] Arrival date & time: 06/02/23  0948      History   Chief Complaint Chief Complaint  Patient presents with   Sore Throat    I also have an ear ache. - Entered by patient    HPI Sarah Chung is a 62 y.o. female.   Patient presenting today with new onset right-sided ear pain and pressure that started overnight.  She states it woke her up out of her sleep with throbbing pain.  Denies fever, chills, drainage, headache, nasal congestion, sinus pressure, cough.  So far not tried anything over-the-counter for symptoms.  Does have a history of seasonal allergies not currently on anything for this.    Past Medical History:  Diagnosis Date   Allergy    Calculus of gallbladder with acute cholecystitis, without mention of obstruction 07/03/2013   Clotting disorder (HCC)    DVT (deep vein thrombosis) in pregnancy    Gallstones    Hyperlipidemia    Pulmonary embolism (HCC)    Seizures (HCC)     Patient Active Problem List   Diagnosis Date Noted   Osteopenia 12/17/2022   Essential hypertension 09/20/2022   Depression, major, single episode, moderate (HCC) 09/01/2022   Chronic anticoagulation 06/22/2021   Seizure disorder (HCC) 08/27/2020   Mixed hyperlipidemia 06/20/2020   Obesity 06/20/2020   Nonobstructive atherosclerosis of coronary artery 04/20/2019   Pulmonary embolism (HCC) x2 02/09/2019    Past Surgical History:  Procedure Laterality Date   CHOLECYSTECTOMY N/A 07/02/2013   Procedure: LAPAROSCOPIC CHOLECYSTECTOMY WITH INTRAOPERATIVE CHOLANGIOGRAM;  Surgeon: Ernestene Mention, MD;  Location: [facility];  Service: General;  Laterality: N/A;   LEFT HEART CATH AND CORONARY ANGIOGRAPHY N/A 04/20/2019   Procedure: LEFT HEART CATH AND CORONARY ANGIOGRAPHY;  Surgeon: Lyn Records, MD;  Location: [facility];  Service: Cardiovascular;  Laterality: N/A;    OB History   No obstetric history on file.      Home Medications     Prior to Admission medications   Medication Sig Start Date End Date Taking? Authorizing Provider  methylPREDNISolone (MEDROL) 16 MG tablet Take 1 tablet (16 mg total) by mouth daily. 06/02/23  Yes Particia Nearing, PA-C  atorvastatin (LIPITOR) 80 MG tablet TAKE 1 TABLET(80 MG) BY MOUTH DAILY 11/01/22   Pricilla Riffle, MD  ezetimibe (ZETIA) 10 MG tablet TAKE 1 TABLET(10 MG) BY MOUTH DAILY 03/11/23   Pricilla Riffle, MD  olmesartan-hydrochlorothiazide (BENICAR HCT) 20-12.5 MG tablet Take 1 tablet by mouth daily. 12/17/22   Willow Ora, MD  PHENobarbital (LUMINAL) 64.8 MG tablet Take 1.5 tablets (97.2 mg total) by mouth at bedtime. 05/03/23   Dohmeier, Porfirio Mylar, MD  XARELTO 20 MG TABS tablet TAKE 1 TABLET(20 MG) BY MOUTH DAILY WITH SUPPER 05/09/23   Pricilla Riffle, MD    Family History Family History  Problem Relation Age of Onset   Heart attack Father    Parkinson's disease Father    Heart disease Father    Heart disease Brother    Heart attack Brother    Mental illness Mother    [name] disease Mother    Depression Sister    Kidney disease Maternal Grandfather    Heart attack Paternal Grandmother     Social History Social History   Tobacco Use   Smoking status: Never   Smokeless tobacco: Never  Vaping Use   Vaping status: Never Used  Substance Use Topics   Alcohol  use: No   Drug use: No     Allergies   Tegretol [carbamazepine], Ace inhibitors, Keppra [levetiracetam], Ketorolac tromethamine, Sulfa antibiotics, and Lisinopril-hydrochlorothiazide   Review of Systems Review of Systems PER HPI  Physical Exam Triage Vital Signs ED Triage Vitals  Encounter Vitals Group     BP 06/02/23 1022 (!) 151/82     Systolic BP Percentile --      Diastolic BP Percentile --      Pulse Rate 06/02/23 1022 68     Resp 06/02/23 1022 16     Temp 06/02/23 1022 98.1 F (36.7 C)     Temp src --      SpO2 06/02/23 1022 96 %     Weight --      Height --      Head Circumference --       Peak Flow --      Pain Score 06/02/23 1025 4     Pain Loc --      Pain Education --      Exclude from Growth Chart --    No data found.  Updated Vital Signs BP 138/83 (BP Location: Right Arm)   Pulse 68   Temp 98.1 F (36.7 C)   Resp 16   SpO2 96%   Visual Acuity Right Eye Distance:   Left Eye Distance:   Bilateral Distance:    Right Eye Near:   Left Eye Near:    Bilateral Near:     Physical Exam Vitals and nursing note reviewed.  Constitutional:      Appearance: Normal appearance. She is not ill-appearing.  HENT:     Head: Atraumatic.     Left Ear: Tympanic membrane normal.     Ears:     Comments: Mild right middle ear effusion present, no erythema, purulence.  TM intact    Nose: Nose normal.     Mouth/Throat:     Mouth: Mucous membranes are moist.     Pharynx: Oropharynx is clear.  Eyes:     Extraocular Movements: Extraocular movements intact.     Conjunctiva/sclera: Conjunctivae normal.  Cardiovascular:     Rate and Rhythm: Normal rate and regular rhythm.     Heart sounds: Normal heart sounds.  Pulmonary:     Effort: Pulmonary effort is normal.     Breath sounds: Normal breath sounds.  Musculoskeletal:        General: Normal range of motion.     Cervical back: Normal range of motion and neck supple.  Skin:    General: Skin is warm and dry.  Neurological:     Mental Status: She is alert and oriented to person, place, and time.  Psychiatric:        Mood and Affect: Mood normal.        Thought Content: Thought content normal.        Judgment: Judgment normal.      UC Treatments / Results  Labs (all labs ordered are listed, but only abnormal results are displayed) Labs Reviewed - No data to display  EKG   Radiology No results found.  Procedures Procedures (including critical care time)  Medications Ordered in UC Medications - No data to display  Initial Impression / Assessment and Plan / UC Course  I have reviewed the triage vital signs  and the nursing notes.  Pertinent labs & imaging results that were available during my care of the patient were reviewed by me and considered in my medical  decision making (see chart for details).     Consistent with eustachian tube dysfunction, treat with steroid course, Flonase, supportive over-the-counter medications and home care.  Return for worsening symptoms.  No evidence of bacterial infection at this time.  Final Clinical Impressions(s) / UC Diagnoses   Final diagnoses:  Eustachian tube dysfunction, right     Discharge Instructions      Take Flonase twice daily, you may add an antihistamine daily in case seasonal allergies are playing a role in your current symptoms.  I have also sent in a short course of steroid to help with your current symptoms.  Follow-up if symptoms are significantly worsening.    ED Prescriptions     Medication Sig Dispense Auth. Provider   methylPREDNISolone (MEDROL) 16 MG tablet Take 1 tablet (16 mg total) by mouth daily. 3 tablet Particia Nearing, [place]      PDMP not reviewed this encounter.   Particia Nearing, [place] 06/02/23 1059

## 2023-06-06 ENCOUNTER — Encounter: Payer: Self-pay | Admitting: Neurology

## 2023-06-06 ENCOUNTER — Ambulatory Visit (INDEPENDENT_AMBULATORY_CARE_PROVIDER_SITE_OTHER): Payer: Self-pay | Admitting: Neurology

## 2023-06-06 VITALS — BP 125/78 | HR 84 | Ht 64.0 in | Wt 200.0 lb

## 2023-06-06 DIAGNOSIS — R269 Unspecified abnormalities of gait and mobility: Secondary | ICD-10-CM | POA: Insufficient documentation

## 2023-06-06 NOTE — Progress Notes (Addendum)
Provider:  Melvyn Novas, MD  Primary Care Physician:  Willow Ora, MD [address] [place] [address]     Referring Provider: Willow Ora, Md [address] [place],  [place] [address]          Chief Complaint according to patient   Patient presents with:     New problem , established Patient (Initial Visit)           HISTORY OF PRESENT ILLNESS:  Sarah Chung is a 62 y.o. female patient who is here for revisit 06/06/2023 for her concern of a recent fall and possible injury to her sciatic nerve, she fell on a tile floor and hit the pelvic bottom, the pubic bone.  In September she went to [employer] in [facility] and when she woke up there noted loss of control of the left ankle.   Chief concern according to patient :  " I have had several spells of unusual gait impairment, on the beach, at [place] and at the [employer], before the  [employer], (not a seizure related thing). Before my fall"     Assessment per Dr Mardelle Matte: 1. Leg heaviness   2. Acute left ankle pain   3. Right sided sciatica    Leg heaviness: Patient's neurologic exam was fluctuating.  Had trouble walking at her heels and toes but seem to have fairly good strength when I tested her.    Can get EMG/NCV testing if needed- but it is affordable to her, waiting time for EMG NCV is 6-8 weeks.Recent lab work was significant for a low vitamin D.  She is on supplements now. Ankle pain: Fortunately this has resolved.  Possibly gout or other. Right sciatica: if this persist, would recommend physical therapy.  Patient is hesitant because of cost.  She did also follow-up with sports medicine. She is scheduled for complete physical upcoming 06/22/2023 and may get a her labs then. [facility].    Review of Systems: Out of a complete 14 system review, the patient complains of only the following symptoms, and all other reviewed systems are negative.:    Focal pain at the os pupic and  sciatic after a fall, non- radiating.  She has to touch the area to feel pain.  Focal right " seat pain"  Left foot doesn't follow, seems clumsy, affecting gait . Lost weight.  Remains Anticoagulated. No bleeding evidence.   Was On statins, just taking a break now after Dr Mardelle Matte suggested 1 month off- .   Social History   Socioeconomic History   Marital status: Divorced    Spouse name: Sarah Chung   Number of children: 2   Years of education: College   Highest education level: Bachelor's degree (e.g., BA, AB, BS)  Occupational History   Occupation: Musician: [employer]  Tobacco Use   Smoking status: Never   Smokeless tobacco: Never  Vaping Use   Vaping status: Never Used  Substance and Sexual Activity   Alcohol use: No   Drug use: No   Sexual activity: Yes    Birth control/protection: Post-menopausal  Other Topics Concern   Not on file  Social History Narrative   Patient is married (Sarah Chung) and lives at home with his husband.   Patient has two adult children.   Patient is working full-time.   Patient has a college education.   Patient is right-handed.  Patient drinks two cups of coffee and some soda.   Social Determinants of Health   Financial Resource Strain: Not on file  Food Insecurity: No Food Insecurity (12/17/2022)   Hunger Vital Sign    Worried About Running Out of Food in the Last Year: Never true    Ran Out of Food in the Last Year: Never true  Transportation Needs: No Transportation Needs (12/17/2022)   PRAPARE - Administrator, Civil Service (Medical): No    Lack of Transportation (Non-Medical): No  Physical Activity: Sufficiently Active (12/17/2022)   Exercise Vital Sign    Days of Exercise per Week: 7 days    Minutes of Exercise per Session: 70 min  Stress: No Stress Concern Present (12/17/2022)   [employer] of Occupational Health - Occupational Stress Questionnaire    Feeling of Stress : Not at all  Social Connections:  Unknown (12/17/2022)   Social Connection and Isolation Panel [NHANES]    Frequency of Communication with Friends and Family: More than three times a week    Frequency of Social Gatherings with Friends and Family: Three times a week    Attends Religious Services: More than 4 times per year    Active Member of Clubs or Organizations: Not on file    Attends Banker Meetings: Not on file    Marital Status: Divorced    Family History  Problem Relation Age of Onset   Heart attack Father    Parkinson's disease Father    Heart disease Father    Heart disease Brother    Heart attack Brother    Mental illness Mother    [name] disease Mother    Depression Sister    Kidney disease Maternal Grandfather    Heart attack Paternal Grandmother     Past Medical History:  Diagnosis Date   Allergy    Calculus of gallbladder with acute cholecystitis, without mention of obstruction 07/03/2013   Clotting disorder (HCC)    DVT (deep vein thrombosis) in pregnancy    Gallstones    Hyperlipidemia    Pulmonary embolism (HCC)    Seizures (HCC)     Past Surgical History:  Procedure Laterality Date   CHOLECYSTECTOMY N/A 07/02/2013   Procedure: LAPAROSCOPIC CHOLECYSTECTOMY WITH INTRAOPERATIVE CHOLANGIOGRAM;  Surgeon: Ernestene Mention, MD;  Location: [facility];  Service: General;  Laterality: N/A;   LEFT HEART CATH AND CORONARY ANGIOGRAPHY N/A 04/20/2019   Procedure: LEFT HEART CATH AND CORONARY ANGIOGRAPHY;  Surgeon: Lyn Records, MD;  Location: [facility];  Service: Cardiovascular;  Laterality: N/A;     Current Outpatient Medications on File Prior to Visit  Medication Sig Dispense Refill   atorvastatin (LIPITOR) 80 MG tablet TAKE 1 TABLET(80 MG) BY MOUTH DAILY 90 tablet 3   ezetimibe (ZETIA) 10 MG tablet TAKE 1 TABLET(10 MG) BY MOUTH DAILY 90 tablet 3   olmesartan-hydrochlorothiazide (BENICAR HCT) 20-12.5 MG tablet Take 1 tablet by mouth daily. 90 tablet 3   PHENobarbital  (LUMINAL) 64.8 MG tablet Take 1.5 tablets (97.2 mg total) by mouth at bedtime. 135 tablet 1   XARELTO 20 MG TABS tablet TAKE 1 TABLET(20 MG) BY MOUTH DAILY WITH SUPPER 30 tablet 3   methylPREDNISolone (MEDROL) 4 MG tablet Take 16 mg by mouth daily. (Patient not taking: Reported on 06/06/2023)     No current facility-administered medications on file prior to visit.    Allergies  Allergen Reactions   Tegretol [Carbamazepine] Hives   Ace Inhibitors  Cough   Keppra [Levetiracetam]     Feels outside her body.[name]   Ketorolac Tromethamine    Sulfa Antibiotics Hives   Lisinopril-Hydrochlorothiazide Cough     DIAGNOSTIC DATA (LABS, IMAGING, TESTING) - I reviewed patient records, labs, notes, testing and imaging myself where available.  Lab Results  Component Value Date   WBC 6.3 03/25/2023   HGB 13.3 03/25/2023   HCT 39.9 03/25/2023   MCV 96.6 03/25/2023   PLT 244 03/25/2023      Component Value Date/Time   NA 139 12/17/2022 1023   NA 143 08/27/2021 0928   K 5.1 12/17/2022 1023   CL 101 12/17/2022 1023   CO2 28 12/17/2022 1023   GLUCOSE 92 12/17/2022 1023   BUN 32 (H) 12/17/2022 1023   BUN 14 08/27/2021 0928   CREATININE 1.14 12/17/2022 1023   CREATININE 0.96 06/24/2020 1353   CALCIUM 9.5 12/17/2022 1023   PROT 8.6 (H) 03/11/2023 1059   PROT 8.3 08/27/2021 0928   ALBUMIN 4.1 03/11/2023 1059   ALBUMIN 4.5 08/27/2021 0928   AST 30 03/11/2023 1059   ALT 33 03/11/2023 1059   ALKPHOS 119 03/11/2023 1059   BILITOT 0.6 03/11/2023 1059   BILITOT 0.4 08/27/2021 0928   GFRNONAA >60 08/16/2019 1855   GFRAA >60 08/16/2019 1855   Lab Results  Component Value Date   CHOL 163 06/23/2022   HDL 67 03/11/2023   LDLCALC 83 06/23/2022   TRIG 71 03/11/2023   CHOLHDL 3 06/23/2022   Lab Results  Component Value Date   HGBA1C 5.8 12/17/2022   No results found for: "VITAMINB12" Lab Results  Component Value Date   TSH 0.54 06/23/2022    PHYSICAL EXAM:  Today's Vitals    06/06/23 1316  BP: 125/78  Pulse: 84  Weight: 200 lb (90.7 kg)  Height: 5\' 4"  (1.626 m)   Body mass index is 34.33 kg/m.   Wt Readings from Last 3 Encounters:  06/06/23 185 lb (84.7 kg)  05/16/23 184 lb 9.6 oz (83.7 kg)  05/02/23 186 lb (84.4 kg)     Who ran this patient ? She would like her weight corrected to 185 pounds,   Ht Readings from Last 3 Encounters:  06/06/23 5\' 4"  (1.626 m)  05/16/23 5' 4.5" (1.638 m)  05/02/23 5' 4.5" (1.638 m)      General: The patient is awake, alert and appears not in acute distress. The patient is well groomed. Head: Normocephalic, atraumatic. Neck is supple. Mallampati 2 , neck circumference: 14 inches. Developed a small hump.  Cardiovascular:  Regular rate and rhythm , without  murmurs or carotid bruit, and without distended neck veins. Respiratory: Lungs are clear to auscultation. Skin:  Without evidence of edema, or rash Trunk: BMI is elevated, 34,normal posture.   Neurologic exam : The patient is awake and alert, oriented to place and time.   Memory subjective described as intact. There is a normal attention span & concentration ability.  Speech is pressured  without  dysarthria, dysphonia or aphasia.  Mood and affect are much proved. She is no longer tearful, appeared less depressed.   Cranial nerves: Pupils are equal and briskly reactive to light.  Extraocular movements  in vertical and horizontal planes intact.  Visual fields by finger perimetry are intact.  Facial sensation intact to fine touch. Motor exam:   Normal tone, muscle bulk and symmetric strength in all extremities. Sensory:  Fine touch, pinprick and vibration were normal-  Coordination: Rapid alternating movements /Finger-to-nose  maneuver tested and normal without evidence of ataxia, dysmetria or tremor. Gait and station: Patient walks without assistive device. Strength within normal limits.  Deep tendon reflexes: in the  upper and lower extremities are symmetric and  intact.         ASSESSMENT AND PLAN 34 -year- old female  primary seizure patient here with a new problem:    1) fall related injury - a "twist" to the pelvic bone.  Focal, non-radiating pain.  Has to touch the space to hurt.   2) I am not sure what happened to her left foot. Painful to put weight on. Not a fasciitis.  No bone injury/ fracture- Doubt gout. X rays were negative. Good news:  no fasciculations.   3) Tingling on the left back- near the flank-  , thoracic T 4-10, only on the left back, no skin lesion was seen. Recommending Stretching exercises.  4) muscle cramping- in lower extremities , also in the lower abdomen/ stomach, inner thigh.  Plan:  Keep hydrating , take electrolytes. Consider tonic water - one glass at night , no more than 12 ounces.   5) She reports magnetic legs, feeling heavy on the  left. There is ataxia when not looking at her feet while walking.  Unable to do heel walk and barely able to do a toe walk. Fluctuating performance.  Long term effects of some anti epileptic drugs can cause this but it shouldn't be on one side.    No incontinence no STM loss, no stooped posture. Not PD. Not NPH.   I like for her to have a PT evaluation and instruction for gait exercises/ ROM exercises at home. I recommended to call Dr. Denyse Amass . I will cc him.   I would like to obtain a CT of the brain, neck spine , but there are financial restrains.  I want to make sure there is no cervico-cranial junction injury. She will call us if she wants them done.   Melvyn Novas, MD       I plan to follow up either personally or through our NP prn for this problem.   I would like to thank Willow Ora, MD    After spending a total time of  40  minutes face to face and additional time for physical and neurologic examination, review of laboratory studies,  personal review of imaging studies, reports and results of other testing and review of referral information / records as  far as provided in visit,   Electronically signed by: Melvyn Novas, MD 06/06/2023 2:08 PM   ADDENDUM< the patient's weight was entered incorrectly into this chart and is 185 pounds .   [facility] and [employer] certified by [employer] of Sleep Medicine and Diplomate of the [employer] of Sleep Medicine. Board certified In Neurology through the ABPN, Fellow of the [employer] of Neurology.

## 2023-06-14 ENCOUNTER — Encounter: Payer: Self-pay | Admitting: Family Medicine

## 2023-06-16 ENCOUNTER — Telehealth: Payer: Self-pay | Admitting: Neurology

## 2023-06-16 NOTE — Telephone Encounter (Signed)
Addendum to last visit note, correction.

## 2023-06-16 NOTE — Telephone Encounter (Signed)
Received a form for neuro clearance to proceed with colonoscopy. I have completed the form and provided it to Dr Dohmeier to sign. Will fax to [facility] once signed

## 2023-06-21 LAB — HM COLONOSCOPY

## 2023-06-22 ENCOUNTER — Encounter: Payer: Self-pay | Admitting: Family Medicine

## 2023-06-22 ENCOUNTER — Ambulatory Visit (INDEPENDENT_AMBULATORY_CARE_PROVIDER_SITE_OTHER): Payer: Self-pay | Admitting: Family Medicine

## 2023-06-22 VITALS — BP 138/82 | HR 60 | Temp 98.0°F | Ht 64.0 in | Wt 185.0 lb

## 2023-06-22 DIAGNOSIS — Z7901 Long term (current) use of anticoagulants: Secondary | ICD-10-CM

## 2023-06-22 DIAGNOSIS — T464X5A Adverse effect of angiotensin-converting-enzyme inhibitors, initial encounter: Secondary | ICD-10-CM | POA: Insufficient documentation

## 2023-06-22 DIAGNOSIS — R269 Unspecified abnormalities of gait and mobility: Secondary | ICD-10-CM

## 2023-06-22 DIAGNOSIS — R7301 Impaired fasting glucose: Secondary | ICD-10-CM

## 2023-06-22 DIAGNOSIS — K635 Polyp of colon: Secondary | ICD-10-CM

## 2023-06-22 DIAGNOSIS — R058 Other specified cough: Secondary | ICD-10-CM

## 2023-06-22 DIAGNOSIS — Z0001 Encounter for general adult medical examination with abnormal findings: Secondary | ICD-10-CM

## 2023-06-22 DIAGNOSIS — G40909 Epilepsy, unspecified, not intractable, without status epilepticus: Secondary | ICD-10-CM

## 2023-06-22 DIAGNOSIS — I10 Essential (primary) hypertension: Secondary | ICD-10-CM

## 2023-06-22 DIAGNOSIS — E782 Mixed hyperlipidemia: Secondary | ICD-10-CM

## 2023-06-22 DIAGNOSIS — Z0000 Encounter for general adult medical examination without abnormal findings: Secondary | ICD-10-CM

## 2023-06-22 DIAGNOSIS — F321 Major depressive disorder, single episode, moderate: Secondary | ICD-10-CM

## 2023-06-22 HISTORY — DX: Other specified cough: R05.8

## 2023-06-22 MED ORDER — OLMESARTAN MEDOXOMIL-HCTZ 20-12.5 MG PO TABS: 2.0000 | ORAL_TABLET | Freq: Every day | ORAL | Status: DC

## 2023-06-22 NOTE — Patient Instructions (Signed)
Please return in 6 months for hypertension follow up.   I will release your lab results to you on your MyChart account with further instructions. You may see the results before I do, but when I review them I will send you a message with my report or have my assistant call you if things need to be discussed. Please reply to my message with any questions. Thank you!   If you have any questions or concerns, please don't hesitate to send me a message via MyChart or call the office at [phone]. Thank you for visiting with Sarah Chung today! It's our pleasure caring for you.   VISIT SUMMARY:  During your visit today, we discussed your ongoing gait issues, hypertension management, and general health maintenance. We reviewed your symptoms and concerns, including your recent colonoscopy results and muscle spasms. We also talked about the potential causes of your gait disturbance and the next steps for diagnosis and treatment.  YOUR PLAN:  -GAIT DISTURBANCE: Your difficulty walking and fatigue may be due to a spinal fluid leak. This condition can cause symptoms like muscle spasms and difficulty lifting your feet. We will order a CT scan to check for a spinal fluid leak. If confirmed, it can be treated with a needle procedure that has a high success rate. We will also discuss the potential benefits of physical therapy with your neurologist.  -HYPERTENSION: Hypertension, or high blood pressure, can lead to serious health problems if not managed well. Your recent readings have improved but are not consistently within the target range. We will increase your current medication, olmesartan HCTZ, to 40/25 mg by taking two pills together. Please monitor your blood pressure over the next few weeks and watch for symptoms like lightheadedness. We will reassess your blood pressure control in six months.  -HYPERLIPIDEMIA: Hyperlipidemia means having high cholesterol levels, which can increase the risk of heart disease. You are  currently taking atorvastatin to manage this condition. Please continue taking this medication as prescribed.  -GENERAL HEALTH MAINTENANCE: For your overall health, we will check your thyroid function, A1c, liver, and kidney function. We will also follow up on the pathology results from your recent colonoscopy, where three small polyps were removed. Continue monitoring your blood pressure and cholesterol levels as discussed.  INSTRUCTIONS:  - Follow up with your neurologist regarding the CT scan results and potential physical therapy. - Follow up in six months for blood pressure reassessment. - Send a message to the doctor if you experience any issues with the increased blood pressure medication.

## 2023-06-22 NOTE — Progress Notes (Signed)
Subjective  Chief Complaint  Patient presents with   Annual Exam    Pt here for Annual Exam and is currently not fasting    Hypertension   Depression    HPI: Sarah Chung is a 63 y.o. female who presents to [facility] [place] today for a Female Wellness Visit. She also has the concerns and/or needs as listed above in the chief complaint. These will be addressed in addition to the Health Maintenance Visit.   Wellness Visit: annual visit with health maintenance review and exam  Health maintenance: Had colonoscopy yesterday.  3 polyps removed.  Needs mammogram and patient will schedule.  Pap smear up-to-date. Reviewed neurology notes.   Chronic disease f/u and/or acute problem visit: (deemed necessary to be done in addition to the wellness visit): Discussed the use of AI scribe software for clinical note transcription with the patient, who gave verbal consent to proceed.  History of Present Illness   The patient, with a history of hypertension and a gait abnormality, presents for a routine physical. She reports a persistent issue with her gait, describing it as "off" and feeling as if she has "magnetic legs," making it difficult to lift her feet. This issue has been causing fatigue, as walking now feels like a chore. The patient notes that her gait abnormality came on suddenly while [place] the beach and has not improved since.  A neurologist has suggested that the patient may have a spinal fluid leak, which could be causing the gait issues. She expresses concern about the potential treatment, which involves draining the fluid via a needle, but is eager to resolve the issue, especially as she plans to travel to [place] next year.  The patient also reports occasional muscle spasms in various parts of her body, including her stomach. These spasms are intense and seem to occur without a clear pattern. She also mentions a persistent pain in her back, which she believes may be  related to her gait issues.  In addition to these concerns, the patient has been managing hypertension with medication. She has been monitoring her blood pressure at home, with readings mostly in the 120s over 70s or 80s. However, some readings have been higher, in the 130s over 80s.  Lastly, the patient mentions a recent colonoscopy, during which three small polyps were found and removed. The polyps are being sent for testing, but the patient was reassured that they are likely not a cause for concern.      Mood is much better now. Feels less anxious.   No bleeding on chronic anticoagulation  No sxs of hyperglycemia. Weight continues to trend downwards. Exercising and eating well.   Assessment  1. Encounter for well adult exam with abnormal findings   2. Seizure disorder (HCC)   3. Essential hypertension   4. Mixed hyperlipidemia   5. Depression, major, single episode, moderate (HCC)   6. Chronic anticoagulation   7. Gait difficulty   8. IFG (impaired fasting glucose)   9. Polyp of colon, unspecified part of colon, unspecified type   10. ACE-inhibitor cough      Plan  Female Wellness Visit: Age appropriate Health Maintenance and Prevention measures were discussed with patient. Included topics are cancer screening recommendations, ways to keep healthy (see AVS) including dietary and exercise recommendations, regular eye and dental care, use of seat belts, and avoidance of moderate alcohol use and tobacco use.  BMI: discussed patient's BMI and encouraged positive lifestyle modifications  to help get to or maintain a target BMI. HM needs and immunizations were addressed and ordered. See below for orders. See HM and immunization section for updates. Routine labs and screening tests ordered including cmp, cbc and lipids where appropriate. Discussed recommendations regarding Vit D and calcium supplementation (see AVS)  Chronic disease management visit and/or acute problem  visit: Assessment and Plan    Gait Disturbance Chronic gait disturbance with significant fatigue and difficulty walking, potentially due to a spinal fluid leak. Symptoms include inability to walk on heels, left leg giving out, and muscle spasms. Differential diagnosis includes spinal fluid leak, ALS, and MS, but ALS and MS have been ruled out by the neurologist. Discussed CT scan for diagnosis, which is less expensive than MRI. If a spinal fluid leak is confirmed, it can be treated with a needle procedure with a 98% success rate. - Order CT scan to evaluate for spinal fluid leak - Discuss potential benefits and limitations of physical therapy with the neurologist - Consider referral to physical therapy if recommended by the neurologist  Hypertension Hypertension with recent readings showing improvement but not consistently within the target range. Current medication is olmesartan HCTZ 20/12.5 mg. Goal is to achieve consistent readings in the 110-120/60-70 range to prevent long-term complications. Discussed increasing the dose by taking two pills together and monitoring for symptoms of hypotension such as lightheadedness. - Increase olmesartan HCTZ to 40/25 mg by taking two pills together - Monitor blood pressure over the next several weeks - Adjust medication if blood pressure becomes too low or if there are symptoms of lightheadedness - Follow up in six months to reassess blood pressure control  Hyperlipidemia Hyperlipidemia with previous high cholesterol levels. Advised to be on Repatha but has financial constraints. Currently on atorvastatin. - Continue atorvastatin as previously prescribed Cards recommends repatha but pt needs financial assistance  IFG: recheck A1c, continue weight loss  Colon polyps: awaiting path. Will need repeat in 3-5 years depending on results.   General Health Maintenance Routine health maintenance including recent colonoscopy and blood work. Three small polyps  removed during colonoscopy, sent for pathology. Monitoring blood pressure and cholesterol levels. - Check thyroid function, A1c, liver, and kidney function - Follow up on colonoscopy pathology results - Encourage continued monitoring of blood pressure and cholesterol levels  Follow-up - Follow up with neurologist regarding CT scan results and potential physical therapy - Follow up in six months for blood pressure reassessment - Send a message to the doctor if there are any issues with the increased blood pressure medication.      Orders Placed This Encounter  Procedures   CBC with Differential/Platelet   Comprehensive metabolic panel   TSH   Hemoglobin A1c   Meds ordered this encounter  Medications   olmesartan-hydrochlorothiazide (BENICAR HCT) 20-12.5 MG tablet    Sig: Take 2 tablets by mouth daily.      Body mass index is 31.76 kg/m. Wt Readings from Last 3 Encounters:  06/22/23 185 lb (83.9 kg)  06/06/23 200 lb (90.7 kg)  05/16/23 184 lb 9.6 oz (83.7 kg)     Patient Active Problem List   Diagnosis Date Noted Date Diagnosed   Essential hypertension 09/20/2022     Priority: High    Ace cough with lisinopril; changed to olmesartan/hct    Depression, major, single episode, moderate (HCC) 09/01/2022     Priority: High   Chronic anticoagulation 06/22/2021     Priority: High    H/o PE x 2  Seizure disorder (HCC) 08/27/2020     Priority: High   Mixed hyperlipidemia 06/20/2020     Priority: High   Obesity 06/20/2020     Priority: High   Nonobstructive atherosclerosis of coronary artery 04/20/2019     Priority: High   Pulmonary embolism (HCC) x2 02/09/2019     Priority: High   Colon polyps 06/22/2023     Priority: Medium     Colonoscopy 06/2023, Dr. Marca Ancona    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     Priority: Medium    Osteopenia 12/17/2022     Priority: Medium     Dexa Dr. Rana Snare 11/2022, lowest T-1.7 left femoral neck; osteopenia    Gait difficulty 06/06/2023     Health Maintenance  Topic Date Due   COVID-19 Vaccine (4 - 2023-24 season) 07/08/2023 (Originally 04/10/2023)   MAMMOGRAM  11/22/2023   Cervical Cancer Screening (HPV/Pap Cotest)  11/03/2024   DEXA SCAN  11/17/2024   DTaP/Tdap/Td (2 - Td or Tdap) 04/15/2025   Colonoscopy  06/20/2033   HIV Screening  Completed   HPV VACCINES  Aged Out   INFLUENZA VACCINE  Discontinued   Hepatitis C Screening  Discontinued   Zoster Vaccines- Shingrix  Discontinued   Immunization History  Administered Date(s) Administered   Influenza,inj,Quad PF,6+ Mos 06/20/2020, 07/22/2021   PFIZER(Purple Top)SARS-COV-2 Vaccination 08/30/2019, 09/20/2019, 07/05/2020   Tdap 04/16/2015   We updated and reviewed the patient's past history in detail and it is documented below. Allergies: Patient is allergic to tegretol [carbamazepine], ace inhibitors, keppra [levetiracetam], ketorolac tromethamine, sulfa antibiotics, and lisinopril-hydrochlorothiazide. Past Medical History Patient  has a past medical history of ACE-inhibitor cough (06/22/2023), Allergy, Calculus of gallbladder with acute cholecystitis, without mention of obstruction (07/03/2013), Clotting disorder (HCC), DVT (deep vein thrombosis) in pregnancy, Gallstones, Hyperlipidemia, Pulmonary embolism (HCC), and Seizures (HCC). Past Surgical History Patient  has a past surgical history that includes Cholecystectomy (N/A, 07/02/2013) and LEFT HEART CATH AND CORONARY ANGIOGRAPHY (N/A, 04/20/2019). Family History: Patient family history includes Depression in her sister; [name] disease in her mother; Heart attack in her brother, father, and paternal grandmother; Heart disease in her brother and father; Kidney disease in her maternal grandfather; Mental illness in her mother; Parkinson's disease in her father. Social History:  Patient  reports that she has never smoked. She has never used smokeless tobacco. She reports that she does not drink alcohol and does not use  drugs.  Review of Systems: Constitutional: negative for fever or malaise Ophthalmic: negative for photophobia, double vision or loss of vision Cardiovascular: negative for chest pain, dyspnea on exertion, or new LE swelling Respiratory: negative for SOB or persistent cough Gastrointestinal: negative for abdominal pain, change in bowel habits or melena Genitourinary: negative for dysuria or gross hematuria, no abnormal uterine bleeding or disharge Musculoskeletal: negative for new gait disturbance or muscular weakness Integumentary: negative for new or persistent rashes, no breast lumps Neurological: negative for TIA or stroke symptoms Psychiatric: negative for SI or delusions Allergic/Immunologic: negative for hives  Patient Care Team    Relationship Specialty Notifications Start End  Willow Ora, MD PCP - General Family Medicine  03/16/21   Pricilla Riffle, MD PCP - Cardiology Cardiology  03/11/23   Candice Camp, MD Consulting Physician Obstetrics and Gynecology  06/20/20   Dohmeier, Porfirio Mylar, MD Consulting Physician Neurology  06/20/20   Doreatha Massed, MD Consulting Physician Hematology  06/20/20   Ander Purpura, OD  Optometry  06/20/20   Kerin Salen, MD Consulting Physician Gastroenterology  06/22/23  Objective  Vitals: BP 138/82   Pulse 60   Temp 98 F (36.7 C)   Ht 5\' 4"  (1.626 m)   Wt 185 lb (83.9 kg)   SpO2 97%   BMI 31.76 kg/m  General:  Well developed, well nourished, no acute distress  Psych:  Alert and orientedx3,normal mood and affect HEENT:  Normocephalic, atraumatic, non-icteric sclera,  supple neck without adenopathy, mass or thyromegaly Cardiovascular:  Normal S1, S2, RRR without gallop, rub or murmur Respiratory:  Good breath sounds bilaterally, CTAB with normal respiratory effort Gastrointestinal: normal bowel sounds, soft, non-tender, no noted masses. No HSM MSK: extremities without edema, joints without erythema or swelling Neurologic:    Mental  status is normal.    Commons side effects, risks, benefits, and alternatives for medications and treatment plan prescribed today were discussed, and the patient expressed understanding of the given instructions. Patient is instructed to call or message via MyChart if he/she has any questions or concerns regarding our treatment plan. No barriers to understanding were identified. We discussed Red Flag symptoms and signs in detail. Patient expressed understanding regarding what to do in case of urgent or emergency type symptoms.  Medication list was reconciled, printed and provided to the patient in AVS. Patient instructions and summary information was reviewed with the patient as documented in the AVS. This note was prepared with assistance of Dragon voice recognition software. Occasional wrong-word or sound-a-like substitutions may have occurred due to the inherent limitations of voice recognition software

## 2023-06-23 NOTE — Addendum Note (Signed)
Addended by: Lorn Junes on: 06/23/2023 09:45 AM   Modules accepted: Orders

## 2023-06-24 ENCOUNTER — Other Ambulatory Visit: Payer: Self-pay | Admitting: Neurology

## 2023-06-24 ENCOUNTER — Other Ambulatory Visit (INDEPENDENT_AMBULATORY_CARE_PROVIDER_SITE_OTHER): Payer: Self-pay

## 2023-06-24 ENCOUNTER — Telehealth: Payer: Self-pay | Admitting: Internal Medicine

## 2023-06-24 DIAGNOSIS — G40909 Epilepsy, unspecified, not intractable, without status epilepticus: Secondary | ICD-10-CM

## 2023-06-24 DIAGNOSIS — R269 Unspecified abnormalities of gait and mobility: Secondary | ICD-10-CM

## 2023-06-24 DIAGNOSIS — Z0001 Encounter for general adult medical examination with abnormal findings: Secondary | ICD-10-CM

## 2023-06-24 DIAGNOSIS — E782 Mixed hyperlipidemia: Secondary | ICD-10-CM

## 2023-06-24 DIAGNOSIS — R7301 Impaired fasting glucose: Secondary | ICD-10-CM

## 2023-06-24 DIAGNOSIS — I10 Essential (primary) hypertension: Secondary | ICD-10-CM

## 2023-06-24 LAB — CBC WITH DIFFERENTIAL/PLATELET
Basophils Absolute: 0 10*3/uL (ref 0.0–0.1)
Basophils Relative: 0.4 % (ref 0.0–3.0)
Eosinophils Absolute: 0.1 10*3/uL (ref 0.0–0.7)
Eosinophils Relative: 1.1 % (ref 0.0–5.0)
HCT: 39.6 % (ref 36.0–46.0)
Hemoglobin: 13.4 g/dL (ref 12.0–15.0)
Lymphocytes Relative: 24.1 % (ref 12.0–46.0)
Lymphs Abs: 2.1 10*3/uL (ref 0.7–4.0)
MCHC: 33.7 g/dL (ref 30.0–36.0)
MCV: 95.4 fL (ref 78.0–100.0)
Monocytes Absolute: 0.6 10*3/uL (ref 0.1–1.0)
Monocytes Relative: 6.8 % (ref 3.0–12.0)
Neutro Abs: 5.8 10*3/uL (ref 1.4–7.7)
Neutrophils Relative %: 67.6 % (ref 43.0–77.0)
Platelets: 268 10*3/uL (ref 150.0–400.0)
RBC: 4.15 Mil/uL (ref 3.87–5.11)
RDW: 13.4 % (ref 11.5–15.5)
WBC: 8.5 10*3/uL (ref 4.0–10.5)

## 2023-06-24 LAB — TSH: TSH: 0.59 u[IU]/mL (ref 0.35–5.50)

## 2023-06-24 LAB — COMPREHENSIVE METABOLIC PANEL
ALT: 28 U/L (ref 0–35)
AST: 23 U/L (ref 0–37)
Albumin: 4.5 g/dL (ref 3.5–5.2)
Alkaline Phosphatase: 127 U/L — ABNORMAL HIGH (ref 39–117)
BUN: 23 mg/dL (ref 6–23)
CO2: 29 meq/L (ref 19–32)
Calcium: 9.8 mg/dL (ref 8.4–10.5)
Chloride: 99 meq/L (ref 96–112)
Creatinine, Ser: 1.09 mg/dL (ref 0.40–1.20)
GFR: 53.89 mL/min — ABNORMAL LOW (ref >60.00)
Glucose, Bld: 74 mg/dL (ref 70–99)
Potassium: 5.5 meq/L — ABNORMAL HIGH (ref 3.5–5.1)
Sodium: 138 meq/L (ref 135–145)
Total Bilirubin: 0.5 mg/dL (ref 0.2–1.2)
Total Protein: 8.3 g/dL (ref 6.0–8.3)

## 2023-06-24 LAB — HEMOGLOBIN A1C: Hgb A1c MFr Bld: 5.7 % (ref 4.6–6.5)

## 2023-06-24 NOTE — Telephone Encounter (Signed)
Patient has dropped off assistance forms, they are in your folder.

## 2023-06-24 NOTE — Telephone Encounter (Signed)
Pt Sarah Chung and [employer] patient assistance application was placed in Dr. Tenny Craw mailbox for a signature, before scanning to [facility] [place], [facility] email. FYI

## 2023-06-27 ENCOUNTER — Telehealth: Payer: Self-pay | Admitting: Neurology

## 2023-06-27 NOTE — Telephone Encounter (Signed)
Patient is self-pay, sent to [facility]. [phone]

## 2023-06-30 ENCOUNTER — Other Ambulatory Visit: Payer: Self-pay

## 2023-06-30 DIAGNOSIS — E875 Hyperkalemia: Secondary | ICD-10-CM

## 2023-06-30 NOTE — Progress Notes (Signed)
Please call patient and notify her that her lab work looks mostly normal.  However potassium is showing mildly elevated.  I would like her to return early next week for repeat BMP.  Please order BMP for hyperkalemia

## 2023-07-05 ENCOUNTER — Other Ambulatory Visit (INDEPENDENT_AMBULATORY_CARE_PROVIDER_SITE_OTHER): Payer: Self-pay

## 2023-07-05 DIAGNOSIS — E875 Hyperkalemia: Secondary | ICD-10-CM

## 2023-07-05 LAB — BASIC METABOLIC PANEL
BUN: 27 mg/dL — ABNORMAL HIGH (ref 6–23)
CO2: 30 meq/L (ref 19–32)
Calcium: 9.5 mg/dL (ref 8.4–10.5)
Chloride: 100 meq/L (ref 96–112)
Creatinine, Ser: 0.89 mg/dL (ref 0.40–1.20)
GFR: 68.71 mL/min (ref >60.00)
Glucose, Bld: 81 mg/dL (ref 70–99)
Potassium: 4 meq/L (ref 3.5–5.1)
Sodium: 138 meq/L (ref 135–145)

## 2023-07-05 NOTE — Telephone Encounter (Signed)
Forms signed and scanned to [facility] email.

## 2023-07-06 ENCOUNTER — Telehealth: Payer: Self-pay

## 2023-07-06 ENCOUNTER — Other Ambulatory Visit (HOSPITAL_COMMUNITY): Payer: Self-pay

## 2023-07-06 ENCOUNTER — Institutional Professional Consult (permissible substitution): Payer: Self-pay | Admitting: Neurology

## 2023-07-06 ENCOUNTER — Telehealth: Payer: Self-pay | Admitting: Internal Medicine

## 2023-07-06 NOTE — Telephone Encounter (Signed)
Follow Up:    Patient wants to know if the form that she left for her Xarelto is ready?

## 2023-07-06 NOTE — Telephone Encounter (Addendum)
There's an encounter from June that patient is approved till July of 2025. Patient should have access to the medication.

## 2023-07-06 NOTE — Telephone Encounter (Signed)
Application was completed and sent to company. Patient was made aware via mychart.

## 2023-07-06 NOTE — Telephone Encounter (Addendum)
*  Clarified with [employer] that they will accept both the red and blue forms till 2025.  Completing provider portion.

## 2023-07-06 NOTE — Telephone Encounter (Signed)
PAP: Application for XARELTO has been submitted to PAP Companies: [employer] ([employer]), via fax

## 2023-07-11 ENCOUNTER — Ambulatory Visit
Admission: RE | Admit: 2023-07-11 | Discharge: 2023-07-11 | Disposition: A | Discharge status: Home / Self Care | Payer: Self-pay | Source: Ambulatory Visit | Attending: Neurology | Admitting: Neurology

## 2023-07-11 DIAGNOSIS — R269 Unspecified abnormalities of gait and mobility: Secondary | ICD-10-CM

## 2023-07-11 DIAGNOSIS — G40909 Epilepsy, unspecified, not intractable, without status epilepticus: Secondary | ICD-10-CM

## 2023-07-11 NOTE — Telephone Encounter (Signed)
PAP: Patient has been denied for pt assistance by PAP Companies: [employer] due to patient above income threshold for program.  Full denial indexed to chart media. Credit check pulled by [employer] shows 2 household members but patient is divorced and is HH Size of 1.   Requested she bring her tax form and they should reconsider. Patient is aware of what she needs to bring and will drop off documents at the office once she has obtained copies.

## 2023-07-12 ENCOUNTER — Telehealth: Payer: Self-pay

## 2023-07-12 NOTE — Telephone Encounter (Signed)
-----   Message from [place] Breeze Dohmeier sent at 07/11/2023  5:39 PM EST ----- Normal non-contrast CT of the head without evidence of abnormal fluid distribution, edema, vascularization ,etc.

## 2023-07-12 NOTE — Telephone Encounter (Signed)
Received tax document from patient. Document has been faxed to company.

## 2023-07-12 NOTE — Telephone Encounter (Signed)
I spoke with the patient and provided the results of the CT of head. She verbalized understanding of the findings and expressed appreciation for the call.   She is very anxious about the results of the CT of cervical spine. She inquired if the CT of spine was marked as urgent. I informed her that I did not see anything to indicate that the results were marked as urgent, however that does not indicate that it was a normal study. I informed her that I did not have any results of the CT cervical spine to relay to her right now. She asked if she should be concerned that both results were not read at the same time. I told her that it was inconsequential. I again, informed her that there was no information I could provide regarding the CT of cervical spine at this time. She stated she will be waiting for a return call.

## 2023-07-14 NOTE — Telephone Encounter (Signed)
PAP: Patient assistance application for XARELTO has been approved by PAP Companies: [employer] ([employer]) from 07/13/23 to 07/12/24. Medication should be delivered to PAP Delivery: Home For further shipping updates, please contact [employer] at [phone]

## 2023-07-20 NOTE — Telephone Encounter (Signed)
Joni Reining from pharmacy is requesting a callback regarding them needing clarity on the dosage and instructions for the medication Xarelto the application was sent in for. She'd like a call at [phone]. Please advise

## 2023-07-20 NOTE — Telephone Encounter (Signed)
Spoke with Laquasha from Xarelto and me and she states the patient has been enrolled. She will need to transfer me to give clarifications with dose.  Spoke with Joni Reining the pharmacist ans she states the paperwork for Xarelto says 1 tablet BID.   Did clarify per med list that xarelto is 1 tablet 20 mg with supper.Marland Kitchen

## 2023-07-20 NOTE — Telephone Encounter (Signed)
Hi Sarah Chung, no, it should have been once daily. That was an error sent on the form on my end and was not intentional. The quantity was sent correctly with 90, but with the wrong sig. In the future this can be avoided by having the clinic assisting in completing the provider portion rx of the application. Do they need a new form sent with correct instructions or did they accept you clarifying the dose with them over the phone?

## 2023-07-20 NOTE — Telephone Encounter (Signed)
Sounds good, I appreciate you reaching out!

## 2023-07-24 ENCOUNTER — Other Ambulatory Visit: Payer: Self-pay | Admitting: Internal Medicine

## 2023-07-25 ENCOUNTER — Encounter: Payer: Self-pay | Admitting: Neurology

## 2023-07-26 ENCOUNTER — Encounter: Payer: Self-pay | Admitting: Internal Medicine

## 2023-07-26 ENCOUNTER — Institutional Professional Consult (permissible substitution): Payer: Self-pay | Admitting: Neurology

## 2023-07-26 ENCOUNTER — Encounter: Payer: Self-pay | Admitting: Family Medicine

## 2023-07-26 MED ORDER — OLMESARTAN MEDOXOMIL-HCTZ 40-25 MG PO TABS: 1.0000 | ORAL_TABLET | Freq: Every day | ORAL | 3 refills | Status: DC

## 2023-07-26 NOTE — Telephone Encounter (Signed)
Noted  

## 2023-07-27 ENCOUNTER — Telehealth: Payer: Self-pay | Admitting: Internal Medicine

## 2023-07-27 NOTE — Telephone Encounter (Signed)
Pt had CT Scan and Wants Dr Tenny Craw to review it because it showed some new cardiac findings. Pt said she sent Patient Message but has not heard back. Please advise

## 2023-07-27 NOTE — Telephone Encounter (Addendum)
This has been addressed and sent to Dr Tenny Craw in earlier encounter as she is not in the office this week.  Please see that encounter for complete details.

## 2023-07-28 ENCOUNTER — Ambulatory Visit: Payer: Self-pay | Admitting: Adult Health

## 2023-07-28 ENCOUNTER — Telehealth: Payer: Self-pay | Admitting: *Deleted

## 2023-07-28 NOTE — Telephone Encounter (Signed)
Spoke with patient. Explained appointment today has been canceled due to provider illness. Pt verbalized understanding and requested next available appointment with Dr. Tenny Craw or APP to discuss results of recent CT scan. She is willing to see someone in [place] or [place]. Advised will forward to schedulers for assistance. Pt expressed agreement with plan.

## 2023-07-28 NOTE — Telephone Encounter (Signed)
Patient is scheduled for an OV with Juliane Lack, NP, on 07/29/23.

## 2023-07-29 ENCOUNTER — Encounter: Payer: Self-pay | Admitting: Nurse Practitioner

## 2023-07-29 ENCOUNTER — Ambulatory Visit: Payer: Self-pay | Attending: Nurse Practitioner | Admitting: Nurse Practitioner

## 2023-07-29 VITALS — BP 124/72 | HR 71 | Ht 64.0 in | Wt 190.0 lb

## 2023-07-29 DIAGNOSIS — E785 Hyperlipidemia, unspecified: Secondary | ICD-10-CM

## 2023-07-29 DIAGNOSIS — I2510 Atherosclerotic heart disease of native coronary artery without angina pectoris: Secondary | ICD-10-CM

## 2023-07-29 DIAGNOSIS — I6522 Occlusion and stenosis of left carotid artery: Secondary | ICD-10-CM

## 2023-07-29 DIAGNOSIS — Z86711 Personal history of pulmonary embolism: Secondary | ICD-10-CM

## 2023-07-29 DIAGNOSIS — I10 Essential (primary) hypertension: Secondary | ICD-10-CM

## 2023-07-29 NOTE — Patient Instructions (Addendum)
Medication Instructions:  Your physician recommends that you continue on your current medications as directed. Please refer to the Current Medication list given to you today.  *If you need a refill on your cardiac medications before your next appointment, please call your pharmacy*   Lab Work: NONE ordered at this time of appointment     Testing/Procedures: Your physician has requested that you have a carotid duplex. This test is an ultrasound of the carotid arteries in your neck. It looks at blood flow through these arteries that supply the brain with blood. Allow one hour for this exam. There are no restrictions or special instructions.    Follow-Up: At [facility], you and your health needs are our priority.  As part of our continuing mission to provide you with exceptional heart care, we have created designated Provider Care Teams.  These Care Teams include your primary Cardiologist (physician) and Advanced Practice Providers (APPs -  Physician Assistants and Nurse Practitioners) who all work together to provide you with the care you need, when you need it.  We recommend signing up for the patient portal called "MyChart".  Sign up information is provided on this After Visit Summary.  MyChart is used to connect with patients for Virtual Visits (Telemedicine).  Patients are able to view lab/test results, encounter notes, upcoming appointments, etc.  Non-urgent messages can be sent to your provider as well.   To learn more about what you can do with MyChart, go to [email or web address].    Your next appointment:   3 month(s)  Provider:   Bernadene Person, NP        Other Instructions  Dr. Cristal Deer, Dr. Jacques Navy

## 2023-07-29 NOTE — Progress Notes (Signed)
Office Visit    Patient Name: NIJHA KORPELA Date of Encounter: 07/29/2023  Primary Care Provider:  Willow Ora, MD Primary Cardiologist:  Dietrich Pates, MD  Chief Complaint    62 year old female with history of nonobstructive CAD, carotid artery stenosis, PE/DVT, hypertension, and hyperlipidemia who presents for follow-up related to CAD and hypertension.   Past Medical History    Past Medical History:  Diagnosis Date   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   Allergy    Calculus of gallbladder with acute cholecystitis, without mention of obstruction 07/03/2013   Clotting disorder (HCC)    DVT (deep vein thrombosis) in pregnancy    Gallstones    Hyperlipidemia    Pulmonary embolism (HCC)    Seizures (HCC)    Past Surgical History:  Procedure Laterality Date   CHOLECYSTECTOMY N/A 07/02/2013   Procedure: LAPAROSCOPIC CHOLECYSTECTOMY WITH INTRAOPERATIVE CHOLANGIOGRAM;  Surgeon: Ernestene Mention, MD;  Location: [facility];  Service: General;  Laterality: N/A;   LEFT HEART CATH AND CORONARY ANGIOGRAPHY N/A 04/20/2019   Procedure: LEFT HEART CATH AND CORONARY ANGIOGRAPHY;  Surgeon: Lyn Records, MD;  Location: [facility];  Service: Cardiovascular;  Laterality: N/A;    Allergies  Allergies  Allergen Reactions   Tegretol [Carbamazepine] Hives   Ace Inhibitors Cough   Keppra [Levetiracetam]     Feels outside her body.[name]   Ketorolac Tromethamine    Sulfa Antibiotics Hives   Lisinopril-Hydrochlorothiazide Cough     Labs/Other Studies Reviewed    The following studies were reviewed today:  Cardiac Studies & Procedures   CARDIAC CATHETERIZATION  CARDIAC CATHETERIZATION 04/20/2019  Narrative  Mild to moderate LAD, circumflex, and right coronary calcification.  25 to 30% proximal to mid LAD.  40 to 50% eccentric proximal to mid circumflex.  30% mid RCA.  Normal LV function.  LVEDP is normal.  RECOMMENDATIONS:   Optimal medical therapy for nonobstructive  coronary atherosclerosis: Lifestyle modifications as needed for heart health, aggressive lipid-lowering, assessment of glycemic control, blood pressure control.  In context of clinical symptoms, intervention is not appropriate.  FFR in circumflex is borderline significant.  This should be followed clinically over time with PCI to be considered if symptoms not controllable with medical therapy.  Findings Coronary Findings Diagnostic  Dominance: Right  Left Anterior Descending Prox LAD to Mid LAD lesion is 30% stenosed.  Left Circumflex Prox Cx to Mid Cx lesion is 45% stenosed.  Right Coronary Artery Prox RCA to Mid RCA lesion is 25% stenosed.  Intervention  No interventions have been documented.    ECHOCARDIOGRAM  ECHOCARDIOGRAM COMPLETE 02/09/2019  Narrative ECHOCARDIOGRAM REPORT    Patient Name:   Amberleigh JASIA BERTCH Date of Exam: 02/09/2019 Medical Rec #:  [id number]   Height:       64.0 in Accession #:    [id number]  Weight:       190.9 lb Date of Birth:  Aug 09, 1961  BSA:          1.92 m Patient Age:    57 years    BP:           142/81 mmHg Patient Gender: F           HR:           86 bpm. Exam Location:  Inpatient   Procedure: 2D Echo, Cardiac Doppler and Color Doppler  Indications:    Pulmonary Embolism 415.19 / I26.99  History:        Patient has no prior  history of Echocardiogram examinations. Chest Pain, Shortness of Breath, Dyspnea on Exertion, Elevated Troponins, Deep Vein Thrombosis, Bilateral Pulmonary Embolims.  Sonographer:    Belva Chimes RDCS (AE) Referring Phys: [id number] JAMES KIM  IMPRESSIONS   1. The left ventricle has normal systolic function with an ejection fraction of 60-65%. The cavity size was normal. There is mildly increased left ventricular wall thickness. Left ventricular diastolic Doppler parameters are consistent with impaired relaxation. Indeterminate filling pressures The E/e' is 8-15. No evidence of left ventricular regional wall motion  abnormalities. 2. The mitral valve is grossly normal. 3. The tricuspid valve is grossly normal. 4. The aortic valve is tricuspid. Aortic valve regurgitation is trivial by color flow Doppler. No stenosis of the aortic valve. 5. The inferior vena cava was dilated in size with <50% respiratory variability.  SUMMARY  LVEF 60-65%, mild LVH, normal wall motion, grade 1 DD, indeterminate LV filling pressure, low normal RVEF (34% FAC, TAPSE 1.22, RV s' 9.5 cm/sec), dilated IVC that does not collapse FINDINGS Left Ventricle: The left ventricle has normal systolic function, with an ejection fraction of 60-65%. The cavity size was normal. There is mildly increased left ventricular wall thickness. Left ventricular diastolic Doppler parameters are consistent with impaired relaxation. Indeterminate filling pressures The E/e' is 8-15. No evidence of left ventricular regional wall motion abnormalities..  Right Ventricle: The right ventricle has low normal systolic function. The cavity was normal. There is no increase in right ventricular wall thickness.  Left Atrium: Left atrial size was normal in size.  Right Atrium: Right atrial size was normal in size. Right atrial pressure is estimated at 10 mmHg.  Interatrial Septum: No atrial level shunt detected by color flow Doppler.  Pericardium: There is no evidence of pericardial effusion.  Mitral Valve: The mitral valve is grossly normal. Mitral valve regurgitation is trivial by color flow Doppler.  Tricuspid Valve: The tricuspid valve is grossly normal. Tricuspid valve regurgitation is trivial by color flow Doppler.  Aortic Valve: The aortic valve is tricuspid Aortic valve regurgitation is trivial by color flow Doppler. There is No stenosis of the aortic valve, with a calculated valve area of 2.25 cm.  Pulmonic Valve: The pulmonic valve was grossly normal. Pulmonic valve regurgitation is not visualized by color flow Doppler.  Venous: The inferior vena  cava measures 2.38 cm, is dilated in size with less than 50% respiratory variability.   +--------------+--------++ LEFT VENTRICLE              +----------------+----------++ +--------------+--------++      Diastology                 PLAX 2D                     +----------------+----------++ +--------------+--------++      LV e' lateral:  10.60 cm/s LVIDd:        3.75 cm       +----------------+----------++ +--------------+--------++      LV E/e' lateral:3.4        LVIDs:        2.29 cm       +----------------+----------++ +--------------+--------++      LV e' medial:   4.90 cm/s  LV PW:        1.15 cm       +----------------+----------++ +--------------+--------++      LV E/e' medial: 7.4        LV IVS:       1.02 cm       +----------------+----------++ +--------------+--------++  LVOT diam:    2.00 cm  +--------------+--------++ LV SV:        42 ml    +--------------+--------++ LV SV Index:  20.93    +--------------+--------++ LVOT Area:    3.14 cm +--------------+--------++                        +--------------+--------++  +------------------+---------++ LV Volumes (MOD)            +------------------+---------++ LV area d, A2C:   27.70 cm +------------------+---------++ LV area d, A4C:   27.90 cm +------------------+---------++ LV area s, A2C:   14.20 cm +------------------+---------++ LV area s, A4C:   17.10 cm +------------------+---------++ LV major d, A2C:  7.96 cm   +------------------+---------++ LV major d, A4C:  7.65 cm   +------------------+---------++ LV major s, A2C:  5.88 cm   +------------------+---------++ LV major s, A4C:  6.81 cm   +------------------+---------++ LV vol d, MOD A2C:81.1 ml   +------------------+---------++ LV vol d, MOD A4C:83.2 ml   +------------------+---------++ LV vol s, MOD A2C:30.0 ml    +------------------+---------++ LV vol s, MOD A4C:37.3 ml   +------------------+---------++ LV SV MOD A2C:    51.1 ml   +------------------+---------++ LV SV MOD A4C:    83.2 ml   +------------------+---------++ LV SV MOD BP:     47.7 ml   +------------------+---------++  +---------------+---------++ RIGHT VENTRICLE          +---------------+---------++ RV Basal diam: 3.92 cm   +---------------+---------++ RV Mid diam:   3.76 cm   +---------------+---------++ RV FAC:        34.0 %    +---------------+---------++ RV S prime:    9.57 cm/s +---------------+---------++ TAPSE (M-mode):1.2 cm    +---------------+---------++ RVSP:          16.8 mmHg +---------------+---------++  +---------------+-------++-----------++ LEFT ATRIUM           Index       +---------------+-------++-----------++ LA diam:       3.20 cm1.67 cm/m  +---------------+-------++-----------++ LA Vol (A2C):  57.0 ml29.72 ml/m +---------------+-------++-----------++ LA Vol (A4C):  49.6 ml25.86 ml/m +---------------+-------++-----------++ LA Biplane Vol:53.5 ml27.89 ml/m +---------------+-------++-----------++ +------------+----------++-----------++ RIGHT ATRIUM          Index       +------------+----------++-----------++ RA Pressure:15.00 mmHg            +------------+----------++-----------++ RA Area:    15.00 cm             +------------+----------++-----------++ RA Volume:  37.10 ml  19.34 ml/m +------------+----------++-----------++ +------------------+-----------++ AORTIC VALVE                  +------------------+-----------++ AV Area (Vmax):   2.24 cm    +------------------+-----------++ AV Area (Vmean):  2.32 cm    +------------------+-----------++ AV Area (VTI):    2.25 cm    +------------------+-----------++ AV Vmax:          154.00  cm/s +------------------+-----------++ AV Vmean:         96.800 cm/s +------------------+-----------++ AV VTI:           0.218 m     +------------------+-----------++ AV Peak Grad:     9.5 mmHg    +------------------+-----------++ AV Mean Grad:     4.0 mmHg    +------------------+-----------++ LVOT Vmax:        110.00 cm/s +------------------+-----------++ LVOT Vmean:       71.400 cm/s +------------------+-----------++ LVOT VTI:         0.156 m     +------------------+-----------++ LVOT/AV VTI ratio:0.72        +------------------+-----------++  +-------------+-------++  AORTA                +-------------+-------++ Ao Root diam:3.40 cm +-------------+-------++ Ao Asc diam: 3.20 cm +-------------+-------++  +--------------+----------++ +---------------+----------++ MITRAL VALVE             TRICUSPID VALVE           +--------------+----------++ +---------------+----------++ MV Area (PHT):3.12 cm   TR Peak grad:  1.8 mmHg   +--------------+----------++ +---------------+----------++ MV PHT:       70.47 msec TR Vmax:       67.90 cm/s +--------------+----------++ +---------------+----------++ MV Decel Time:243 msec   Estimated RAP: 15.00 mmHg +--------------+----------++ +---------------+----------++ +--------------+----------++ RVSP:          16.8 mmHg  MV E velocity:36.40 cm/s +---------------+----------++ +--------------+----------++ MV A velocity:75.00 cm/s +--------------+-------+ +--------------+----------++ SHUNTS                MV E/A ratio: 0.49       +--------------+-------+ +--------------+----------++ Systemic VTI: 0.16 m  +--------------+-------+ Systemic Diam:2.00 cm +--------------+-------+  +-------------------+----------+ +---------+-------+ PULMONARY VEINS               IVC              +-------------------+----------+  +---------+-------+ Diastolic Velocity:36.40 cm/s IVC diam:2.38 cm +-------------------+----------+ +---------+-------+ S/D Velocity:      1.60       +-------------------+----------+ Systolic Velocity: 58.70 cm/s +-------------------+----------+   Zoila Shutter MD Electronically signed by Zoila Shutter MD Signature Date/Time: 02/09/2019/3:31:16 PM    Final    CT SCANS  CT CORONARY FRACTIONAL FLOW RESERVE DATA PREP 04/07/2019  Narrative EXAM: CT FFR ANALYSIS  CLINICAL DATA:  Chest pain  FINDINGS: FFRct analysis was performed on the original cardiac CT angiogram dataset. Diagrammatic representation of the FFRct analysis is provided in a separate PDF document in PACS. This dictation was created using the PDF document and an interactive 3D model of the results. 3D model is not available in the EMR/PACS. Normal FFR range is >0.80.  1. Left Main: No significant stenosis.  2. LAD: FFR across pLAD lesion 0.79, indicating significant stenosis. 3. LCX: FFR across pLCX lesion is 0.71, indicating significant stenosis. 4. RCA: no significant stenosis.  IMPRESSION: 1.  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  Lennie Odor, MD   Electronically Signed By: Lennie Odor On: 04/08/2019 06:48   CT CORONARY MORPH W/CTA COR W/SCORE 04/07/2019  Addendum 04/07/2019 11:51 AM ADDENDUM REPORT: 04/07/2019 11:49  EXAM: OVER-READ INTERPRETATION  CT CHEST  The following report is an over-read performed by radiologist Dr. Lesia Hausen [facility] on 04/07/2019. This over-read does not include interpretation of cardiac or coronary anatomy or pathology. The coronary CTA interpretation by the cardiologist is attached.  COMPARISON:  03/27/2019 chest CT angiogram.  FINDINGS: Cardiovascular: No significant pericardial effusion/thickening. Great vessels are normal in course and caliber. Subacute small thrombus within left lower lobe  pulmonary artery is unchanged since 03/27/2019. No acute interval central pulmonary emboli.  Mediastinum/Nodes: Unremarkable esophagus. No pathologically enlarged mediastinal or hilar lymph nodes.  Lungs/Pleura: No pneumothorax. No pleural effusion. No acute consolidative airspace disease, lung masses or significant pulmonary nodules. Subsegmental scarring versus atelectasis in the basilar right lower lobe.  Upper abdomen: No acute abnormality.  Musculoskeletal: No aggressive appearing focal osseous lesions. Mild thoracic spondylosis.  IMPRESSION: Subacute small lobar pulmonary embolus in the left lower lobe, unchanged since 03/27/2019 chest CT. Mild right basilar scarring versus atelectasis. Otherwise no significant extracardiac findings.   Electronically Signed  By: Delbert Phenix M.D. On: 04/07/2019 11:49  Addendum 04/06/2019  7:02 PM ADDENDUM REPORT: 04/06/2019 18:59  ADDENDUM: Please ignore prior reports by Dr. Duke Salvia and Dr. Flora Lipps from 3:02 PM, 3:30 PM, and 5:15 PM. These reports are incorrect due to error.  CLINICAL DATA:  Chest pain  EXAM: Cardiac/Coronary CTA  TECHNIQUE: The patient was scanned on a [employer]. A 100 kV prospective scan was triggered in the descending thoracic aorta at 111 HU's. Axial non-contrast 3 mm slices were carried out through the heart. The data set was analyzed on a dedicated work station and scored using the Agatson method. Gantry rotation speed was 250 msecs and collimation was .6 mm. No beta blockade and 0.8 mg of sl NTG was given. The 3D data set was reconstructed in 5% intervals of the 35-75 % of the R-R cycle. Diastolic phases were analyzed on a dedicated work station using MPR, MIP and VRT modes. The patient received 80 cc of contrast.  FINDINGS: Image quality: Excellent.  Noise artifact is: Limited.  Coronary Arteries:  Normal coronary origin.  Right dominance.  Left main: The left main is a large caliber  vessel with a normal take off from the left coronary cusp that bifurcates to form a left anterior descending artery and a left circumflex artery. There is no plaque or stenosis.  Left anterior descending artery: There is a severe mixed density stenosis in the prox (70-99%). The mid LAD contains mild calcified plaque (25-49%). The distal LAD contains minimal calcified plaque (<25%). The LAD gives off 2 patent diagonal branches.  Left circumflex artery: The proximal LCX contains moderate non-calcified plaque (50-69%). There is minimal calcified plaque distal to this lesion (<25%). The LCX terminates as a large patent obtuse marginal branch.  Right coronary artery: The RCA is dominant. The mRCA contains mild calcified plaque (25-49%). The distal RCA contains mild non-calcified plaque (25-49%).The RCA terminates as a PDA and right posterolateral branch without evidence of plaque or stenosis.  Right Atrium: Right atrial size is dilated.  Right Ventricle: The right ventricular cavity is dilated.  Left Atrium: Left atrial size is normal in size with no left atrial appendage filling defect.  Left Ventricle: The ventricular cavity size is within normal limits. There are no stigmata of prior infarction. There is no abnormal filling defect.  Pulmonary veins: Normal pulmonary venous drainage. 1 common left vein and 2 right veins.  Pericardium: Normal thickness with no significant effusion or calcium present.  Cardiac valves: The aortic valve is trileaflet without significant calcification. The mitral valve is normal structure without significant calcification.  Ascending aorta: Normal caliber with no significant disease.  Extra-cardiac findings: See attached radiology report for non-cardiac structures.  IMPRESSION: 1. Coronary calcium score of 307. This was 97th percentile for age and sex matched control.  2. Severe stenosis in the proximal LAD and moderate stenosis in  the proximal LCX.  3. Dilated right atrium and right ventricle.  RECOMMENDATIONS: 1. CAD-RADS 4: Cardiac catheterization is recommended to evaluate the LAD lesion. CT-FFR will be sent to determine significance of the LCX.  Lennie Odor, MD   Electronically Signed By: Lennie Odor On: 04/06/2019 18:59  Addendum 04/06/2019  5:18 PM ADDENDUM REPORT: 04/06/2019 17:15  ADDENDUM: Please ignore report by Dr. Chilton Si. This report was entered in error. The following report is the correct report.  CLINICAL DATA:  Chest pain  EXAM: Cardiac/Coronary CTA  TECHNIQUE: The patient was scanned on a [employer]. A 100  kV prospective scan was triggered in the descending thoracic aorta at 111 HU's. Axial non-contrast 3 mm slices were carried out through the heart. The data set was analyzed on a dedicated work station and scored using the Agatson method. Gantry rotation speed was 250 msecs and collimation was .6 mm. No beta blockade and 0.8 mg of sl NTG was given. The 3D data set was reconstructed in 5% intervals of the 35-65 % of the R-R cycle. Diastolic phases were analyzed on a dedicated work station using MPR, MIP and VRT modes. The patient received 80 cc of contrast.  FINDINGS: Image quality: Excellent.  Noise artifact is: Limited.  Coronary Arteries:  Normal coronary origin.  Right dominance.  Left main: The left main is a large caliber vessel with a normal take off from the left coronary cusp that trifurcates into a LAD, LCX, and ramus intermedius. There is no plaque or stenosis.  Left anterior descending artery: The LAD is patent without evidence of plaque or stenosis. The LAD gives off 2 patent diagonal branches.  Ramus intermedius: Patent with no evidence of plaque or stenosis.  Left circumflex artery: The LCX is non-dominant and patent with no evidence of plaque or stenosis. The LCX gives off 2 patent obtuse marginal branches.  Right coronary  artery: The RCA is dominant with normal take off from the right coronary cusp. There is no evidence of plaque or stenosis. The RCA terminates as a PDA without evidence of plaque or stenosis.  Right Atrium: Right atrial size is within normal limits.  Right Ventricle: The right ventricular cavity is within normal limits.  Left Atrium: Left atrial size is normal in size with no left atrial appendage filling defect.  Left Ventricle: The ventricular cavity size is within normal limits. There are no stigmata of prior infarction. There is no abnormal filling defect.  Pulmonary arteries: Normal in size without proximal filling defect.  Pulmonary veins: Normal pulmonary venous drainage.  Pericardium: Normal thickness with no significant effusion or calcium present.  Cardiac valves: The aortic valve is trileaflet without significant calcification. The mitral valve is normal structure without significant calcification.  Aorta: Normal caliber with no significant disease.  Extra-cardiac findings: See attached radiology report for non-cardiac structures.  IMPRESSION: 1. Coronary calcium score of 0.  2. Normal coronary origin with right dominance.  3. No evidence of CAD.  RECOMMENDATIONS: 1. CAD-RADS 0: No evidence of CAD (0%). Consider non-atherosclerotic causes of chest pain.  Lennie Odor, MD   Electronically Signed By: Lennie Odor On: 04/06/2019 17:15  Addendum 04/06/2019  3:33 PM ADDENDUM REPORT: 04/06/2019 15:30  ADDENDUM: The report signed on 04/06/19 at 15:02 was signed in error. This report was not based on Ms. Nadya [employer]. Please disregard.  Chilton Si, MD   Electronically Signed By: Chilton Si On: 04/06/2019 15:30  Narrative CLINICAL DATA:  24F with DVT/PE, family history of premature CAD and atypical chest pain.  EXAM: Cardiac/Coronary  CT  TECHNIQUE: The patient was scanned on a [employer].  FINDINGS: A 120 kV  prospective scan was triggered in the descending thoracic aorta at 111 HU's. Axial non-contrast 3 mm slices were carried out through the heart. The data set was analyzed on a dedicated work station and scored using the Agatson method. Gantry rotation speed was 250 msecs and collimation was .6 mm. No beta blockade and 0.8 mg of sl NTG was given. The 3D data set was reconstructed in 5% intervals of the 67-82 % of  the R-R cycle. Diastolic phases were analyzed on a dedicated work station using MPR, MIP and VRT modes. The patient received 80 cc of contrast.  Aorta: Normal size.  No calcifications.  No dissection.  Aortic Valve:  Trileaflet.  No calcifications.  Coronary Arteries:  Normal coronary origin.  Right dominance.  RCA is a large dominant artery that gives rise to PDA and PLVB. There is no plaque.  Left main is a large artery that gives rise to LAD, LCX and RI arteries.  LAD is a large vessel that has no plaque.  LCX is a non-dominant artery that gives rise to one large OM1 branch. There is no plaque.  RI branches at the ostium and is without disease.  Other findings:  Normal pulmonary vein drainage into the left atrium.  Normal let atrial appendage without a thrombus.  Normal size of the pulmonary artery.  IMPRESSION: 1. Coronary calcium score of 0. This was 0 percentile for age and sex matched control.  2. Normal coronary origin with right dominance.  3. No evidence of CAD.  Chilton Si, MD  Electronically Signed: By: Chilton Si On: 04/06/2019 15:02         Recent Labs: 12/17/2022: Magnesium 2.1 06/24/2023: ALT 28; Hemoglobin 13.4; Platelets 268.0; TSH 0.59 07/05/2023: BUN 27; Creatinine, Ser 0.89; Potassium 4.0; Sodium 138  Recent Lipid Panel    Component Value Date/Time   CHOL 163 06/23/2022 1022   TRIG 71 03/11/2023 1059   HDL 67 03/11/2023 1059   CHOLHDL 3 06/23/2022 1022   VLDL 18.4 06/23/2022 1022   LDLCALC 83 06/23/2022 1022    LDLCALC 99 06/24/2020 1353    History of Present Illness    62 year old female with the above past medical history including nonobstructive CAD, carotid artery stenosis,  PE/DVT, hypertension, and hyperlipidemia.  Cardiac catheterization in 2020 revealed nonobstructive CAD with 40 to 50% stenosis in the proximal/mid LCx. 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  She was last seen in the office on 03/11/2023 and was stable from a cardiac standpoint.  She denied symptoms concerning for angina.  CT cervical spine in 07/2023 showed evidence of left carotid artery, right vertebral artery stenosis.  She presents today for follow-up.  Since her last visi she has been stable overall from a cardiac standpoint.  She does note increased shortness of breath with activity, this has been in the setting of significant back problems.  She denies any chest pain.  She denies palpitations, dizziness, syncope, syncope, edema, PND, orthopnea, weight gain.   Home Medications    Current Outpatient Medications  Medication Sig Dispense Refill   atorvastatin (LIPITOR) 80 MG tablet TAKE 1 TABLET(80 MG) BY MOUTH DAILY 90 tablet 3   ezetimibe (ZETIA) 10 MG tablet TAKE 1 TABLET(10 MG) BY MOUTH DAILY 90 tablet 3   olmesartan-hydrochlorothiazide (BENICAR HCT) 40-25 MG tablet Take 1 tablet by mouth daily. 90 tablet 3   PHENobarbital (LUMINAL) 64.8 MG tablet Take 1.5 tablets (97.2 mg total) by mouth at bedtime. 135 tablet 1   XARELTO 20 MG TABS tablet TAKE 1 TABLET(20 MG) BY MOUTH DAILY WITH SUPPER 30 tablet 3   No current facility-administered medications for this visit.     Review of Systems    She denies chest pain, palpitations, pnd, orthopnea, n, v, dizziness, syncope, edema, weight gain, or early satiety. All other systems reviewed and are otherwise negative except as noted above.   Physical Exam  VS:  BP 124/72 (BP Location: Right Arm, Patient Position: Sitting, Cuff  Size: Normal)   Pulse 71   Ht 5\' 4"  (1.626 m)   Wt 190 lb (86.2 kg)   SpO2 94%   BMI 32.61 kg/m  GEN: Well nourished, well developed, in no acute distress. HEENT: normal. Neck: Supple, no JVD, carotid bruits, or masses. Cardiac: RRR, no murmurs, rubs, or gallops. No clubbing, cyanosis, edema.  Radials/DP/PT 2+ and equal bilaterally.  Respiratory:  Respirations regular and unlabored, clear to auscultation bilaterally. GI: Soft, nontender, nondistended, BS + x 4. MS: no deformity or atrophy. Skin: warm and dry, no rash. Neuro:  Strength and sensation are intact. Psych: Normal affect.  Accessory Clinical Findings    ECG personally reviewed by me today - EKG Interpretation Date/Time:  Friday July 29 2023 15:18:36 EST Ventricular Rate:  66 PR Interval:  158 QRS Duration:  84 QT Interval:  408 QTC Calculation: 427 R Axis:   17  Text Interpretation: Normal sinus rhythm Normal ECG When compared with ECG of 11-Mar-2023 10:14, No significant change was found Confirmed by Bernadene Person ([id number]) on 07/29/2023 3:28:13 PM  - no acute changes.   Lab Results  Component Value Date   WBC 8.5 06/24/2023   HGB 13.4 06/24/2023   HCT 39.6 06/24/2023   MCV 95.4 06/24/2023   PLT 268.0 06/24/2023   Lab Results  Component Value Date   CREATININE 0.89 07/05/2023   BUN 27 (H) 07/05/2023   NA 138 07/05/2023   K 4.0 07/05/2023   CL 100 07/05/2023   CO2 30 07/05/2023   Lab Results  Component Value Date   ALT 28 06/24/2023   AST 23 06/24/2023   ALKPHOS 127 (H) 06/24/2023   BILITOT 0.5 06/24/2023   Lab Results  Component Value Date   CHOL 163 06/23/2022   HDL 67 03/11/2023   LDLCALC 83 06/23/2022   TRIG 71 03/11/2023   CHOLHDL 3 06/23/2022    Lab Results  Component Value Date   HGBA1C 5.7 06/24/2023    Assessment & Plan   1. Nonobstructive CAD: Cardiac catheterization in 2020 revealed nonobstructive CAD with 40 to 50% stenosis in the proximal/mid LCx.  She notes increased  shortness of breath with activity, this has been in the setting of significant back problems. She denies chest pain. Euvolemic and well compensated on exam. We discussed possible repeat echocardiogram. However, she is self-pay, will defer for now.  Continue to monitor symptoms.  Continue Lipitor, Zetia.  2. Carotid artery stenosis: CT cervical spine in 07/2023 showed evidence of left carotid artery, right vertebral artery stenosis.  Asymptomatic, no bruit on exam.  Will check carotid ultrasound.  No ASA in the setting of chronic Xarelto.  Continue Lipitor, Zetia.  3. 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  4. Hypertension: BP well controlled. Continue current antihypertensive regimen.   5. Hyperlipidemia: LDL was 68 in 03/2023.  Continue Lipitor, Zetia.  6. Disposition: Follow-up in 3 months.  She is interested in transitioning to a new cardiologist and will notify us when she makes a decision so that we may request a change.      Joylene Grapes, NP 07/29/2023, 5:22 PM

## 2023-08-01 ENCOUNTER — Encounter: Payer: Self-pay | Admitting: Neurology

## 2023-08-01 ENCOUNTER — Ambulatory Visit (INDEPENDENT_AMBULATORY_CARE_PROVIDER_SITE_OTHER): Payer: Self-pay | Admitting: Neurology

## 2023-08-01 VITALS — BP 138/83 | HR 78 | Ht 61.0 in | Wt 190.0 lb

## 2023-08-01 DIAGNOSIS — W19XXXD Unspecified fall, subsequent encounter: Secondary | ICD-10-CM | POA: Insufficient documentation

## 2023-08-01 DIAGNOSIS — R269 Unspecified abnormalities of gait and mobility: Secondary | ICD-10-CM

## 2023-08-01 DIAGNOSIS — Z780 Asymptomatic menopausal state: Secondary | ICD-10-CM

## 2023-08-01 DIAGNOSIS — Z5320 Procedure and treatment not carried out because of patient's decision for unspecified reasons: Secondary | ICD-10-CM

## 2023-08-01 MED ORDER — PHENOBARBITAL 64.8 MG PO TABS
ORAL_TABLET | ORAL | 1 refills | Status: DC
Start: 2023-08-01 — End: 2023-11-08

## 2023-08-01 NOTE — Progress Notes (Signed)
Provider:  Melvyn Novas, MD  Primary Care Physician:  Willow Ora, MD [address] [place] [address]     Referring Provider: Willow Ora, Md [address] [place],  [place] [address]          Chief Complaint according to patient   Patient presents with:     New Patient (Initial Visit)           HISTORY OF PRESENT ILLNESS:  Sarah Chung is a 62 y.o. female patient who is here for revisit 08/01/2023 for  results of recent neurological imaging, reports increasing SOB, tingling on the left back, not hurting.     62 year old female with history of nonobstructive CAD, carotid artery stenosis, PE/DVT, hypertension, and hyperlipidemia who presents for follow-up related to CAD and hypertension.   Sarah Chung is a 62 y.o. female patient who is here for revisit 06/06/2023 for her concern of a recent fall and possible injury to her sciatic nerve, she fell on a tile floor and hit the pelvic bottom, the pubic bone.  In September she went to [employer] in [facility] and when she woke up there noted loss of control of the left ankle.    Chief concern according to patient :  " I have had several spells of unusual gait impairment, on the beach, at [place] and at the [employer], before the  [employer], (not a seizure related thing). Before my fall"       Assessment per Dr Mardelle Matte: 1. Leg heaviness   2. Acute left ankle pain   3. Right sided sciatica     Leg heaviness: Patient's neurologic exam was fluctuating.  Had trouble walking at her heels and toes but seem to have fairly good strength when I tested her.    Can get EMG/NCV testing if needed- but it is affordable to her, waiting time for EMG NCV is 6-8 weeks.Recent lab work was significant for a low vitamin D.  She is on supplements now. Ankle pain: Fortunately this has resolved.  Possibly gout or other. Right sciatica: if this persist, would recommend physical therapy.  Patient is  hesitant because of cost.  She did also follow-up with sports medicine. She is scheduled for complete physical upcoming 06/22/2023 and may get a her labs then. CK MB.      Past Medical History        Past Medical History:  Diagnosis Date   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   Allergy     Calculus of gallbladder with acute cholecystitis, without mention of obstruction 07/03/2013   Clotting disorder (HCC)     DVT (deep vein thrombosis) in pregnancy     Gallstones     Hyperlipidemia     Pulmonary embolism (HCC)     Seizures (HCC)     .        Review of Systems: Out of a complete 14 system review, the patient complains of only the following symptoms, and all other reviewed systems are negative.:   Social History   Socioeconomic History   Marital status: Divorced    Spouse name: [name]   Number of children: 2   Years of education: College   Highest education level: Bachelor's degree (e.g., BA, AB, BS)  Occupational History   Occupation: Musician: [employer]  Tobacco Use   Smoking status: Never   Smokeless tobacco: Never  Vaping Use   Vaping status: Never Used  Substance and Sexual Activity   Alcohol use: No   Drug use: No   Sexual activity: Yes    Birth control/protection: Post-menopausal  Other Topics Concern   Not on file  Social History Narrative   Patient is married ([name]) and lives at home with his husband.   Patient has two adult children.   Patient is working full-time.   Patient has a college education.   Patient is right-handed.   Patient drinks two cups of coffee and some soda.   Social Drivers of Corporate investment banker Strain: Not on file  Food Insecurity: No Food Insecurity (12/17/2022)   Hunger Vital Sign    Worried About Running Out of Food in the Last Year: Never true    Ran Out of Food in the Last Year: Never true  Transportation Needs: No Transportation Needs (12/17/2022)   PRAPARE - Scientist, research (physical sciences) (Medical): No    Lack of Transportation (Non-Medical): No  Physical Activity: Sufficiently Active (12/17/2022)   Exercise Vital Sign    Days of Exercise per Week: 7 days    Minutes of Exercise per Session: 70 min  Stress: No Stress Concern Present (12/17/2022)   [employer] of Occupational Health - Occupational Stress Questionnaire    Feeling of Stress : Not at all  Social Connections: Unknown (12/17/2022)   Social Connection and Isolation Panel [NHANES]    Frequency of Communication with Friends and Family: More than three times a week    Frequency of Social Gatherings with Friends and Family: Three times a week    Attends Religious Services: More than 4 times per year    Active Member of Clubs or Organizations: Not on file    Attends Banker Meetings: Not on file    Marital Status: Divorced    Family History  Problem Relation Age of Onset   Heart attack Father    Parkinson's disease Father    Heart disease Father    Heart disease Brother    Heart attack Brother    Mental illness Mother    [name] disease Mother    Depression Sister    Kidney disease Maternal Grandfather    Heart attack Paternal Grandmother     Past Medical History:  Diagnosis Date   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   Allergy    Calculus of gallbladder with acute cholecystitis, without mention of obstruction 07/03/2013   Clotting disorder (HCC)    DVT (deep vein thrombosis) in pregnancy    Gallstones    Hyperlipidemia    Pulmonary embolism (HCC)    Seizures (HCC)     Past Surgical History:  Procedure Laterality Date   CHOLECYSTECTOMY N/A 07/02/2013   Procedure: LAPAROSCOPIC CHOLECYSTECTOMY WITH INTRAOPERATIVE CHOLANGIOGRAM;  Surgeon: Ernestene Mention, MD;  Location: [facility];  Service: General;  Laterality: N/A;   LEFT HEART CATH AND CORONARY ANGIOGRAPHY N/A 04/20/2019   Procedure: LEFT HEART CATH AND CORONARY ANGIOGRAPHY;  Surgeon: Lyn Records, MD;  Location: [facility]  INVASIVE CV LAB;  Service: Cardiovascular;  Laterality: N/A;     Current Outpatient Medications on File Prior to Visit  Medication Sig Dispense Refill   atorvastatin (LIPITOR) 80 MG tablet TAKE 1 TABLET(80 MG) BY MOUTH DAILY 90 tablet 3   ezetimibe (ZETIA) 10 MG tablet TAKE 1 TABLET(10 MG) BY MOUTH DAILY 90 tablet 3   olmesartan-hydrochlorothiazide (BENICAR HCT) 40-25 MG tablet Take 1 tablet by  mouth daily. 90 tablet 3   PHENobarbital (LUMINAL) 64.8 MG tablet Take 1.5 tablets (97.2 mg total) by mouth at bedtime. 135 tablet 1   XARELTO 20 MG TABS tablet TAKE 1 TABLET(20 MG) BY MOUTH DAILY WITH SUPPER 30 tablet 3   No current facility-administered medications on file prior to visit.    Allergies  Allergen Reactions   Tegretol [Carbamazepine] Hives   Ace Inhibitors Cough   Keppra [Levetiracetam]     Feels outside her body.[name]   Ketorolac Tromethamine    Sulfa Antibiotics Hives   Lisinopril-Hydrochlorothiazide Cough     DIAGNOSTIC DATA (LABS, IMAGING, TESTING) - I reviewed patient records, labs, notes, testing and imaging myself where available.  Lab Results  Component Value Date   WBC 8.5 06/24/2023   HGB 13.4 06/24/2023   HCT 39.6 06/24/2023   MCV 95.4 06/24/2023   PLT 268.0 06/24/2023      Component Value Date/Time   NA 138 07/05/2023 1052   NA 143 08/27/2021 0928   K 4.0 07/05/2023 1052   CL 100 07/05/2023 1052   CO2 30 07/05/2023 1052   GLUCOSE 81 07/05/2023 1052   BUN 27 (H) 07/05/2023 1052   BUN 14 08/27/2021 0928   CREATININE 0.89 07/05/2023 1052   CREATININE 0.96 06/24/2020 1353   CALCIUM 9.5 07/05/2023 1052   PROT 8.3 06/24/2023 1347   PROT 8.3 08/27/2021 0928   ALBUMIN 4.5 06/24/2023 1347   ALBUMIN 4.5 08/27/2021 0928   AST 23 06/24/2023 1347   ALT 28 06/24/2023 1347   ALKPHOS 127 (H) 06/24/2023 1347   BILITOT 0.5 06/24/2023 1347   BILITOT 0.4 08/27/2021 0928   GFRNONAA >60 08/16/2019 1855   GFRAA >60 08/16/2019 1855   Lab Results  Component  Value Date   CHOL 163 06/23/2022   HDL 67 03/11/2023   LDLCALC 83 06/23/2022   TRIG 71 03/11/2023   CHOLHDL 3 06/23/2022   Lab Results  Component Value Date   HGBA1C 5.7 06/24/2023   No results found for: "VITAMINB12" Lab Results  Component Value Date   TSH 0.59 06/24/2023    PHYSICAL EXAM:  Today's Vitals   08/01/23 1535  BP: 138/83  Pulse: 78  Weight: 190 lb (86.2 kg)  Height: 5\' 1"  (1.549 m)   Body mass index is 35.9 kg/m.   Wt Readings from Last 3 Encounters:  08/01/23 190 lb (86.2 kg)  07/29/23 190 lb (86.2 kg)  06/22/23 185 lb (83.9 kg)     Ht Readings from Last 3 Encounters:  08/01/23 5\' 1"  (1.549 m)  07/29/23 5\' 4"  (1.626 m)  06/22/23 5\' 4"  (1.626 m)      General:  The patient is awake, alert and appears not in acute distress. The patient is well groomed. Head: Normocephalic, atraumatic. Neck is supple. Mallampati 2 , neck circumference: 14 inches. Developed a small hump.  Cardiovascular:  Regular rate and rhythm , without  murmurs or carotid bruit, and without distended neck veins. Respiratory: Lungs are clear to auscultation. Skin:  Without evidence of edema, or rash Trunk: BMI is elevated, 34,normal posture.   Neurologic exam : The patient is awake and alert, oriented to place and time.   Memory subjective described as intact. There is a normal attention span & concentration ability.  Speech is pressured  without  dysarthria, dysphonia or aphasia.  Mood and affect are much proved. She is no longer tearful, appeared less depressed.    Cranial nerves: Pupils are equal and briskly reactive to  light.  Extraocular movements  in vertical and horizontal planes intact.  Visual fields by finger perimetry are intact.  Facial sensation intact to fine touch. Motor exam:   Normal tone, muscle bulk and symmetric strength in all extremities. Sensory:  Fine touch, pinprick and vibration were normal-  Coordination: Rapid alternating movements /Finger-to-nose  maneuver tested and normal without evidence of ataxia, dysmetria or tremor. Gait and station: Patient walks without assistive device. Strength within normal limits.  Deep tendon reflexes: in the  upper and lower extremities are symmetric and intact.       ASSESSMENT AND PLAN 62 y.o. year old female  here with:    1) concerns about increasing SOB, with exercise. No explanation.    2) brain CT and neck spine CT did not show any good explanaition.  There was biforaminal stenosis at [facility]. Left ICA calcification. This does not explain any symptom she has mentioned.   Cardiology has seen her.   3)  I recommend to see a chiropractor, have follow up carotid artery dopplers, stay on Xeralto , use TYLENOL.  4) built up an exercise regimen. .     I plan to follow up either personally or through our NP  prn.   I would like to thank Willow Ora, MD and Willow Ora, Md [address] [place],  [place] [address] for allowing me to meet with and to take care of this pleasant patient.    After spending a total time of  25  minutes face to face and additional time for physical and neurologic examination, review of laboratory studies,  personal review of imaging studies, reports and results of other testing and review of referral information / records as far as provided in visit,   Electronically signed by: Melvyn Novas, MD 08/01/2023 4:05 PM  [facility] Board certified by The [employer] of Sleep Medicine and Diplomate of the [employer] of Sleep Medicine. Board certified In Neurology through the ABPN, Fellow of the [employer] of Neurology.

## 2023-08-01 NOTE — Patient Instructions (Signed)
I have not found an explanation for Sarah Chung's symptoms.   Chiropractor treatments are my recommendation.

## 2023-08-02 ENCOUNTER — Encounter: Payer: Self-pay | Admitting: Family Medicine

## 2023-08-02 ENCOUNTER — Ambulatory Visit (HOSPITAL_COMMUNITY)
Admission: RE | Admit: 2023-08-02 | Discharge: 2023-08-02 | Disposition: A | Discharge status: Home / Self Care | Payer: Self-pay | Source: Ambulatory Visit | Attending: Cardiovascular Disease | Admitting: Cardiovascular Disease

## 2023-08-02 DIAGNOSIS — I6522 Occlusion and stenosis of left carotid artery: Secondary | ICD-10-CM | POA: Insufficient documentation

## 2023-08-04 ENCOUNTER — Other Ambulatory Visit: Payer: Self-pay

## 2023-08-04 ENCOUNTER — Telehealth: Payer: Self-pay

## 2023-08-04 MED ORDER — OLMESARTAN MEDOXOMIL-HCTZ 40-25 MG PO TABS: 1.0000 | ORAL_TABLET | Freq: Every day | ORAL | 3 refills | Status: DC

## 2023-08-04 NOTE — Telephone Encounter (Signed)
Pt states that she was wrapping gifts and she misplaced her new rx for Olmesartan  - I informed pt that I will call in a new RX to her confirmed pharmacy.

## 2023-08-04 NOTE — Telephone Encounter (Signed)
Spoke with pt - escribed new rx for BP medication Olmesartan

## 2023-08-08 ENCOUNTER — Telehealth: Payer: Self-pay

## 2023-08-08 NOTE — Telephone Encounter (Signed)
Carotid u/s results reviewd by pt via mychart.

## 2023-09-06 ENCOUNTER — Ambulatory Visit: Payer: Self-pay | Admitting: Adult Health

## 2023-10-17 ENCOUNTER — Encounter: Payer: Self-pay | Admitting: Family Medicine

## 2023-10-20 ENCOUNTER — Encounter: Payer: Self-pay | Admitting: Family Medicine

## 2023-10-20 ENCOUNTER — Ambulatory Visit (INDEPENDENT_AMBULATORY_CARE_PROVIDER_SITE_OTHER): Payer: Self-pay | Admitting: Family Medicine

## 2023-10-20 VITALS — BP 124/72 | HR 61 | Temp 97.7°F | Ht 64.0 in | Wt 196.8 lb

## 2023-10-20 DIAGNOSIS — R748 Abnormal levels of other serum enzymes: Secondary | ICD-10-CM

## 2023-10-20 DIAGNOSIS — I10 Essential (primary) hypertension: Secondary | ICD-10-CM

## 2023-10-20 DIAGNOSIS — R252 Cramp and spasm: Secondary | ICD-10-CM

## 2023-10-20 DIAGNOSIS — M47892 Other spondylosis, cervical region: Secondary | ICD-10-CM

## 2023-10-20 DIAGNOSIS — E559 Vitamin D deficiency, unspecified: Secondary | ICD-10-CM

## 2023-10-20 DIAGNOSIS — R269 Unspecified abnormalities of gait and mobility: Secondary | ICD-10-CM

## 2023-10-20 DIAGNOSIS — M479 Spondylosis, unspecified: Secondary | ICD-10-CM | POA: Insufficient documentation

## 2023-10-20 LAB — COMPREHENSIVE METABOLIC PANEL
ALT: 30 U/L (ref 0–35)
AST: 24 U/L (ref 0–37)
Albumin: 4.3 g/dL (ref 3.5–5.2)
Alkaline Phosphatase: 111 U/L (ref 39–117)
BUN: 35 mg/dL — ABNORMAL HIGH (ref 6–23)
CO2: 27 meq/L (ref 19–32)
Calcium: 9.7 mg/dL (ref 8.4–10.5)
Chloride: 101 meq/L (ref 96–112)
Creatinine, Ser: 1.01 mg/dL (ref 0.40–1.20)
GFR: 58.92 mL/min — ABNORMAL LOW (ref >60.00)
Glucose, Bld: 92 mg/dL (ref 70–99)
Potassium: 4.4 meq/L (ref 3.5–5.1)
Sodium: 138 meq/L (ref 135–145)
Total Bilirubin: 0.4 mg/dL (ref 0.2–1.2)
Total Protein: 8 g/dL (ref 6.0–8.3)

## 2023-10-20 LAB — CK: Total CK: 416 U/L — ABNORMAL HIGH (ref 7–177)

## 2023-10-20 LAB — MAGNESIUM: Magnesium: 2.2 mg/dL (ref 1.5–2.5)

## 2023-10-20 LAB — TSH: TSH: 0.6 u[IU]/mL (ref 0.35–5.50)

## 2023-10-20 LAB — VITAMIN D 25 HYDROXY (VIT D DEFICIENCY, FRACTURES): VITD: 23.57 ng/mL — ABNORMAL LOW (ref 30.00–100.00)

## 2023-10-20 MED ORDER — PREDNISONE 20 MG PO TABS: ORAL_TABLET | ORAL | 0 refills | Status: DC

## 2023-10-20 NOTE — Progress Notes (Signed)
 Subjective  CC:  Chief Complaint  Patient presents with   Hypertension   Leg Pain    HPI: Sarah Chung is a 63 y.o. female who presents to the office today to address the problems listed above in the chief complaint. Discussed the use of AI scribe software for clinical note transcription with the patient, who gave verbal consent to proceed.  History of Present Illness   The patient presents with leg cramps and walking difficulties. F/u htn.  I reviewed notes from neurology and cardiology. HTN is now controlled on meds. Home readings normalized.   Gait difficulty worsening: She has been experiencing worsening symptoms in her legs and feet, including difficulty walking and frequent falls. There is a sensation of heaviness in her legs and an inability to feel her feet, leading to tripping and falling. Previously, she walked three miles a day but now struggles to walk down her street due to unsteadiness. No constant muscle soreness, but she reports sharp pain during leg cramps and notes losing feeling in her feet, making it difficult to navigate thresholds. Neurologist notes from December reviewed.  Unclear etiology.  Fluctuating exam.  Cervical spine showed DJD at multiple levels and spinal stenosis.  Patient is without insurance and further imaging studies have not been done.  Nerve conduction studies could possibly be helpful.  Currently seeing the chiropractor first, conservative therapy trial.  A CT scan of her neck revealed cervical disc disease and possible spinal issues. She has been experiencing tingling on her left side and difficulty lifting her legs. A chiropractor noted compressed discs and nerve issues, and she reports that one leg is shorter than the other, possibly due to a fall in July.  She is on a high dose of Lipitor (80 mg) and another statin, and is concerned about potential side effects, including muscle weakness. She has experienced leg cramps, particularly when coughing  or moving in certain ways. She is also on blood pressure medication, which she refers to as heart medication.  She is currently following a treatment plan with a chiropractor and is scheduled to continue until May. Her symptoms are better in the morning and worsen throughout the day.  She has not had MRIs due to lack of insurance but is considering further imaging if necessary. She is waiting for Medicare coverage to begin in November.   Cardiology follow-up was stable with negative carotid Dopplers.  No significant carotid stenosis identified.  She has nonobstructive CAD.  Defer Prevnar until complete physical later this year    She is taking over-the-counter vitamin D.  Has vitamin D deficiency. She is quite distraught, fearful to have more falls.  Has gained weight because she has not been able to exercise.  Assessment  1. Leg cramps   2. Essential hypertension   3. Vitamin D deficiency   4. Gait difficulty   5. Other osteoarthritis of spine, cervical region      Plan  Assessment and Plan    Leg weakness and cramping Worsening leg weakness and cramping with difficulty walking and frequent falls. CT scan showed cervical disc disease and possible spinal narrowing. Considering spinal stenosis or other spinal issues. Statins may contribute but not primary cause. Nerve conduction studies could help but cost is a barrier. - Hold Lipitor for 2-4 weeks to assess impact on symptoms. - Order lab work to check electrolytes and vitamin D levels, CK, liver tests. - Encourage use of a cane for stability. - Consider referral to orthopedist  for further evaluation.  Considers lumbar spinal stenosis as cause. - Discuss potential nerve conduction studies if symptoms persist.  Hypertension Blood pressure within normal range. Well-controlled during recent cardiology visit. - Continue current blood pressure medication regimen. - Encourage regular monitoring of blood pressure once a new monitor is  obtained. -Avoid potassium supplements  Stay hydrated for treatment for leg cramps, start stretching the muscles.  General Health Maintenance Advised to maintain hydration and engage in regular physical activity to improve overall health and manage symptoms. - Encourage hydration and regular physical activity. - Use a cane to prevent falls. - Follow chiropractor's recommendations.       Education regarding management of these chronic disease states was given. Management strategies discussed on successive visits include dietary and exercise recommendations, goals of achieving and maintaining IBW, and lifestyle modifications aiming for adequate sleep and minimizing stressors.   Follow up: Complete physical in November.  Sooner if needed.  Orders Placed This Encounter  Procedures   CK   TSH   VITAMIN D 25 Hydroxy (Vit-D Deficiency, Fractures)   Comprehensive metabolic panel   Magnesium   No orders of the defined types were placed in this encounter.     BP Readings from Last 3 Encounters:  10/20/23 124/72  08/01/23 138/83  07/29/23 124/72   Wt Readings from Last 3 Encounters:  10/20/23 196 lb 12.8 oz (89.3 kg)  08/01/23 190 lb (86.2 kg)  07/29/23 190 lb (86.2 kg)    Lab Results  Component Value Date   CHOL 163 06/23/2022   CHOL 141 06/22/2021   CHOL 177 06/24/2020   Lab Results  Component Value Date   HDL 67 03/11/2023   HDL 61.70 06/23/2022   HDL 58.50 06/22/2021   Lab Results  Component Value Date   LDLCALC 83 06/23/2022   LDLCALC 69 06/22/2021   LDLCALC 99 06/24/2020   Lab Results  Component Value Date   TRIG 71 03/11/2023   TRIG 92.0 06/23/2022   TRIG 65.0 06/22/2021   Lab Results  Component Value Date   CHOLHDL 3 06/23/2022   CHOLHDL 2 06/22/2021   CHOLHDL 2.8 06/24/2020   No results found for: "LDLDIRECT" Lab Results  Component Value Date   CREATININE 0.89 07/05/2023   BUN 27 (H) 07/05/2023   NA 138 07/05/2023   K 4.0 07/05/2023   CL 100  07/05/2023   CO2 30 07/05/2023    The 10-year ASCVD risk score (Arnett DK, et al., 2019) is: 4.9%   Values used to calculate the score:     Age: 38 years     Sex: Female     Is Non-Hispanic African American: No     Diabetic: No     Tobacco smoker: No     Systolic Blood Pressure: 124 mmHg     Is BP treated: Yes     HDL Cholesterol: 67 mg/dL     Total Cholesterol: 149 mg/dL  I reviewed the patients updated PMH, FH, and SocHx.    Patient Active Problem List   Diagnosis Date Noted   Essential hypertension 09/20/2022    Priority: High   Depression, major, single episode, moderate (HCC) 09/01/2022    Priority: High   Chronic anticoagulation 06/22/2021    Priority: High   Seizure disorder (HCC) 08/27/2020    Priority: High   Mixed hyperlipidemia 06/20/2020    Priority: High   Obesity 06/20/2020    Priority: High   Nonobstructive atherosclerosis of coronary artery 04/20/2019  Priority: High   Pulmonary embolism (HCC) x2 02/09/2019    Priority: High   Colon polyps 06/22/2023    Priority: Medium    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    Priority: Medium    Osteopenia 12/17/2022    Priority: Medium    Spinal osteoarthritis 10/20/2023   Vitamin D deficiency 10/20/2023   Leg cramps 10/20/2023   Falls, subsequent encounter 08/01/2023   Gait difficulty 06/06/2023    Allergies: Tegretol [carbamazepine], Ace inhibitors, Keppra [levetiracetam], Ketorolac tromethamine, Sulfa antibiotics, and Lisinopril-hydrochlorothiazide  Social History: Patient  reports that she has never smoked. She has never used smokeless tobacco. She reports that she does not drink alcohol and does not use drugs.  No outpatient medications have been marked as taking for the 10/20/23 encounter (Office Visit) with Willow Ora, MD.    Review of Systems: Cardiovascular: negative for chest pain, palpitations, leg swelling, orthopnea Respiratory: negative for SOB, wheezing or persistent  cough Gastrointestinal: negative for abdominal pain Genitourinary: negative for dysuria or gross hematuria  Objective  Vitals: BP 124/72   Pulse 61   Temp 97.7 F (36.5 C)   Ht 5\' 1"  (1.549 m)   Wt 196 lb 12.8 oz (89.3 kg)   SpO2 99%   BMI 37.19 kg/m  General: no acute distress anxious appearing Psych:  Alert and oriented, normal mood and affect HEENT:  Normocephalic, atraumatic, supple neck  Cardiovascular:  RRR without murmur. no edema Respiratory:  Good breath sounds bilaterally, CTAB with normal respiratory effort Skin:  Warm, no rashes Neurologic:   Mental status is normal Commons side effects, risks, benefits, and alternatives for medications and treatment plan prescribed today were discussed, and the patient expressed understanding of the given instructions. Patient is instructed to call or message via MyChart if he/she has any questions or concerns regarding our treatment plan. No barriers to understanding were identified. We discussed Red Flag symptoms and signs in detail. Patient expressed understanding regarding what to do in case of urgent or emergency type symptoms.  Medication list was reconciled, printed and provided to the patient in AVS. Patient instructions and summary information was reviewed with the patient as documented in the AVS. This note was prepared with assistance of Dragon voice recognition software. Occasional wrong-word or sound-a-like substitutions may have occurred due to the inherent limitation

## 2023-10-20 NOTE — Patient Instructions (Signed)
 Please return in November for complete physical  If you have any questions or concerns, please don't hesitate to send me a message via MyChart or call the office at [phone]. Thank you for visiting with [place] today! It's our pleasure caring for you.   VISIT SUMMARY:  During today's visit, we discussed your worsening leg cramps and walking difficulties. We reviewed your symptoms, including the heaviness in your legs, difficulty feeling your feet, and frequent falls. We also discussed your current medications and potential side effects, as well as your ongoing treatment with a chiropractor. We have outlined a plan to address your symptoms and improve your overall health.  YOUR PLAN:  -LEG WEAKNESS AND CRAMPING: Leg weakness and cramping can be caused by various factors, including nerve issues and medication side effects. We will hold your Lipitor for 2-4 weeks to see if your symptoms improve. We will also check your electrolytes and vitamin D levels with lab work. Using a cane can help with stability, and we may refer you to an orthopedist for further evaluation. If your symptoms persist, we might consider nerve conduction studies.  -HYPERTENSION: Hypertension, or high blood pressure, is when the force of the blood against your artery walls is too high. Your blood pressure is currently well-controlled. Continue taking your current blood pressure medication and regularly monitor your blood pressure once you get a new monitor.  -GENERAL HEALTH MAINTENANCE: Maintaining good hydration and regular physical activity is important for your overall health and can help manage your symptoms. Continue to follow your chiropractor's recommendations and use a cane to prevent falls.  INSTRUCTIONS:  Hold Lipitor for 2-4 weeks and monitor for any changes in your symptoms. Get lab work done to check your electrolytes and vitamin D levels. Use a cane for stability and consider seeing an orthopedist for further  evaluation. If your symptoms persist, we may discuss nerve conduction studies. Continue taking your blood pressure medication and monitor your blood pressure regularly once you obtain a new monitor.

## 2023-10-20 NOTE — Addendum Note (Signed)
 Addended by: Asencion Partridge on: 10/20/2023 02:23 PM   Modules accepted: Orders

## 2023-10-20 NOTE — Progress Notes (Signed)
 Please call patient:labs shoe that her muscle enzyme is very elevated. Please stop the lipitor. Please have lab add on ordered labs that I have ordered today.  Please have her start prednisone to see if this helps improve her symptoms.  Once I get the other labs back, I will give her further instruction.  Also, she needs to take high dose vit D weekly that I have also ordered for her.

## 2023-10-21 ENCOUNTER — Other Ambulatory Visit: Payer: Self-pay

## 2023-10-21 LAB — C-REACTIVE PROTEIN: CRP: <1 mg/dL (ref 0.5–20.0)

## 2023-10-21 NOTE — Addendum Note (Signed)
 Addended by: Lorn Junes on: 10/21/2023 12:33 PM   Modules accepted: Orders

## 2023-10-24 ENCOUNTER — Encounter: Payer: Self-pay | Admitting: Family Medicine

## 2023-10-25 ENCOUNTER — Encounter: Payer: Self-pay | Admitting: Family Medicine

## 2023-10-26 MED ORDER — VITAMIN D (ERGOCALCIFEROL) 1.25 MG (50000 UNIT) PO CAPS: 50000.0000 [IU] | ORAL_CAPSULE | ORAL | 0 refills | Status: AC

## 2023-10-26 NOTE — Telephone Encounter (Signed)
 Please see pt response and advise

## 2023-10-26 NOTE — Telephone Encounter (Signed)
 I ordered it and messaged patient

## 2023-10-27 ENCOUNTER — Ambulatory Visit: Payer: Self-pay | Admitting: Nurse Practitioner

## 2023-10-27 ENCOUNTER — Other Ambulatory Visit (INDEPENDENT_AMBULATORY_CARE_PROVIDER_SITE_OTHER): Payer: Self-pay

## 2023-10-27 DIAGNOSIS — R269 Unspecified abnormalities of gait and mobility: Secondary | ICD-10-CM

## 2023-10-27 DIAGNOSIS — R748 Abnormal levels of other serum enzymes: Secondary | ICD-10-CM

## 2023-10-27 DIAGNOSIS — R252 Cramp and spasm: Secondary | ICD-10-CM

## 2023-10-27 LAB — SEDIMENTATION RATE: Sed Rate: 39 mm/h — ABNORMAL HIGH (ref 0–30)

## 2023-10-28 LAB — LACTATE DEHYDROGENASE: LDH: 155 U/L (ref 120–250)

## 2023-10-28 LAB — ANA W/REFLEX IF POSITIVE: Anti Nuclear Antibody (ANA): NEGATIVE

## 2023-11-02 ENCOUNTER — Encounter: Payer: Self-pay | Admitting: Family Medicine

## 2023-11-04 NOTE — Telephone Encounter (Signed)
 Noted.

## 2023-11-07 ENCOUNTER — Telehealth: Payer: Self-pay | Admitting: Neurology

## 2023-11-07 ENCOUNTER — Encounter (INDEPENDENT_AMBULATORY_CARE_PROVIDER_SITE_OTHER): Payer: Self-pay | Admitting: Neurology

## 2023-11-07 DIAGNOSIS — G40909 Epilepsy, unspecified, not intractable, without status epilepticus: Secondary | ICD-10-CM

## 2023-11-07 DIAGNOSIS — Z5320 Procedure and treatment not carried out because of patient's decision for unspecified reasons: Secondary | ICD-10-CM

## 2023-11-07 NOTE — Telephone Encounter (Signed)
 Pt;s daughter, [name] Read, patient has had 4 Auras. Want to know if need to take her to the Emergency Room. She has these before she has a seizure. Would like a call back ASAP.  Informed daughter if patient has a seizure to call 911. Or take her to the Emergency Room.

## 2023-11-07 NOTE — Telephone Encounter (Signed)
 Pt reached out via [facility] message. The information was sent to the work in physician and will respond after discussing with work in MD.

## 2023-11-08 MED ORDER — PHENOBARBITAL 64.8 MG PO TABS: 139.6000 mg | ORAL_TABLET | Freq: Every day | ORAL | 3 refills | Status: DC

## 2023-11-08 NOTE — Telephone Encounter (Signed)
 I called patient,   She began to have seizure in 1983, had 5-6 generalized seizure over her life time, tried different medications over the past,   She is now taking PB 64.8mg  1.5 tabs at bedtime, seems to work well for her  Last generalized seizure was in 2010, over the years has intermittent " aura", presenting with feeling hot, nauseous feeling, sometimes out of body experience, but only happening occasionally  She is under a lot of stress, had recurrent short aura on  March 28th March 30th, March 31st twice  Denied loss of consciousness, denies out of body experience, feeling hot, nauseous, last for few minutes, denies significant headache, no seizure activity  PB level was 7.4 in Nov 2023,   CT head was normal in Dec 2024,  CT cervical multilevel degenerative changes, left carotid artery atherosclerosis  I have advised her 1.  Higher dose of phenobarbital 64.8 mg 2 tablets every night Meds ordered this encounter  Medications   PHENobarbital (LUMINAL) 64.8 MG tablet    Sig: Take 2 tablets (129.6 mg total) by mouth at bedtime.    Dispense:  180 tablet    Refill:  3  2.  EEG 3 Phenobarbital level 4Document all event 5.  She has not been driving since recurrent episode 6.  She is asking for Xanax as needed, reviewed previous prescription history, Dr. Vickey Huger never prescribe Xanax in the past,  She is on schedule with Dr. Algis Downs in Oct, Please move up her follow up

## 2023-11-08 NOTE — Telephone Encounter (Signed)
Please see the MyChart message reply(ies) for my assessment and plan.    This patient gave consent for this Medical Advice Message and is aware that it may result in a bill to [employer], as well as the possibility of receiving a bill for a co-payment or deductible. They are an established patient, but are not seeking medical advice exclusively about a problem treated during an in person or video visit in the last seven days. I did not recommend an in person or video visit within seven days of my reply.    I spent a total of 17 minutes cumulative time within 7 days through [employer].  Levert Feinstein, MD

## 2023-11-08 NOTE — Telephone Encounter (Signed)
 Pt called back in response to VM yesterday. Pt asking if she is ok to drive. Pt has not had seizure activity in about 16 years. With auras she has had recently she has not had a seizure and reported she has not had an aura in the last 24 hours. Informed Pt since no recent seizure activity should be safe to drive, however if she feels uneasy or is having auras will not need to drive. Pt stated understanding. Also confirmed order for phenobarbitol had been sent to pharmacy on record and we received confirmation of receipt. Pt voiced thankful for our follow up.

## 2023-11-08 NOTE — Telephone Encounter (Signed)
 Called pt and scheduled EEG for 4/3 at 9:30 am. Pt is very anxious about being able to drive. She asked if PHENobarbital (LUMINAL) 64.8 MG tablet [[id number]] was called in to the pharmacy yet, informed pt it was. She asked me to send a message to clinical staff to see if she's allowed to drive. Would like a call back to discuss @ [phone].

## 2023-11-08 NOTE — Telephone Encounter (Signed)
 Spoke w/ Jillian/Angel, they will call pt to schedule EEG

## 2023-11-08 NOTE — Telephone Encounter (Signed)
 Attempted to call Pt, no answer LVM for call back.

## 2023-11-08 NOTE — Telephone Encounter (Signed)
 Spoke w/Pt regarding labs for phenobarbitol level needed in afternoon. Pt stated can come tomorrow afternoon. Placed on lab scheduled for 4/2 at 3:30PM.

## 2023-11-08 NOTE — Addendum Note (Signed)
 Addended by: Levert Feinstein on: 11/08/2023 10:50 AM   Modules accepted: Orders

## 2023-11-09 ENCOUNTER — Other Ambulatory Visit (INDEPENDENT_AMBULATORY_CARE_PROVIDER_SITE_OTHER): Payer: Self-pay

## 2023-11-09 DIAGNOSIS — Z0289 Encounter for other administrative examinations: Secondary | ICD-10-CM

## 2023-11-09 DIAGNOSIS — G40909 Epilepsy, unspecified, not intractable, without status epilepticus: Secondary | ICD-10-CM

## 2023-11-09 DIAGNOSIS — Z5320 Procedure and treatment not carried out because of patient's decision for unspecified reasons: Secondary | ICD-10-CM

## 2023-11-10 ENCOUNTER — Ambulatory Visit (INDEPENDENT_AMBULATORY_CARE_PROVIDER_SITE_OTHER): Payer: Self-pay | Admitting: Neurology

## 2023-11-10 DIAGNOSIS — Z5320 Procedure and treatment not carried out because of patient's decision for unspecified reasons: Secondary | ICD-10-CM

## 2023-11-10 DIAGNOSIS — G40909 Epilepsy, unspecified, not intractable, without status epilepticus: Secondary | ICD-10-CM

## 2023-11-10 LAB — PHENOBARBITAL LEVEL: Phenobarbital, Serum: 10 ug/mL — ABNORMAL LOW (ref 15–40)

## 2023-11-10 NOTE — Procedures (Signed)
   History:  63 year old woman with seizure disorder   EEG classification:  Awake and asleep  Duration: 27 minutes   Technical aspects: This EEG study was done with scalp electrodes positioned according to the 10-20 International system of electrode placement. Electrical activity was reviewed with band pass filter of 1-70Hz , sensitivity of 7 uV/mm, display speed of 70mm/sec with a 60Hz  notched filter applied as appropriate. EEG data were recorded continuously and digitally stored.   Description of the recording: The background rhythms of this recording consists of a fairly well modulated medium amplitude background activity of 9 Hz. As the record progresses, the patient initially is in the waking state, but appears to enter the early stage II sleep during the recording, with rudimentary sleep spindles and vertex sharp wave activity seen. During the wakeful state, photic stimulation was performed, and no abnormal responses were seen. Hyperventilation was also performed, no abnormal response seen. No epileptiform discharges seen during this recording. There was no focal slowing.   Abnormality: None   Impression: This is a normal awake and sleep EEG. No evidence of interictal epileptiform discharges. Normal EEGs, however, do not rule out epilepsy.    Windell Norfolk, MD [facility]

## 2023-11-11 ENCOUNTER — Encounter: Payer: Self-pay | Admitting: Family Medicine

## 2023-11-11 ENCOUNTER — Ambulatory Visit (INDEPENDENT_AMBULATORY_CARE_PROVIDER_SITE_OTHER): Payer: Self-pay | Admitting: Family Medicine

## 2023-11-11 VITALS — BP 133/84 | HR 74 | Temp 98.0°F | Ht 64.0 in | Wt 195.2 lb

## 2023-11-11 DIAGNOSIS — M6281 Muscle weakness (generalized): Secondary | ICD-10-CM

## 2023-11-11 DIAGNOSIS — T466X5A Adverse effect of antihyperlipidemic and antiarteriosclerotic drugs, initial encounter: Secondary | ICD-10-CM

## 2023-11-11 DIAGNOSIS — G720 Drug-induced myopathy: Secondary | ICD-10-CM

## 2023-11-11 DIAGNOSIS — R748 Abnormal levels of other serum enzymes: Secondary | ICD-10-CM

## 2023-11-11 DIAGNOSIS — R269 Unspecified abnormalities of gait and mobility: Secondary | ICD-10-CM

## 2023-11-11 LAB — COMPREHENSIVE METABOLIC PANEL WITH GFR
ALT: 28 U/L (ref 0–35)
AST: 25 U/L (ref 0–37)
Albumin: 4.5 g/dL (ref 3.5–5.2)
Alkaline Phosphatase: 102 U/L (ref 39–117)
BUN: 33 mg/dL — ABNORMAL HIGH (ref 6–23)
CO2: 24 meq/L (ref 19–32)
Calcium: 9.3 mg/dL (ref 8.4–10.5)
Chloride: 101 meq/L (ref 96–112)
Creatinine, Ser: 1.11 mg/dL (ref 0.40–1.20)
GFR: 52.58 mL/min — ABNORMAL LOW (ref >60.00)
Glucose, Bld: 95 mg/dL (ref 70–99)
Potassium: 4.3 meq/L (ref 3.5–5.1)
Sodium: 137 meq/L (ref 135–145)
Total Bilirubin: 0.4 mg/dL (ref 0.2–1.2)
Total Protein: 8.5 g/dL — ABNORMAL HIGH (ref 6.0–8.3)

## 2023-11-11 LAB — CK: Total CK: 269 U/L — ABNORMAL HIGH (ref 7–177)

## 2023-11-11 NOTE — Progress Notes (Signed)
 Subjective  CC:  Chief Complaint  Patient presents with   Follow-up   leg cramp    HPI: Sarah Chung is a 63 y.o. female who presents to the office today to address the problems listed above in the chief complaint. Discussed the use of AI scribe software for clinical note transcription with the patient, who gave verbal consent to proceed.  History of Present Illness   Sarah Chung is a 63 year old female with seizures who presents with leg weakness and unsteadiness. See last notes and neurology eval. Her lab eval revealed elevated CK, nl sed rate, ANA, LDH, LFTs and renal function.   She experiences leg weakness and unsteadiness, particularly when standing for extended periods. There is no change in the heaviness of her legs, and she often relies on holding onto her daughter or others for steadiness. She has difficulty moving her toes, especially on the left side, as the day progresses, and describes an inability to raise her toes, impacting her ability to walk normally. No recent falls, but there is a sense of unsteadiness.  There is significant improvement in sharp pains after discontinuing Lipitor, although occasional muscle cramps in her calf persist, which she considers normal. She is concerned that her symptoms may be related to statin use, which she has discontinued. She notes a loss of muscle mass in her calves and an inability to walk three miles as she used to. She has been attending chiropractic sessions, which have provided some relief but not resolved her symptoms.  She has a history of seizures, with a recent occurrence of four auras over the weekend, which she attributes to stress. She has not had auras in five years and associates them with a low phenobarbital level. Her auras are described as a heat sensation and nausea, leading to an 'outer body experience.' No migraines, but she reports headaches post-seizure.  She has been off cholesterol medication since stopping Lipitor  and is concerned about her cholesterol management. She wants to regain her ability to walk normally and improve her quality of life, mentioning plans to travel to [place] in October.       Assessment  1. Elevated CK   2. Gait difficulty   3. Statin myopathy   4. 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 Persistent leg heaviness and weakness indicate ongoing muscle issues. Statins may take months to clear, with symptoms persisting. - Order nerve conduction studies. - Repeat CK level. - Refer to neurologist for further evaluation.  Mobility impairment Significant walking difficulty due to leg heaviness and unsteadiness. Need to r/o muscle vs nerve related etiology.  - Order nerve conduction studies. May also warrant lumbar MRI but due to cost constraints, will start with EMG/NCV and neuro f/u.  - Provide handicap placard. temporary - Encourage cane use.  Seizure disorder with auras Seizures with auras have recurred after five years. Low phenobarbital level noted. Recurrence may be stress-induced. - Monitor seizure activity and adjust phenobarbital dosage. - Ensure follow-up with neurologist on May 6th.  Cholesterol management Currently not on cholesterol medication. Management to be addressed after resolving current issues. - Reassess cholesterol management after determining cause of current symptoms.        Orders Placed This Encounter  Procedures   CK   Comprehensive metabolic panel with GFR   NCV with EMG(electromyography)   No orders  of the defined types were placed in this encounter.    I reviewed the patients updated PMH, FH, and SocHx.    Patient Active Problem List   Diagnosis Date Noted   Essential hypertension 09/20/2022    Priority: High   Depression, major, single episode, moderate (HCC) 09/01/2022    Priority: High    Chronic anticoagulation 06/22/2021    Priority: High   Seizure disorder (HCC) 08/27/2020    Priority: High   Mixed hyperlipidemia 06/20/2020    Priority: High   Obesity 06/20/2020    Priority: High   Nonobstructive atherosclerosis of coronary artery 04/20/2019    Priority: High   Pulmonary embolism (HCC) x2 02/09/2019    Priority: High   Colon polyps 06/22/2023    Priority: Medium    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    Priority: Medium    Osteopenia 12/17/2022    Priority: Medium    Statin myopathy 11/11/2023   Spinal osteoarthritis 10/20/2023   Vitamin D deficiency 10/20/2023   Leg cramps 10/20/2023   Falls, subsequent encounter 08/01/2023   Gait difficulty 06/06/2023   Current Meds  Medication Sig   olmesartan-hydrochlorothiazide (BENICAR HCT) 40-25 MG tablet Take 1 tablet by mouth daily.   PHENobarbital (LUMINAL) 64.8 MG tablet Take 2 tablets (129.6 mg total) by mouth at bedtime.   Vitamin D, Ergocalciferol, (DRISDOL) 1.25 MG (50000 UNIT) CAPS capsule Take 1 capsule (50,000 Units total) by mouth once a week.   XARELTO 20 MG TABS tablet TAKE 1 TABLET(20 MG) BY MOUTH DAILY WITH SUPPER    Allergies: Patient is allergic to tegretol [carbamazepine], ace inhibitors, keppra [levetiracetam], ketorolac tromethamine, sulfa antibiotics, and lisinopril-hydrochlorothiazide. Family History: Patient family history includes Depression in her sister; [name] disease in her mother; Heart attack in her brother, father, and paternal grandmother; Heart disease in her brother and father; Kidney disease in her maternal grandfather; Mental illness in her mother; Parkinson's disease in her father. Social History:  Patient  reports that she has never smoked. She has never used smokeless tobacco. She reports that she does not drink alcohol and does not use drugs.  Review of Systems: Constitutional: Negative for fever malaise or anorexia Cardiovascular: negative for chest pain Respiratory: negative  for SOB or persistent cough Gastrointestinal: negative for abdominal pain  Objective  Vitals: BP 133/84   Pulse 74   Temp 98 F (36.7 C)   Ht 5\' 4"  (1.626 m)   Wt 195 lb 3.2 oz (88.5 kg)   SpO2 100%   BMI 33.51 kg/m  General: no acute distress , A&Ox3 Abnormal gait Neuro: LE: weakness in foot dorsiflexors and plantar flexion L>r. Otherwise normal strength bilateral legs.   Lab Results  Component Value Date   CKTOTAL 416 (H) 10/20/2023   TROPONINI <0.30 07/01/2013   Lab Results  Component Value Date   TSH 0.60 10/20/2023   Last vitamin D Lab Results  Component Value Date   VD25OH 23.57 (L) 10/20/2023   Lab Results  Component Value Date   ESRSEDRATE 39 (H) 10/27/2023   Lab Results  Component Value Date   ALT 30 10/20/2023   AST 24 10/20/2023   ALKPHOS 111 10/20/2023   BILITOT 0.4 10/20/2023   Lab Results  Component Value Date   NA 138 10/20/2023   CL 101 10/20/2023   K 4.4 10/20/2023   CO2 27 10/20/2023   BUN 35 (H) 10/20/2023   CREATININE 1.01 10/20/2023   GFR 58.92 (L) 10/20/2023   CALCIUM 9.7 10/20/2023   PHOS  4.1 07/02/2013   ALBUMIN 4.3 10/20/2023   GLUCOSE 92 10/20/2023      Commons side effects, risks, benefits, and alternatives for medications and treatment plan prescribed today were discussed, and the patient expressed understanding of the given instructions. Patient is instructed to call or message via MyChart if he/she has any questions or concerns regarding our treatment plan. No barriers to understanding were identified. We discussed Red Flag symptoms and signs in detail. Patient expressed understanding regarding what to do in case of urgent or emergency type symptoms.  Medication list was reconciled, printed and provided to the patient in AVS. Patient instructions and summary information was reviewed with the patient as documented in the AVS. This note was prepared with assistance of Dragon voice recognition software. Occasional wrong-word or  sound-a-like substitutions may have occurred due to the inherent limitations of voice recognition software

## 2023-11-11 NOTE — Patient Instructions (Signed)
 Please follow up if symptoms do not improve or as needed.    I will release your lab results to you on your MyChart account with further instructions. You may see the results before I do, but when I review them I will send you a message with my report or have my assistant call you if things need to be discussed. Please reply to my message with any questions. Thank you!   VISIT SUMMARY:  During your visit, we discussed your leg weakness and unsteadiness, which have been affecting your mobility. We also reviewed your history of seizures and recent auras, as well as your concerns about cholesterol management after discontinuing Lipitor. We have outlined a plan to address these issues and improve your overall health.  YOUR PLAN:  -STATIN-INDUCED MYOPATHY: Statin-induced myopathy is muscle pain or weakness caused by cholesterol-lowering medications called statins. Since you have stopped taking Lipitor, your symptoms have improved, but you still experience leg heaviness and weakness. We will order nerve conduction studies and repeat your CK level to further evaluate your muscle health. Additionally, you will be referred to a neurologist for further evaluation.  -MOBILITY IMPAIRMENT: Mobility impairment refers to difficulty in walking or moving around. Your leg heaviness and unsteadiness are possibly related to statin use, a muscle or nerve issue. We will order nerve conduction studies to investigate this further. In the meantime, we will provide you with a handicap placard and encourage you to use a cane to help with your mobility.  -SEIZURE DISORDER WITH AURAS: A seizure disorder with auras involves episodes of abnormal brain activity that can cause sensations like heat and nausea before a seizure. You have experienced a recurrence of auras, possibly due to stress and low phenobarbital levels. We will monitor your seizure activity and adjust your phenobarbital dosage as needed. Please ensure you follow up  with your neurologist on May 6th.  -CHOLESTEROL MANAGEMENT: Cholesterol management involves keeping your cholesterol levels within a healthy range to prevent heart disease. Since you are currently not on cholesterol medication, we will reassess your cholesterol management after we determine the cause of your current symptoms.  INSTRUCTIONS:  Please follow up with your neurologist on May 6th to discuss your seizure disorder and any necessary adjustments to your medication. We will also conduct nerve conduction studies and repeat your CK level to evaluate your muscle health. In the meantime, use a cane to help with your mobility and use the handicap placard as needed. We will address your cholesterol management once we have more information about your current symptoms.

## 2023-11-12 ENCOUNTER — Encounter: Payer: Self-pay | Admitting: Neurology

## 2023-11-12 ENCOUNTER — Encounter: Payer: Self-pay | Admitting: Family Medicine

## 2023-11-12 NOTE — Progress Notes (Signed)
 See mychart note Dear Ms. Burggraf, Your CK level is improving off of the lipitor. Your other blood test results are stable as well.  Sincerely, Dr. Mardelle Matte

## 2023-11-17 ENCOUNTER — Telehealth: Payer: Self-pay | Admitting: Family Medicine

## 2023-11-17 NOTE — Telephone Encounter (Unsigned)
 Copied from CRM [id number]. Topic: Referral - Request for Referral >> Nov 17, 2023  2:26 PM [place] J wrote: Did the patient discuss referral with their provider in the last year? Yes (If No - schedule appointment) (If Yes - send message)  Appointment offered? No  Type of order/referral and detailed reason for visit: Nerve Conduction Study  Preference of office, provider, location: Unspecified.  If referral order, have you been seen by this specialty before? No (If Yes, this issue or another issue? When? Where?  Can we respond through MyChart? Yes

## 2023-11-24 ENCOUNTER — Telehealth: Payer: Self-pay

## 2023-11-24 ENCOUNTER — Other Ambulatory Visit: Payer: Self-pay

## 2023-11-24 DIAGNOSIS — R748 Abnormal levels of other serum enzymes: Secondary | ICD-10-CM

## 2023-11-24 DIAGNOSIS — R269 Unspecified abnormalities of gait and mobility: Secondary | ICD-10-CM

## 2023-11-24 DIAGNOSIS — M6281 Muscle weakness (generalized): Secondary | ICD-10-CM

## 2023-11-24 NOTE — Telephone Encounter (Signed)
 I spoke with [facility] [place] [facility]. They stated that Dr Jonelle Neri put in an order for an NCV EMG for Sarah Chung and they wanted to see if we would be able to perform that testing. It looks like she is being referred over for NCV EMG testing for Elevated CK, Gait difficulty and Muscle weakness affecting movement of foot - need to r/o muscle vs nerve related etiology. She is also requesting a follow up with neurology. Dr Jonelle Neri is not in the office to change the order to a referral as we do not accept outside orders.  Since this has been mentioned by you in notes in the past, would you be amenable to ordering an NCV EMG for this patient?  Edwina Gram at [facility] is requesting a call back with an update at [phone]

## 2023-11-24 NOTE — Telephone Encounter (Signed)
 I dont' treat neuro muscular disorders and have followed this patient for years for seizures.  We have no NCV EMG technician , can they use the [place]  physiatrists or neurologists for a sooner EMG NCV ?  These accept direct test referrals?   PS : and we have not accepted outside test referrals in years -. CD

## 2023-11-25 ENCOUNTER — Telehealth: Payer: Self-pay | Admitting: Internal Medicine

## 2023-11-25 DIAGNOSIS — E782 Mixed hyperlipidemia: Secondary | ICD-10-CM

## 2023-11-25 DIAGNOSIS — I10 Essential (primary) hypertension: Secondary | ICD-10-CM

## 2023-11-25 DIAGNOSIS — R252 Cramp and spasm: Secondary | ICD-10-CM

## 2023-11-25 NOTE — Telephone Encounter (Signed)
 Called pt to schedule recall she is wanting Sarah Chung to know Dr. Jonelle Neri took her off her statins because she was having muscle cramps, and shooting leg pains. She is wondering If her cholesterol needs to be checked before her appt in June.

## 2023-11-26 NOTE — Telephone Encounter (Signed)
 Check a lipomed panel, CMET and CK before next appt

## 2023-11-28 NOTE — Telephone Encounter (Signed)
 Returned call to patient to inform the need for lab work just before next visit.

## 2023-11-28 NOTE — Telephone Encounter (Signed)
 FYI-Pt called wanting to schedule appt. Pt was informed that we are waiting on an answer from the provider. I explained to her that when we get an answer the nurse will be in touch with her.

## 2023-11-29 ENCOUNTER — Ambulatory Visit: Payer: Self-pay | Admitting: Family Medicine

## 2023-12-14 ENCOUNTER — Encounter: Payer: Self-pay | Admitting: Neurology

## 2023-12-14 ENCOUNTER — Ambulatory Visit (INDEPENDENT_AMBULATORY_CARE_PROVIDER_SITE_OTHER): Payer: Self-pay | Admitting: Neurology

## 2023-12-14 VITALS — BP 139/88 | HR 84 | Ht 64.0 in | Wt 203.0 lb

## 2023-12-14 DIAGNOSIS — R269 Unspecified abnormalities of gait and mobility: Secondary | ICD-10-CM

## 2023-12-14 DIAGNOSIS — W19XXXD Unspecified fall, subsequent encounter: Secondary | ICD-10-CM

## 2023-12-14 DIAGNOSIS — I2782 Chronic pulmonary embolism: Secondary | ICD-10-CM

## 2023-12-14 DIAGNOSIS — M21371 Foot drop, right foot: Secondary | ICD-10-CM

## 2023-12-14 DIAGNOSIS — M21372 Foot drop, left foot: Secondary | ICD-10-CM

## 2023-12-14 MED ORDER — ALPRAZOLAM 1 MG PO TABS: 1.0000 mg | ORAL_TABLET | Freq: Every evening | ORAL | 0 refills | Status: DC | PRN

## 2023-12-14 NOTE — Progress Notes (Signed)
 Provider:  Neomia Banner, MD  Primary Care Physician:  Luevenia Saha, MD [address] [place] [address]     Referring Provider: Luevenia Saha, Md [address] [place],  [place] [address]          Chief Complaint according to patient   Patient presents with:                HISTORY OF PRESENT ILLNESS:  Sarah Chung is a 63 y.o. female patient who is here for revisit 12/14/2023 for  leg weaknes " disconnected legs" no pain.   Rm 1 with friend Loetta Ringer.  Pt is well, reports tingling in her back, no pain.  she is interested in getting NCS/EMG done due to gait difficulty worsening since last visit.   Has fallen  several times. Her L leg is much weaker than her R.  She mentions tingling in her lower back and cramps in abdomen. She gets off balance easy.  She has had a few falls. She can't stand alone for more than 5 mins.     .  Chief concern according to patient :  My foot doesn't move, I want it to move and it doesn't react. I feel as if I am left without control over my legs,  have abdominal  twitching , muscle spasms in the abdominal wall.  Lost calf muscles ( bulk) .  Unsteady when walking.   She was taken off statins by PCP, has CK elevation.      Review of Systems: Out of a complete 14 system review, the patient complains of only the following symptoms, and all other reviewed systems are negative.:      Social History   Socioeconomic History   Marital status: Divorced    Spouse name: [name]   Number of children: 2   Years of education: College   Highest education level: Bachelor's degree (e.g., BA, AB, BS)  Occupational History   Occupation: Musician: [employer]  Tobacco Use   Smoking status: Never   Smokeless tobacco: Never  Vaping Use   Vaping status: Never Used  Substance and Sexual Activity   Alcohol use: No   Drug use: No   Sexual activity: Yes    Birth control/protection: Post-menopausal   Other Topics Concern   Not on file  Social History Narrative   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   Patient has two adult children.   Patient is working full-time.   Patient has a college education.   Patient is right-handed.   Patient drinks two cups of coffee and some soda.   Social Drivers of Corporate investment banker Strain: Low Risk  (10/19/2023)   Overall Financial Resource Strain (CARDIA)    Difficulty of Paying Living Expenses: Not hard at all  Food Insecurity: No Food Insecurity (10/19/2023)   Hunger Vital Sign    Worried About Running Out of Food in the Last Year: Never true    Ran Out of Food in the Last Year: Never true  Transportation Needs: No Transportation Needs (10/19/2023)   PRAPARE - Administrator, Civil Service (Medical): No    Lack of Transportation (Non-Medical): No  Physical Activity: Unknown (10/19/2023)   Exercise Vital Sign    Days of Exercise per Week: Patient declined    Minutes of Exercise per Session:  70 min  Stress: Stress Concern Present (10/19/2023)   [employer] of Occupational Health - Occupational Stress Questionnaire    Feeling of Stress : To some extent  Social Connections: Unknown (10/19/2023)   Social Connection and Isolation Panel [NHANES]    Frequency of Communication with Friends and Family: More than three times a week    Frequency of Social Gatherings with Friends and Family: Three times a week    Attends Religious Services: More than 4 times per year    Active Member of Clubs or Organizations: Patient declined    Attends Banker Meetings: Not on file    Marital Status: Divorced    Family History  Problem Relation Age of Onset   Heart attack Father    Parkinson's disease Father    Heart disease Father    Heart disease Brother    Heart attack Brother    Mental illness Mother    Murrell Arrant' disease Mother    Depression Sister    Kidney disease Maternal Grandfather     Heart attack Paternal Grandmother     Past Medical History:  Diagnosis Date   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   Allergy    Calculus of gallbladder with acute cholecystitis, without mention of obstruction 07/03/2013   Clotting disorder (HCC)    DVT (deep vein thrombosis) in pregnancy    Gallstones    Hyperlipidemia    Pulmonary embolism (HCC)    Seizures (HCC)     Past Surgical History:  Procedure Laterality Date   CHOLECYSTECTOMY N/A 07/02/2013   Procedure: LAPAROSCOPIC CHOLECYSTECTOMY WITH INTRAOPERATIVE CHOLANGIOGRAM;  Surgeon: Levert Ready, MD;  Location: [facility];  Service: General;  Laterality: N/A;   LEFT HEART CATH AND CORONARY ANGIOGRAPHY N/A 04/20/2019   Procedure: LEFT HEART CATH AND CORONARY ANGIOGRAPHY;  Surgeon: Arty Binning, MD;  Location: [facility];  Service: Cardiovascular;  Laterality: N/A;     Current Outpatient Medications on File Prior to Visit  Medication Sig Dispense Refill   olmesartan -hydrochlorothiazide  (BENICAR  HCT) 40-25 MG tablet Take 1 tablet by mouth daily. 90 tablet 3   PHENobarbital  (LUMINAL) 64.8 MG tablet Take 2 tablets (129.6 mg total) by mouth at bedtime. 180 tablet 3   Vitamin D , Ergocalciferol , (DRISDOL ) 1.25 MG (50000 UNIT) CAPS capsule Take 1 capsule (50,000 Units total) by mouth once a week. 12 capsule 0   XARELTO  20 MG TABS tablet TAKE 1 TABLET(20 MG) BY MOUTH DAILY WITH SUPPER 30 tablet 3   ezetimibe  (ZETIA ) 10 MG tablet TAKE 1 TABLET(10 MG) BY MOUTH DAILY (Patient not taking: Reported on 12/14/2023) 90 tablet 3   No current facility-administered medications on file prior to visit.    Allergies  Allergen Reactions   Tegretol [Carbamazepine] Hives   Ace Inhibitors Cough   Keppra [Levetiracetam]     Feels outside her body.Aaron Aasloopy   Ketorolac Tromethamine    Sulfa Antibiotics Hives   Lisinopril -Hydrochlorothiazide  Cough     DIAGNOSTIC DATA (LABS, IMAGING, TESTING) - I reviewed patient records, labs, notes, testing and  imaging myself where available.  Lab Results  Component Value Date   WBC 8.5 06/24/2023   HGB 13.4 06/24/2023   HCT 39.6 06/24/2023   MCV 95.4 06/24/2023   PLT 268.0 06/24/2023      Component Value Date/Time   NA 137 11/11/2023 1135   NA 143 08/27/2021 0928   K 4.3 11/11/2023 1135   CL 101 11/11/2023 1135   CO2 24 11/11/2023 1135   GLUCOSE 95  11/11/2023 1135   BUN 33 (H) 11/11/2023 1135   BUN 14 08/27/2021 0928   CREATININE 1.11 11/11/2023 1135   CREATININE 0.96 06/24/2020 1353   CALCIUM  9.3 11/11/2023 1135   PROT 8.5 (H) 11/11/2023 1135   PROT 8.3 08/27/2021 0928   ALBUMIN 4.5 11/11/2023 1135   ALBUMIN 4.5 08/27/2021 0928   AST 25 11/11/2023 1135   ALT 28 11/11/2023 1135   ALKPHOS 102 11/11/2023 1135   BILITOT 0.4 11/11/2023 1135   BILITOT 0.4 08/27/2021 0928   GFRNONAA >60 08/16/2019 1855   GFRAA >60 08/16/2019 1855   Lab Results  Component Value Date   CHOL 163 06/23/2022   HDL 67 03/11/2023   LDLCALC 83 06/23/2022   TRIG 71 03/11/2023   CHOLHDL 3 06/23/2022   Lab Results  Component Value Date   HGBA1C 5.7 06/24/2023   No results found for: "VITAMINB12" Lab Results  Component Value Date   TSH 0.60 10/20/2023    PHYSICAL EXAM:  Today's Vitals   12/14/23 1439  BP: 139/88  Pulse: 84  Weight: 203 lb (92.1 kg)  Height: 5\' 4"  (1.626 m)   Body mass index is 34.84 kg/m.   Wt Readings from Last 3 Encounters:  12/14/23 203 lb (92.1 kg)  11/11/23 195 lb 3.2 oz (88.5 kg)  10/20/23 196 lb 12.8 oz (89.3 kg)     Ht Readings from Last 3 Encounters:  12/14/23 5\' 4"  (1.626 m)  11/11/23 5\' 4"  (1.626 m)  10/20/23 5\' 4"  (1.626 m)      General: The patient is awake, alert and appears not in acute distress. The patient is well groomed. Head: Normocephalic, atraumatic. Neck is supple. Cardiovascular:  Regular rate and cardiac rhythm by pulse,  without distended neck veins. Respiratory: Lungs are clear to auscultation.  Skin:  Without evidence of ankle edema,  or rash. Trunk:   NEUROLOGIC EXAM: The patient is awake and alert, oriented to place and time.   Memory subjective described as intact.  Attention span & concentration ability appears normal.  Speech is fluent,  without  dysarthria, dysphonia or aphasia.  Mood and affect are appropriate.   Cranial nerves: no loss of smell or taste reported  Pupils are equal and briskly reactive to light. Funduscopic exam defered.  Extraocular movements in vertical and horizontal planes were intact and without nystagmus. No Diplopia. Visual fields by finger perimetry are intact. Hearing was intact to soft voice and finger rubbing.    Facial sensation intact to fine touch.   Facial motor strength is symmetric and tongue and uvula move midline.  Neck ROM : rotation, tilt and flexion extension were normal for age and shoulder shrug was symmetrical.    Motor exam:   unable to lift foot at the ankle or push l down,  the left foot is cold.  Right foot has trace movement. Normal sensation in the feet for pinprick and vibrations.   Sensory:  pinprick and vibration were tested  and normal.  Proprioception tested in the upper extremities was normal.   Coordination: Rapid alternating movements in the fingers/hands were of normal speed.  The Finger-to-nose maneuver was intact without evidence of ataxia, dysmetria or tremor.   Gait and station: Patient could rise  only with assistance ,  hip flexor weakness,  truncal core weakness and foot drop.  Toe and heel walk were deferred.  Deep tendon reflexes: in the  upper and lower extremities are symmetric and Brisk. .      ASSESSMENT  AND PLAN 63 y.o. year old female  here with:  Pt is well, reports she is interested in getting NCS/EMG done due to gait difficulty worsening since last visit. Her L leg is much weaker than her R She mentions tingling in her lower back and cramps in abdomen. She gets off balance easy. She has had a few falls. She can't stand alone for  more than 5 mins.     1) progressive inability to lift feet, left especially and to walk , climb stairs, high risk f of falling.   There is normal sensory : no pain and her feet are cold to touch but neither pale nor bluish   2) quickening sensation in the abdominal wall , no incontinence of stool or urine.   3)  tingling over the lowe back, non-radiating.   Unable to tip toe.   PLAN:   NCV and EMG ASAP-  Brain MRI - with and without contrast .   I plan to follow up either personally or through our NP within 3 months.   I would like to thank Luevenia Saha, MD for allowing me to meet with and to take care of this pleasant patient.    After spending a total time of  35  minutes face to face and additional time for physical and neurologic examination, review of laboratory studies,  personal review of imaging studies, reports and results of other testing and review of referral information / records as far as provided in visit,   Electronically signed by: Neomia Banner, MD 12/14/2023 3:04 PM  [facility] Board certified by The [employer] of Sleep Medicine and Diplomate of the [employer] of Sleep Medicine. Board certified In Neurology through the ABPN, Fellow of the [employer] of Neurology.

## 2023-12-14 NOTE — Addendum Note (Signed)
 Addended by: Neomia Banner on: 12/14/2023 03:43 PM   Modules accepted: Orders

## 2023-12-16 ENCOUNTER — Encounter: Payer: Self-pay | Admitting: Neurology

## 2023-12-21 ENCOUNTER — Telehealth: Payer: Self-pay | Admitting: Internal Medicine

## 2023-12-21 ENCOUNTER — Ambulatory Visit: Payer: Self-pay | Admitting: Family Medicine

## 2023-12-21 ENCOUNTER — Ambulatory Visit: Payer: Self-pay | Admitting: Neurology

## 2023-12-21 ENCOUNTER — Ambulatory Visit: Payer: Self-pay

## 2023-12-21 DIAGNOSIS — W19XXXD Unspecified fall, subsequent encounter: Secondary | ICD-10-CM

## 2023-12-21 DIAGNOSIS — I2782 Chronic pulmonary embolism: Secondary | ICD-10-CM

## 2023-12-21 DIAGNOSIS — M21371 Foot drop, right foot: Secondary | ICD-10-CM

## 2023-12-21 DIAGNOSIS — R269 Unspecified abnormalities of gait and mobility: Secondary | ICD-10-CM

## 2023-12-21 MED ORDER — GADOBENATE DIMEGLUMINE 529 MG/ML IV SOLN
20.0000 mL | Freq: Once | INTRAVENOUS | Status: AC | PRN
  Administered 2023-12-21: 20 mL via INTRAVENOUS

## 2023-12-21 NOTE — Telephone Encounter (Signed)
 Patient notified that labs were at [facility] and could be done just prior to her follow up appointment with provider.

## 2023-12-21 NOTE — Telephone Encounter (Signed)
 Patient calling in about labs to get her cholesterol check. Please advise

## 2023-12-30 ENCOUNTER — Ambulatory Visit (INDEPENDENT_AMBULATORY_CARE_PROVIDER_SITE_OTHER): Payer: Self-pay | Admitting: Neurology

## 2023-12-30 ENCOUNTER — Encounter: Payer: Self-pay | Admitting: Neurology

## 2023-12-30 ENCOUNTER — Telehealth: Payer: Self-pay | Admitting: Neurology

## 2023-12-30 DIAGNOSIS — I2782 Chronic pulmonary embolism: Secondary | ICD-10-CM

## 2023-12-30 DIAGNOSIS — W19XXXD Unspecified fall, subsequent encounter: Secondary | ICD-10-CM

## 2023-12-30 DIAGNOSIS — M21371 Foot drop, right foot: Secondary | ICD-10-CM

## 2023-12-30 DIAGNOSIS — M21372 Foot drop, left foot: Secondary | ICD-10-CM

## 2023-12-30 NOTE — Progress Notes (Signed)
 Chief Complaint  Patient presents with   [place]/EMG    Rm 4, friend.     ASSESSMENT AND PLAN  Sarah Chung is a 63 y.o. 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  But with her brisk patellar reflex, and reported lower abdominal muscle spasm, need to rule out lower thoracic involvement proceed with stat MRI thoracic and lumbar spine Prescription for rolling walker    DIAGNOSTIC DATA (LABS, IMAGING, TESTING) - I reviewed patient records, labs, notes, testing and imaging myself where available.   MEDICAL HISTORY:  Sarah Chung is a 63 year old female, accompanied by her friends, seen in request by Dr. Albertina Hugger for electrodiagnostic evaluation for her gradual worsening gait abnormality, her primary care physician is Dr.   Jonelle Neri, [facility]ard  History is obtained from the patient and review of electronic medical records. I personally reviewed pertinent available imaging films in PACS.   PMHx of  HLD HTN PE in 2020, on Xalreto Seizure since 1987, last seizure was 15 years ago,   She used to be very active, works as a Optician, dispensing, suffered a fall in the summer 2024, fell face down on the concrete floor, since then she began to notice mild gait abnormality, seems to be involving left leg more, after prolonged standing or walking her left leg tends to give out underneath her  She was able to go on a [place] trip in fall 2024, walked 250,000 steps in 6 days, but by December 2024 she noticed significant gait abnormality, continue to progress  She was able to finish first 2 seizure and performance in 2025, but relied on her cane in January, that had to sit down, and rely on her coworker holding  her during most perform season in April 2025  She has a long history of urinary urgency, denies significant worsening, denies upper extremity symptoms  But has lower thoracic spine pain, also intermittent muscle spasm involving lower thoracic region   PHYSICAL EXAM:   Vitals:   12/30/23 0918  BP: 134/84  Pulse: 87  Weight: 203 lb (92.1 kg)  Height: 5\' 4"  (1.626 m)     Body mass index is 34.84 kg/m.  PHYSICAL EXAMNIATION:  Gen: NAD, conversant, well nourised, well groomed                     Cardiovascular: Regular rate rhythm, no peripheral edema, warm, nontender. Eyes: Conjunctivae clear without exudates or hemorrhage Neck: Supple, no carotid bruits. Pulmonary: Clear to auscultation bilaterally   NEUROLOGICAL EXAM:  MENTAL STATUS: Speech/cognition: Awake, alert, oriented to history taking and casual conversation CRANIAL NERVES: CN II: Visual fields are full to confrontation. Pupils are round equal and briskly reactive to light. CN III, IV, VI: extraocular movement are normal. No ptosis. CN V: Facial sensation is intact to light touch CN VII: Face is symmetric with normal eye closure  CN VIII: Hearing is normal to causal conversation. CN IX, X: Phonation is normal. CN XI: Head turning and shoulder shrug are intact  MOTOR: Upper extremity motor strength is normal R/L: Hip flexion 4+/4+, knee flexion5/5, knee extension5/5-, ankle dorsiflexion 3/3-, ankle plantarflexion3/3-  REFLEXES: Reflexes are 2+ and symmetric at the biceps, triceps, knees, and absent at ankles. Plantar responses are mute bilaterally  SENSORY: Well-preserved vibratory sensation light touch  COORDINATION: There is no trunk or limb dysmetria noted.  GAIT/STANCE: Bilateral flailed gait, unsteady  REVIEW OF SYSTEMS:  Full 14 system review of systems performed and notable only for as above All other review of systems were negative.   ALLERGIES: Allergies  Allergen Reactions   Tegretol  [Carbamazepine] Hives   Ace Inhibitors Cough   Keppra [Levetiracetam]     Feels outside her body.Aaron Aasloopy   Ketorolac Tromethamine    Sulfa Antibiotics Hives   Lisinopril -Hydrochlorothiazide  Cough    HOME MEDICATIONS: Current Outpatient Medications  Medication Sig Dispense Refill   olmesartan -hydrochlorothiazide  (BENICAR  HCT) 40-25 MG tablet Take 1 tablet by mouth daily. 90 tablet 3   PHENobarbital  (LUMINAL) 64.8 MG tablet Take 2 tablets (129.6 mg total) by mouth at bedtime. 180 tablet 3   Vitamin D , Ergocalciferol , (DRISDOL ) 1.25 MG (50000 UNIT) CAPS capsule Take 1 capsule (50,000 Units total) by mouth once a week. 12 capsule 0   XARELTO  20 MG TABS tablet TAKE 1 TABLET(20 MG) BY MOUTH DAILY WITH SUPPER 30 tablet 3   ALPRAZolam  (XANAX ) 1 MG tablet Take 1 tablet (1 mg total) by mouth at bedtime as needed. (Patient not taking: Reported on 12/30/2023) 5 tablet 0   ezetimibe  (ZETIA ) 10 MG tablet TAKE 1 TABLET(10 MG) BY MOUTH DAILY (Patient not taking: Reported on 12/30/2023) 90 tablet 3   No current facility-administered medications for this visit.    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   Allergy    Calculus of gallbladder with acute cholecystitis, without mention of obstruction 07/03/2013   Clotting disorder (HCC)    DVT (deep vein thrombosis) in pregnancy    Gallstones    Hyperlipidemia    Pulmonary embolism (HCC)    Seizures (HCC)     PAST SURGICAL HISTORY: Past Surgical History:  Procedure Laterality Date   CHOLECYSTECTOMY N/A 07/02/2013   Procedure: LAPAROSCOPIC CHOLECYSTECTOMY WITH INTRAOPERATIVE CHOLANGIOGRAM;  Surgeon: Levert Ready, MD;  Location: [facility];  Service: General;  Laterality: N/A;   LEFT HEART CATH AND CORONARY ANGIOGRAPHY N/A 04/20/2019   Procedure: LEFT HEART CATH AND CORONARY ANGIOGRAPHY;  Surgeon: Arty Binning, MD;  Location: [facility];  Service: Cardiovascular;  Laterality: N/A;     FAMILY HISTORY: Family History  Problem Relation Age of Onset   Heart attack Father    Parkinson's disease Father    Heart disease Father    Heart disease Brother    Heart attack Brother    Mental illness Mother    Murrell Arrant' disease Mother    Depression Sister    Kidney disease Maternal Grandfather    Heart attack Paternal Grandmother     SOCIAL HISTORY: Social History   Socioeconomic History   Marital status: Divorced    Spouse name: [name]   Number of children: 2   Years of education: College   Highest education level: Bachelor's degree (e.g., BA, AB, BS)  Occupational History   Occupation: Musician: [employer]  Tobacco Use   Smoking status: Never   Smokeless tobacco: Never  Vaping Use   Vaping status: Never Used  Substance and Sexual Activity   Alcohol use: No   Drug use: No   Sexual activity: Yes    Birth control/protection: Post-menopausal  Other Topics Concern   Not on file  Social History Narrative   Patient is married ([name]) and lives at home with his  husband.   Patient has two adult children.   Patient is working full-time.   Patient has a college education.   Patient is right-handed.   Patient drinks two cups of coffee and some soda.   Social Drivers of Corporate investment banker Strain: Low Risk  (10/19/2023)   Overall Financial Resource Strain (CARDIA)    Difficulty of Paying Living Expenses: Not hard at all  Food Insecurity: No Food Insecurity (10/19/2023)   Hunger Vital Sign    Worried About Running Out of Food in the Last Year: Never true    Ran Out of Food in the Last Year: Never true  Transportation Needs: No Transportation Needs (10/19/2023)   PRAPARE - Administrator, Civil Service (Medical): No    Lack of Transportation (Non-Medical): No  Physical Activity: Unknown (10/19/2023)   Exercise Vital Sign    Days of Exercise per Week: Patient declined    Minutes of Exercise per Session: Not on file   Stress: Stress Concern Present (10/19/2023)   [employer] of Occupational Health - Occupational Stress Questionnaire    Feeling of Stress : To some extent  Social Connections: Unknown (10/19/2023)   Social Connection and Isolation Panel [NHANES]    Frequency of Communication with Friends and Family: More than three times a week    Frequency of Social Gatherings with Friends and Family: Three times a week    Attends Religious Services: More than 4 times per year    Active Member of Clubs or Organizations: Patient declined    Attends Banker Meetings: Not on file    Marital Status: Divorced  Intimate Partner Violence: Not on file      Phebe Brasil, M.D. Ph.D.  [facility] [address] [place], [place] [address] Ph: [phone] Fax: [phone]  CC:  Luevenia Saha, MD [address] [place],  [place] [address]  Luevenia Saha, MD

## 2023-12-30 NOTE — Telephone Encounter (Signed)
 I ordered stat MRI thoracic and lumbar, please let her have test soon

## 2023-12-30 NOTE — Procedures (Signed)
 Full Name: Sarah Chung Gender: Female MRN #: [id number] Date of Birth: Jan 05, 1961    Visit Date: 12/30/2023 09:53 Age: 63 Years Examining Physician: Phebe Brasil Referring Physician: Phebe Brasil Height: 5 feet 4 inch History: 63 year old female complains of progressive worsening distal weakness gait abnormality  Summary of the test: Nerve conduction study: Bilateral sural, superficial peroneal sensory responses were normal. Bilateral tibial motor responses were absent. Bilateral peroneal to EDB motor response showed significantly decreased CMAP amplitude  Right median, ulnar motor responses were normal   Electromyography: Selected needle examination was performed at bilateral lower extremity muscles, lumbosacral paraspinal muscles right upper extremity muscles.  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  There is also evidence of active denervation at bilateral lower lumbar paraspinal muscles, most obvious at the left side.   Conclusion: This is an abnormal study.  There is electrodiagnostic evidence of active bilateral lumbosacral radiculopathy, involving bilateral L4-5 S1 myotomes.    ------------------------------- Phebe Brasil. M.D. Ph.D.   [facility] [address] [place], [place] [address] Tel: [phone] Fax: [phone]  Verbal informed consent was obtained from the patient, patient was informed of potential risk of procedure, including bruising, bleeding, hematoma formation, infection, muscle weakness, muscle pain, numbness, among others.        MNC    Nerve / Sites Muscle Latency Ref. Amplitude Ref. Rel Amp Segments Distance Velocity Ref. Area    ms ms mV mV %  cm m/s m/s mVms  R Median - APB     Wrist APB 3.9 <=4.4 6.9 >=4.0 100 Wrist - APB 7   23.7     Upper arm APB 7.5  7.1  104 Upper arm - Wrist 21 58 >=49 22.9  R Ulnar - ADM     Wrist ADM 3.2 <=3.3 9.6 >=6.0 100 Wrist - ADM 7    25.8     B.Elbow ADM 5.1  9.6  99.8 B.Elbow - Wrist 12 65 >=49 24.6     A.Elbow ADM 7.4  8.8  92.5 A.Elbow - B.Elbow 16 68 >=49 25.2  L Peroneal - EDB     Ankle EDB 6.2 <=6.5 0.7 >=2.0 100 Ankle - EDB 9   2.1     Fib head EDB 13.8  0.7  101 Fib head - Ankle 30 39 >=44 2.1     Pop fossa EDB 16.0  0.7  107 Pop fossa - Fib head 9 41 >=44 2.2         Pop fossa - Ankle      R Peroneal - EDB     Ankle EDB 6.6 <=6.5 1.1 >=2.0 100 Ankle - EDB 9   3.5     Fib head EDB 14.4  1.1  94.1 Fib head - Ankle 21 27 >=44 3.6     Pop fossa EDB 17.9  1.0  93.2 Pop fossa - Fib head 11 31 >=44 3.3         Pop fossa - Ankle      L Tibial - AH     Ankle AH NR <=5.8 NR >=4.0 NR Ankle - AH 9   NR     Pop fossa AH      Pop fossa - Ankle   >=41   R Tibial - AH     Ankle AH 7.1 <=5.8 NR  100 Ankle - AH 9   0.8     Pop fossa AH  17.7     21.4 Pop fossa - Ankle 47   >=41                  SNC    Nerve / Sites Rec. Site Peak Lat Ref.  Amp Ref. Segments Distance    ms ms V V  cm  L Sural - Ankle (Calf)     Calf Ankle 3.5 <=4.4 5 >=6 Calf - Ankle 14  R Sural - Ankle (Calf)     Calf Ankle 4.2 <=4.4 13 >=6 Calf - Ankle 14  L Superficial peroneal - Ankle     Lat leg Ankle 3.2 <=4.4 4 >=6 Lat leg - Ankle 14  R Superficial peroneal - Ankle     Lat leg Ankle 3.6 <=4.4 14 >=6 Lat leg - Ankle 14             F  Wave    Nerve F Lat Ref.   ms ms  L Peroneal - EDB  <=56.0  R Tibial - AH NR <=56.0  R Ulnar - ADM 27.7 <=32.0           EMG Summary Table    Spontaneous MUAP Recruitment  Muscle IA Fib PSW Fasc Other Amp Dur. Poly Pattern  R. Tibialis anterior Increased 2+ 2+ None _______ Increased Increased 1+ Reduced  R. Peroneus longus Increased 1+ 2+ None _______ Increased Increased 1+ Reduced  R. Tibialis posterior Increased 1+ 1+ None _______ Increased Increased 1+ Reduced  R. Gastrocnemius (Medial head) Increased 1+ None None _______ Increased Increased 1+ Reduced  R. Vastus lateralis Increased None None None  _______ Increased Increased 1+ Reduced  R. Biceps femoris (short head) Normal None None None _______ Normal Normal Normal Normal  R. Gluteus medius Normal None None None _______ Normal Normal Normal Reduced  R. Lumbar paraspinals (low) Increased 1+ 1+ Rare _______ Normal Normal Normal Normal  R. Lumbar paraspinals (mid) Increased None None None _______ Normal Normal Normal Normal  L. Tibialis anterior Increased 2+ 2+ None _______ Increased Increased 1+ Reduced  L. Tibialis posterior Increased None None None _______ Increased Increased 1+ Reduced  L. Peroneus longus Increased 2+ 2+ None _______ Increased Increased 1+ Reduced  L. Gastrocnemius (Medial head) Increased 1+ 1+ None _______ Increased Increased 1+ Reduced  L. Vastus lateralis Increased None None None _______ Increased Increased 1+ Reduced  L. Lumbar paraspinals (low) Increased 2+ 2+ None _______ Normal Normal Normal Normal  L. Lumbar paraspinals (mid) Increased None None None _______ Normal Normal Normal Normal  R. First dorsal interosseous Normal None None None _______ Normal Normal Normal Normal  R. Biceps brachii Normal None None None _______ Normal Normal Normal Normal  R. Deltoid Normal None None None _______ Normal Normal Normal Normal

## 2024-01-04 ENCOUNTER — Ambulatory Visit: Payer: Self-pay

## 2024-01-04 DIAGNOSIS — M21371 Foot drop, right foot: Secondary | ICD-10-CM

## 2024-01-04 DIAGNOSIS — M21372 Foot drop, left foot: Secondary | ICD-10-CM

## 2024-01-04 DIAGNOSIS — W19XXXD Unspecified fall, subsequent encounter: Secondary | ICD-10-CM

## 2024-01-04 DIAGNOSIS — I2782 Chronic pulmonary embolism: Secondary | ICD-10-CM

## 2024-01-06 ENCOUNTER — Encounter: Payer: Self-pay | Admitting: Neurology

## 2024-01-08 ENCOUNTER — Encounter: Payer: Self-pay | Admitting: Neurology

## 2024-01-09 ENCOUNTER — Ambulatory Visit: Payer: Self-pay | Admitting: Neurology

## 2024-01-09 ENCOUNTER — Telehealth: Payer: Self-pay | Admitting: Neurology

## 2024-01-09 DIAGNOSIS — M6250 Muscle wasting and atrophy, not elsewhere classified, unspecified site: Secondary | ICD-10-CM

## 2024-01-09 DIAGNOSIS — W19XXXD Unspecified fall, subsequent encounter: Secondary | ICD-10-CM

## 2024-01-09 DIAGNOSIS — R269 Unspecified abnormalities of gait and mobility: Secondary | ICD-10-CM

## 2024-01-09 NOTE — Telephone Encounter (Signed)
 I called patient,  MRI of thoracic showed no significant abnormality  MRI of the brain was normal  MRI of lumbar spine showed multilevel degenerative changes, most noticeable at L4-5, severe left, moderate right foraminal narrowing, but there was no evidence of significant canal stenosis  CT cervical spine also showed multilevel degenerative changes  I am not sure about MRI of lumbar findings would explain her profound bilateral distal weakness, active neuropathic changes at bilateral lumbar myotomes, she also has brisk upper and patellar reflex,  Will complete evaluation with MRI of cervical spine,  Bilateral AFO Rx, she benefit from her walker,  Dr. Albertina Hugger also suggested neurosurgical refer,    IMPRESSION: Unremarkable MRI scan of the thoracic spine showing only minor disc and facet degenerative changes without significant compression.    IMPRESSION: MRI scan of the lumbar spine without contrast showing prominent disc and facet degenerative changes throughout most prominent at L4-5 where there is severe left and moderate right-sided foraminal narrowing with possible encroachment of the exiting L5 nerve root on the left.   IMPRESSION: Unremarkable MRI scan of the brain with and without contrast.   CT cervical IMPRESSION: 1. No acute osseous abnormality in the cervical spine. 2. Advanced cervical disc and endplate degeneration at C5-C6 and C6-C7 with possible mild spinal stenosis at both levels. Up to severe bilateral C6, more likely moderate left greater than right C7 nerve level foraminal stenosis. 3. Left cervical carotid atherosclerosis.

## 2024-01-10 ENCOUNTER — Telehealth: Payer: Self-pay | Admitting: Neurology

## 2024-01-10 NOTE — Telephone Encounter (Signed)
 Referral for neurosurgery fax to [facility]. Phone: [phone], Fax: [phone]

## 2024-01-10 NOTE — Addendum Note (Signed)
 Addended by: Neomia Banner on: 01/10/2024 04:10 PM   Modules accepted: Orders

## 2024-01-11 ENCOUNTER — Ambulatory Visit (INDEPENDENT_AMBULATORY_CARE_PROVIDER_SITE_OTHER): Payer: Self-pay

## 2024-01-11 DIAGNOSIS — W19XXXD Unspecified fall, subsequent encounter: Secondary | ICD-10-CM

## 2024-01-11 DIAGNOSIS — R269 Unspecified abnormalities of gait and mobility: Secondary | ICD-10-CM

## 2024-01-11 NOTE — Telephone Encounter (Signed)
 Checked with [facility] and they have sent the order for the pt to [facility]

## 2024-01-12 ENCOUNTER — Ambulatory Visit: Payer: Self-pay | Admitting: Neurology

## 2024-01-13 ENCOUNTER — Other Ambulatory Visit: Payer: Self-pay | Admitting: Neurology

## 2024-01-13 DIAGNOSIS — W19XXXD Unspecified fall, subsequent encounter: Secondary | ICD-10-CM

## 2024-01-13 DIAGNOSIS — M21371 Foot drop, right foot: Secondary | ICD-10-CM

## 2024-01-13 DIAGNOSIS — M6250 Muscle wasting and atrophy, not elsewhere classified, unspecified site: Secondary | ICD-10-CM

## 2024-01-13 DIAGNOSIS — R269 Unspecified abnormalities of gait and mobility: Secondary | ICD-10-CM

## 2024-01-16 ENCOUNTER — Telehealth: Payer: Self-pay | Admitting: Neurology

## 2024-01-16 NOTE — Telephone Encounter (Signed)
 Referral for neurosurgery fax to [facility]. Phone: [phone], Fax: [phone]

## 2024-01-18 MED ORDER — DIAZEPAM 5 MG PO TABS: 5.0000 mg | ORAL_TABLET | Freq: Two times a day (BID) | ORAL | 0 refills | Status: DC

## 2024-01-18 NOTE — Addendum Note (Signed)
 Addended by: Neomia Banner on: 01/18/2024 05:11 PM   Modules accepted: Orders

## 2024-01-18 NOTE — Telephone Encounter (Signed)
 I think we serve you best with a benzo diazepam- its not recommended to be combined with another one ( Xanax ) but its your best shot at lowering spasms especially overnight. You would try to not take xanax  unless in an emergency.  Diazepam for the night 5 mg twice at night allowed po.

## 2024-01-19 LAB — COMPREHENSIVE METABOLIC PANEL WITH GFR
ALT: 26 IU/L (ref 0–32)
AST: 25 IU/L (ref 0–40)
Albumin: 4.3 g/dL (ref 3.9–4.9)
Alkaline Phosphatase: 107 IU/L (ref 44–121)
BUN/Creatinine Ratio: 13 (ref 12–28)
BUN: 13 mg/dL (ref 8–27)
Bilirubin Total: 0.3 mg/dL (ref 0.0–1.2)
CO2: 20 mmol/L (ref 20–29)
Calcium: 9.6 mg/dL (ref 8.7–10.3)
Chloride: 102 mmol/L (ref 96–106)
Creatinine, Ser: 1.02 mg/dL — ABNORMAL HIGH (ref 0.57–1.00)
Globulin, Total: 3.4 g/dL (ref 1.5–4.5)
Glucose: 91 mg/dL (ref 70–99)
Potassium: 4.4 mmol/L (ref 3.5–5.2)
Sodium: 140 mmol/L (ref 134–144)
Total Protein: 7.7 g/dL (ref 6.0–8.5)
eGFR: 61 mL/min/{1.73_m2} (ref >59)

## 2024-01-19 LAB — LIPOPROTEIN A (LPA): Lipoprotein (a): 233.4 nmol/L — ABNORMAL HIGH (ref <75.0)

## 2024-01-19 LAB — APOLIPOPROTEIN B: Apolipoprotein B: 116 mg/dL — ABNORMAL HIGH (ref <90)

## 2024-01-19 LAB — NMR, LIPOPROFILE
Cholesterol, Total: 276 mg/dL — ABNORMAL HIGH (ref 100–199)
HDL Particle Number: 35.5 umol/L (ref >=30.5)
HDL-C: 69 mg/dL (ref >39)
LDL Particle Number: 1461 nmol/L — ABNORMAL HIGH (ref <1000)
LDL Size: 22.6 nm (ref >20.5)
LDL-C (NIH Calc): 189 mg/dL — ABNORMAL HIGH (ref 0–99)
Small LDL Particle Number: 128 nmol/L (ref <=527)
Triglycerides: 104 mg/dL (ref 0–149)

## 2024-01-19 LAB — CK: Total CK: 480 U/L — ABNORMAL HIGH (ref 32–182)

## 2024-01-20 ENCOUNTER — Ambulatory Visit: Payer: Self-pay | Admitting: Internal Medicine

## 2024-01-26 ENCOUNTER — Encounter: Payer: Self-pay | Admitting: Neurology

## 2024-01-26 NOTE — Progress Notes (Signed)
 Cardiology Office Note   Date:  01/27/2024   ID:  [name], [name] August 19, 1960, MRN [id number]  PCP:  Jodie Lavern CROME, MD  Cardiologist:   Vina Gull, MD   Pt presents for follow up of CAD     History of Present Illness: Sarah Chung is a 63 y.o. 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  FFR borderline significance, medical therapy recommended.       I saw the pt in 2024     She was seen by E Monge in Dec 2024    Pt has had significant L pains and weakness  Underwent extensive evaluation   Found to have spinal problems  Scheduled to have neurosurgery on back Part of work up CK found to be elevated    In APril she was taken off of atorvastatin    She is still on Zetia    The pt denies CP   She says her breathing is OK unless walks a long way   She has to put most of her weight on her arms using a walker  since her legs buckle      She says her diet is overall good     Current Meds  Medication Sig   diazepam  (VALIUM ) 5 MG tablet Take 1 tablet (5 mg total) by mouth in the morning and at bedtime.   olmesartan -hydrochlorothiazide  (BENICAR  HCT) 40-25 MG tablet Take 1 tablet by mouth daily.   PHENobarbital  (LUMINAL) 64.8 MG tablet Take 2 tablets (129.6 mg total) by mouth at bedtime.   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   Allergy    Calculus of gallbladder with acute cholecystitis, without mention of obstruction 07/03/2013   Clotting disorder (HCC)    DVT (deep vein thrombosis) in pregnancy    Gallstones    Hyperlipidemia    Pulmonary embolism (HCC)    Seizures (HCC)     Past Surgical History:   Procedure Laterality Date   CHOLECYSTECTOMY N/A 07/02/2013   Procedure: LAPAROSCOPIC CHOLECYSTECTOMY WITH INTRAOPERATIVE CHOLANGIOGRAM;  Surgeon: Elon CHRISTELLA Pacini, MD;  Location: [facility];  Service: General;  Laterality: N/A;   LEFT HEART CATH AND CORONARY ANGIOGRAPHY N/A 04/20/2019   Procedure: LEFT HEART CATH AND CORONARY ANGIOGRAPHY;  Surgeon: Claudene Victory ORN, MD;  Location: [facility];  Service: Cardiovascular;  Laterality: N/A;     Social History:  The patient  reports that she has never smoked. She has never used smokeless tobacco. She reports that she does not drink alcohol and does not use drugs.   Family History:  The patient's family history includes Depression in her sister; Yvone' disease in her mother; Heart attack in her brother, father, and paternal grandmother; Heart disease in her brother and father; Kidney disease in her maternal grandfather; Mental illness in her mother; Parkinson's disease in her father.    ROS:  Please see the history of present illness. All other systems are reviewed and  Negative to the above problem except as noted.    PHYSICAL EXAM: VS:  BP 118/68 (BP Location: Right Arm, Cuff Size: Large)   Ht 5' 4 (1.626  m)   Wt 205 lb (93 kg)   SpO2 96%   BMI 35.19 kg/m   GEN: Obese 63 yo , in no acute distress  HEENT: normal Neck: JVP normal    Cardiac: RRR; no murmurs  No LE  edema Respiratory:  clear to auscultation. GI: soft, nontender,   No hepatomegaly   EKG:  EKG not done today     Cath   04/2019  Mild to moderate LAD, circumflex, and right coronary calcification. 25 to 30% proximal to mid LAD. 40 to 50% eccentric proximal to mid circumflex. 30% mid RCA. Normal LV function.  LVEDP is normal.   RECOMMENDATIONS:   Optimal medical therapy for nonobstructive coronary atherosclerosis: Lifestyle modifications as needed for heart health, aggressive lipid-lowering, assessment of glycemic control, blood pressure control. In context of  clinical symptoms, intervention is not appropriate.  FFR in circumflex is borderline significant.  This should be followed clinically over time with PCI to be considered if symptoms not controllable with medical therapy. Echo 2020    1. The left ventricle has normal systolic function with an ejection  fraction of 60-65%. The cavity size was normal. There is mildly increased  left ventricular wall thickness. Left ventricular diastolic Doppler  parameters are consistent with impaired  relaxation. Indeterminate filling pressures The E/e' is 8-15. No evidence  of left ventricular regional wall motion abnormalities.   2. The mitral valve is grossly normal.   3. The tricuspid valve is grossly normal.   4. The aortic valve is tricuspid. Aortic valve regurgitation is trivial  by color flow Doppler. No stenosis of the aortic valve.   5. The inferior vena cava was dilated in size with <50% respiratory  variability.   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   FINDINGS    Lipid Panel    Component Value Date/Time   CHOL 163 06/23/2022 1022   TRIG 71 03/11/2023 1059   HDL 67 03/11/2023 1059   CHOLHDL 3 06/23/2022 1022   VLDL 18.4 06/23/2022 1022   LDLCALC 83 06/23/2022 1022   LDLCALC 99 06/24/2020 1353      Wt Readings from Last 3 Encounters:  01/27/24 205 lb (93 kg)  12/30/23 203 lb (92.1 kg)  12/14/23 203 lb (92.1 kg)      ASSESSMENT AND PLAN:  1  CAD  Mild by cath in 2020  No symptoms of anigna  2  Preop risk assessment    PT being evaluated for back surgery     From a cardiac standpoint she is at low risk for a major cardiac event and OK to proceed   3  HL  Recent Lipomed panel LDL 189 (up from 68 10 months ago)   THis is off of atorvastatin  but on ezetimide.  CK is still elevated     Would hold furhter Rx changes for now  until after surgery   4  HTN BP is well controlled   Conitnue olmesartan /hydrochlorothiazide   5  Hx DVT/PE  On Xarelto .  Pt notes plan for IVC filter prior to surgery     6  CK elevation.   Noted to be elevated over the past few months even off of atorvastatin    ANA neg   ESR elevated 39  CRP normal  HOld further lipid rx for now  Current medicines are reviewed at length with the patient today.  The patient does not have concerns regarding medicines.  Signed, Vina Gull, MD  01/27/2024 1:57 PM    [facility] [address] [place], [place], [place]  [address] Phone: [phone]; Fax: [phone]

## 2024-01-27 ENCOUNTER — Ambulatory Visit: Payer: Self-pay | Attending: Internal Medicine | Admitting: Internal Medicine

## 2024-01-27 ENCOUNTER — Ambulatory Visit: Payer: Self-pay | Admitting: Internal Medicine

## 2024-01-27 ENCOUNTER — Encounter: Payer: Self-pay | Admitting: Internal Medicine

## 2024-01-27 VITALS — BP 118/68 | Ht 64.0 in | Wt 205.0 lb

## 2024-01-27 DIAGNOSIS — I10 Essential (primary) hypertension: Secondary | ICD-10-CM

## 2024-01-27 NOTE — Patient Instructions (Signed)
 Medication Instructions:  Your physician recommends that you continue on your current medications as directed. Please refer to the Current Medication list given to you today.  *If you need a refill on your cardiac medications before your next appointment, please call your pharmacy*  Lab Work: NONE   If you have labs (blood work) drawn today and your tests are completely normal, you will receive your results only by: MyChart Message (if you have MyChart) OR A paper copy in the mail If you have any lab test that is abnormal or we need to change your treatment, we will call you to review the results.  Testing/Procedures: NONE   Follow-Up: At [facility], you and your health needs are our priority.  As part of our continuing mission to provide you with exceptional heart care, our providers are all part of one team.  This team includes your primary Cardiologist (physician) and Advanced Practice Providers or APPs (Physician Assistants and Nurse Practitioners) who all work together to provide you with the care you need, when you need it.  Your next appointment:    September   Provider:   You may see Ola Berger, MD or one of the following Advanced Practice Providers on your designated Care Team:   Woodfin Hays, PA-C  [place], [place] Theotis Flake, [place]     We recommend signing up for the patient portal called MyChart.  Sign up information is provided on this After Visit Summary.  MyChart is used to connect with patients for Virtual Visits (Telemedicine).  Patients are able to view lab/test results, encounter notes, upcoming appointments, etc.  Non-urgent messages can be sent to your provider as well.   To learn more about what you can do with MyChart, go to [email or web address].   Other Instructions Thank you for choosing  [facility]!

## 2024-01-31 ENCOUNTER — Other Ambulatory Visit: Payer: Self-pay | Admitting: Neurological Surgery

## 2024-02-01 ENCOUNTER — Encounter: Payer: Self-pay | Admitting: Neurology

## 2024-02-01 ENCOUNTER — Other Ambulatory Visit: Payer: Self-pay | Admitting: Neurological Surgery

## 2024-02-01 DIAGNOSIS — M5416 Radiculopathy, lumbar region: Secondary | ICD-10-CM

## 2024-02-02 ENCOUNTER — Ambulatory Visit
Admission: RE | Admit: 2024-02-02 | Discharge: 2024-02-02 | Disposition: A | Discharge status: Home / Self Care | Payer: Self-pay | Source: Ambulatory Visit | Attending: Neurological Surgery | Admitting: Neurological Surgery

## 2024-02-02 DIAGNOSIS — M5416 Radiculopathy, lumbar region: Secondary | ICD-10-CM

## 2024-02-02 HISTORY — PX: IR RADIOLOGIST EVAL & MGMT: IMG5224

## 2024-02-02 NOTE — Consult Note (Signed)
 Chief Complaint: Patient was seen in consultation today for pre operative IVC filter placement  at the request of Joshua Alm Hamilton  Referring Physician(s): Joshua Alm Hamilton  History of Present Illness: Sarah Chung is a 63 y.o. female with history of DVT and pulmonary embolism and takes Xarelto .  Patient is scheduled for lumbar spine surgery in July and will need to stop the Xarelto  for the surgery.  Patient needs pulmonary embolism prophylaxis and will need an IVC filter prior to the surgery.  Patient has not had an IVC filter in the past. Patient's main complaint from her spinal stenosis is weakness.  She is using a walker to ambulate.  Past Medical History:  Diagnosis Date   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   Allergy    Calculus of gallbladder with acute cholecystitis, without mention of obstruction 07/03/2013   Clotting disorder (HCC)    DVT (deep vein thrombosis) in pregnancy    Gallstones    Hyperlipidemia    Pulmonary embolism (HCC)    Seizures (HCC)     Past Surgical History:  Procedure Laterality Date   CHOLECYSTECTOMY N/A 07/02/2013   Procedure: LAPAROSCOPIC CHOLECYSTECTOMY WITH INTRAOPERATIVE CHOLANGIOGRAM;  Surgeon: Elon CHRISTELLA Pacini, MD;  Location: [facility];  Service: General;  Laterality: N/A;   LEFT HEART CATH AND CORONARY ANGIOGRAPHY N/A 04/20/2019   Procedure: LEFT HEART CATH AND CORONARY ANGIOGRAPHY;  Surgeon: Claudene Victory ORN, MD;  Location: [facility];  Service: Cardiovascular;  Laterality: N/A;    Allergies: Tegretol [carbamazepine], Ace inhibitors, Keppra [levetiracetam], Ketorolac tromethamine, Sulfa antibiotics, and Lisinopril -hydrochlorothiazide   Medications: Prior to Admission medications   Medication Sig Start Date End Date Taking? Authorizing Provider  diazepam  (VALIUM ) 5 MG tablet Take 1 tablet (5 mg total) by mouth in the morning and at bedtime. 01/18/24  Yes Dohmeier, Dedra, MD  olmesartan -hydrochlorothiazide  (BENICAR  HCT) 40-25 MG  tablet Take 1 tablet by mouth daily. 08/04/23  Yes Jodie Lavern CROME, MD  PHENobarbital  (LUMINAL) 64.8 MG tablet Take 2 tablets (129.6 mg total) by mouth at bedtime. 11/08/23  Yes Onita Duos, MD  XARELTO  20 MG TABS tablet TAKE 1 TABLET(20 MG) BY MOUTH DAILY WITH SUPPER 05/09/23  Yes Okey Vina GAILS, MD  ALPRAZolam  (XANAX ) 1 MG tablet Take 1 tablet (1 mg total) by mouth at bedtime as needed. Patient not taking: Reported on 02/02/2024 12/14/23   Dohmeier, Dedra, MD  ezetimibe  (ZETIA ) 10 MG tablet TAKE 1 TABLET(10 MG) BY MOUTH DAILY Patient not taking: Reported on 12/30/2023 03/11/23   Okey Vina GAILS, MD     Family History  Problem Relation Age of Onset   Heart attack Father    Parkinson's disease Father    Heart disease Father    Heart disease Brother    Heart attack Brother    Mental illness Mother    Yvone' disease Mother    Depression Sister    Kidney disease Maternal Grandfather    Heart attack Paternal Grandmother     Social History   Socioeconomic History   Marital status: Divorced    Spouse name: [name]   Number of children: 2   Years of education: College   Highest education level: Bachelor's degree (e.g., BA, AB, BS)  Occupational History   Occupation: Musician: [employer]  Tobacco Use   Smoking status: Never   Smokeless tobacco: Never  Vaping Use   Vaping status: Never Used  Substance and Sexual Activity   Alcohol use: No   Drug use:  No   Sexual activity: Yes    Birth control/protection: Post-menopausal  Other Topics Concern   Not on file  Social History Narrative   Patient is married ([name]) and lives at home with his husband.   Patient has two adult children.   Patient is working full-time.   Patient has a college education.   Patient is right-handed.   Patient drinks two cups of coffee and some soda.   Social Drivers of Corporate investment banker Strain: Low Risk  (10/19/2023)   Overall Financial Resource Strain (CARDIA)    Difficulty of  Paying Living Expenses: Not hard at all  Food Insecurity: No Food Insecurity (10/19/2023)   Hunger Vital Sign    Worried About Running Out of Food in the Last Year: Never true    Ran Out of Food in the Last Year: Never true  Transportation Needs: No Transportation Needs (10/19/2023)   PRAPARE - Administrator, Civil Service (Medical): No    Lack of Transportation (Non-Medical): No  Physical Activity: Unknown (10/19/2023)   Exercise Vital Sign    Days of Exercise per Week: Patient declined    Minutes of Exercise per Session: Not on file  Stress: Stress Concern Present (10/19/2023)   [employer] of Occupational Health - Occupational Stress Questionnaire    Feeling of Stress : To some extent  Social Connections: Unknown (10/19/2023)   Social Connection and Isolation Panel    Frequency of Communication with Friends and Family: More than three times a week    Frequency of Social Gatherings with Friends and Family: Three times a week    Attends Religious Services: More than 4 times per year    Active Member of Clubs or Organizations: Patient declined    Attends Banker Meetings: Not on file    Marital Status: Divorced    ECOG Status: 1 - Symptomatic but completely ambulatory   Review of Systems  Neurological:  Positive for weakness.    Vital Signs: BP (!) 164/80 (BP Location: Left Arm, Patient Position: Sitting, Cuff Size: Normal)   Pulse 89   Temp 98.4 F (36.9 C) (Oral)   Resp 18   SpO2 97%     Physical Exam Constitutional:      Appearance: She is not ill-appearing.   Cardiovascular:     Rate and Rhythm: Normal rate.  Pulmonary:     Effort: Pulmonary effort is normal.   Neurological:     Mental Status: She is alert.        Imaging: MR CERVICAL SPINE WO CONTRAST Result Date: 01/11/2024  [facility] [address], [address] [place], [place] [address] [phone] NEUROIMAGING REPORT STUDY DATE: 01/11/2024 PATIENT NAME: Sarah Chung DOB: 1960/11/23 MRN: [id number] ORDERING CLINICIAN: Dr. Onita CLINICAL HISTORY: 64 year old patient being evaluated for gait abnormality COMPARISON FILMS: None EXAM: MRI cervical spine without contrast TECHNIQUE: Sagittal T1, T2, STIR, axial T2 and T1 images were obtained through cervical spine CONTRAST: None IMAGING SITE: [facility] FINDINGS: The cervical vertebrae demonstrate abnormal alignment with loss of forward lordotic curvature and mild posterior subluxation of C3 and C4 over C2 vertebrae.  Vertebral body height and marrow signal characteristics appear normal. C2-3 shows mild disc degenerative change and facet hypertrophy but no significant compression. C3-4 mild disc degenerative change and facet hypertrophy with no significant compression. C4-5 with prominent disc degenerative change along with facet hypertrophy and mild ligamentum flavum hypertrophy resulting in mild posterior canal narrowing and mild  right-sided foraminal narrowing but no definite compression. C5-6 shows prominent disc osteophyte protrusion posterior laterally along with facet hypertrophy resulting in mild canal narrowing and moderate left-sided foraminal narrowing. C6/7 shows prominent disc osteophyte protrusion along with ligamentum flavum hypertrophy and facet degenerative change resulting in mild canal narrowing left-sided foraminal narrowing. Spinal cord parenchyma shows no abnormal signal intensities.  The spinal canal dimensions are adequate throughout.  The visualized portion of the lower brainstem, craniovertebral junction and upper thoracic spine appear unremarkable.  Paraspinal soft tissues show no significant abnormalities.   MRI scan of the cervical spine without contrast showing prominent spondylitic changes at C5-6 and C6/7 with mild canal and moderate left-sided foraminal narrowing. INTERPRETING PHYSICIAN: EATHER POPP, MD Certified in  Neuroimaging by American Society of Neuroimaging and [employer] for  Neurological Subspecialities  MR LUMBAR SPINE WO CONTRAST Result Date: 01/04/2024  [facility] [address], [address] [place], [place] [address] [phone] NEUROIMAGING REPORT STUDY DATE: 01/04/2024 PATIENT NAME: KENDY HASTON DOB: March 11, 1961 MRN: [id number] ORDERING CLINICIAN: Dr. Onita CLINICAL HISTORY: 63 year old patient with foot drop and falls COMPARISON FILMS: None EXAM: MRI lumbar spine without contrast TECHNIQUE: Sagittal T1, T2, STIR and axial T2 and T1 images were obtained through lumbar spine CONTRAST: None IMAGING SITE: [facility] [address] FINDINGS: The lumbar vertebrae demonstrate abnormal alignment with exaggerated forward lordotic curvature but normal body height and marrow signal characteristics.  There are mild disc and facet degenerative changes noted throughout. L1-L2 shows mild disc and prominent facet degenerative changes resulting in mild posterior canal and bilateral foraminal narrowing. L2-3 also shows mild disc and prominent facet degenerative changes resulting in mild bilateral foraminal and moderate posterior canal narrowing. L3-4 also shows prominent disc osteophyte protrusion along with facet hypertrophy resulting in mild bilateral foraminal narrowing. L4-5 shows prominent disc degenerative change and disc osteophyte protrusion laterally to the left along with facet hypertrophy resulting in severe left-sided and moderate right-sided foraminal narrowing with likely impingement of the exiting L5 nerve root on the left. L5-S1 shows mild disc and facet degenerative changes without significant compression. The conus medullaris terminates at L1.  Paraspinal soft tissue appear unremarkable.  Visualized portion of the thoracic spine also show minor disc degenerative changes.   MRI scan of the lumbar spine without contrast showing prominent disc and facet degenerative changes throughout most prominent at L4-5 where there is severe left and moderate right-sided  foraminal narrowing with possible encroachment of the exiting L5 nerve root on the left. INTERPRETING PHYSICIAN: EATHER POPP, MD Certified in  Neuroimaging by American Society of Neuroimaging and [employer] Council for Neurological Subspecialities  MR THORACIC SPINE WO CONTRAST Result Date: 01/04/2024  [facility] [address], [address] [place], [place] [address] [phone] NEUROIMAGING REPORT STUDY DATE: 01/04/2024 PATIENT NAME: CHARITIE HINOTE DOB: [birth year] MRN: [id number] ORDERING CLINICIAN: Dr. Onita CLINICAL HISTORY: 63 year old patient being evaluated for foot drop and falls COMPARISON FILMS: None EXAM: MRI thoracic spine without contrast TECHNIQUE: Sagittal T1, T2, STIR and axial T2 images were obtained through thoracic spine CONTRAST: None IMAGING SITE: [facility] at [address] FINDINGS: The thoracic vertebrae demonstrate normal alignment, body height and marrow signal characteristics except at abnormal marrow signal anteriorly at T2 likely representing benign hemangioma.  There are minor disc signal abnormalities noted throughout but without frank discrimination, cord compression significant root or foraminal encroachment.  Mild facet hypertrophic changes as well as element of hypertrophy noted posteriorly but without significant compression. Spinal canal dimensions are adequate  throughout.  Paraspinal soft tissue appear unremarkable.  Visualized portion of the lower cervical spine appear unremarkable   Unremarkable MRI scan of the thoracic spine showing only minor disc and facet degenerative changes without significant compression. INTERPRETING PHYSICIAN: EATHER POPP, MD Certified in  Neuroimaging by American Society of Neuroimaging and [place] [employer] for Neurological Subspecialities   Labs:  CBC: Recent Labs    03/25/23 0754 06/24/23 1347  WBC 6.3 8.5  HGB 13.3 13.4  HCT 39.9 39.6  PLT 244 268.0    COAGS: No results for input(s): INR, APTT in the last 8760  hours.  BMP: Recent Labs    07/05/23 1052 10/20/23 0938 11/11/23 1135 01/17/24 0753  NA 138 138 137 140  K 4.0 4.4 4.3 4.4  CL 100 101 101 102  CO2 30 27 24 20   GLUCOSE 81 92 95 91  BUN 27* 35* 33* 13  CALCIUM  9.5 9.7 9.3 9.6  CREATININE 0.89 1.01 1.11 1.02*    LIVER FUNCTION TESTS: Recent Labs    06/24/23 1347 10/20/23 0938 11/11/23 1135 01/17/24 0753  BILITOT 0.5 0.4 0.4 0.3  AST 23 24 25 25   ALT 28 30 28 26   ALKPHOS 127* 111 102 107  PROT 8.3 8.0 8.5* 7.7  ALBUMIN 4.5 4.3 4.5 4.3    TUMOR MARKERS: No results for input(s): AFPTM, CEA, CA199, CHROMGRNA in the last 8760 hours.  Assessment and Plan:  63 year old with history of DVT and takes Xarelto .  Patient needs to come off the anticoagulation for lumbar spine surgery in a few weeks.  Patient will need an IVC filter while she is off the anticoagulation.  We discussed IVC filter placement and retrieval.  I showed the patient a video of how the procedure works and what the filter looks like. Both placement and retrieval of the IVC filter can be performed while on Xarelto . We discussed the possible risks with filter placement including bleeding, infection, filter migration and fracture.  Explained that we can remove the filter at any time after she restarts her Xarelto .  Ideally we would like to remove the filter within 3 months of placement but we can wait longer if needed.  Patient has a good understanding the procedure and she is agreeable to undergo preoperative IVC filter placement.  Patient would like to have the procedure done at [facility] and she would like to have it done the week of July 7-11.  Patient is comfortable with having one of my partners do the procedure if I am not available that week.  Thank you for this interesting consult.  I greatly enjoyed meeting Sarah Chung and look forward to participating in their care.  A copy of this report was sent to the requesting provider on this  date.  Electronically Signed: Juliene JONELLE Balder 02/02/2024, 3:40 PM   I spent a total of  15 Minutes   in face to face in clinical consultation, greater than 50% of which was counseling/coordinating care for IVC filter placement.

## 2024-02-03 ENCOUNTER — Other Ambulatory Visit: Payer: Self-pay | Admitting: Neurology

## 2024-02-03 MED ORDER — DIAZEPAM 5 MG PO TABS: 5.0000 mg | ORAL_TABLET | Freq: Two times a day (BID) | ORAL | 0 refills | Status: DC

## 2024-02-07 ENCOUNTER — Other Ambulatory Visit (HOSPITAL_COMMUNITY): Payer: Self-pay | Admitting: Diagnostic Radiology

## 2024-02-07 DIAGNOSIS — Z86718 Personal history of other venous thrombosis and embolism: Secondary | ICD-10-CM

## 2024-02-09 ENCOUNTER — Encounter (HOSPITAL_COMMUNITY): Payer: Self-pay

## 2024-02-09 ENCOUNTER — Other Ambulatory Visit: Payer: Self-pay | Admitting: Student

## 2024-02-09 DIAGNOSIS — I824Y9 Acute embolism and thrombosis of unspecified deep veins of unspecified proximal lower extremity: Secondary | ICD-10-CM

## 2024-02-09 NOTE — H&P (Signed)
 Chief Complaint: DVT/PE on Xarelto   Referring Provider(s): Joshua Alm Hamilton  Supervising Physician: Philip Cornet  Patient Status: [facility] - Out-pt  History of Present Illness: Sarah Chung is a 63 y.o. female  with history of DVT and pulmonary embolism and takes Xarelto .    She is scheduled for lumbar spine surgery in July and will need to stop the Xarelto  for the surgery.   She needs pulmonary embolism prophylaxis and will need an IVC filter prior to the surgery.   She has not had an IVC filter in the past.   She is NPO. No nausea/vomiting. No Fever/chills. ROS negative. Patient is Full Code  Past Medical History:  Diagnosis Date   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   Allergy    Anxiety    Calculus of gallbladder with acute cholecystitis, without mention of obstruction 07/03/2013   Clotting disorder (HCC)    Depression    DVT (deep vein thrombosis) in pregnancy    Gallstones    Hyperlipidemia    Hypertension    Pulmonary embolism (HCC)    Seizures (HCC)     Past Surgical History:  Procedure Laterality Date   CHOLECYSTECTOMY N/A 07/02/2013   Procedure: LAPAROSCOPIC CHOLECYSTECTOMY WITH INTRAOPERATIVE CHOLANGIOGRAM;  Surgeon: Elon CHRISTELLA Pacini, MD;  Location: [facility];  Service: General;  Laterality: N/A;   IR RADIOLOGIST EVAL & MGMT  02/02/2024   LEFT HEART CATH AND CORONARY ANGIOGRAPHY N/A 04/20/2019   Procedure: LEFT HEART CATH AND CORONARY ANGIOGRAPHY;  Surgeon: Claudene Victory ORN, MD;  Location: [facility];  Service: Cardiovascular;  Laterality: N/A;    Allergies: Tegretol [carbamazepine], Ace inhibitors, Iodinated contrast media, Keppra [levetiracetam], Ketorolac tromethamine, Sulfa antibiotics, and Lisinopril -hydrochlorothiazide   Medications: Prior to Admission medications   Medication Sig Start Date End Date Taking? Authorizing Provider  ALPRAZolam  (XANAX ) 1 MG tablet Take 1 tablet (1 mg total) by mouth at bedtime as needed. Patient not taking: Reported on  12/30/2023 12/14/23   Dohmeier, Dedra, MD  diazepam  (VALIUM ) 5 MG tablet Take 1 tablet (5 mg total) by mouth in the morning and at bedtime. Patient taking differently: Take 10 mg by mouth at bedtime as needed for muscle spasms. 02/03/24   Sater, Charlie LABOR, MD  ezetimibe  (ZETIA ) 10 MG tablet TAKE 1 TABLET(10 MG) BY MOUTH DAILY Patient not taking: Reported on 12/14/2023 03/11/23   Okey Vina GAILS, MD  olmesartan -hydrochlorothiazide  (BENICAR  HCT) 40-25 MG tablet Take 1 tablet by mouth daily. 08/04/23   Jodie Lavern CROME, MD  PHENobarbital  (LUMINAL) 64.8 MG tablet Take 2 tablets (129.6 mg total) by mouth at bedtime. 11/08/23   Onita Duos, MD  XARELTO  20 MG TABS tablet TAKE 1 TABLET(20 MG) BY MOUTH DAILY WITH SUPPER 05/09/23   Okey Vina GAILS, MD     Family History  Problem Relation Age of Onset   Heart attack Father    Parkinson's disease Father    Heart disease Father    Heart disease Brother    Heart attack Brother    Mental illness Mother    Yvone' disease Mother    Depression Sister    Kidney disease Maternal Grandfather    Heart attack Paternal Grandmother     Social History   Socioeconomic History   Marital status: Divorced    Spouse name: [name]   Number of children: 2   Years of education: College   Highest education level: Bachelor's degree (e.g., BA, AB, BS)  Occupational History   Occupation: Set designer  Employer: [employer]  Tobacco Use   Smoking status: Never   Smokeless tobacco: Never  Vaping Use   Vaping status: Never Used  Substance and Sexual Activity   Alcohol use: No   Drug use: No   Sexual activity: Yes    Birth control/protection: Post-menopausal  Other Topics Concern   Not on file  Social History Narrative   Patient is married ([name]) and lives at home with his husband.   Patient has two adult children.   Patient is working full-time.   Patient has a college education.   Patient is right-handed.   Patient drinks two cups of coffee and some soda.    Social Drivers of Corporate investment banker Strain: Low Risk  (10/19/2023)   Overall Financial Resource Strain (CARDIA)    Difficulty of Paying Living Expenses: Not hard at all  Food Insecurity: No Food Insecurity (10/19/2023)   Hunger Vital Sign    Worried About Running Out of Food in the Last Year: Never true    Ran Out of Food in the Last Year: Never true  Transportation Needs: No Transportation Needs (10/19/2023)   PRAPARE - Administrator, Civil Service (Medical): No    Lack of Transportation (Non-Medical): No  Physical Activity: Unknown (10/19/2023)   Exercise Vital Sign    Days of Exercise per Week: Patient declined    Minutes of Exercise per Session: Not on file  Stress: Stress Concern Present (10/19/2023)   [employer] of Occupational Health - Occupational Stress Questionnaire    Feeling of Stress : To some extent  Social Connections: Unknown (10/19/2023)   Social Connection and Isolation Panel    Frequency of Communication with Friends and Family: More than three times a week    Frequency of Social Gatherings with Friends and Family: Three times a week    Attends Religious Services: More than 4 times per year    Active Member of Clubs or Organizations: Patient declined    Attends Banker Meetings: Not on file    Marital Status: Divorced     Review of Systems: A 12 point ROS discussed and pertinent positives are indicated in the HPI above.  All other systems are negative.    Vital Signs: BP (!) 121/92   Pulse 73   Temp 97.7 F (36.5 C) (Oral)   Resp 18   Ht 5' 4 (1.626 m)   Wt 206 lb (93.4 kg)   SpO2 96%   BMI 35.36 kg/m   Advance Care Plan: The advanced care place/surrogate decision maker was discussed at the time of visit and the patient did not wish to discuss or was not able to name a surrogate decision maker or provide an advance care plan.  Physical Exam Vitals reviewed.  Constitutional:      Appearance: Normal  appearance.  HENT:     Head: Normocephalic and atraumatic.  Eyes:     Extraocular Movements: Extraocular movements intact.  Cardiovascular:     Rate and Rhythm: Normal rate and regular rhythm.  Pulmonary:     Effort: Pulmonary effort is normal. No respiratory distress.     Breath sounds: Normal breath sounds.  Abdominal:     Palpations: Abdomen is soft.  Musculoskeletal:        General: Normal range of motion.     Cervical back: Normal range of motion.  Skin:    General: Skin is warm and dry.  Neurological:     General:  No focal deficit present.     Mental Status: She is alert and oriented to person, place, and time.  Psychiatric:        Mood and Affect: Mood normal.        Behavior: Behavior normal.        Thought Content: Thought content normal.        Judgment: Judgment normal.     Imaging: IR Radiologist Eval & Mgmt Result Date: 02/02/2024 : See note in Epic. Electronically Signed   By: Juliene Balder M.D.   On: 02/02/2024 15:58    Labs:  CBC: Recent Labs    03/25/23 0754 06/24/23 1347 02/13/24 0826  WBC 6.3 8.5 8.4  HGB 13.3 13.4 12.2  HCT 39.9 39.6 38.4  PLT 244 268.0 284    COAGS: No results for input(s): INR, APTT in the last 8760 hours.  BMP: Recent Labs    11/11/23 1135 01/17/24 0753 02/13/24 0800 02/13/24 0826  NA 137 140 138 138  K 4.3 4.4 3.9 4.0  CL 101 102 101 102  CO2 24 20 24 25   GLUCOSE 95 91 89 97  BUN 33* 13 26* 26*  CALCIUM  9.3 9.6 9.4 9.3  CREATININE 1.11 1.02* 1.09* 1.00  GFRNONAA  --   --  57* >60    LIVER FUNCTION TESTS: Recent Labs    06/24/23 1347 10/20/23 0938 11/11/23 1135 01/17/24 0753  BILITOT 0.5 0.4 0.4 0.3  AST 23 24 25 25   ALT 28 30 28 26   ALKPHOS 127* 111 102 107  PROT 8.3 8.0 8.5* 7.7  ALBUMIN 4.5 4.3 4.5 4.3    TUMOR MARKERS: No results for input(s): AFPTM, CEA, CA199, CHROMGRNA in the last 8760 hours.  Assessment and Plan:  DVT/PE on  Xarelto  - need to stop anticoagulation for lumbar  surgery.  Will proceed with Venography and placement of an IVC filter today by Dr. Balder.  Risks and benefits discussed with the patient including, but not limited to bleeding, infection, contrast induced renal failure, filter fracture or migration which can lead to emergency surgery or even death, strut penetration with damage or irritation to adjacent structures and caval thrombosis.  All of the patient's questions were answered, patient is agreeable to proceed. Consent signed and in chart.  *patient reports nausea with contrast - Zofran  ordered*  Electronically Signed: SARI GORMAN LAMP, PA-C   02/13/2024, 10:27 AM      I spent a total of    15 Minutes in face to face in clinical consultation, greater than 50% of which was counseling/coordinating care for IVC filter.

## 2024-02-09 NOTE — Progress Notes (Signed)
 Surgical Instructions   Your procedure is scheduled on Friday February 24, 2024. Report to [facility] Main Entr[facility] A at [facility], then check in with the Admitting office. Any questions or running late day of surgery: call [phone]  Questions prior to your surgery date: call [phone], Monday-Friday, 8am-4pm. If you experience any cold or flu symptoms such as cough, fever, chills, shortness of breath, etc. between now and your scheduled surgery, please notify us  at the above number.     Remember:  Do not eat after midnight the night before your surgery  You may drink clear liquids until 11:00 the morning of your surgery.   Clear liquids allowed are: Water, Non-Citrus Juices (without pulp), Carbonated Beverages, Clear Tea (no milk, honey, etc.), Black Coffee Only (NO MILK, CREAM OR POWDERED CREAMER of any kind), and Gatorade.    Take these medicines the morning of surgery with A SIP OF WATER : None   One week prior to surgery, STOP taking any Aspirin  (unless otherwise instructed by your surgeon) Aleve, Naproxen, Ibuprofen , Motrin , Advil , Goody's, BC's, all herbal medications, fish oil, and non-prescription vitamins.                     Do NOT Smoke (Tobacco/Vaping) for 24 hours prior to your procedure.  If you use a CPAP at night, you may bring your mask/headgear for your overnight stay.   You will be asked to remove any contacts, glasses, piercing's, hearing aid's, dentures/partials prior to surgery. Please bring cases for these items if needed.    Patients discharged the day of surgery will not be allowed to drive home, and someone needs to stay with them for 24 hours.  SURGICAL WAITING ROOM VISITATION Patients may have no more than 2 support people in the waiting area - these visitors may rotate.   Pre-op nurse will coordinate an appropriate time for 1 ADULT support person, who may not rotate, to accompany patient in pre-op.  Children under the age of 54 must have an adult with  them who is not the patient and must remain in the main waiting area with an adult.  If the patient needs to stay at the hospital during part of their recovery, the visitor guidelines for inpatient rooms apply.  Please refer to the [facility] website for the visitor guidelines for any additional information.   If you received a COVID test during your pre-op visit  it is requested that you wear a mask when out in public, stay away from anyone that may not be feeling well and notify your surgeon if you develop symptoms. If you have been in contact with anyone that has tested positive in the last 10 days please notify you surgeon.      Pre-operative 5 CHG Bathing Instructions   You can play a key role in reducing the risk of infection after surgery. Your skin needs to be as free of germs as possible. You can reduce the number of germs on your skin by washing with CHG (chlorhexidine gluconate) soap before surgery. CHG is an antiseptic soap that kills germs and continues to kill germs even after washing.   DO NOT use if you have an allergy to chlorhexidine/CHG or antibacterial soaps. If your skin becomes reddened or irritated, stop using the CHG and notify one of our RNs at [phone].   Please shower with the CHG soap starting 4 days before surgery using the following schedule:     Please keep in mind  the following:  DO NOT shave, including legs and underarms, starting the day of your first shower.   Place clean sheets on your bed the day you start using CHG soap. Use a clean washcloth (not used since being washed) for each shower. DO NOT sleep with pets once you start using the CHG.   CHG Shower Instructions:  Wash your face and private area with normal soap. If you choose to wash your hair, wash first with your normal shampoo.  After you use shampoo/soap, rinse your hair and body thoroughly to remove shampoo/soap residue.  Turn the water OFF and apply about 3 tablespoons (45 ml) of  CHG soap to a CLEAN washcloth.  Apply CHG soap ONLY FROM YOUR NECK DOWN TO YOUR TOES (washing for 3-5 minutes)  DO NOT use CHG soap on face, private areas, open wounds, or sores.  Pay special attention to the area where your surgery is being performed.  If you are having back surgery, having someone wash your back for you may be helpful. Wait 2 minutes after CHG soap is applied, then you may rinse off the CHG soap.  Pat dry with a clean towel  Put on clean clothes/pajamas   If you choose to wear lotion, please use ONLY the CHG-compatible lotions that are listed below.  Additional instructions for the day of surgery: DO NOT APPLY any lotions, deodorants or perfumes.   Do not bring valuables to the hospital. [facility] is not responsible for any belongings/valuables. Do not wear nail polish, gel polish, artificial nails, or any other type of covering on natural nails (fingers and toes) Do not wear jewelry or makeup Put on clean/comfortable clothes.  Please brush your teeth.  Ask your nurse before applying any prescription medications to the skin.     CHG Compatible Lotions   Aveeno Moisturizing lotion  Cetaphil Moisturizing Cream  Cetaphil Moisturizing Lotion  Clairol Herbal Essence Moisturizing Lotion, Dry Skin  Clairol Herbal Essence Moisturizing Lotion, Extra Dry Skin  Clairol Herbal Essence Moisturizing Lotion, Normal Skin  Curel Age Defying Therapeutic Moisturizing Lotion with Alpha Hydroxy  Curel Extreme Care Body Lotion  Curel Soothing Hands Moisturizing Hand Lotion  Curel Therapeutic Moisturizing Cream, Fragrance-Free  Curel Therapeutic Moisturizing Lotion, Fragrance-Free  Curel Therapeutic Moisturizing Lotion, Original Formula  Eucerin Daily Replenishing Lotion  Eucerin Dry Skin Therapy Plus Alpha Hydroxy Crme  Eucerin Dry Skin Therapy Plus Alpha Hydroxy Lotion  Eucerin Original Crme  Eucerin Original Lotion  Eucerin Plus Crme Eucerin Plus Lotion  Eucerin TriLipid  Replenishing Lotion  Keri Anti-Bacterial Hand Lotion  Keri Deep Conditioning Original Lotion Dry Skin Formula Softly Scented  Keri Deep Conditioning Original Lotion, Fragrance Free Sensitive Skin Formula  Keri Lotion Fast Absorbing Fragrance Free Sensitive Skin Formula  Keri Lotion Fast Absorbing Softly Scented Dry Skin Formula  Keri Original Lotion  Keri Skin Renewal Lotion Keri Silky Smooth Lotion  Keri Silky Smooth Sensitive Skin Lotion  Nivea Body Creamy Conditioning Oil  Nivea Body Extra Enriched Lotion  Nivea Body Original Lotion  Nivea Body Sheer Moisturizing Lotion Nivea Crme  Nivea Skin Firming Lotion  NutraDerm 30 Skin Lotion  NutraDerm Skin Lotion  NutraDerm Therapeutic Skin Cream  NutraDerm Therapeutic Skin Lotion  ProShield Protective Hand Cream  Provon moisturizing lotion  Please read over the following fact sheets that you were given.

## 2024-02-12 ENCOUNTER — Other Ambulatory Visit: Payer: Self-pay | Admitting: Student

## 2024-02-13 ENCOUNTER — Encounter (HOSPITAL_COMMUNITY)
Admission: RE | Admit: 2024-02-13 | Discharge: 2024-02-13 | Disposition: A | Discharge status: Home / Self Care | Payer: Self-pay | Source: Ambulatory Visit | Attending: Neurological Surgery

## 2024-02-13 ENCOUNTER — Encounter (HOSPITAL_COMMUNITY): Payer: Self-pay

## 2024-02-13 ENCOUNTER — Other Ambulatory Visit: Payer: Self-pay

## 2024-02-13 ENCOUNTER — Ambulatory Visit (HOSPITAL_COMMUNITY)
Admission: RE | Admit: 2024-02-13 | Discharge: 2024-02-13 | Disposition: A | Discharge status: Home / Self Care | Payer: Self-pay | Source: Ambulatory Visit | Attending: Diagnostic Radiology | Admitting: Diagnostic Radiology

## 2024-02-13 VITALS — BP 124/78 | HR 76 | Temp 98.2°F | Resp 17 | Ht 64.0 in | Wt 206.3 lb

## 2024-02-13 VITALS — BP 129/66 | HR 75 | Temp 97.7°F | Resp 16 | Ht 64.0 in | Wt 206.0 lb

## 2024-02-13 DIAGNOSIS — Z86711 Personal history of pulmonary embolism: Secondary | ICD-10-CM | POA: Insufficient documentation

## 2024-02-13 DIAGNOSIS — Z01818 Encounter for other preprocedural examination: Secondary | ICD-10-CM

## 2024-02-13 DIAGNOSIS — Z7901 Long term (current) use of anticoagulants: Secondary | ICD-10-CM | POA: Insufficient documentation

## 2024-02-13 DIAGNOSIS — I2699 Other pulmonary embolism without acute cor pulmonale: Secondary | ICD-10-CM

## 2024-02-13 DIAGNOSIS — Z86718 Personal history of other venous thrombosis and embolism: Secondary | ICD-10-CM | POA: Insufficient documentation

## 2024-02-13 DIAGNOSIS — G40909 Epilepsy, unspecified, not intractable, without status epilepticus: Secondary | ICD-10-CM

## 2024-02-13 DIAGNOSIS — I10 Essential (primary) hypertension: Secondary | ICD-10-CM

## 2024-02-13 DIAGNOSIS — Z408 Encounter for other prophylactic surgery: Secondary | ICD-10-CM | POA: Insufficient documentation

## 2024-02-13 HISTORY — DX: Atherosclerotic heart disease of native coronary artery without angina pectoris: I25.10

## 2024-02-13 HISTORY — DX: Acute embolism and thrombosis of unspecified deep veins of unspecified lower extremity: I82.409

## 2024-02-13 HISTORY — PX: IR IVC FILTER PLMT / S&I /IMG GUID/MOD SED: IMG701

## 2024-02-13 HISTORY — DX: Anxiety disorder, unspecified: F41.9

## 2024-02-13 HISTORY — DX: Depression, unspecified: F32.A

## 2024-02-13 HISTORY — DX: Essential (primary) hypertension: I10

## 2024-02-13 LAB — CBC
HCT: 38.4 % (ref 36.0–46.0)
Hemoglobin: 12.2 g/dL (ref 12.0–15.0)
MCH: 30.7 pg (ref 26.0–34.0)
MCHC: 31.8 g/dL (ref 30.0–36.0)
MCV: 96.7 fL (ref 80.0–100.0)
Platelets: 284 K/uL (ref 150–400)
RBC: 3.97 MIL/uL (ref 3.87–5.11)
RDW: 13.1 % (ref 11.5–15.5)
WBC: 8.4 K/uL (ref 4.0–10.5)
nRBC: 0 % (ref 0.0–0.2)

## 2024-02-13 LAB — BASIC METABOLIC PANEL WITH GFR
Anion gap: 11 (ref 5–15)
Anion gap: 13 (ref 5–15)
BUN: 26 mg/dL — ABNORMAL HIGH (ref 8–23)
BUN: 26 mg/dL — ABNORMAL HIGH (ref 8–23)
CO2: 25 mmol/L (ref 22–32)
CO2: 24 mmol/L (ref 22–32)
Calcium: 9.3 mg/dL (ref 8.9–10.3)
Calcium: 9.4 mg/dL (ref 8.9–10.3)
Chloride: 102 mmol/L (ref 98–111)
Chloride: 101 mmol/L (ref 98–111)
Creatinine, Ser: 1 mg/dL (ref 0.44–1.00)
Creatinine, Ser: 1.09 mg/dL — ABNORMAL HIGH (ref 0.44–1.00)
GFR, Estimated: >60 mL/min (ref >60)
GFR, Estimated: 57 mL/min — ABNORMAL LOW (ref >60)
Glucose, Bld: 97 mg/dL (ref 70–99)
Glucose, Bld: 89 mg/dL (ref 70–99)
Potassium: 4 mmol/L (ref 3.5–5.1)
Potassium: 3.9 mmol/L (ref 3.5–5.1)
Sodium: 138 mmol/L (ref 135–145)
Sodium: 138 mmol/L (ref 135–145)

## 2024-02-13 LAB — SURGICAL PCR SCREEN
MRSA, PCR: NEGATIVE
Staphylococcus aureus: NEGATIVE

## 2024-02-13 MED ORDER — ONDANSETRON HCL 4 MG/2ML IJ SOLN
INTRAMUSCULAR | Status: AC
  Filled 2024-02-13: qty 2

## 2024-02-13 MED ORDER — LIDOCAINE HCL 1 % IJ SOLN
INTRAMUSCULAR | Status: AC
  Filled 2024-02-13: qty 20

## 2024-02-13 MED ORDER — MIDAZOLAM HCL 2 MG/2ML IJ SOLN
INTRAMUSCULAR | Status: AC
  Filled 2024-02-13: qty 2

## 2024-02-13 MED ORDER — ONDANSETRON HCL 4 MG/2ML IJ SOLN: 4.0000 mg | Freq: Once | INTRAMUSCULAR | Status: DC

## 2024-02-13 MED ORDER — ONDANSETRON HCL 4 MG/2ML IJ SOLN
INTRAMUSCULAR | Status: AC | PRN
  Administered 2024-02-13: 4 mg via INTRAVENOUS

## 2024-02-13 MED ORDER — MIDAZOLAM HCL 2 MG/2ML IJ SOLN
INTRAMUSCULAR | Status: AC | PRN
  Administered 2024-02-13 (×3): 1 mg via INTRAVENOUS

## 2024-02-13 MED ORDER — FENTANYL CITRATE (PF) 100 MCG/2ML IJ SOLN
INTRAMUSCULAR | Status: AC
  Filled 2024-02-13: qty 2

## 2024-02-13 MED ORDER — LIDOCAINE HCL 1 % IJ SOLN
20.0000 mL | Freq: Once | INTRAMUSCULAR | Status: AC
  Administered 2024-02-13: 10 mL via INTRADERMAL

## 2024-02-13 MED ORDER — FENTANYL CITRATE (PF) 100 MCG/2ML IJ SOLN
INTRAMUSCULAR | Status: AC | PRN
  Administered 2024-02-13 (×2): 50 ug via INTRAVENOUS

## 2024-02-13 MED ORDER — IOHEXOL 300 MG/ML  SOLN
100.0000 mL | Freq: Once | INTRAMUSCULAR | Status: AC | PRN
  Administered 2024-02-13: 30 mL via INTRAVENOUS

## 2024-02-13 MED ORDER — HYDROCODONE-ACETAMINOPHEN 5-325 MG PO TABS: 1.0000 | ORAL_TABLET | ORAL | Status: DC | PRN

## 2024-02-13 MED ORDER — IOHEXOL 300 MG/ML  SOLN
50.0000 mL | Freq: Once | INTRAMUSCULAR | Status: AC | PRN
  Administered 2024-02-13: 15 mL via INTRAVENOUS

## 2024-02-13 NOTE — Progress Notes (Signed)
 Patient was given discharge instructions. She verbalized understanding.

## 2024-02-13 NOTE — Progress Notes (Signed)
 PCP - Dr. Lavern Heck Cardiologist - Dr. Vina Gull  PPM/ICD - denies Device Orders -  Rep Notified -   Chest x-ray - n/a EKG - 07/29/2023 Stress Test - denies ECHO - 02/09/2019 Cardiac Cath - 04/20/2019  Sleep Study - denies CPAP -   Fasting Blood Sugar - n/a Checks Blood Sugar _____ times a day  Last dose of GLP1 agonist-  n/a GLP1 instructions:   Blood Thinner Instructions: Stop Xarelto  5 days prior to surgery per MD. Having an IVC filter placed today in Radiology.   ERAS Protcol - clears until 11am PRE-SURGERY Ensure or G2- n/a  COVID TEST- no   Anesthesia review: Yes, Hx PE, seizures, HTN  Patient denies shortness of breath, fever, cough and chest pain at PAT appointment   All instructions explained to the patient, with a verbal understanding of the material. Patient agrees to go over the instructions while at home for a better understanding.

## 2024-02-13 NOTE — Progress Notes (Signed)
 Oneil, RN with radiology and [facility] notified of client ate at 0500; no new orders

## 2024-02-13 NOTE — Progress Notes (Signed)
 Surgical Instructions   Your procedure is scheduled on Friday February 24, 2024. Report to [facility] Main Entrance A at [facility], then check in with the Admitting office. Any questions or running late day of surgery: call [phone]  Questions prior to your surgery date: call [phone], Monday-Friday, 8am-4pm. If you experience any cold or flu symptoms such as cough, fever, chills, shortness of breath, etc. between now and your scheduled surgery, please notify us  at the above number.     Remember:  Do not eat after midnight the night before your surgery  You may drink clear liquids until 11:00 am the morning of your surgery.   Clear liquids allowed are: Water, Non-Citrus Juices (without pulp), Carbonated Beverages, Clear Tea (no milk, honey, etc.), Black Coffee Only (NO MILK, CREAM OR POWDERED CREAMER of any kind), and Gatorade.    Take these medicines the morning of surgery with A SIP OF WATER : None  Follow your Doctor's instructions when to STOP your Xarelto .   One week prior to surgery, STOP taking any Aspirin  (unless otherwise instructed by your surgeon) Aleve, Naproxen, Ibuprofen , Motrin , Advil , Goody's, BC's, all herbal medications, fish oil, and non-prescription vitamins.                     Do NOT Smoke (Tobacco/Vaping) for 24 hours prior to your procedure.  If you use a CPAP at night, you may bring your mask/headgear for your overnight stay.   You will be asked to remove any contacts, glasses, piercing's, hearing aid's, dentures/partials prior to surgery. Please bring cases for these items if needed.    Patients discharged the day of surgery will not be allowed to drive home, and someone needs to stay with them for 24 hours.  SURGICAL WAITING ROOM VISITATION Patients may have no more than 2 support people in the waiting area - these visitors may rotate.   Pre-op nurse will coordinate an appropriate time for 1 ADULT support person, who may not rotate, to accompany patient  in pre-op.  Children under the age of 80 must have an adult with them who is not the patient and must remain in the main waiting area with an adult.  If the patient needs to stay at the hospital during part of their recovery, the visitor guidelines for inpatient rooms apply.  Please refer to the [facility] website for the visitor guidelines for any additional information.   If you received a COVID test during your pre-op visit  it is requested that you wear a mask when out in public, stay away from anyone that may not be feeling well and notify your surgeon if you develop symptoms. If you have been in contact with anyone that has tested positive in the last 10 days please notify you surgeon.      Pre-operative 5 CHG Bathing Instructions   You can play a key role in reducing the risk of infection after surgery. Your skin needs to be as free of germs as possible. You can reduce the number of germs on your skin by washing with CHG (chlorhexidine  gluconate) soap before surgery. CHG is an antiseptic soap that kills germs and continues to kill germs even after washing.   DO NOT use if you have an allergy to chlorhexidine /CHG or antibacterial soaps. If your skin becomes reddened or irritated, stop using the CHG and notify one of our RNs at [phone].   Please shower with the CHG soap starting 4 days before surgery using  the following schedule:     Please keep in mind the following:  DO NOT shave, including legs and underarms, starting the day of your first shower.   Place clean sheets on your bed the day you start using CHG soap. Use a clean washcloth (not used since being washed) for each shower. DO NOT sleep with pets once you start using the CHG.   CHG Shower Instructions:  Wash your face and private area with normal soap. If you choose to wash your hair, wash first with your normal shampoo.  After you use shampoo/soap, rinse your hair and body thoroughly to remove shampoo/soap  residue.  Turn the water OFF and apply about 3 tablespoons (45 ml) of CHG soap to a CLEAN washcloth.  Apply CHG soap ONLY FROM YOUR NECK DOWN TO YOUR TOES (washing for 3-5 minutes)  DO NOT use CHG soap on face, private areas, open wounds, or sores.  Pay special attention to the area where your surgery is being performed.  If you are having back surgery, having someone wash your back for you may be helpful. Wait 2 minutes after CHG soap is applied, then you may rinse off the CHG soap.  Pat dry with a clean towel  Put on clean clothes/pajamas   If you choose to wear lotion, please use ONLY the CHG-compatible lotions that are listed below.  Additional instructions for the day of surgery: DO NOT APPLY any lotions, deodorants or perfumes.   Do not bring valuables to the hospital. [facility] is not responsible for any belongings/valuables. Do not wear nail polish, gel polish, artificial nails, or any other type of covering on natural nails (fingers and toes) Do not wear jewelry or makeup Put on clean/comfortable clothes.  Please brush your teeth.  Ask your nurse before applying any prescription medications to the skin.     CHG Compatible Lotions   Aveeno Moisturizing lotion  Cetaphil Moisturizing Cream  Cetaphil Moisturizing Lotion  Clairol Herbal Essence Moisturizing Lotion, Dry Skin  Clairol Herbal Essence Moisturizing Lotion, Extra Dry Skin  Clairol Herbal Essence Moisturizing Lotion, Normal Skin  Curel Age Defying Therapeutic Moisturizing Lotion with Alpha Hydroxy  Curel Extreme Care Body Lotion  Curel Soothing Hands Moisturizing Hand Lotion  Curel Therapeutic Moisturizing Cream, Fragrance-Free  Curel Therapeutic Moisturizing Lotion, Fragrance-Free  Curel Therapeutic Moisturizing Lotion, Original Formula  Eucerin Daily Replenishing Lotion  Eucerin Dry Skin Therapy Plus Alpha Hydroxy Crme  Eucerin Dry Skin Therapy Plus Alpha Hydroxy Lotion  Eucerin Original Crme  Eucerin  Original Lotion  Eucerin Plus Crme Eucerin Plus Lotion  Eucerin TriLipid Replenishing Lotion  Keri Anti-Bacterial Hand Lotion  Keri Deep Conditioning Original Lotion Dry Skin Formula Softly Scented  Keri Deep Conditioning Original Lotion, Fragrance Free Sensitive Skin Formula  Keri Lotion Fast Absorbing Fragrance Free Sensitive Skin Formula  Keri Lotion Fast Absorbing Softly Scented Dry Skin Formula  Keri Original Lotion  Keri Skin Renewal Lotion Keri Silky Smooth Lotion  Keri Silky Smooth Sensitive Skin Lotion  Nivea Body Creamy Conditioning Oil  Nivea Body Extra Enriched Lotion  Nivea Body Original Lotion  Nivea Body Sheer Moisturizing Lotion Nivea Crme  Nivea Skin Firming Lotion  NutraDerm 30 Skin Lotion  NutraDerm Skin Lotion  NutraDerm Therapeutic Skin Cream  NutraDerm Therapeutic Skin Lotion  ProShield Protective Hand Cream  Provon moisturizing lotion  Please read over the following fact sheets that you were given.

## 2024-02-13 NOTE — Consult Note (Signed)
 Interventional Radiology Procedure:   Indications: Pre-op PE prophylaxis, history of DVT and PE  Procedure: IVC filter placement  Findings: Denali filter placed below renal veins.   Complications: None     EBL: Minimal  Plan: Discharge to home in 2 hours. Plan for filter retrieval after surgery.  Dominic Mahaney R. Philip, MD  Pager: [phone]

## 2024-02-14 NOTE — Progress Notes (Signed)
 Anesthesia Chart Review:  Case: [id number] Date/Time: 02/24/24 1345   Procedure: LUMBAR LAMINECTOMY/DECOMPRESSION MICRODISCECTOMY 2 LEVELS (Back) - Laminectomy and Foraminotomy  - L4-L5 - L5-S1   Anesthesia type: General   Diagnosis: Radiculopathy, lumbar region [M54.16]   Pre-op diagnosis: Radiculopathy, lumbar region   [facility]: [facility] OR ROOM 20 / [facility] OR   Surgeons: Joshua Alm Hamilton, MD       DISCUSSION: Patient is a 63 year old female scheduled for the above procedure.  History includes never smoker, HTN, clotting disorder, DVT (RLE DVT 02/08/19), PE (bilateral PE, saddle embolus with right heart strain 02/08/19), seizures, HLD, anxiety, depression, cholecystectomy (07/02/13).  She is s/p a potentially retrievable IVC filter on 02/13/24.   VS: BP 124/78   Pulse 76   Temp 36.8 C   Resp 17   Ht 5' 4 (1.626 m)   Wt 93.6 kg   SpO2 97%   BMI 35.41 kg/m   PROVIDERS: Jodie Lavern CROME, MD is PCP    LABS: {CHL AN LABS REVIEWED:112001::Labs reviewed: Acceptable for surgery.} (all labs ordered are listed, but only abnormal results are displayed)  Labs Reviewed  BASIC METABOLIC PANEL WITH GFR - Abnormal; Notable for the following components:      Result Value   BUN 26 (*)    All other components within normal limits  SURGICAL PCR SCREEN  CBC     IMAGES: MRI C-spine 01/11/24:  IMPRESSION: MRI scan of the cervical spine without contrast showing prominent spondylitic changes at C5-6 and C6/7 with mild canal and moderate left-sided foraminal narrowing.   MRI T-spine 01/04/24: IMPRESSION: Unremarkable MRI scan of the thoracic spine showing only minor disc and facet degenerative changes without significant compression.   MRI L-spine 01/04/24: IMPRESSION:  MRI scan of the lumbar spine without contrast showing prominent disc and facet degenerative changes throughout most prominent at L4-5 where there is severe left and moderate right-sided foraminal narrowing with possible encroachment of  the exiting L5 nerve root on the left.   MRI Brain 12/21/23: IMPRESSION: Unremarkable MRI scan of the brain with and without contrast.     EKG: 07/29/23: NSR   CV: US  Carotid 08/02/23: Summary:  - Right Carotid: The extracranial vessels were near-normal with only minimal wall thickening or plaque.  - Left Carotid: The extracranial vessels were near-normal with only minimal  wall thickening or plaque.  - Vertebrals:  Bilateral vertebral arteries demonstrate antegrade flow.  - Subclavians: Normal flow hemodynamics were seen in bilateral subclavian arteries.   Cardiac cath 04/20/19: Mild to moderate LAD, circumflex, and right coronary calcification. 25 to 30% proximal to mid LAD. 40 to 50% eccentric proximal to mid circumflex. 30% mid RCA. Normal LV function.  LVEDP is normal.   RECOMMENDATIONS:  Optimal medical therapy for nonobstructive coronary atherosclerosis: Lifestyle modifications as needed for heart health, aggressive lipid-lowering, assessment of glycemic control, blood pressure control. In context of clinical symptoms, intervention is not appropriate.  FFR in circumflex is borderline significant.  This should be followed clinically over time with PCI to be considered if symptoms not controllable with medical therapy.   CTA Coronary with FFRct 04/06/19: MPRESSION: 1. Coronary calcium  score of 307. This was 97th percentile for age and sex matched control. 2. Severe stenosis in the proximal LAD and moderate stenosis in the proximal LCX. 3. Dilated right atrium and right ventricle. IMPRESSION: 1.  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    Echo 03/08/19: IMPRESSIONS   1. The left ventricle  has normal systolic function with an ejection  fraction of 60-65%. The cavity size was normal. There is mildly increased  left ventricular wall thickness. Left ventricular diastolic Doppler  parameters are consistent with impaired  relaxation. Indeterminate  filling pressures The E/e' is 8-15. No evidence  of left ventricular regional wall motion abnormalities.   2. The mitral valve is grossly normal.   3. The tricuspid valve is grossly normal.   4. The aortic valve is tricuspid. Aortic valve regurgitation is trivial  by color flow Doppler. No stenosis of the aortic valve.   5. The inferior vena cava was dilated in size with <50% respiratory  variability.    Past Medical History:  Diagnosis Date   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   Allergy    Anxiety    Calculus of gallbladder with acute cholecystitis, without mention of obstruction 07/03/2013   Clotting disorder (HCC)    Depression    DVT (deep vein thrombosis) in pregnancy    Gallstones    Hyperlipidemia    Hypertension    Pulmonary embolism (HCC)    Seizures (HCC)     Past Surgical History:  Procedure Laterality Date   CHOLECYSTECTOMY N/A 07/02/2013   Procedure: LAPAROSCOPIC CHOLECYSTECTOMY WITH INTRAOPERATIVE CHOLANGIOGRAM;  Surgeon: Elon CHRISTELLA Pacini, MD;  Location: [facility] ORS;  Service: General;  Laterality: N/A;   IR IVC FILTER PLMT / S&I PORTER GUID/MOD SED  02/13/2024   IR RADIOLOGIST EVAL & MGMT  02/02/2024   LEFT HEART CATH AND CORONARY ANGIOGRAPHY N/A 04/20/2019   Procedure: LEFT HEART CATH AND CORONARY ANGIOGRAPHY;  Surgeon: Claudene Victory ORN, MD;  Location: [facility] INVASIVE CV LAB;  Service: Cardiovascular;  Laterality: N/A;    MEDICATIONS:  ALPRAZolam  (XANAX ) 1 MG tablet   diazepam  (VALIUM ) 5 MG tablet   ezetimibe  (ZETIA ) 10 MG tablet   olmesartan -hydrochlorothiazide  (BENICAR  HCT) 40-25 MG tablet   PHENobarbital  (LUMINAL) 64.8 MG tablet   XARELTO  20 MG TABS tablet   No current facility-administered medications for this encounter.

## 2024-02-15 ENCOUNTER — Encounter (HOSPITAL_COMMUNITY): Payer: Self-pay

## 2024-02-15 NOTE — Anesthesia Preprocedure Evaluation (Addendum)
 Anesthesia Evaluation  Patient identified by MRN, date of birth, ID band Patient awake    Reviewed: Allergy & Precautions, NPO status , Patient's Chart, lab work & pertinent test results  History of Anesthesia Complications Negative for: history of anesthetic complications  Airway Mallampati: II  TM Distance: >3 FB Neck ROM: Full    Dental  (+) Dental Advisory Given   Pulmonary    breath sounds clear to auscultation       Cardiovascular hypertension, + CAD  + Valvular Problems/Murmurs AS  Rhythm:Regular     Neuro/Psych Seizures -,     GI/Hepatic   Endo/Other    Renal/GU      Musculoskeletal   Abdominal   Peds  Hematology   Anesthesia Other Findings   Reproductive/Obstetrics                              Anesthesia Physical Anesthesia Plan  ASA: 2  Anesthesia Plan: General   Post-op Pain Management:    Induction: Intravenous  PONV Risk Score and Plan: 4 or greater and Ondansetron , Dexamethasone  and Midazolam   Airway Management Planned: Oral ETT  Additional Equipment: None  Intra-op Plan:   Post-operative Plan: Extubation in OR  Informed Consent: I have reviewed the patients History and Physical, chart, labs and discussed the procedure including the risks, benefits and alternatives for the proposed anesthesia with the patient or authorized representative who has indicated his/her understanding and acceptance.     Dental advisory given  Plan Discussed with: CRNA  Anesthesia Plan Comments: (PAT note written 02/15/2024 by Allison Zelenak, PA-C.  )         Anesthesia Quick Evaluation

## 2024-02-23 NOTE — Progress Notes (Signed)
 Patient was called to be informed that the surgery time for tomorrow was changed to 11:19 o'clock. Patient was instructed to be at the hospital at 09:20 o'clock and to stop drinking clear liquids at 08:20 o'clock. Patient verbalized understanding.

## 2024-02-24 ENCOUNTER — Ambulatory Visit (HOSPITAL_COMMUNITY)

## 2024-02-24 ENCOUNTER — Ambulatory Visit (HOSPITAL_COMMUNITY): Admitting: Certified Registered Nurse Anesthetist

## 2024-02-24 ENCOUNTER — Ambulatory Visit (HOSPITAL_COMMUNITY): Payer: Self-pay | Admitting: Vascular Surgery

## 2024-02-24 ENCOUNTER — Ambulatory Visit (HOSPITAL_COMMUNITY)
Admission: RE | Disposition: A | Discharge status: Home / Self Care | Payer: Self-pay | Source: Home / Self Care | Attending: Neurological Surgery

## 2024-02-24 ENCOUNTER — Observation Stay (HOSPITAL_COMMUNITY)
Admission: RE | Admit: 2024-02-24 | Discharge: 2024-02-27 | Disposition: A | Discharge status: Home / Self Care | Payer: Self-pay | Attending: Neurological Surgery | Admitting: Neurological Surgery

## 2024-02-24 ENCOUNTER — Other Ambulatory Visit: Payer: Self-pay

## 2024-02-24 ENCOUNTER — Encounter (HOSPITAL_COMMUNITY): Payer: Self-pay | Admitting: Neurological Surgery

## 2024-02-24 DIAGNOSIS — Z7901 Long term (current) use of anticoagulants: Secondary | ICD-10-CM | POA: Insufficient documentation

## 2024-02-24 DIAGNOSIS — M21371 Foot drop, right foot: Secondary | ICD-10-CM | POA: Insufficient documentation

## 2024-02-24 DIAGNOSIS — Z79899 Other long term (current) drug therapy: Secondary | ICD-10-CM | POA: Insufficient documentation

## 2024-02-24 DIAGNOSIS — Z86718 Personal history of other venous thrombosis and embolism: Secondary | ICD-10-CM | POA: Insufficient documentation

## 2024-02-24 DIAGNOSIS — Z9889 Other specified postprocedural states: Principal | ICD-10-CM

## 2024-02-24 DIAGNOSIS — M21372 Foot drop, left foot: Secondary | ICD-10-CM | POA: Insufficient documentation

## 2024-02-24 DIAGNOSIS — I10 Essential (primary) hypertension: Secondary | ICD-10-CM | POA: Insufficient documentation

## 2024-02-24 DIAGNOSIS — Z86711 Personal history of pulmonary embolism: Secondary | ICD-10-CM | POA: Insufficient documentation

## 2024-02-24 DIAGNOSIS — M5416 Radiculopathy, lumbar region: Principal | ICD-10-CM | POA: Insufficient documentation

## 2024-02-24 DIAGNOSIS — I2510 Atherosclerotic heart disease of native coronary artery without angina pectoris: Secondary | ICD-10-CM | POA: Insufficient documentation

## 2024-02-24 DIAGNOSIS — M48061 Spinal stenosis, lumbar region without neurogenic claudication: Secondary | ICD-10-CM | POA: Insufficient documentation

## 2024-02-24 HISTORY — PX: LUMBAR LAMINECTOMY/DECOMPRESSION MICRODISCECTOMY: SHX5026

## 2024-02-24 SURGERY — LUMBAR LAMINECTOMY/DECOMPRESSION MICRODISCECTOMY 2 LEVELS
Anesthesia: General | Site: Back

## 2024-02-24 MED ORDER — ROCURONIUM BROMIDE 10 MG/ML (PF) SYRINGE
PREFILLED_SYRINGE | INTRAVENOUS | Status: AC
  Filled 2024-02-24: qty 10

## 2024-02-24 MED ORDER — SUGAMMADEX SODIUM 200 MG/2ML IV SOLN
INTRAVENOUS | Status: DC | PRN
  Administered 2024-02-24: 200 mg via INTRAVENOUS

## 2024-02-24 MED ORDER — POTASSIUM CHLORIDE IN NACL 20-0.9 MEQ/L-% IV SOLN
INTRAVENOUS | Status: AC
  Filled 2024-02-24: qty 1000

## 2024-02-24 MED ORDER — EPHEDRINE SULFATE-NACL 50-0.9 MG/10ML-% IV SOSY
PREFILLED_SYRINGE | INTRAVENOUS | Status: DC | PRN
  Administered 2024-02-24: 5 mg via INTRAVENOUS

## 2024-02-24 MED ORDER — HYDROCODONE-ACETAMINOPHEN 10-325 MG PO TABS
1.0000 | ORAL_TABLET | ORAL | Status: DC | PRN
  Administered 2024-02-24 – 2024-02-26 (×4): 1 via ORAL
  Filled 2024-02-24 (×6): qty 1

## 2024-02-24 MED ORDER — CHLORHEXIDINE GLUCONATE 0.12 % MT SOLN
15.0000 mL | Freq: Once | OROMUCOSAL | Status: AC
  Administered 2024-02-24: 15 mL via OROMUCOSAL
  Filled 2024-02-24: qty 15

## 2024-02-24 MED ORDER — ONDANSETRON HCL 4 MG/2ML IJ SOLN
INTRAMUSCULAR | Status: DC | PRN
  Administered 2024-02-24: 4 mg via INTRAVENOUS

## 2024-02-24 MED ORDER — MENTHOL 3 MG MT LOZG: 1.0000 | LOZENGE | OROMUCOSAL | Status: DC | PRN

## 2024-02-24 MED ORDER — METHOCARBAMOL 500 MG PO TABS
500.0000 mg | ORAL_TABLET | Freq: Four times a day (QID) | ORAL | Status: DC | PRN
Start: 2024-02-24 — End: 2024-02-27

## 2024-02-24 MED ORDER — PHENOBARBITAL 32.4 MG PO TABS
129.6000 mg | ORAL_TABLET | Freq: Every day | ORAL | Status: DC
  Administered 2024-02-24 – 2024-02-26 (×3): 129.6 mg via ORAL
  Filled 2024-02-24 (×3): qty 4

## 2024-02-24 MED ORDER — OLMESARTAN MEDOXOMIL-HCTZ 40-25 MG PO TABS: 1.0000 | ORAL_TABLET | Freq: Every day | ORAL | Status: DC

## 2024-02-24 MED ORDER — THROMBIN 5000 UNITS EX SOLR
CUTANEOUS | Status: DC | PRN
  Administered 2024-02-24: 5 mL via TOPICAL

## 2024-02-24 MED ORDER — LIDOCAINE 2% (20 MG/ML) 5 ML SYRINGE
INTRAMUSCULAR | Status: DC | PRN
  Administered 2024-02-24: 60 mg via INTRAVENOUS

## 2024-02-24 MED ORDER — METHOCARBAMOL 1000 MG/10ML IJ SOLN: 500.0000 mg | Freq: Four times a day (QID) | INTRAMUSCULAR | Status: DC | PRN

## 2024-02-24 MED ORDER — ACETAMINOPHEN 650 MG RE SUPP: 650.0000 mg | RECTAL | Status: DC | PRN

## 2024-02-24 MED ORDER — PROPOFOL 10 MG/ML IV BOLUS
INTRAVENOUS | Status: AC
  Filled 2024-02-24: qty 20

## 2024-02-24 MED ORDER — ROCURONIUM BROMIDE 10 MG/ML (PF) SYRINGE
PREFILLED_SYRINGE | INTRAVENOUS | Status: AC
Start: 2024-02-24 — End: 2024-02-24
  Filled 2024-02-24: qty 10

## 2024-02-24 MED ORDER — ONDANSETRON HCL 4 MG PO TABS: 4.0000 mg | ORAL_TABLET | Freq: Four times a day (QID) | ORAL | Status: DC | PRN

## 2024-02-24 MED ORDER — PHENYLEPHRINE HCL-NACL 20-0.9 MG/250ML-% IV SOLN
INTRAVENOUS | Status: DC | PRN
Start: 2024-02-24 — End: 2024-02-24
  Administered 2024-02-24: 40 ug/min via INTRAVENOUS

## 2024-02-24 MED ORDER — ACETAMINOPHEN 500 MG PO TABS
1000.0000 mg | ORAL_TABLET | ORAL | Status: AC
  Administered 2024-02-24: 1000 mg via ORAL
  Filled 2024-02-24: qty 2

## 2024-02-24 MED ORDER — DEXAMETHASONE SODIUM PHOSPHATE 10 MG/ML IJ SOLN
INTRAMUSCULAR | Status: AC
  Filled 2024-02-24: qty 1

## 2024-02-24 MED ORDER — LACTATED RINGERS IV SOLN: INTRAVENOUS | Status: DC | PRN

## 2024-02-24 MED ORDER — SENNA 8.6 MG PO TABS
1.0000 | ORAL_TABLET | Freq: Two times a day (BID) | ORAL | Status: DC
  Administered 2024-02-24 – 2024-02-26 (×5): 8.6 mg via ORAL
  Filled 2024-02-24 (×6): qty 1

## 2024-02-24 MED ORDER — IRBESARTAN 300 MG PO TABS
300.0000 mg | ORAL_TABLET | Freq: Every day | ORAL | Status: DC
  Administered 2024-02-24 – 2024-02-26 (×3): 300 mg via ORAL
  Filled 2024-02-24 (×4): qty 1

## 2024-02-24 MED ORDER — CEFAZOLIN SODIUM-DEXTROSE 2-4 GM/100ML-% IV SOLN
2.0000 g | Freq: Three times a day (TID) | INTRAVENOUS | Status: AC
  Administered 2024-02-24 – 2024-02-25 (×2): 2 g via INTRAVENOUS
  Filled 2024-02-24 (×2): qty 100

## 2024-02-24 MED ORDER — GABAPENTIN 300 MG PO CAPS
300.0000 mg | ORAL_CAPSULE | ORAL | Status: AC
  Administered 2024-02-24: 300 mg via ORAL
  Filled 2024-02-24: qty 1

## 2024-02-24 MED ORDER — CHLORHEXIDINE GLUCONATE CLOTH 2 % EX PADS: 6.0000 | MEDICATED_PAD | Freq: Once | CUTANEOUS | Status: DC

## 2024-02-24 MED ORDER — DEXAMETHASONE SODIUM PHOSPHATE 4 MG/ML IJ SOLN: 4.0000 mg | Freq: Four times a day (QID) | INTRAMUSCULAR | Status: DC

## 2024-02-24 MED ORDER — THROMBIN (RECOMBINANT) 5000 UNITS EX SOLR
CUTANEOUS | Status: DC | PRN
  Administered 2024-02-24: 10 mL via TOPICAL

## 2024-02-24 MED ORDER — BUPIVACAINE HCL (PF) 0.25 % IJ SOLN
INTRAMUSCULAR | Status: DC | PRN
  Administered 2024-02-24: 6 mL

## 2024-02-24 MED ORDER — PHENOBARBITAL 64.8 MG PO TABS: 139.6000 mg | ORAL_TABLET | Freq: Every day | ORAL | Status: DC

## 2024-02-24 MED ORDER — SODIUM CHLORIDE 0.9% FLUSH
3.0000 mL | Freq: Two times a day (BID) | INTRAVENOUS | Status: DC
  Administered 2024-02-24 – 2024-02-27 (×6): 3 mL via INTRAVENOUS

## 2024-02-24 MED ORDER — ACETAMINOPHEN 325 MG PO TABS
650.0000 mg | ORAL_TABLET | ORAL | Status: DC | PRN
  Filled 2024-02-24: qty 2

## 2024-02-24 MED ORDER — CEFAZOLIN SODIUM-DEXTROSE 2-4 GM/100ML-% IV SOLN
2.0000 g | INTRAVENOUS | Status: AC
  Administered 2024-02-24: 2 g via INTRAVENOUS
  Filled 2024-02-24: qty 100

## 2024-02-24 MED ORDER — LACTATED RINGERS IV SOLN: INTRAVENOUS | Status: DC

## 2024-02-24 MED ORDER — HYDROCHLOROTHIAZIDE 25 MG PO TABS
25.0000 mg | ORAL_TABLET | Freq: Every day | ORAL | Status: DC
  Administered 2024-02-27: 25 mg via ORAL
  Filled 2024-02-24 (×4): qty 1

## 2024-02-24 MED ORDER — PHENOL 1.4 % MT LIQD: 1.0000 | OROMUCOSAL | Status: DC | PRN

## 2024-02-24 MED ORDER — FENTANYL CITRATE (PF) 250 MCG/5ML IJ SOLN
INTRAMUSCULAR | Status: AC
  Filled 2024-02-24: qty 5

## 2024-02-24 MED ORDER — DEXAMETHASONE SODIUM PHOSPHATE 10 MG/ML IJ SOLN
INTRAMUSCULAR | Status: DC | PRN
  Administered 2024-02-24: 10 mg via INTRAVENOUS

## 2024-02-24 MED ORDER — PROPOFOL 10 MG/ML IV BOLUS
INTRAVENOUS | Status: DC | PRN
  Administered 2024-02-24: 110 mg via INTRAVENOUS
  Administered 2024-02-24: 20 mg via INTRAVENOUS

## 2024-02-24 MED ORDER — DEXAMETHASONE 4 MG PO TABS
4.0000 mg | ORAL_TABLET | Freq: Four times a day (QID) | ORAL | Status: DC
  Administered 2024-02-24 – 2024-02-27 (×11): 4 mg via ORAL
  Filled 2024-02-24 (×11): qty 1

## 2024-02-24 MED ORDER — ROCURONIUM BROMIDE 10 MG/ML (PF) SYRINGE
PREFILLED_SYRINGE | INTRAVENOUS | Status: DC | PRN
  Administered 2024-02-24: 70 mg via INTRAVENOUS

## 2024-02-24 MED ORDER — FENTANYL CITRATE (PF) 100 MCG/2ML IJ SOLN
INTRAMUSCULAR | Status: AC
  Filled 2024-02-24: qty 2

## 2024-02-24 MED ORDER — ORAL CARE MOUTH RINSE: 15.0000 mL | Freq: Once | OROMUCOSAL | Status: AC

## 2024-02-24 MED ORDER — BUPIVACAINE HCL (PF) 0.25 % IJ SOLN
INTRAMUSCULAR | Status: AC
  Filled 2024-02-24: qty 30

## 2024-02-24 MED ORDER — SODIUM CHLORIDE 0.9% FLUSH: 3.0000 mL | INTRAVENOUS | Status: DC | PRN

## 2024-02-24 MED ORDER — ONDANSETRON HCL 4 MG/2ML IJ SOLN
INTRAMUSCULAR | Status: AC
  Filled 2024-02-24: qty 2

## 2024-02-24 MED ORDER — THROMBIN 5000 UNITS EX KIT
PACK | CUTANEOUS | Status: AC
  Filled 2024-02-24: qty 3

## 2024-02-24 MED ORDER — SUGAMMADEX SODIUM 200 MG/2ML IV SOLN
INTRAVENOUS | Status: AC
Start: 2024-02-24 — End: 2024-02-24
  Filled 2024-02-24: qty 2

## 2024-02-24 MED ORDER — EPHEDRINE 5 MG/ML INJ
INTRAVENOUS | Status: AC
  Filled 2024-02-24: qty 5

## 2024-02-24 MED ORDER — FENTANYL CITRATE (PF) 250 MCG/5ML IJ SOLN
INTRAMUSCULAR | Status: DC | PRN
  Administered 2024-02-24: 50 ug via INTRAVENOUS
  Administered 2024-02-24: 100 ug via INTRAVENOUS
  Administered 2024-02-24 (×2): 50 ug via INTRAVENOUS

## 2024-02-24 MED ORDER — 0.9 % SODIUM CHLORIDE (POUR BTL) OPTIME
TOPICAL | Status: DC | PRN
  Administered 2024-02-24: 1000 mL

## 2024-02-24 MED ORDER — LIDOCAINE 2% (20 MG/ML) 5 ML SYRINGE
INTRAMUSCULAR | Status: AC
Start: 2024-02-24 — End: 2024-02-24
  Filled 2024-02-24: qty 5

## 2024-02-24 MED ORDER — MIDAZOLAM HCL 2 MG/2ML IJ SOLN
INTRAMUSCULAR | Status: DC | PRN
  Administered 2024-02-24: 2 mg via INTRAVENOUS

## 2024-02-24 MED ORDER — HYDROCODONE-ACETAMINOPHEN 5-325 MG PO TABS
1.0000 | ORAL_TABLET | ORAL | Status: DC | PRN
  Administered 2024-02-25 – 2024-02-27 (×3): 1 via ORAL
  Filled 2024-02-24 (×3): qty 1

## 2024-02-24 MED ORDER — PHENYLEPHRINE 80 MCG/ML (10ML) SYRINGE FOR IV PUSH (FOR BLOOD PRESSURE SUPPORT)
PREFILLED_SYRINGE | INTRAVENOUS | Status: DC | PRN
  Administered 2024-02-24: 160 ug via INTRAVENOUS
  Administered 2024-02-24: 80 ug via INTRAVENOUS

## 2024-02-24 MED ORDER — FENTANYL CITRATE (PF) 100 MCG/2ML IJ SOLN
25.0000 ug | INTRAMUSCULAR | Status: DC | PRN
  Administered 2024-02-24: 50 ug via INTRAVENOUS

## 2024-02-24 MED ORDER — PHENYLEPHRINE 80 MCG/ML (10ML) SYRINGE FOR IV PUSH (FOR BLOOD PRESSURE SUPPORT)
PREFILLED_SYRINGE | INTRAVENOUS | Status: AC
  Filled 2024-02-24: qty 10

## 2024-02-24 MED ORDER — ONDANSETRON HCL 4 MG/2ML IJ SOLN: 4.0000 mg | Freq: Four times a day (QID) | INTRAMUSCULAR | Status: DC | PRN

## 2024-02-24 MED ORDER — SODIUM CHLORIDE 0.9 % IV SOLN
250.0000 mL | INTRAVENOUS | Status: AC
  Administered 2024-02-24: 250 mL via INTRAVENOUS

## 2024-02-24 MED ORDER — MIDAZOLAM HCL 2 MG/2ML IJ SOLN
INTRAMUSCULAR | Status: AC
  Filled 2024-02-24: qty 2

## 2024-02-24 MED ORDER — HYDROCODONE-ACETAMINOPHEN 7.5-325 MG PO TABS
1.0000 | ORAL_TABLET | Freq: Four times a day (QID) | ORAL | Status: DC
  Administered 2024-02-24: 1 via ORAL
  Filled 2024-02-24: qty 1

## 2024-02-24 SURGICAL SUPPLY — 38 items
BAG COUNTER SPONGE SURGICOUNT (BAG) ×2 IMPLANT
BAND RUBBER #18 3X1/16 STRL (MISCELLANEOUS) ×4 IMPLANT
BENZOIN TINCTURE PRP APPL 2/3 (GAUZE/BANDAGES/DRESSINGS) ×2 IMPLANT
BUR CARBIDE MATCH 3.0 (BURR) ×2 IMPLANT
CANISTER SUCTION 3000ML PPV (SUCTIONS) ×2 IMPLANT
CLSR STERI-STRIP ANTIMIC 1/2X4 (GAUZE/BANDAGES/DRESSINGS) IMPLANT
DRAPE C-ARM 42X72 X-RAY (DRAPES) IMPLANT
DRAPE LAPAROTOMY 100X72X124 (DRAPES) ×2 IMPLANT
DRAPE MICROSCOPE SLANT 54X150 (MISCELLANEOUS) ×2 IMPLANT
DRAPE SURG 17X23 STRL (DRAPES) ×2 IMPLANT
DRSG OPSITE POSTOP 3X4 (GAUZE/BANDAGES/DRESSINGS) IMPLANT
DURAPREP 26ML APPLICATOR (WOUND CARE) ×2 IMPLANT
ELECTRODE REM PT RTRN 9FT ADLT (ELECTROSURGICAL) ×2 IMPLANT
GAUZE 4X4 16PLY ~~LOC~~+RFID DBL (SPONGE) IMPLANT
GLOVE BIO SURGEON STRL SZ7 (GLOVE) IMPLANT
GLOVE BIO SURGEON STRL SZ8 (GLOVE) ×2 IMPLANT
GLOVE BIOGEL PI IND STRL 7.0 (GLOVE) IMPLANT
GOWN STRL REUS W/ TWL LRG LVL3 (GOWN DISPOSABLE) IMPLANT
GOWN STRL REUS W/ TWL XL LVL3 (GOWN DISPOSABLE) ×2 IMPLANT
GOWN STRL REUS W/TWL 2XL LVL3 (GOWN DISPOSABLE) IMPLANT
HEMOSTAT POWDER KIT SURGIFOAM (HEMOSTASIS) ×2 IMPLANT
KIT BASIN OR (CUSTOM PROCEDURE TRAY) ×2 IMPLANT
KIT TURNOVER KIT B (KITS) ×2 IMPLANT
NDL HYPO 25X1 1.5 SAFETY (NEEDLE) ×2 IMPLANT
NDL SPNL 20GX3.5 QUINCKE YW (NEEDLE) IMPLANT
NEEDLE HYPO 25X1 1.5 SAFETY (NEEDLE) ×1 IMPLANT
NEEDLE SPNL 20GX3.5 QUINCKE YW (NEEDLE) IMPLANT
NS IRRIG 1000ML POUR BTL (IV SOLUTION) ×2 IMPLANT
PACK LAMINECTOMY NEURO (CUSTOM PROCEDURE TRAY) ×2 IMPLANT
PAD ARMBOARD POSITIONER FOAM (MISCELLANEOUS) ×6 IMPLANT
SPONGE SURGIFOAM ABS GEL SZ50 (HEMOSTASIS) ×2 IMPLANT
STRIP CLOSURE SKIN 1/2X4 (GAUZE/BANDAGES/DRESSINGS) ×2 IMPLANT
SUT VIC AB 0 CT1 18XCR BRD8 (SUTURE) ×2 IMPLANT
SUT VIC AB 2-0 CP2 18 (SUTURE) ×2 IMPLANT
SUT VIC AB 3-0 SH 8-18 (SUTURE) ×2 IMPLANT
TOWEL GREEN STERILE (TOWEL DISPOSABLE) ×2 IMPLANT
TOWEL GREEN STERILE FF (TOWEL DISPOSABLE) ×2 IMPLANT
WATER STERILE IRR 1000ML POUR (IV SOLUTION) ×2 IMPLANT

## 2024-02-24 NOTE — Transfer of Care (Signed)
 Immediate Anesthesia Transfer of Care Note  Patient: Sarah Chung  Procedure(s) Performed: Laminectomy and Foraminotomy - Lumbar Four-Lumbar Five - Lumbar Five-Sacral One (Back)  Patient Location: PACU  Anesthesia Type:General  Level of Consciousness: awake and alert   Airway & Oxygen Therapy: Patient Spontanous Breathing and Patient connected to face mask oxygen  Post-op Assessment: Report given to RN, Post -op Vital signs reviewed and stable, and Patient moving all extremities X 4  Post vital signs: Reviewed and stable  Last Vitals:  Vitals Value Taken Time  BP 115/91 02/24/24 13:40  Temp    Pulse 93 02/24/24 13:44  Resp 18 02/24/24 13:44  SpO2 96 % 02/24/24 13:44  Vitals shown include unfiled device data.  Last Pain:  Vitals:   02/24/24 1021  TempSrc:   PainSc: 0-No pain         Complications: No notable events documented.

## 2024-02-24 NOTE — Anesthesia Procedure Notes (Signed)
 Procedure Name: Intubation Date/Time: 02/24/2024 12:09 PM  Performed by: Roddie Grate, CRNAPre-anesthesia Checklist: Patient identified, Emergency Drugs available, Suction available, Patient being monitored and Timeout performed Patient Re-evaluated:Patient Re-evaluated prior to induction Oxygen Delivery Method: Circle system utilized Preoxygenation: Pre-oxygenation with 100% oxygen Induction Type: IV induction Ventilation: Mask ventilation without difficulty Laryngoscope Size: Mac and 3 Grade View: Grade I Tube type: Oral Tube size: 7.0 mm Number of attempts: 1 Airway Equipment and Method: Stylet Placement Confirmation: ETT inserted through vocal cords under direct vision, positive ETCO2 and breath sounds checked- equal and bilateral Secured at: 22 cm Tube secured with: Tape Dental Injury: Teeth and Oropharynx as per pre-operative assessment  Comments: Smooth IV Induction. Eyes taped. Easy mask. DL x 1 with grade 1 view. Atraumatically placed, teeth and lip remain intact as pre-op. Secured with tape. Bilateral breath sounds +/=, EtCO2 +, Adequate TV, VSS.

## 2024-02-24 NOTE — H&P (Signed)
 Subjective: Patient is a 63 y.o. female admitted for foot drop L>R. Onset of symptoms was several months ago, gradually worsening since that time.  The pain is rated mild, and is located at the across the lower back and radiates to legs. The pain is described as aching and occurs intermittently. The symptoms have been progressive. Symptoms are exacerbated by exercise and walking for more than a few minutes. MRI or CT showed some stenosis, nerve conduction studies done.   Past Medical History:  Diagnosis Date   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   Allergy    Anxiety    Calculus of gallbladder with acute cholecystitis, without mention of obstruction 07/03/2013   Clotting disorder (HCC)    Coronary artery disease    Depression    DVT (deep venous thrombosis) (HCC)    LLE post-c-section 1993, unprovoked RLE 02/2019   Gallstones    Hyperlipidemia    Hypertension    Pulmonary embolism (HCC) 02/08/2019   Seizures (HCC)     Past Surgical History:  Procedure Laterality Date   CHOLECYSTECTOMY N/A 07/02/2013   Procedure: LAPAROSCOPIC CHOLECYSTECTOMY WITH INTRAOPERATIVE CHOLANGIOGRAM;  Surgeon: Elon CHRISTELLA Pacini, MD;  Location: [facility];  Service: General;  Laterality: N/A;   IR IVC FILTER PLMT / S&I PORTER GUID/MOD SED  02/13/2024   IR RADIOLOGIST EVAL & MGMT  02/02/2024   LEFT HEART CATH AND CORONARY ANGIOGRAPHY N/A 04/20/2019   Procedure: LEFT HEART CATH AND CORONARY ANGIOGRAPHY;  Surgeon: Claudene Victory ORN, MD;  Location: [facility];  Service: Cardiovascular;  Laterality: N/A;    Prior to Admission medications   Medication Sig Start Date End Date Taking? Authorizing Provider  diazepam  (VALIUM ) 5 MG tablet Take 1 tablet (5 mg total) by mouth in the morning and at bedtime. Patient taking differently: Take 10 mg by mouth at bedtime as needed for muscle spasms. 02/03/24  Yes Sater, Charlie LABOR, MD  olmesartan -hydrochlorothiazide  (BENICAR  HCT) 40-25 MG tablet Take 1 tablet by mouth daily. 08/04/23  Yes Jodie Lavern CROME, MD  PHENobarbital  (LUMINAL) 64.8 MG tablet Take 2 tablets (129.6 mg total) by mouth at bedtime. 11/08/23  Yes Onita Duos, MD  XARELTO  20 MG TABS tablet TAKE 1 TABLET(20 MG) BY MOUTH DAILY WITH SUPPER 05/09/23  Yes Okey Vina GAILS, MD  ALPRAZolam  (XANAX ) 1 MG tablet Take 1 tablet (1 mg total) by mouth at bedtime as needed. Patient not taking: Reported on 12/30/2023 12/14/23   Dohmeier, Dedra, MD  ezetimibe  (ZETIA ) 10 MG tablet TAKE 1 TABLET(10 MG) BY MOUTH DAILY Patient not taking: Reported on 12/14/2023 03/11/23   Okey Vina GAILS, MD   Allergies  Allergen Reactions   Tegretol [Carbamazepine] Hives   Ace Inhibitors Cough   Iodinated Contrast Media Nausea Only   Keppra [Levetiracetam]     Feels outside her body.SABRAloopy   Ketorolac Tromethamine    Sulfa Antibiotics Hives   Lisinopril -Hydrochlorothiazide  Cough    Social History   Tobacco Use   Smoking status: Never   Smokeless tobacco: Never  Substance Use Topics   Alcohol use: No    Family History  Problem Relation Age of Onset   Heart attack Father    Parkinson's disease Father    Heart disease Father    Heart disease Brother    Heart attack Brother    Mental illness Mother    Yvone' disease Mother    Depression Sister    Kidney disease Maternal Grandfather    Heart attack Paternal Grandmother  Review of Systems  Positive ROS: neg  All other systems have been reviewed and were otherwise negative with the exception of those mentioned in the HPI and as above.  Objective: Vital signs in last 24 hours: Temp:  [98.2 F (36.8 C)] 98.2 F (36.8 C) (07/18 0941) Pulse Rate:  [96] 96 (07/18 0941) Resp:  [18] 18 (07/18 0941) BP: (155)/(88) 155/88 (07/18 0941) SpO2:  [97 %] 97 % (07/18 0941) Weight:  [91.2 kg] 91.2 kg (07/18 0941)  General Appearance: Alert, cooperative, no distress, appears stated age Head: Normocephalic, without obvious abnormality, atraumatic Eyes: PERRL, conjunctiva/corneas clear, EOM's intact     Neck: Supple, symmetrical, trachea midline Back: Symmetric, no curvature, ROM normal, no CVA tenderness Lungs:  respirations unlabored Heart: Regular rate and rhythm Abdomen: Soft, non-tender Extremities: Extremities normal, atraumatic, no cyanosis or edema Pulses: 2+ and symmetric all extremities Skin: Skin color, texture, turgor normal, no rashes or lesions  NEUROLOGIC:   Mental status: Alert and oriented x4,  no aphasia, good attention span, fund of knowledge, and memory Motor Exam - grossly normal Sensory Exam - grossly normal Reflexes: 1= Coordination - grossly normal Gait - grossly normal Balance - grossly normal Cranial Nerves: I: smell Not tested  II: visual acuity  OS: nl    OD: nl  II: visual fields Full to confrontation  II: pupils Equal, round, reactive to light  III,VII: ptosis None  III,IV,VI: extraocular muscles  Full ROM  V: mastication Normal  V: facial light touch sensation  Normal  V,VII: corneal reflex  Present  VII: facial muscle function - upper  Normal  VII: facial muscle function - lower Normal  VIII: hearing Not tested  IX: soft palate elevation  Normal  IX,X: gag reflex Present  XI: trapezius strength  5/5  XI: sternocleidomastoid strength 5/5  XI: neck flexion strength  5/5  XII: tongue strength  Normal    Data Review Lab Results  Component Value Date   WBC 8.4 02/13/2024   HGB 12.2 02/13/2024   HCT 38.4 02/13/2024   MCV 96.7 02/13/2024   PLT 284 02/13/2024   Lab Results  Component Value Date   NA 138 02/13/2024   K 4.0 02/13/2024   CL 102 02/13/2024   CO2 25 02/13/2024   BUN 26 (H) 02/13/2024   CREATININE 1.00 02/13/2024   GLUCOSE 97 02/13/2024   Lab Results  Component Value Date   INR 1.8 (H) 02/09/2019    Assessment/Plan:  Estimated body mass index is 34.5 kg/m as calculated from the following:   Height as of this encounter: 5' 4 (1.626 m).   Weight as of this encounter: 91.2 kg. Patient admitted for L4-5, L5-S1  laminectomy. Patient has failed a reasonable attempt at conservative therapy.  I explained the condition and procedure to the patient and answered any questions.  Patient wishes to proceed with procedure as planned. Understands risks/ benefits and typical outcomes of procedure.   Alm GORMAN Molt 02/24/2024 11:25 AM

## 2024-02-24 NOTE — Op Note (Signed)
 02/24/2024  1:31 PM  PATIENT:  Sarah Chung  63 y.o. 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 Imaging showed some stenosis at L4-5 and L5-S1 with a transitional segment below this, but nerve conduction studies showed bilateral acute active radiculopathies L4-S1. The patient tried conservative measures without relief. Pain was debilitating. Recommended compressive laminectomy.  Obviously with a painless foot drop there is always concern about another entity other than radiculopathy, such as ALS or peroneal neuropathy or other issue.  However nerve conduction studies did suggest bilateral active radiculopathies.  Therefore decompressive surgery was recommended.  Patient understood the risks, benefits, and alternatives and potential outcomes and wished to proceed.  PROCEDURE DETAILS: The patient was taken to the operating room and after induction of adequate generalized endotracheal anesthesia, the patient was rolled into the prone position on the Wilson frame and all pressure points were padded. The lumbar region was cleaned and then prepped with DuraPrep and draped in the usual sterile fashion. 5 cc of local anesthesia was injected and then a dorsal midline incision was made and carried down to the lumbo sacral fascia. The fascia was opened and the paraspinous musculature was taken down in a subperiosteal fashion to expose L4-5 and L5-S1 on the left. Intraoperative x-ray confirmed my level,  and then I used a combination of the high-speed drill and the Kerrison punches to perform a hemilaminectomy, medial facetectomy, and foraminotomy at L4-5 and a L5 S1 on the left (there was a transitional segment below this). The underlying yellow ligament was opened and removed in a piecemeal fashion to expose the underlying dura and exiting nerve root. I undercut the lateral recess and dissected down until I was medial to and distal to the pedicle. The nerve root was well decompressed.  I then drilled up under the spinous process at both levels and under the opposite lamina and remove the yellow ligament from the midline in the opposite lateral recess to decompress the right-hand side.  I then palpated with a coronary dilator along the nerve root and into the foramen to assure adequate decompression. I felt no more compression of the nerve root. I irrigated with saline solution containing bacitracin. Achieved hemostasis with bipolar cautery, lined the dura with Gelfoam, and then closed the fascia with 0 Vicryl. I closed the subcutaneous tissues with 2-0 Vicryl and the subcuticular tissues with 3-0 Vicryl. The skin was then closed with benzoin and Steri-Strips. The drapes were removed, a sterile dressing was applied.  My nurse practitioner was involved in the exposure, safe retraction of the neural elements, the decompression and the closure. the patient was awakened from general anesthesia and transferred to the recovery room in stable condition. At the end of the procedure all sponge, needle and instrument counts were correct.    PLAN OF CARE: Admit for overnight observation  PATIENT DISPOSITION:  PACU - hemodynamically stable.   Delay start of Pharmacological VTE agent (>24hrs) due to surgical blood loss or risk of bleeding:  yes

## 2024-02-25 NOTE — Evaluation (Signed)
 Physical Therapy Evaluation Patient Details Name: Sarah Chung MRN: [id number] DOB: 07/16/1961 Today's Date: 02/25/2024  History of Present Illness  63 y.o. female admitted 02/24/24 for foot drop L>R. Onset of symptoms was several months ago, gradually worsening since that time. Pt reports multiple falls due to tripping over left foot. 7/18 L4-S1 decompressive facetectomy and foraminotomies  PMH=clotting disorder (DVT, PE), CAD, HLD, HTN, seizures  Clinical Impression   Patient is s/p above surgery resulting in functional limitations due to the deficits listed below (see PT Problem List). Patient lives with daughter who is home intermittently in the evenings (not every night). She lives in a two-level home with 5 steps to enter with rail. She has had her bed moved to 1st level where she has a half bath. She currently requires up to mod assist for bed mobility (with max cues for technique and with use of rail). She requires min assist for transfers and CGA for ambulation with RW vs rollator. Patient with numerous questions and addressed those within PT scope. She is self-pay and states she cannot afford to go to post-acute inpatient therapies. She will likely need 2 more PT sessions (2 days) before she will be moving well enough to be home alone. Feel patient would benefit from Left [place] foot-up brace that provides and active-assist to dorsiflexion via elastric strap. Will need MD order for this. (Can order Left AFO and put in comments foot-up brace.) Patient will benefit from acute skilled PT to increase their independence and safety with mobility to facilitate discharge. Will plan to see early 7/20 on the chance she can go home after PT/OT sessions.          If plan is discharge home, recommend the following: A little help with bathing/dressing/bathroom;Assistance with cooking/housework;Assist for transportation;Help with stairs or ramp for entrance   Can travel by private vehicle         Equipment Recommendations Rolling walker (2 wheels) (may decide she does not need)  Recommendations for Other Services  OT consult    Functional Status Assessment Patient has had a recent decline in their functional status and demonstrates the ability to make significant improvements in function in a reasonable and predictable amount of time.     Precautions / Restrictions Precautions Precautions: Back Precaution Booklet Issued: No Recall of Precautions/Restrictions: Impaired Precaution/Restrictions Comments: Knew 2 of 3 Required Braces or Orthoses:  (none)      Mobility  Bed Mobility Overal bed mobility: Needs Assistance Bed Mobility: Rolling, Sidelying to Sit, Sit to Sidelying Rolling: Mod assist, Used rails Sidelying to sit: Min assist, Used rails     Sit to sidelying: Min assist, Used rails General bed mobility comments: pt educated on technique to maintain precautions and minimize strain/pain    Transfers Overall transfer level: Needs assistance Equipment used: Rolling walker (2 wheels), Rollator (4 wheels) Transfers: Sit to/from Stand Sit to Stand: Min assist           General transfer comment: from EOB min assist to RW with one hand up and one down; from toilet to rollator (pt's) using grab bar with CGA    Ambulation/Gait Ambulation/Gait assistance: Contact guard assist Gait Distance (Feet): 80 Feet Assistive device: Rolling walker (2 wheels), Rollator (4 wheels) Gait Pattern/deviations: Step-through pattern, Decreased stride length, Steppage   Gait velocity interpretation: 1.31 - 2.62 ft/sec, indicative of limited community ambulator   General Gait Details: incr reliance on bil UEs for support; steppage gait on the left; able to maintain  upright posture when using her rollator  Stairs            Wheelchair Mobility     Tilt Bed    Modified Rankin (Stroke Patients Only)       Balance Overall balance assessment: History of Falls                                            Pertinent Vitals/Pain Pain Assessment Pain Assessment: Faces Faces Pain Scale: Hurts little more Pain Location: back Pain Descriptors / Indicators: Operative site guarding Pain Intervention(s): Limited activity within patient's tolerance, Monitored during session, Premedicated before session, Repositioned    Home Living Family/patient expects to be discharged to:: Private residence Living Arrangements: Children (only there at night, but not every night; sister here thru Friday) Available Help at Discharge: Family;Friend(s);Available PRN/intermittently Type of Home: House Home Access: Stairs to enter Entrance Stairs-Rails: Right;Left Entrance Stairs-Number of Steps: 5 Alternate Level Stairs-Number of Steps: 15 Home Layout: Two level;Bed/bath upstairs;1/2 bath on main level (moved her bed downstairs) Home Equipment: Educational psychologist (4 wheels);Cane - single point      Prior Function Prior Level of Function : Independent/Modified Independent;Driving;History of Falls (last six months)             Mobility Comments: using Rollator for past couple of months       Extremity/Trunk Assessment   Upper Extremity Assessment Upper Extremity Assessment: Defer to OT evaluation    Lower Extremity Assessment Lower Extremity Assessment: RLE deficits/detail;LLE deficits/detail RLE Deficits / Details: ankle DF 3/5 RLE Sensation: decreased light touch LLE Deficits / Details: DF and PF 0/5; slight toe flexion LLE Sensation: decreased light touch    Cervical / Trunk Assessment Cervical / Trunk Assessment: Back Surgery  Communication   Communication Communication: No apparent difficulties    Cognition Arousal: Alert Behavior During Therapy: Anxious   PT - Cognitive impairments: No apparent impairments                         Following commands: Intact       Cueing Cueing Techniques: Verbal cues, Visual cues      General Comments General comments (skin integrity, edema, etc.): Sister present and very attentive (taking notes)    Exercises     Assessment/Plan    PT Assessment Patient needs continued PT services  PT Problem List Decreased strength;Decreased activity tolerance;Decreased balance;Decreased mobility;Decreased knowledge of use of DME;Decreased knowledge of precautions;Impaired sensation;Pain       PT Treatment Interventions DME instruction;Gait training;Stair training;Functional mobility training;Therapeutic activities;Therapeutic exercise;Patient/family education    PT Goals (Current goals can be found in the Care Plan section)  Acute Rehab PT Goals Patient Stated Goal: learn all she can before she goes home PT Goal Formulation: With patient Time For Goal Achievement: 03/10/24 Potential to Achieve Goals: Good    Frequency Min 3X/week     Co-evaluation               AM-PAC PT 6 Clicks Mobility  Outcome Measure Help needed turning from your back to your side while in a flat bed without using bedrails?: A Lot Help needed moving from lying on your back to sitting on the side of a flat bed without using bedrails?: A Lot Help needed moving to and from a bed to a chair (including a wheelchair)?: A Little  Help needed standing up from a chair using your arms (e.g., wheelchair or bedside chair)?: A Little Help needed to walk in hospital room?: A Little Help needed climbing 3-5 steps with a railing? : A Lot 6 Click Score: 15    End of Session Equipment Utilized During Treatment: Gait belt Activity Tolerance: Patient tolerated treatment well Patient left: in bed;with call bell/phone within reach;with family/visitor present;with SCD's reapplied Nurse Communication: Mobility status PT Visit Diagnosis: Difficulty in walking, not elsewhere classified (R26.2);Pain Pain - part of body:  (back)    Time: 1440-1520 PT Time Calculation (min) (ACUTE ONLY): 40 min   Charges:   PT  Evaluation $PT Eval Low Complexity: 1 Low PT Treatments $Gait Training: 8-22 mins $Self Care/Home Management: 8-22 PT General Charges $$ ACUTE PT VISIT: 1 Visit          Macario RAMAN, PT Acute Rehabilitation Services  Office [phone]   Macario SHAUNNA Soja 02/25/2024, 3:38 PM

## 2024-02-25 NOTE — Progress Notes (Signed)
    Providing Compassionate, Quality Care - Together   NEUROSURGERY PROGRESS NOTE     S: No issues overnight.    O: EXAM:  BP 128/72 (BP Location: Left Arm)   Pulse 63   Temp 98.1 F (36.7 C) (Oral)   Resp 18   Ht 5' 4 (1.626 m)   Wt 91.2 kg   SpO2 95%   BMI 34.50 kg/m     Awake, alert, oriented  Speech fluent, appropriate  BUE/BLE 5/5 w/ exception of known b/l foot drop. L DF 1/5, R DF 3/5 SILTx4 Dressing c/d/i   ASSESSMENT:  63 y.o. s/p L4-5, L5-S1 decompression, POD#1    PLAN: -Therapies as tolerated -Continue supportive care -Likely dc home tmrw after therapy clearance -Call w/ questions/concerns.   Camie Pickle, [facility]

## 2024-02-26 MED ORDER — POLYETHYLENE GLYCOL 3350 17 G PO PACK: 17.0000 g | PACK | Freq: Every day | ORAL | Status: DC | PRN

## 2024-02-26 MED ORDER — ENOXAPARIN SODIUM 40 MG/0.4ML IJ SOSY
40.0000 mg | PREFILLED_SYRINGE | INTRAMUSCULAR | Status: DC
  Administered 2024-02-26 – 2024-02-27 (×2): 40 mg via SUBCUTANEOUS
  Filled 2024-02-26 (×2): qty 0.4

## 2024-02-26 NOTE — Progress Notes (Signed)
 Physical Therapy Treatment Patient Details Name: Sarah Chung MRN: [id number] DOB: 1961-02-26 Today's Date: 02/26/2024   History of Present Illness 63 y.o. female admitted 02/24/24 for foot drop L>R. Onset of symptoms was several months ago, gradually worsening since that time. Pt reports multiple falls due to tripping over left foot. 7/18 L4-S1 decompressive facetectomy and foraminotomies  PMHx: clotting disorder (DVT, PE), CAD, HLD, HTN, seizures    PT Comments  Pt greeted supine in bed, pleasant and agreeable to PT session. Reviewed back precautions and provided pt with handout. She performed bed mobility using log roll technique with intermittent cues for improved sequencing. Pt sat up with supervision for safety and required minA to bring BLE back into bed. She completed transfers from various surfaces using RW with supervision. Pt ambulated within room and hallway using RW with supervision. She compensated for left foot drop by increasing knee flex. Pt would benefit from left [place] foot-up brace. Pt completed 6 steps sideways with BUE support on RW. Will continue to follow acutely and advance appropriately. Recommend HHPT to increase strength, improve endurance, decrease fall risk, and optimize safety within the home environment.      If plan is discharge home, recommend the following: A little help with bathing/dressing/bathroom;Assistance with cooking/housework;Assist for transportation;Help with stairs or ramp for entrance   Can travel by private vehicle        Equipment Recommendations  Rolling walker (2 wheels)    Recommendations for Other Services       Precautions / Restrictions Precautions Precautions: Back Precaution Booklet Issued: Yes (comment) Recall of Precautions/Restrictions: Intact Restrictions Weight Bearing Restrictions Per Provider Order: No     Mobility  Bed Mobility Overal bed mobility: Needs Assistance Bed Mobility: Rolling, Sidelying to Sit, Sit to  Sidelying Rolling: Supervision, Used rails Sidelying to sit: Supervision, Used rails     Sit to sidelying: Min assist, Used rails General bed mobility comments: Pt verbalized log roll technique. Intermittent cues for improved sequencing. Pt sat up on R side of bed with increased time. She bent LLE to help with pushing over. Reliance on bed rail with pt reporting she plans to purchase one for her bed at home. Returning to bed pt required assist with bringing BLE into bed. Recentered herself.    Transfers Overall transfer level: Needs assistance Equipment used: Rolling walker (2 wheels) Transfers: Sit to/from Stand, Bed to chair/wheelchair/BSC Sit to Stand: Supervision   Step pivot transfers: Supervision       General transfer comment: Pt stood from lowest bed height. Demonstrated proper hand placement using RW. Powered up without physical assist. Good eccentric control.    Ambulation/Gait Ambulation/Gait assistance: Supervision, +2 safety/equipment (chair follow) Gait Distance (Feet): 80 Feet (1x40, seated rest, 1x80) Assistive device: Rolling walker (2 wheels) Gait Pattern/deviations: Step-through pattern, Decreased stride length, Steppage, Decreased dorsiflexion - left Gait velocity: reduced Gait velocity interpretation: 1.31 - 2.62 ft/sec, indicative of limited community ambulator   General Gait Details: Pt manuevered with room and hallway well, navigating obstacles, and maintaining proper proximity to RW. She relied on increased WBing through BUE support on RW to offload LEs. Pt has drop foot on LLE, compensates by increasing knee flex to prevent toe drag. No LOB.   Stairs Stairs: Yes Stairs assistance: Supervision Stair Management: One rail Left, Sideways, Step to pattern Number of Stairs: 2 (x3) General stair comments: Pt ascended leading with RLE and descended leading with LLE. She maintained BUE support on unilateral handrail. Discussed how family could support  her with  entering/exiting home.   Wheelchair Mobility     Tilt Bed    Modified Rankin (Stroke Patients Only)       Balance Overall balance assessment: Mild deficits observed, not formally tested, History of Falls                                          Communication Communication Communication: No apparent difficulties  Cognition Arousal: Alert Behavior During Therapy: WFL for tasks assessed/performed, Anxious   PT - Cognitive impairments: No apparent impairments                       PT - Cognition Comments: Pt is a bit nervous during mobility and is concern about potentially violating her back precautions. She asks clarifying questions repeatedly, but demonstrates carryover within session. Following commands: Intact      Cueing Cueing Techniques: Verbal cues, Gestural cues, Visual cues  Exercises      General Comments General comments (skin integrity, edema, etc.): Sister present and supportive throughout session.      Pertinent Vitals/Pain Pain Assessment Pain Assessment: Faces Faces Pain Scale: Hurts little more Pain Location: Back Pain Descriptors / Indicators: Operative site guarding, Discomfort Pain Intervention(s): Monitored during session, Repositioned    Home Living                          Prior Function            PT Goals (current goals can now be found in the care plan section) Acute Rehab PT Goals Patient Stated Goal: Return Home safely Progress towards PT goals: Progressing toward goals    Frequency    Min 3X/week      PT Plan      Co-evaluation              AM-PAC PT 6 Clicks Mobility   Outcome Measure  Help needed turning from your back to your side while in a flat bed without using bedrails?: A Little Help needed moving from lying on your back to sitting on the side of a flat bed without using bedrails?: A Little Help needed moving to and from a bed to a chair (including a wheelchair)?:  A Little Help needed standing up from a chair using your arms (e.g., wheelchair or bedside chair)?: A Little Help needed to walk in hospital room?: A Little Help needed climbing 3-5 steps with a railing? : A Little 6 Click Score: 18    End of Session Equipment Utilized During Treatment: Gait belt Activity Tolerance: Patient tolerated treatment well Patient left: in bed;with call bell/phone within reach;with family/visitor present Nurse Communication: Mobility status PT Visit Diagnosis: Difficulty in walking, not elsewhere classified (R26.2);Pain Pain - part of body:  (Back)     Time: 9249-9181 PT Time Calculation (min) (ACUTE ONLY): 28 min  Charges:    $Gait Training: 23-37 mins PT General Charges $$ ACUTE PT VISIT: 1 Visit                     Randall SAUNDERS, PT, DPT Acute Rehabilitation Services Office: [phone] Secure Chat Preferred  Sarah Chung 02/26/2024, 8:53 AM

## 2024-02-26 NOTE — Plan of Care (Signed)
  Problem: Clinical Measurements: Goal: Ability to maintain clinical measurements within normal limits will improve Outcome: Progressing Goal: Will remain free from infection Outcome: Progressing Goal: Diagnostic test results will improve Outcome: Progressing Goal: Respiratory complications will improve Outcome: Progressing Goal: Cardiovascular complication will be avoided Outcome: Progressing   Problem: Activity: Goal: Risk for activity intolerance will decrease Outcome: Progressing   Problem: Pain Managment: Goal: General experience of comfort will improve and/or be controlled Outcome: Progressing   Problem: Safety: Goal: Ability to remain free from injury will improve Outcome: Progressing

## 2024-02-26 NOTE — Evaluation (Signed)
 Occupational Therapy Evaluation Patient Details Name: Sarah Chung MRN: [id number] DOB: 1961/03/02 Today's Date: 02/26/2024   History of Present Illness   63 y.o. female admitted 02/24/24 for foot drop L>R. Onset of symptoms was several months ago, gradually worsening since that time. Pt reports multiple falls due to tripping over left foot. 7/18 L4-S1 decompressive facetectomy and foraminotomies  PMHx: clotting disorder (DVT, PE), CAD, HLD, HTN, seizures     Clinical Impressions At baseline, pt is Ind to Mod I with ADLs and IADLs and Mod I for functional mobility with use of Rollator. Pt now presents with decreased activity tolerance, decreased knowledge of precautions, decreased knowledge of AE/DME, and decreased safety and independence with functional tasks. Pt currently demonstrates ability to complete ADLs largely Independent to Mod assist with use of AE, bed mobility with Supervision to Min assist, and functional transfers with a RW with Supervision to Contact guard assist, all while adhering to back precautions with occasional cues needed for precautions, technique, and compensatory strategies. Pt participated well in session, is motivated to return to PLOF, and has good family support. Pt limited this session by pain and occasional signs of anxiety related to pt worry about her ability to perform functional tasks while adhering to back precautions. OT offered supportive listening and reassurance. Pt will benefit from acute skilled OT to address deficits and increase safety and independence with functional tasks. Post acute discharge, pt will benefit from assist of family paired with [facility] OT to increase safety in the home and increase safety and independence with functional tasks.      If plan is discharge home, recommend the following:   A little help with walking and/or transfers;A lot of help with bathing/dressing/bathroom;Assistance with cooking/housework;Assist for transportation;Help with  stairs or ramp for entrance     Functional Status Assessment   Patient has had a recent decline in their functional status and demonstrates the ability to make significant improvements in function in a reasonable and predictable amount of time.     Equipment Recommendations   Tub/shower bench;Other (comment) (RW; sock aid, reacher, long handled sponge; long handled shoe horn)     Recommendations for Other Services         Precautions/Restrictions   Precautions Precautions: Back Precaution Booklet Issued: Yes (comment) Recall of Precautions/Restrictions: Intact Precaution/Restrictions Comments: Pt able to state Independently and requiring occaisonal cues to adhere to precautions during tasks Restrictions Weight Bearing Restrictions Per Provider Order: No     Mobility Bed Mobility Overal bed mobility: Needs Assistance Bed Mobility: Rolling, Sidelying to Sit, Sit to Sidelying Rolling: Supervision, Used rails Sidelying to sit: Supervision, Used rails     Sit to sidelying: Min assist, Used rails General bed mobility comments: Pt verbalized log roll technique. Intermittent cues for improved sequencing. Pt sat up on R side of bed with increased time. She bent LLE to help with pushing over. Reliance on bed rail with pt reporting she plans to purchase one for her bed at home. Returning to bed, pt required assist with bringing BLE into bed. Pt required cues for hand placement/technique and limiting use of B UE to pull/push on bed rail to maintain back precautions. Pt with signs of anxiety when addressing bed mobility and transfers this session    Transfers Overall transfer level: Needs assistance Equipment used: Rolling walker (2 wheels) Transfers: Sit to/from Stand, Bed to chair/wheelchair/BSC Sit to Stand: Supervision     Step pivot transfers: Supervision     General transfer comment: cues  for hand placement technique and signs of anxiety when addressing bed mobility  and transfers this session      Balance Overall balance assessment: Mild deficits observed, not formally tested, History of Falls                                         ADL either performed or assessed with clinical judgement   ADL Overall ADL's : Needs assistance/impaired Eating/Feeding: Independent;Sitting   Grooming: Supervision/safety;Standing (adhering to back precautions)   Upper Body Bathing: Minimal assistance;Sitting;Cueing for compensatory techniques;With adaptive equipment (adhering to back precautions)   Lower Body Bathing: Minimal assistance;Moderate assistance;Cueing for compensatory techniques;With adaptive equipment;Sit to/from stand (adhering to back precautions)   Upper Body Dressing : Contact guard assist;Sitting;Cueing for compensatory techniques (adhering to back precautions)   Lower Body Dressing: Minimal assistance;Moderate assistance;With adaptive equipment;Cueing for compensatory techniques;Sit to/from stand (adhering to back precautions)   Toilet Transfer: Supervision/safety;Contact guard assist;Ambulation;BSC/3in1;Rolling walker (2 wheels) (adhering to back precautions; cues for technique/hand placement)   Toileting- Clothing Manipulation and Hygiene: Moderate assistance;Minimal assistance;Cueing for compensatory techniques;Sit to/from stand         General ADL Comments: Pt with signs of anxiety when discussing/trialling compensatory strategies for completing ADLs and IADLs while adhering to back precautions     Vision Baseline Vision/History: 1 Wears glasses Ability to See in Adequate Light: 0 Adequate (with glasses) Patient Visual Report: No change from baseline       Perception         Praxis         Pertinent Vitals/Pain Pain Assessment Pain Assessment: Faces Faces Pain Scale: Hurts even more Pain Location: Back Pain Descriptors / Indicators: Operative site guarding, Discomfort Pain Intervention(s): Limited activity  within patient's tolerance, Monitored during session, Repositioned     Extremity/Trunk Assessment Upper Extremity Assessment Upper Extremity Assessment: Right hand dominant;Overall WFL for tasks assessed (tested within precautions)   Lower Extremity Assessment Lower Extremity Assessment: Defer to PT evaluation   Cervical / Trunk Assessment Cervical / Trunk Assessment: Back Surgery   Communication Communication Communication: No apparent difficulties   Cognition Arousal: Alert Behavior During Therapy: Anxious, WFL for tasks assessed/performed (Largely WFL but with signs of anxiety when discussing bed mobility and performing ADLs/IADLs while adhering to back precautions) Cognition: No apparent impairments             OT - Cognition Comments: Pt AAOx4 and pleasant throughout session. Cognition WFL for tasks assessed.                 Following commands: Intact       Cueing  General Comments   Cueing Techniques: Verbal cues;Gestural cues;Visual cues  Sister and daughter presant and supportive throughout session   Exercises     Shoulder Instructions      Home Living Family/patient expects to be discharged to:: Private residence Living Arrangements: Children (only there at night, but not every night; sister here thru Friday) Available Help at Discharge: Family;Friend(s);Available PRN/intermittently Type of Home: House Home Access: Stairs to enter [employer] of Steps: 5 Entrance Stairs-Rails: Right;Left Home Layout: Two level;Bed/bath upstairs;1/2 bath on main level (has moved her bed downstairs) Alternate Level Stairs-Number of Steps: 15   Bathroom Shower/Tub: Walk-in shower;Tub/shower unit;Other (comment) (pt reports walk-in shower is very narrow; another bathroom as a jacuzzi tub; all upstairs)   Bathroom Toilet: Standard (has toilet safety frame)  Home Equipment: Educational psychologist (4 wheels);Cane - single point;Other (comment) (toilet  safety frame; bidet)   Additional Comments: Family reports they have already ordered a bed transfer handle      Prior Functioning/Environment Prior Level of Function : Independent/Modified Independent;Driving;History of Falls (last six months)             Mobility Comments: using Rollator for past couple of months ADLs Comments: Ind to Mod I with ADLs and IADLs; drives    OT Problem List: Decreased activity tolerance;Decreased knowledge of use of DME or AE;Decreased knowledge of precautions;Pain;Impaired balance (sitting and/or standing)   OT Treatment/Interventions: Self-care/ADL training;DME and/or AE instruction;Energy conservation;Therapeutic activities;Patient/family education      OT Goals(Current goals can be found in the care plan section)   Acute Rehab OT Goals Patient Stated Goal: to return home, heal well, and be able to be independent while adhering to precautions OT Goal Formulation: With patient Time For Goal Achievement: 03/11/24 Potential to Achieve Goals: Good ADL Goals Pt Will Perform Lower Body Bathing: with supervision;sitting/lateral leans;with adaptive equipment (adhering to back precautions) Pt Will Perform Upper Body Dressing: with modified independence;sitting (adhering to back precautions) Pt Will Perform Lower Body Dressing: with modified independence;with adaptive equipment;sit to/from stand (adhering to back precautions) Pt Will Transfer to Toilet: with modified independence;ambulating;regular height toilet;grab bars (with least restrictive AD; adhering to back precautions) Pt Will Perform Toileting - Clothing Manipulation and hygiene: with modified independence;sit to/from stand;with adaptive equipment (adhering to back precautions) Additional ADL Goal #1: Patient will demonstrate ability to perform bed mobility during/in preparation for functional tasks with Mod I while adhering to back precautions. Additional ADL Goal #2: Patient will demonstrate  ability to Independently state 3 energy conservation stategies for increased safety and independence with functional tasks.   OT Frequency:  Min 2X/week    Co-evaluation              AM-PAC OT 6 Clicks Daily Activity     Outcome Measure Help from another person eating meals?: None Help from another person taking care of personal grooming?: A Little Help from another person toileting, which includes using toliet, bedpan, or urinal?: A Lot Help from another person bathing (including washing, rinsing, drying)?: A Lot Help from another person to put on and taking off regular upper body clothing?: A Little Help from another person to put on and taking off regular lower body clothing?: A Lot 6 Click Score: 16   End of Session Equipment Utilized During Treatment: Rolling walker (2 wheels);Other (comment) (sock aid, reacher, long handled sponge, long handled shoe horn) Nurse Communication: Mobility status  Activity Tolerance: Patient tolerated treatment well;Patient limited by pain;Other (comment) (Pt limited by signs of anxiety) Patient left: in bed;with call bell/phone within reach;with family/visitor present  OT Visit Diagnosis: Other abnormalities of gait and mobility (R26.89);Pain;Other (comment) (decreased activity tolerance)                Time: 8557-8441 OT Time Calculation (min): 76 min Charges:  OT General Charges $OT Visit: 1 Visit OT Evaluation $OT Eval Moderate Complexity: 1 Mod OT Treatments $Self Care/Home Management : 38-52 mins $Therapeutic Activity: 8-22 mins  Margarie Rockey HERO., OTR/L, MA Acute Rehab [phone]   Margarie FORBES Horns 02/26/2024, 4:54 PM

## 2024-02-26 NOTE — Progress Notes (Signed)
    Providing Compassionate, Quality Care - Together   NEUROSURGERY PROGRESS NOTE     S: No issues overnight.    O: EXAM:  BP (!) 157/92 (BP Location: Right Arm)   Pulse 67   Temp 97.9 F (36.6 C)   Resp 17   Ht 5' 4 (1.626 m)   Wt 91.2 kg   SpO2 98%   BMI 34.50 kg/m     Awake, alert, oriented  Speech fluent, appropriate  BUE/BLE 5/5 w/ exception of known b/l foot drop. L DF 1/5, R DF 3/5 SILTx4   ASSESSMENT:  63 y.o. s/p L4-5, L5-S1 decompression, POD#2    PLAN: -Continue therapies per their recs, likely dc tmrw.  -AFO ordered -Call w/ questions/concerns.   Camie Pickle, [facility]

## 2024-02-26 NOTE — Progress Notes (Signed)
 Orthopedic Tech Progress Note Patient Details:  Sarah Chung 06-14-1961 [id number]  Routine order for a L foot up brace has been called into [facility].  Patient ID: Sarah Chung, female   DOB: 18-Jan-1961, 63 y.o.   MRN: [id number]  [name] 02/26/2024, 11:14 AM

## 2024-02-27 ENCOUNTER — Encounter (HOSPITAL_COMMUNITY): Payer: Self-pay | Admitting: Neurological Surgery

## 2024-02-27 MED ORDER — HYDROCODONE-ACETAMINOPHEN 5-325 MG PO TABS: 1.0000 | ORAL_TABLET | ORAL | 0 refills | Status: DC | PRN

## 2024-02-27 MED ORDER — METHOCARBAMOL 500 MG PO TABS: 500.0000 mg | ORAL_TABLET | Freq: Three times a day (TID) | ORAL | 1 refills | Status: DC | PRN

## 2024-02-27 MED FILL — Thrombin For Soln 5000 Unit: CUTANEOUS | Qty: 2 | Status: AC

## 2024-02-27 NOTE — Plan of Care (Signed)
  Problem: Clinical Measurements: Goal: Ability to maintain clinical measurements within normal limits will improve Outcome: Progressing Goal: Will remain free from infection Outcome: Progressing Goal: Diagnostic test results will improve Outcome: Progressing Goal: Respiratory complications will improve Outcome: Progressing Goal: Cardiovascular complication will be avoided Outcome: Progressing   Problem: Coping: Goal: Level of anxiety will decrease Outcome: Progressing   Problem: Pain Managment: Goal: General experience of comfort will improve and/or be controlled Outcome: Progressing   Problem: Safety: Goal: Ability to remain free from injury will improve Outcome: Progressing

## 2024-02-27 NOTE — TOC Transition Note (Signed)
 Transition of Care [facility]) - Discharge Note   Patient Details  Name: Sarah Chung MRN: [id number] Date of Birth: 1961-04-01  [facility] CM/SW Contact:  Rosalva Jon Bloch, RN Phone Number: 02/27/2024, 4:35 PM   Clinical Narrative:    Patient will DC to: Anticipated DC date: Family notified: Transport by: - S/p L4-S1 decompressive facetectomy and foraminotomies  PMHx: clotting disorder (DVT, PE), CAD, HLD, HTN, seizures   Per MD patient ready for DC today. RN, patient, and patient's family notified of DC. Sister to assist with care once home.  Pt agreeable to home health services. Pt without provider preference. Pt without insurance. Self employed . Pt states will to pay for home health services. Referral made with Amy/ [facility] for private pay status. Amy states pretty certain they will accept. Amy to f/u with pt by am, pt made aware. DME RW will be delivered to bedside prior to d/c by [facility].  Pt without  RX med concerns.  Post hospital f/u noted on AVS.  RNCM will sign off for now as intervention is no longer needed. Please consult us  again if new needs arise.    Final next level of care: Home w Home Health Services Barriers to Discharge: No Barriers Identified   Patient Goals and CMS Choice     Choice offered to / list presented to : Patient      Discharge Placement                       Discharge Plan and Services Additional resources added to the After Visit Summary for                  DME Arranged: Walker rolling   Date DME Agency Contacted: 02/27/24 Time DME Agency Contacted: [phone] Representative spoke with at DME Agency: Darlyn HH Arranged: PT HH Agency: [facility] Date [facility] Agency Contacted: 02/27/24 Time HH Agency Contacted: [phone] Representative spoke with at [facility] Agency: Amy  Social Drivers of Health (SDOH) Interventions SDOH Screenings   Food Insecurity: No Food Insecurity (02/25/2024)  Housing: Low Risk  (02/25/2024)   Transportation Needs: No Transportation Needs (02/25/2024)  Utilities: Patient Declined (02/25/2024)  Depression (PHQ2-9): Low Risk  (11/11/2023)  Financial Resource Strain: Low Risk  (10/19/2023)  Physical Activity: Unknown (10/19/2023)  Social Connections: Unknown (10/19/2023)  Stress: Stress Concern Present (10/19/2023)  Tobacco Use: Low Risk  (02/24/2024)     Readmission Risk Interventions     No data to display

## 2024-02-27 NOTE — Progress Notes (Signed)
 Occupational Therapy Treatment Patient Details Name: Sarah Chung MRN: [id number] DOB: March 10, 1961 Today's Date: 02/27/2024   History of present illness 63 y.o. female admitted 02/24/24 for foot drop L>R. Onset of symptoms was several months ago, gradually worsening since that time. Pt reports multiple falls due to tripping over left foot. 7/18 L4-S1 decompressive facetectomy and foraminotomies  PMHx: clotting disorder (DVT, PE), CAD, HLD, HTN, seizures   OT comments  Pt in bed upon therapy arrival with sister visiting. Hand off with PTA. Pt educated on use of AE to increase functional performance during LB ADL tasks. Pt able to demonstrate and/or verbalize understanding. VC provided for technique as needed. Discussed set up of bathroom with pt verbalizing that tub bench and long handled shower head have been purchased. Pt educated on use of shower head holder for tub wall if needed. At all questions answered during session. Plan is to discharge home with assist today. Acute OT will continue to work with pt if discharge is delayed. Pt continues to be appropriate for follow up home health OT services.        If plan is discharge home, recommend the following:  A little help with walking and/or transfers;A little help with bathing/dressing/bathroom;Assistance with cooking/housework;Assist for transportation;Help with stairs or ramp for entrance   Equipment Recommendations  Tub/shower bench (Pt purchased herself)       Precautions / Restrictions Precautions Precautions: Back Precaution Booklet Issued: No (provided at evaluation) Recall of Precautions/Restrictions: Intact Restrictions Weight Bearing Restrictions Per Provider Order: No       Mobility Bed Mobility Overal bed mobility: Needs Assistance Bed Mobility: Rolling, Sidelying to Sit Rolling: Modified independent (Device/Increase time), Used rails Sidelying to sit: Used rails, Supervision       General bed mobility comments: pt  able to demonstrate bed mobility with improved functional performance while adhering to back precautions. VC provided for log roll technique. Also discussed that sleeping in recliner is acceptable initially when home if needed.    Transfers  General transfer comment: Pt finished with PT prior to OT arrival. See PT note for transfer level.     Balance Overall balance assessment: History of Falls         ADL either performed or assessed with clinical judgement   ADL       Lower Body Dressing: Supervision/safety;With adaptive equipment;Sitting/lateral leans;Adhering to back precautions      General ADL Comments: Pt provided education as needed for use of AE to increase functional performance during bathing and dressing using reacher, sock aid, long handled shoe horn, and long handled sponge. Pt able to demonstrate/verbalize understanding.     Communication Communication Communication: No apparent difficulties   Cognition Arousal: Alert Behavior During Therapy: WFL for tasks assessed/performed Cognition: No apparent impairments        Following commands: Intact        Cueing   Cueing Techniques: Verbal cues        General Comments Sister present during session and supportive.    Pertinent Vitals/ Pain       Pain Assessment Pain Assessment: Faces Faces Pain Scale: Hurts a little bit Pain Location: Back Pain Descriptors / Indicators: Operative site guarding, Discomfort Pain Intervention(s): Monitored during session   Frequency  Min 2X/week        Progress Toward Goals  OT Goals(current goals can now be found in the care plan section)  Progress towards OT goals: Progressing toward goals  ADL Goals Additional ADL Goal #1:  (  goal met 7/21)         AM-PAC OT 6 Clicks Daily Activity     Outcome Measure   Help from another person eating meals?: None Help from another person taking care of personal grooming?: None Help from another person toileting, which  includes using toliet, bedpan, or urinal?: None Help from another person bathing (including washing, rinsing, drying)?: A Little Help from another person to put on and taking off regular upper body clothing?: None Help from another person to put on and taking off regular lower body clothing?: A Little 6 Click Score: 22    End of Session Equipment Utilized During Treatment: Other (comment) (reacher, sock aid, long handled sponge, long handled shoe horn.)  OT Visit Diagnosis: Other abnormalities of gait and mobility (R26.89);Pain Pain - part of body:  (back)   Activity Tolerance Patient tolerated treatment well   Patient Left in bed;with call bell/phone within reach;with family/visitor present           Time: 8981-8954 OT Time Calculation (min): 27 min  Charges: OT General Charges $OT Visit: 1 Visit OT Treatments $Self Care/Home Management : 23-37 mins  Leita Howell, OTR/L,CBIS  Supplemental OT - [facility] and [facility] Secure Chat Preferred    Smitty Ackerley, Leita BIRCH 02/27/2024, 1:28 PM

## 2024-02-27 NOTE — Progress Notes (Signed)
 Physical Therapy Treatment Patient Details Name: Sarah Chung MRN: [id number] DOB: 09/16/1960 Today's Date: 02/27/2024   History of Present Illness 63 y.o. female admitted 02/24/24 for foot drop L>R. Onset of symptoms was several months ago, gradually worsening since that time. Pt reports multiple falls due to tripping over left foot. 7/18 L4-S1 decompressive facetectomy and foraminotomies  PMHx: clotting disorder (DVT, PE), CAD, HLD, HTN, seizures    PT Comments  Pt seen for second session as Ossur foot-up brace delivered. Pt educated on brace application/wear schedule with pt needing total A to donn while maintaining back precautions. Pt unable to achieve figure four pattern with LLE to assist. Pt sister present throughout education with pt and sister and verbalizing and demonstrating understanding of donning/doffing. Pt continues to benefit from skilled PT services to progress toward functional mobility goals.     If plan is discharge home, recommend the following: A little help with bathing/dressing/bathroom;Assistance with cooking/housework;Assist for transportation;Help with stairs or ramp for entrance   Can travel by private vehicle        Equipment Recommendations  Rolling walker (2 wheels)    Recommendations for Other Services       Precautions / Restrictions Precautions Precautions: Back Precaution Booklet Issued: No (provided at evaluation) Recall of Precautions/Restrictions: Intact Precaution/Restrictions Comments: Pt able to state Independently and requiring occaisonal cues to adhere to precautions during tasks Required Braces or Orthoses:  (none) Restrictions Weight Bearing Restrictions Per Provider Order: No     Mobility  Bed Mobility Overal bed mobility: Needs Assistance Bed Mobility: Rolling, Sidelying to Sit, Sit to Sidelying Rolling: Modified independent (Device/Increase time), Used rails Sidelying to sit: Min assist     Sit to sidelying: Used rails,  Supervision General bed mobility comments: pt up in chair on arrival    Transfers Overall transfer level: Needs assistance Equipment used: Rolling walker (2 wheels) Transfers: Sit to/from Stand, Bed to chair/wheelchair/BSC Sit to Stand: Supervision           General transfer comment: deferred for education on brace    Ambulation/Gait Ambulation/Gait assistance: Supervision, +2 safety/equipment (chair follow) Gait Distance (Feet): 180 Feet Assistive device: Rolling walker (2 wheels) Gait Pattern/deviations: Step-through pattern, Decreased stride length, Steppage, Decreased dorsiflexion - left Gait velocity: reduced     General Gait Details: Pt manuevered with room and hallway well, navigating obstacles, and maintaining proper proximity to RW. Pt has drop foot on LLE, compensates by increasing knee flex to prevent toe drag. No LOB.   Stairs         General stair comments: pt declining strait training this session, good recall for sideways technique from previsou session   Wheelchair Mobility     Tilt Bed    Modified Rankin (Stroke Patients Only)       Balance Overall balance assessment: History of Falls                                          Communication Communication Communication: No apparent difficulties  Cognition Arousal: Alert Behavior During Therapy: WFL for tasks assessed/performed   PT - Cognitive impairments: No apparent impairments                       PT - Cognition Comments: some contineud anxiousness pending d/c Following commands: Intact      Cueing Cueing Techniques: Verbal cues  Exercises Other  Exercises Other Exercises: serial sit<>stand x7 for reiforcement of hand placement, anterior weight shift and hip hinge to safely sit    General Comments General comments (skin integrity, edema, etc.): session focused on education and donning/doffing Ossur foot-up brace with sister presnt with both pt and sister  vabalizing understanidng      Pertinent Vitals/Pain Pain Assessment Pain Assessment: No/denies pain Pain Intervention(s): Monitored during session    Home Living                          Prior Function            PT Goals (current goals can now be found in the care plan section) Acute Rehab PT Goals Patient Stated Goal: Return Home safely PT Goal Formulation: With patient Time For Goal Achievement: 03/10/24 Progress towards PT goals: Progressing toward goals    Frequency    Min 3X/week      PT Plan      Co-evaluation              AM-PAC PT 6 Clicks Mobility   Outcome Measure  Help needed turning from your back to your side while in a flat bed without using bedrails?: A Little Help needed moving from lying on your back to sitting on the side of a flat bed without using bedrails?: A Little Help needed moving to and from a bed to a chair (including a wheelchair)?: A Little Help needed standing up from a chair using your arms (e.g., wheelchair or bedside chair)?: A Little Help needed to walk in hospital room?: A Little Help needed climbing 3-5 steps with a railing? : A Little 6 Click Score: 18    End of Session Equipment Utilized During Treatment: Gait belt Activity Tolerance: Patient tolerated treatment well Patient left: with call bell/phone within reach;with family/visitor present;in chair Nurse Communication: Mobility status PT Visit Diagnosis: Difficulty in walking, not elsewhere classified (R26.2);Pain Pain - part of body:  (Back)     Time: 1351-1406 PT Time Calculation (min) (ACUTE ONLY): 15 min  Charges:    $Gait Training: 23-37 mins $Self Care/Home Management: 8-22 PT General Charges $$ ACUTE PT VISIT: 1 Visit                     Houa Nie R. PTA Acute Rehabilitation Services Office: [phone]   [name] 02/27/2024, 4:29 PM

## 2024-02-27 NOTE — Discharge Summary (Signed)
 Physician Discharge Summary  Patient ID: Sarah Chung MRN: [id number] DOB/AGE: Dec 15, 1960 63 y.o.  Admit date: 02/24/2024 Discharge date: 02/27/2024  Admission Diagnoses: Spinal stenosis with foot drop    Discharge Diagnoses: Same   Discharged Condition: good  Hospital Course: The patient was admitted on 02/24/2024 and taken to the operating room where the patient underwent lumbar laminectomy L4-5 L5-S1. The patient tolerated the procedure well and was taken to the recovery room and then to the floor in stable condition. The hospital course was routine. There were no complications. The wound remained clean dry and intact. Pt had appropriate back soreness. No complaints of leg pain or new N/T/W. The patient remained afebrile with stable vital signs, and tolerated a regular diet. The patient continued to increase activities, and pain was well controlled with oral pain medications.  There was no change in her preoperative foot drop upon discharge as expected  Consults: None  Significant Diagnostic Studies:  Results for orders placed or performed during the hospital encounter of 02/13/24  Basic metabolic panel with GFR   Collection Time: 02/13/24  8:00 AM  Result Value Ref Range   Sodium 138 135 - 145 mmol/L   Potassium 3.9 3.5 - 5.1 mmol/L   Chloride 101 98 - 111 mmol/L   CO2 24 22 - 32 mmol/L   Glucose, Bld 89 70 - 99 mg/dL   BUN 26 (H) 8 - 23 mg/dL   Creatinine, Ser 8.90 (H) 0.44 - 1.00 mg/dL   Calcium  9.4 8.9 - 10.3 mg/dL   GFR, Estimated 57 (L) >60 mL/min   Anion gap 13 5 - 15    DG Lumbar Spine 2-3 Views Result Date: 02/24/2024 CLINICAL DATA:  L4-5 laminectomy and 4 mm min item E EXAM: LUMBAR SPINE - 2-3 VIEW COMPARISON:  January 26, 2024 FLUOROSCOPY: Exposure Index (as provided by the fluoroscopic device): 4.1 mGy Kerma Fluoroscopy time: 0 minutes 6 seconds FINDINGS: Spot lateral view fluoroscopic images of the lower lumbar spine. Radiodense marker and surgical instrument project  over the posterior aspect of the L4 vertebra. Please refer to dedicated operative report for further details. IMPRESSION: Limited intraoperative lateral views of the lower lumbar spine with marker projecting over the posterior aspect of the L4 vertebra. Please refer to dedicated operative report for further details. Electronically Signed   By: Michaeline Blanch M.D.   On: 02/24/2024 13:56   DG C-Arm 1-60 Min-No Report Result Date: 02/24/2024 Fluoroscopy was utilized by the requesting physician.  No radiographic interpretation.   IR IVC FILTER PLMT / S&I PORTER GUID/MOD SED Result Date: 02/13/2024 INDICATION: 63 year old with history of DVT and takes Xarelto . Patient is scheduled for lumbar spine surgery. An IVC filter is needed for the perioperative period when she is off Xarelto . EXAM: IVC FILTER PLACEMENT; IVC VENOGRAM; ULTRASOUND FOR VASCULAR ACCESS Physician: Juliene SAUNDERS. Philip, MD MEDICATIONS: 4 mg Zofran  was given due to history of nausea associated with iodinated contrast. ANESTHESIA/SEDATION: Moderate (conscious) sedation was employed during this procedure. A total of Versed  3 mg and fentanyl  100 mcg was administered intravenously at the order of the provider performing the procedure. Total intra-service moderate sedation time: 28 minutes. Patient's level of consciousness and vital signs were monitored continuously by radiology nurse throughout the procedure under the supervision of the provider performing the procedure. CONTRAST:  45 mL Omnipaque  300 FLUOROSCOPY: Radiation Exposure Index (as provided by the fluoroscopic device): 38 mGy Kerma COMPLICATIONS: None immediate. PROCEDURE: Informed consent was obtained for an IVC venogram and filter  placement. Ultrasound demonstrated a patent right internal jugular vein. Ultrasound images were obtained for documentation. The right neck was prepped and draped in a sterile fashion. Maximal barrier sterile technique was utilized including caps, mask, sterile gowns, sterile  gloves, sterile drape, hand hygiene and skin antiseptic. The skin was anesthetized with 1% lidocaine . A 21 gauge needle was directed into the vein with ultrasound guidance and a micropuncture dilator set was placed. A wire was advanced into the IVC. The filter sheath was advanced over the wire into the IVC. An IVC venogram was performed. Fluoroscopic images were obtained for documentation. A Bard Denali filter was deployed below the lowest renal vein. A follow-up venogram was performed and the vascular sheath was removed with manual compression. FINDINGS: IVC was patent. Bilateral renal veins were identified. The filter was deployed below the lowest renal vein. Follow-up venogram confirmed placement within the IVC and below the renal veins. IMPRESSION: Successful placement of a retrievable IVC filter. PLAN: This IVC filter is potentially retrievable. The patient will be assessed for filter retrieval by Interventional Radiology. Further recommendations regarding filter retrieval, continued surveillance or declaration of device permanence, will be made at that time. Electronically Signed   By: Juliene Balder M.D.   On: 02/13/2024 21:40   IR Radiologist Eval & Mgmt Result Date: 02/02/2024 : See note in Epic. Electronically Signed   By: Juliene Balder M.D.   On: 02/02/2024 15:58    Antibiotics:  Anti-infectives (From admission, onward)    Start     Dose/Rate Route Frequency Ordered Stop   02/24/24 1945  ceFAZolin  (ANCEF ) IVPB 2g/100 mL premix        2 g 200 mL/hr over 30 Minutes Intravenous Every 8 hours 02/24/24 1857 02/25/24 1651   02/24/24 0945  ceFAZolin  (ANCEF ) IVPB 2g/100 mL premix        2 g 200 mL/hr over 30 Minutes Intravenous On call to O.R. 02/24/24 0942 02/24/24 1240       Discharge Exam: Blood pressure (!) 159/81, pulse (!) 59, temperature 97.9 F (36.6 C), temperature source Oral, resp. rate 16, height 5' 4 (1.626 m), weight 91.2 kg, SpO2 97%. Neurologic: Grossly normal except for bilateral  foot drop stable from preop Incision clean dry and intact  Discharge Medications:   Allergies as of 02/27/2024       Reactions   Tegretol [carbamazepine] Hives   Ace Inhibitors Cough   Iodinated Contrast Media Nausea Only   Keppra [levetiracetam]    Feels outside her body.SABRAloopy   Ketorolac Tromethamine    Sulfa Antibiotics Hives   Lisinopril -hydrochlorothiazide  Cough        Medication List     STOP taking these medications    diazepam  5 MG tablet Commonly known as: Valium        TAKE these medications    ALPRAZolam  1 MG tablet Commonly known as: XANAX  Take 1 tablet (1 mg total) by mouth at bedtime as needed.   ezetimibe  10 MG tablet Commonly known as: ZETIA  TAKE 1 TABLET(10 MG) BY MOUTH DAILY   HYDROcodone -acetaminophen  5-325 MG tablet Commonly known as: NORCO/VICODIN Take 1 tablet by mouth every 4 (four) hours as needed for moderate pain (pain score 4-6).   methocarbamol  500 MG tablet Commonly known as: ROBAXIN  Take 1 tablet (500 mg total) by mouth every 8 (eight) hours as needed for muscle spasms.   olmesartan -hydrochlorothiazide  40-25 MG tablet Commonly known as: Benicar  HCT Take 1 tablet by mouth daily.   PHENobarbital  64.8 MG tablet Commonly known  as: LUMINAL Take 2 tablets (129.6 mg total) by mouth at bedtime.   Xarelto  20 MG Tabs tablet Generic drug: rivaroxaban  TAKE 1 TABLET(20 MG) BY MOUTH DAILY WITH SUPPER        Disposition: home   Final Dx: Lum lam for stenosis  Discharge Instructions     Call MD for:  difficulty breathing, headache or visual disturbances   Complete by: As directed    Call MD for:  persistant nausea and vomiting   Complete by: As directed    Call MD for:  redness, tenderness, or signs of infection (pain, swelling, redness, odor or green/yellow discharge around incision site)   Complete by: As directed    Call MD for:  severe uncontrolled pain   Complete by: As directed    Call MD for:  temperature >100.4    Complete by: As directed    Diet - low sodium heart healthy   Complete by: As directed    Increase activity slowly   Complete by: As directed    No wound care   Complete by: As directed           Signed: Alm GORMAN Molt 02/27/2024, 1:05 PM

## 2024-02-27 NOTE — Progress Notes (Signed)
 AVS and discharge teaching completed.  Family/Ride at bedside.  No PIV.  Patient given time to ask questions.  Currently waiting for [facility] and DME.  Patient requested to take a shower prior to discharge.  Nurse at bedside.

## 2024-02-27 NOTE — Progress Notes (Signed)
 Physical Therapy Treatment Patient Details Name: Sarah Chung MRN: [id number] DOB: 05-02-1961 Today's Date: 02/27/2024   History of Present Illness 63 y.o. female admitted 02/24/24 for foot drop L>R. Onset of symptoms was several months ago, gradually worsening since that time. Pt reports multiple falls due to tripping over left foot. 7/18 L4-S1 decompressive facetectomy and foraminotomies  PMHx: clotting disorder (DVT, PE), CAD, HLD, HTN, seizures    PT Comments  Pt resting in bed on arrival, pleasant and agreeable to session with continued progress towards acute goals. Pt able to recall all precautions and demonstrating fair adherence throughout session, needing light cues to maintain with return to supine at end of session. Pt demonstrating bed mobility, transfers and gait with RW for support with grossly CGA for safety fading to supervision. Pt continues to have steppage gait pattern on L due to foot drop, noted that brace ordered but not delivered at time of this session. Continued education on precautions, appropriate activity progression, and car transfer, and ice with pt verbalizing understanding. Pt continues to benefit from skilled PT services to progress toward functional mobility goals.       If plan is discharge home, recommend the following: A little help with bathing/dressing/bathroom;Assistance with cooking/housework;Assist for transportation;Help with stairs or ramp for entrance   Can travel by private vehicle        Equipment Recommendations  Rolling walker (2 wheels)    Recommendations for Other Services       Precautions / Restrictions Precautions Precautions: Back Precaution Booklet Issued: No (provided at evaluation) Recall of Precautions/Restrictions: Intact Precaution/Restrictions Comments: Pt able to state Independently and requiring occaisonal cues to adhere to precautions during tasks Required Braces or Orthoses:  (none) Restrictions Weight Bearing  Restrictions Per Provider Order: No     Mobility  Bed Mobility Overal bed mobility: Needs Assistance Bed Mobility: Rolling, Sidelying to Sit, Sit to Sidelying Rolling: Modified independent (Device/Increase time), Used rails Sidelying to sit: Min assist     Sit to sidelying: Used rails, Supervision General bed mobility comments: light min A to elevate trunk from sidelying, good technique with log rolling, able to return to sidelying with supervision for safety    Transfers Overall transfer level: Needs assistance Equipment used: Rolling walker (2 wheels) Transfers: Sit to/from Stand, Bed to chair/wheelchair/BSC Sit to Stand: Supervision           General transfer comment: cues for hand placement and anterior weight shift over toes on rise    Ambulation/Gait Ambulation/Gait assistance: Supervision, +2 safety/equipment (chair follow) Gait Distance (Feet): 180 Feet Assistive device: Rolling walker (2 wheels) Gait Pattern/deviations: Step-through pattern, Decreased stride length, Steppage, Decreased dorsiflexion - left Gait velocity: reduced     General Gait Details: Pt manuevered with room and hallway well, navigating obstacles, and maintaining proper proximity to RW. Pt has drop foot on LLE, compensates by increasing knee flex to prevent toe drag. No LOB.   Stairs         General stair comments: pt declining strait training this session, good recall for sideways technique from previsou session   Wheelchair Mobility     Tilt Bed    Modified Rankin (Stroke Patients Only)       Balance Overall balance assessment: History of Falls  Communication Communication Communication: No apparent difficulties  Cognition Arousal: Alert Behavior During Therapy: WFL for tasks assessed/performed   PT - Cognitive impairments: No apparent impairments                       PT - Cognition Comments: Pt is a  bit nervous during mobility and is concern about potentially violating her back precautions. She asks clarifying questions repeatedly, but demonstrates carryover within session. Following commands: Intact      Cueing Cueing Techniques: Verbal cues  Exercises Other Exercises Other Exercises: serial sit<>stand x7 for reiforcement of hand placement, anterior weight shift and hip hinge to safely sit    General Comments General comments (skin integrity, edema, etc.): sister present and supportive throughout session      Pertinent Vitals/Pain Pain Assessment Pain Assessment: No/denies pain Pain Intervention(s): Monitored during session    Home Living                          Prior Function            PT Goals (current goals can now be found in the care plan section) Acute Rehab PT Goals Patient Stated Goal: Return Home safely PT Goal Formulation: With patient Time For Goal Achievement: 03/10/24 Progress towards PT goals: Progressing toward goals    Frequency    Min 3X/week      PT Plan      Co-evaluation              AM-PAC PT 6 Clicks Mobility   Outcome Measure  Help needed turning from your back to your side while in a flat bed without using bedrails?: A Little Help needed moving from lying on your back to sitting on the side of a flat bed without using bedrails?: A Little Help needed moving to and from a bed to a chair (including a wheelchair)?: A Little Help needed standing up from a chair using your arms (e.g., wheelchair or bedside chair)?: A Little Help needed to walk in hospital room?: A Little Help needed climbing 3-5 steps with a railing? : A Little 6 Click Score: 18    End of Session Equipment Utilized During Treatment: Gait belt Activity Tolerance: Patient tolerated treatment well Patient left: in bed;with call bell/phone within reach;with family/visitor present Nurse Communication: Mobility status PT Visit Diagnosis: Difficulty in  walking, not elsewhere classified (R26.2);Pain Pain - part of body:  (Back)     Time: 9057-8981 PT Time Calculation (min) (ACUTE ONLY): 36 min  Charges:    $Gait Training: 23-37 mins PT General Charges $$ ACUTE PT VISIT: 1 Visit                     Sarah Chung R. PTA Acute Rehabilitation Services Office: [phone]   Sarah Chung 02/27/2024, 2:16 PM

## 2024-02-27 NOTE — Progress Notes (Signed)
Walker delivered to bedside.

## 2024-02-27 NOTE — Progress Notes (Signed)
 Pt discharge with all personal belongings including walker, sister transporting home, Garnetta had no questions, wheelchair to lobby by staff.

## 2024-03-02 NOTE — Anesthesia Postprocedure Evaluation (Signed)
 Anesthesia Post Note  Patient: Sarah Chung  Procedure(s) Performed: Laminectomy and Foraminotomy - Lumbar Four-Lumbar Five - Lumbar Five-Sacral One (Back)     Patient location during evaluation: PACU Anesthesia Type: General Level of consciousness: awake and alert Pain management: pain level controlled Vital Signs Assessment: post-procedure vital signs reviewed and stable Respiratory status: spontaneous breathing, nonlabored ventilation and respiratory function stable Cardiovascular status: blood pressure returned to baseline and stable Postop Assessment: no apparent nausea or vomiting Anesthetic complications: no   No notable events documented.                 Jaelie Aguilera

## 2024-03-09 ENCOUNTER — Other Ambulatory Visit: Payer: Self-pay | Admitting: Neurological Surgery

## 2024-03-09 DIAGNOSIS — Z95828 Presence of other vascular implants and grafts: Secondary | ICD-10-CM

## 2024-03-12 ENCOUNTER — Inpatient Hospital Stay: Payer: Self-pay | Admitting: Family Medicine

## 2024-03-14 ENCOUNTER — Ambulatory Visit
Admission: RE | Admit: 2024-03-14 | Discharge: 2024-03-14 | Disposition: A | Discharge status: Home / Self Care | Source: Ambulatory Visit | Attending: Neurological Surgery | Admitting: Neurological Surgery

## 2024-03-14 ENCOUNTER — Other Ambulatory Visit (HOSPITAL_COMMUNITY): Payer: Self-pay | Admitting: Diagnostic Radiology

## 2024-03-14 ENCOUNTER — Other Ambulatory Visit: Payer: Self-pay

## 2024-03-14 DIAGNOSIS — Z7901 Long term (current) use of anticoagulants: Secondary | ICD-10-CM

## 2024-03-14 DIAGNOSIS — Z95828 Presence of other vascular implants and grafts: Secondary | ICD-10-CM

## 2024-03-14 DIAGNOSIS — I2692 Saddle embolus of pulmonary artery without acute cor pulmonale: Secondary | ICD-10-CM

## 2024-03-14 HISTORY — PX: IR RADIOLOGIST EVAL & MGMT: IMG5224

## 2024-03-14 NOTE — Progress Notes (Signed)
 Patient ID: Sarah Chung, female   DOB: 08/12/1960, 63 y.o.   MRN: [id number]       Chief Complaint: The patient is seen in follow up today s/p IVC filter placement   Referring physician: Alm Molt, MD  History of present illness:  Sarah Chung is a 63 y.o. female with history of HTN, HLD, PE and DVT who has been asked to take lifelong Xarelto . In July, IR was consulted for IVC filter placement to allow he to be off of the Xarelto  during perioperative period for spine surgery. Placed IVC filter Sarah Chung) below renal veins on 02/13/24. Dr. Molt completed lumbar decompression 7/18, and the patient has since restarted her Xarelto  7/21.   She returns today to clinic to discuss IVC retrieval as planned and for BLE US . Patient is doing well. She has resumed anticoagulation without any complications.  She would like to have the filter removed as soon as possible.  Past Medical History:  Diagnosis Date   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   Allergy    Anxiety    Calculus of gallbladder with acute cholecystitis, without mention of obstruction 07/03/2013   Clotting disorder (HCC)    Coronary artery disease    Depression    DVT (deep venous thrombosis) (HCC)    LLE post-c-section 1993, unprovoked RLE 02/2019   Gallstones    Hyperlipidemia    Hypertension    Pulmonary embolism (HCC) 02/08/2019   Seizures (HCC)     Past Surgical History:  Procedure Laterality Date   CHOLECYSTECTOMY N/A 07/02/2013   Procedure: LAPAROSCOPIC CHOLECYSTECTOMY WITH INTRAOPERATIVE CHOLANGIOGRAM;  Surgeon: Elon CHRISTELLA Pacini, MD;  Location: [facility];  Service: General;  Laterality: N/A;   IR IVC FILTER PLMT / S&I PORTER GUID/MOD SED  02/13/2024   IR RADIOLOGIST EVAL & MGMT  02/02/2024   LEFT HEART CATH AND CORONARY ANGIOGRAPHY N/A 04/20/2019   Procedure: LEFT HEART CATH AND CORONARY ANGIOGRAPHY;  Surgeon: Claudene Victory ORN, MD;  Location: [facility];  Service: Cardiovascular;  Laterality: N/A;   LUMBAR  LAMINECTOMY/DECOMPRESSION MICRODISCECTOMY N/A 02/24/2024   Procedure: Laminectomy and Foraminotomy - Lumbar Four-Lumbar Five - Lumbar Five-Sacral One;  Surgeon: [name], MD;  Location: [facility] OR;  Service: Neurosurgery;  Laterality: N/A;  Laminectomy and Foraminotomy  - L4-L5 - L5-S1    Allergies: Tegretol [carbamazepine], Ace inhibitors, Iodinated contrast media, Keppra [levetiracetam], Ketorolac tromethamine, Sulfa antibiotics, and Lisinopril -hydrochlorothiazide   Medications: Prior to Admission medications   Medication Sig Start Date End Date Taking? Authorizing Provider  ALPRAZolam  (XANAX ) 1 MG tablet Take 1 tablet (1 mg total) by mouth at bedtime as needed. Patient not taking: Reported on 12/30/2023 12/14/23   Dohmeier, Dedra, MD  ezetimibe  (ZETIA ) 10 MG tablet TAKE 1 TABLET(10 MG) BY MOUTH DAILY Patient not taking: Reported on 12/14/2023 03/11/23   Okey Vina GAILS, MD  HYDROcodone -acetaminophen  (NORCO/VICODIN) 5-325 MG tablet Take 1 tablet by mouth every 4 (four) hours as needed for moderate pain (pain score 4-6). 02/27/24   [name], MD  methocarbamol  (ROBAXIN ) 500 MG tablet Take 1 tablet (500 mg total) by mouth every 8 (eight) hours as needed for muscle spasms. 02/27/24   [name], MD  olmesartan -hydrochlorothiazide  (BENICAR  HCT) 40-25 MG tablet Take 1 tablet by mouth daily. 08/04/23   Jodie Lavern CROME, MD  PHENobarbital  (LUMINAL) 64.8 MG tablet Take 2 tablets (129.6 mg total) by mouth at bedtime. 11/08/23   Onita Duos, MD  XARELTO  20 MG TABS tablet TAKE 1 TABLET(20 MG) BY  MOUTH DAILY WITH SUPPER 05/09/23   Okey Vina GAILS, MD     Family History  Problem Relation Age of Onset   Heart attack Father    Parkinson's disease Father    Heart disease Father    Heart disease Brother    Heart attack Brother    Mental illness Mother    Yvone' disease Mother    Depression Sister    Kidney disease Maternal Grandfather    Heart attack Paternal Grandmother     Social History    Socioeconomic History   Marital status: Divorced    Spouse name: [name]   Number of children: 2   Years of education: College   Highest education level: Bachelor's degree (e.g., BA, AB, BS)  Occupational History   Occupation: Musician: [employer]  Tobacco Use   Smoking status: Never   Smokeless tobacco: Never  Vaping Use   Vaping status: Never Used  Substance and Sexual Activity   Alcohol use: No   Drug use: No   Sexual activity: Yes    Birth control/protection: Post-menopausal  Other Topics Concern   Not on file  Social History Narrative   Patient is married ([name]) and lives at home with his husband.   Patient has two adult children.   Patient is working full-time.   Patient has a college education.   Patient is right-handed.   Patient drinks two cups of coffee and some soda.   Social Drivers of Corporate investment banker Strain: Low Risk  (10/19/2023)   Overall Financial Resource Strain (CARDIA)    Difficulty of Paying Living Expenses: Not hard at all  Food Insecurity: No Food Insecurity (02/25/2024)   Hunger Vital Sign    Worried About Running Out of Food in the Last Year: Never true    Ran Out of Food in the Last Year: Never true  Transportation Needs: No Transportation Needs (02/25/2024)   PRAPARE - Administrator, Civil Service (Medical): No    Lack of Transportation (Non-Medical): No  Physical Activity: Unknown (10/19/2023)   Exercise Vital Sign    Days of Exercise per Week: Patient declined    Minutes of Exercise per Session: Not on file  Stress: Stress Concern Present (10/19/2023)   [employer] of Occupational Health - Occupational Stress Questionnaire    Feeling of Stress : To some extent  Social Connections: Unknown (10/19/2023)   Social Connection and Isolation Panel    Frequency of Communication with Friends and Family: More than three times a week    Frequency of Social Gatherings with Friends and Family: Three  times a week    Attends Religious Services: More than 4 times per year    Active Member of Clubs or Organizations: Patient declined    Attends Banker Meetings: Not on file    Marital Status: Divorced     Vital Signs: BP 137/87 (BP Location: Left Arm)   Pulse 67   Temp (!) 97.4 F (36.3 C) (Oral)   Resp 17   SpO2 96%   Physical Exam Constitutional:      Appearance: She is not ill-appearing.  Pulmonary:     Effort: Pulmonary effort is normal.  Skin:    Comments: Right jugular incision is well healed from IVC filter placement.   Neurological:     Mental Status: She is alert.     Imaging: Narrative & Impression  CLINICAL DATA:  63 year old female with history of  DVT and pulmonary embolism. Patient needed to stop Xarelto  for lumbar decompression surgery and an IVC filter was placed on 02/17/2024. Patient underwent the surgery on 02/24/2024. Patient has resumed Xarelto  and presents for filter retrieval evaluation.   EXAM: BILATERAL LOWER EXTREMITY VENOUS DOPPLER ULTRASOUND   TECHNIQUE: Gray-scale sonography with graded compression, as well as color Doppler and duplex ultrasound were performed to evaluate the lower extremity deep venous systems from the level of the common femoral vein and including the common femoral, femoral, profunda femoral, popliteal and calf veins including the posterior tibial, peroneal and gastrocnemius veins when visible. Spectral Doppler was utilized to evaluate flow at rest and with distal augmentation maneuvers in the common femoral, femoral and popliteal veins.   COMPARISON:  Report from 02/08/2019   FINDINGS: RIGHT LOWER EXTREMITY   Common Femoral Vein: No evidence of thrombus. Normal compressibility, respiratory phasicity and response to augmentation.   Saphenofemoral Junction: No evidence of thrombus. Normal compressibility and flow on color Doppler imaging.   Profunda Femoral Vein: No evidence of thrombus.  Normal compressibility and flow on color Doppler imaging.   Femoral Vein: No evidence of thrombus. Normal compressibility, respiratory phasicity and response to augmentation.   Popliteal Vein: No evidence of thrombus. Normal compressibility, respiratory phasicity and response to augmentation.   Calf Veins: 1 posterior tibial vein does not completely compress but there is color Doppler flow in this region.   Other Findings:  None.   LEFT LOWER EXTREMITY   Common Femoral Vein: No evidence of thrombus. Normal compressibility, respiratory phasicity and response to augmentation.   Saphenofemoral Junction: No evidence of thrombus. Normal compressibility and flow on color Doppler imaging.   Profunda Femoral Vein: No evidence of thrombus. Normal compressibility and flow on color Doppler imaging.   Femoral Vein: No evidence of thrombus. Normal compressibility, respiratory phasicity and response to augmentation.   Popliteal Vein: No evidence of thrombus. Normal compressibility, respiratory phasicity and response to augmentation.   Calf Veins: No evidence of thrombus. Normal compressibility and flow on color Doppler imaging.   Other Findings:  None.   IMPRESSION: 1. No evidence of deep venous thrombosis in the left lower extremity. 2. Age-indeterminate thrombus involving a right posterior tibial vein. Favor chronic thrombus. No other evidence for right lower extremity deep venous thrombosis.     Electronically Signed   By: Juliene Balder M.D.   On: 03/14/2024 12:50      Labs:  CBC: Recent Labs    03/25/23 0754 06/24/23 1347 02/13/24 0826  WBC 6.3 8.5 8.4  HGB 13.3 13.4 12.2  HCT 39.9 39.6 38.4  PLT 244 268.0 284    COAGS: No results for input(s): INR, APTT in the last 8760 hours.  BMP: Recent Labs    11/11/23 1135 01/17/24 0753 02/13/24 0800 02/13/24 0826  NA 137 140 138 138  K 4.3 4.4 3.9 4.0  CL 101 102 101 102  CO2 24 20 24 25   GLUCOSE 95 91 89 97   BUN 33* 13 26* 26*  CALCIUM  9.3 9.6 9.4 9.3  CREATININE 1.11 1.02* 1.09* 1.00  GFRNONAA  --   --  57* >60    LIVER FUNCTION TESTS: Recent Labs    06/24/23 1347 10/20/23 0938 11/11/23 1135 01/17/24 0753  BILITOT 0.5 0.4 0.4 0.3  AST 23 24 25 25   ALT 28 30 28 26   ALKPHOS 127* 111 102 107  PROT 8.3 8.0 8.5* 7.7  ALBUMIN 4.5 4.3 4.5 4.3    Assessment:  63 year old  with history of thromboembolic disease and she is on chronic anticoagulation.  The anticoagulation was temporarily stopped for her lumbar spine surgery and an IVC filter was placed prior to the surgery.  Patient has resumed the anticoagulation and no longer needs the IVC filter.  Patient underwent lower extremity venous duplex today.  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  No evidence for acute DVT.  Patient is a candidate for IVC filter retrieval.  We discussed the IVC filter retrieval in depth.  Explained that the experience would be similar to IVC filter placement from her standpoint.  We discussed risks of the procedure including bleeding, infection, vascular injury and incomplete or unsuccessful removal of the filter.  Patient has a good understanding of the procedure.  She will stay on Xarelto  for the IVC filter retrieval.  We will schedule the patient for IVC filter retrieval in the near future.   Electronically Signed: Juliene JONELLE Balder 03/14/2024, 1:03 PM   I spent a total of    10 Minutes in face to face in clinical consultation, greater than 50% of which was counseling/coordinating care for IVC filter management.

## 2024-03-16 ENCOUNTER — Encounter: Payer: Self-pay | Admitting: Oncology

## 2024-03-16 ENCOUNTER — Inpatient Hospital Stay: Payer: Self-pay | Attending: Oncology

## 2024-03-16 DIAGNOSIS — I2692 Saddle embolus of pulmonary artery without acute cor pulmonale: Secondary | ICD-10-CM

## 2024-03-16 DIAGNOSIS — I82531 Chronic embolism and thrombosis of right popliteal vein: Secondary | ICD-10-CM | POA: Insufficient documentation

## 2024-03-16 DIAGNOSIS — R5383 Other fatigue: Secondary | ICD-10-CM | POA: Insufficient documentation

## 2024-03-16 DIAGNOSIS — M549 Dorsalgia, unspecified: Secondary | ICD-10-CM | POA: Insufficient documentation

## 2024-03-16 DIAGNOSIS — Z79899 Other long term (current) drug therapy: Secondary | ICD-10-CM | POA: Insufficient documentation

## 2024-03-16 DIAGNOSIS — Z7901 Long term (current) use of anticoagulants: Secondary | ICD-10-CM | POA: Insufficient documentation

## 2024-03-16 DIAGNOSIS — E669 Obesity, unspecified: Secondary | ICD-10-CM | POA: Insufficient documentation

## 2024-03-16 LAB — CBC WITH DIFFERENTIAL/PLATELET
Abs Immature Granulocytes: 0.03 K/uL (ref 0.00–0.07)
Basophils Absolute: 0.1 K/uL (ref 0.0–0.1)
Basophils Relative: 1 %
Eosinophils Absolute: 0.2 K/uL (ref 0.0–0.5)
Eosinophils Relative: 3 %
HCT: 37.2 % (ref 36.0–46.0)
Hemoglobin: 12.3 g/dL (ref 12.0–15.0)
Immature Granulocytes: 1 %
Lymphocytes Relative: 27 %
Lymphs Abs: 1.8 K/uL (ref 0.7–4.0)
MCH: 31.6 pg (ref 26.0–34.0)
MCHC: 33.1 g/dL (ref 30.0–36.0)
MCV: 95.6 fL (ref 80.0–100.0)
Monocytes Absolute: 0.5 K/uL (ref 0.1–1.0)
Monocytes Relative: 8 %
Neutro Abs: 4.1 K/uL (ref 1.7–7.7)
Neutrophils Relative %: 60 %
Platelets: 296 K/uL (ref 150–400)
RBC: 3.89 MIL/uL (ref 3.87–5.11)
RDW: 13.2 % (ref 11.5–15.5)
WBC: 6.6 K/uL (ref 4.0–10.5)
nRBC: 0 % (ref 0.0–0.2)

## 2024-03-16 LAB — D-DIMER, QUANTITATIVE: D-Dimer, Quant: 1 ug{FEU}/mL — ABNORMAL HIGH (ref 0.00–0.50)

## 2024-03-19 ENCOUNTER — Other Ambulatory Visit: Payer: Self-pay

## 2024-03-19 NOTE — H&P (Signed)
 Chief Complaint: IVC filter removal. Placed for surgical purposes. No longer needed  Referring Physician(s): Dr. Alm Molt  Supervising Physician: Philip Cornet  Patient Status: [facility] - Out-pt  History of Present Illness: Sarah Chung is a 63 y.o. female outpatient. 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  IR placed a Denali IVC  filter on 7.7.25 below the lowest renal vein for planned  surgery. On 7.18.25 The patient successfully underwent a lumbar 4-5 laminectomy and foraminotomy. Sarah Chung restarted her xarelto  on 7.21.25 without issue. She was seen in the IR Clinic on 8.6.25 with IR Attending Dr. Cornet Philip for discussion of IVC filter removal. At that time a detailed discussion regarding the Patient's medical condition including but not limited to possible treatment options took place. Following that discussion the Patient elected to proceed with IVC filter removal . The Patient presents today for IVC filter removal.  Currently without any significant complaints. Patient alert and laying in bed,calm. Denies any fevers, headache, chest pain, SOB, cough, abdominal pain, nausea, vomiting or bleeding.    US  Venous Img Lower Bilateral from 8.6.25 reads No evidence of deep venous thrombosis in the left lower extremity. 2. Age-indeterminate thrombus involving a right posterior tibial vein. Favor chronic thrombus. No other evidence for right lower extremity deep venous thrombosis.  Labs. Patient is on xarelto . Allergies include Iodinated Contrast Media. Reaction nausea. Patient has been NPO since midnight.  Return precautions and treatment recommendations and follow-up discussed with the patient  who is agreeable with the plan.    .  Past Medical History:  Diagnosis Date   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   Allergy    Anxiety    Calculus of gallbladder with acute cholecystitis, without mention of obstruction 07/03/2013   Clotting disorder (HCC)    Coronary artery disease     Depression    DVT (deep venous thrombosis) (HCC)    LLE post-c-section 1993, unprovoked RLE 02/2019   Gallstones    Hyperlipidemia    Hypertension    Pulmonary embolism (HCC) 02/08/2019   Seizures (HCC)     Past Surgical History:  Procedure Laterality Date   CHOLECYSTECTOMY N/A 07/02/2013   Procedure: LAPAROSCOPIC CHOLECYSTECTOMY WITH INTRAOPERATIVE CHOLANGIOGRAM;  Surgeon: Elon CHRISTELLA Pacini, MD;  Location: [facility] ORS;  Service: General;  Laterality: N/A;   IR IVC FILTER PLMT / S&I PORTER GUID/MOD SED  02/13/2024   IR RADIOLOGIST EVAL & MGMT  02/02/2024   IR RADIOLOGIST EVAL & MGMT  03/14/2024   LEFT HEART CATH AND CORONARY ANGIOGRAPHY N/A 04/20/2019   Procedure: LEFT HEART CATH AND CORONARY ANGIOGRAPHY;  Surgeon: Claudene Victory ORN, MD;  Location: [facility] INVASIVE CV LAB;  Service: Cardiovascular;  Laterality: N/A;   LUMBAR LAMINECTOMY/DECOMPRESSION MICRODISCECTOMY N/A 02/24/2024   Procedure: Laminectomy and Foraminotomy - Lumbar Four-Lumbar Five - Lumbar Five-Sacral One;  Surgeon: [name], MD;  Location: [facility] OR;  Service: Neurosurgery;  Laterality: N/A;  Laminectomy and Foraminotomy  - L4-L5 - L5-S1    Allergies: Tegretol [carbamazepine], Ace inhibitors, Iodinated contrast media, Keppra [levetiracetam], Ketorolac tromethamine, Sulfa antibiotics, and Lisinopril -hydrochlorothiazide   Medications: Prior to Admission medications   Medication Sig Start Date End Date Taking? Authorizing Provider  ALPRAZolam  (XANAX ) 1 MG tablet Take 1 tablet (1 mg total) by mouth at bedtime as needed. Patient not taking: Reported on 12/30/2023 12/14/23   Dohmeier, Dedra, MD  ezetimibe  (ZETIA ) 10 MG tablet TAKE 1 TABLET(10 MG) BY MOUTH DAILY Patient not taking: Reported on 12/14/2023 03/11/23   Okey,  Vina GAILS, MD  HYDROcodone -acetaminophen  (NORCO/VICODIN) 5-325 MG tablet Take 1 tablet by mouth every 4 (four) hours as needed for moderate pain (pain score 4-6). 02/27/24   Joshua Alm Hamilton, MD  methocarbamol  (ROBAXIN ) 500 MG  tablet Take 1 tablet (500 mg total) by mouth every 8 (eight) hours as needed for muscle spasms. 02/27/24   Joshua Alm Hamilton, MD  olmesartan -hydrochlorothiazide  (BENICAR  HCT) 40-25 MG tablet Take 1 tablet by mouth daily. 08/04/23   Jodie Lavern CROME, MD  PHENobarbital  (LUMINAL) 64.8 MG tablet Take 2 tablets (129.6 mg total) by mouth at bedtime. 11/08/23   Onita Duos, MD  XARELTO  20 MG TABS tablet TAKE 1 TABLET(20 MG) BY MOUTH DAILY WITH SUPPER 05/09/23   Okey Vina GAILS, MD     Family History  Problem Relation Age of Onset   Heart attack Father    Parkinson's disease Father    Heart disease Father    Heart disease Brother    Heart attack Brother    Mental illness Mother    Yvone' disease Mother    Depression Sister    Kidney disease Maternal Grandfather    Heart attack Paternal Grandmother     Social History   Socioeconomic History   Marital status: Divorced    Spouse name: [name]   Number of children: 2   Years of education: College   Highest education level: Bachelor's degree (e.g., BA, AB, BS)  Occupational History   Occupation: Musician: [employer]  Tobacco Use   Smoking status: Never   Smokeless tobacco: Never  Vaping Use   Vaping status: Never Used  Substance and Sexual Activity   Alcohol use: No   Drug use: No   Sexual activity: Yes    Birth control/protection: Post-menopausal  Other Topics Concern   Not on file  Social History Narrative   Patient is married ([name]) and lives at home with his husband.   Patient has two adult children.   Patient is working full-time.   Patient has a college education.   Patient is right-handed.   Patient drinks two cups of coffee and some soda.   Social Drivers of Corporate investment banker Strain: Low Risk  (10/19/2023)   Overall Financial Resource Strain (CARDIA)    Difficulty of Paying Living Expenses: Not hard at all  Food Insecurity: No Food Insecurity (02/25/2024)   Hunger Vital Sign    Worried About  Running Out of Food in the Last Year: Never true    Ran Out of Food in the Last Year: Never true  Transportation Needs: No Transportation Needs (02/25/2024)   PRAPARE - Administrator, Civil Service (Medical): No    Lack of Transportation (Non-Medical): No  Physical Activity: Unknown (10/19/2023)   Exercise Vital Sign    Days of Exercise per Week: Patient declined    Minutes of Exercise per Session: Not on file  Stress: Stress Concern Present (10/19/2023)   [employer] of Occupational Health - Occupational Stress Questionnaire    Feeling of Stress : To some extent  Social Connections: Unknown (10/19/2023)   Social Connection and Isolation Panel    Frequency of Communication with Friends and Family: More than three times a week    Frequency of Social Gatherings with Friends and Family: Three times a week    Attends Religious Services: More than 4 times per year    Active Member of Clubs or Organizations: Patient declined    Attends [employer]  or Organization Meetings: Not on file    Marital Status: Divorced    Review of Systems: A 12 point ROS discussed and pertinent positives are indicated in the HPI above.  All other systems are negative.  Review of Systems  Constitutional:  Negative for fatigue and fever.  HENT:  Negative for congestion.   Respiratory:  Negative for cough and shortness of breath.   Gastrointestinal:  Negative for abdominal pain, diarrhea, nausea and vomiting.    Vital Signs: BP 117/71   Pulse 64   Temp 98.5 F (36.9 C) (Oral)   Resp 17   Ht 5' 4 (1.626 m)   Wt 200 lb (90.7 kg)   SpO2 98%   BMI 34.33 kg/m     Physical Exam Vitals and nursing note reviewed.  Constitutional:      Appearance: She is well-developed.  HENT:     Head: Normocephalic and atraumatic.     Mouth/Throat:     Mouth: Mucous membranes are dry.  Eyes:     Conjunctiva/sclera: Conjunctivae normal.  Pulmonary:     Effort: Pulmonary effort is normal.  Musculoskeletal:         General: Normal range of motion.     Cervical back: Normal range of motion.  Skin:    General: Skin is warm and dry.  Neurological:     General: No focal deficit present.     Mental Status: She is alert and oriented to person, place, and time. Mental status is at baseline.  Psychiatric:        Mood and Affect: Mood normal.        Behavior: Behavior normal.        Thought Content: Thought content normal.        Judgment: Judgment normal.     Imaging: US  Venous Img Lower Bilateral (DVT) Result Date: 03/14/2024 CLINICAL DATA:  63 year old female with history of DVT and pulmonary embolism. Patient needed to stop Xarelto  for lumbar decompression surgery and an IVC filter was placed on 02/17/2024. Patient underwent the surgery on 02/24/2024. Patient has resumed Xarelto  and presents for filter retrieval evaluation. EXAM: BILATERAL LOWER EXTREMITY VENOUS DOPPLER ULTRASOUND TECHNIQUE: Gray-scale sonography with graded compression, as well as color Doppler and duplex ultrasound were performed to evaluate the lower extremity deep venous systems from the level of the common femoral vein and including the common femoral, femoral, profunda femoral, popliteal and calf veins including the posterior tibial, peroneal and gastrocnemius veins when visible. Spectral Doppler was utilized to evaluate flow at rest and with distal augmentation maneuvers in the common femoral, femoral and popliteal veins. COMPARISON:  Report from 02/08/2019 FINDINGS: RIGHT LOWER EXTREMITY Common Femoral Vein: No evidence of thrombus. Normal compressibility, respiratory phasicity and response to augmentation. Saphenofemoral Junction: No evidence of thrombus. Normal compressibility and flow on color Doppler imaging. Profunda Femoral Vein: No evidence of thrombus. Normal compressibility and flow on color Doppler imaging. Femoral Vein: No evidence of thrombus. Normal compressibility, respiratory phasicity and response to augmentation.  Popliteal Vein: No evidence of thrombus. Normal compressibility, respiratory phasicity and response to augmentation. Calf Veins: 1 posterior tibial vein does not completely compress but there is color Doppler flow in this region. Other Findings:  None. LEFT LOWER EXTREMITY Common Femoral Vein: No evidence of thrombus. Normal compressibility, respiratory phasicity and response to augmentation. Saphenofemoral Junction: No evidence of thrombus. Normal compressibility and flow on color Doppler imaging. Profunda Femoral Vein: No evidence of thrombus. Normal compressibility and flow on color Doppler imaging. Femoral  Vein: No evidence of thrombus. Normal compressibility, respiratory phasicity and response to augmentation. Popliteal Vein: No evidence of thrombus. Normal compressibility, respiratory phasicity and response to augmentation. Calf Veins: No evidence of thrombus. Normal compressibility and flow on color Doppler imaging. Other Findings:  None. IMPRESSION: 1. No evidence of deep venous thrombosis in the left lower extremity. 2. Age-indeterminate thrombus involving a right posterior tibial vein. Favor chronic thrombus. No other evidence for right lower extremity deep venous thrombosis. Electronically Signed   By: Juliene Balder M.D.   On: 03/14/2024 12:50   IR Radiologist Eval & Mgmt Result Date: 03/14/2024 : See note in Epic. Electronically Signed   By: Juliene Balder M.D.   On: 03/14/2024 12:41   DG Lumbar Spine 2-3 Views Result Date: 02/24/2024 CLINICAL DATA:  L4-5 laminectomy and 4 mm min item E EXAM: LUMBAR SPINE - 2-3 VIEW COMPARISON:  January 26, 2024 FLUOROSCOPY: Exposure Index (as provided by the fluoroscopic device): 4.1 mGy Kerma Fluoroscopy time: 0 minutes 6 seconds FINDINGS: Spot lateral view fluoroscopic images of the lower lumbar spine. Radiodense marker and surgical instrument project over the posterior aspect of the L4 vertebra. Please refer to dedicated operative report for further details. IMPRESSION:  Limited intraoperative lateral views of the lower lumbar spine with marker projecting over the posterior aspect of the L4 vertebra. Please refer to dedicated operative report for further details. Electronically Signed   By: Michaeline Blanch M.D.   On: 02/24/2024 13:56   DG C-Arm 1-60 Min-No Report Result Date: 02/24/2024 Fluoroscopy was utilized by the requesting physician.  No radiographic interpretation.    Labs:  CBC: Recent Labs    03/25/23 0754 06/24/23 1347 02/13/24 0826 03/16/24 0910  WBC 6.3 8.5 8.4 6.6  HGB 13.3 13.4 12.2 12.3  HCT 39.9 39.6 38.4 37.2  PLT 244 268.0 284 296    COAGS: No results for input(s): INR, APTT in the last 8760 hours.  BMP: Recent Labs    11/11/23 1135 01/17/24 0753 02/13/24 0800 02/13/24 0826  NA 137 140 138 138  K 4.3 4.4 3.9 4.0  CL 101 102 101 102  CO2 24 20 24 25   GLUCOSE 95 91 89 97  BUN 33* 13 26* 26*  CALCIUM  9.3 9.6 9.4 9.3  CREATININE 1.11 1.02* 1.09* 1.00  GFRNONAA  --   --  57* >60    LIVER FUNCTION TESTS: Recent Labs    06/24/23 1347 10/20/23 0938 11/11/23 1135 01/17/24 0753  BILITOT 0.5 0.4 0.4 0.3  AST 23 24 25 25   ALT 28 30 28 26   ALKPHOS 127* 111 102 107  PROT 8.3 8.0 8.5* 7.7  ALBUMIN 4.5 4.3 4.5 4.3    TUMOR MARKERS: No results for input(s): AFPTM, CEA, CA199, CHROMGRNA in the last 8760 hours.  Assessment and Plan:  63 y.o. female outpatient. 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  IR placed a Denali IVC  filter on 7.7.25 below the lowest renal vein for planned  surgery. On 7.18.25 The patient successfully underwent a lumbar 4-5 laminectomy and foraminotomy. Sarah Hofbauer restarted her xarelto  on 7.21.25 without issues. She was seen in the IR Clinic on 8.6.25 with IR Attending Dr. Juliene Balder for discussion of IVC filter removal. At that time a detailed discussion regarding the Patient's medical condition including but not limited to possible treatment options took place. Following that  discussion the Patient elected to proceed with IVC filter removal . The Patient presents today for IVC filter removal.  PLAN: IR Image Guided IVC Filter Removal    Risks and benefits discussed with the patient including, but not limited to bleeding, infection, contrast induced renal failure, filter fracture or migration which can lead to emergency surgery or even death, strut penetration with damage or irritation to adjacent structures and caval thrombosis. All of the patient's questions were answered, patient is agreeable to proceed. Consent signed and in chart.  Thank you for this interesting consult.  I greatly enjoyed meeting DONATELLA WALSKI and look forward to participating in their care.  A copy of this report was sent to the requesting provider on this date.  Electronically Signed: Delon JAYSON Beagle, NP 03/20/2024, 8:40 AM   I spent a total of    15 Minutes in face to face in clinical consultation, greater than 50% of which was counseling/coordinating care for IVC filter removal

## 2024-03-20 ENCOUNTER — Ambulatory Visit (HOSPITAL_COMMUNITY)
Admission: RE | Admit: 2024-03-20 | Discharge: 2024-03-20 | Disposition: A | Discharge status: Home / Self Care | Payer: Self-pay | Source: Ambulatory Visit | Attending: Diagnostic Radiology | Admitting: Diagnostic Radiology

## 2024-03-20 ENCOUNTER — Other Ambulatory Visit: Payer: Self-pay

## 2024-03-20 DIAGNOSIS — Z95828 Presence of other vascular implants and grafts: Secondary | ICD-10-CM

## 2024-03-20 DIAGNOSIS — I10 Essential (primary) hypertension: Secondary | ICD-10-CM | POA: Insufficient documentation

## 2024-03-20 DIAGNOSIS — Z7901 Long term (current) use of anticoagulants: Secondary | ICD-10-CM | POA: Insufficient documentation

## 2024-03-20 DIAGNOSIS — Z4589 Encounter for adjustment and management of other implanted devices: Secondary | ICD-10-CM | POA: Insufficient documentation

## 2024-03-20 DIAGNOSIS — Z86711 Personal history of pulmonary embolism: Secondary | ICD-10-CM | POA: Insufficient documentation

## 2024-03-20 DIAGNOSIS — E785 Hyperlipidemia, unspecified: Secondary | ICD-10-CM | POA: Insufficient documentation

## 2024-03-20 DIAGNOSIS — I824Y9 Acute embolism and thrombosis of unspecified deep veins of unspecified proximal lower extremity: Secondary | ICD-10-CM

## 2024-03-20 DIAGNOSIS — Z86718 Personal history of other venous thrombosis and embolism: Secondary | ICD-10-CM | POA: Insufficient documentation

## 2024-03-20 HISTORY — PX: IR IVC FILTER RETRIEVAL / S&I /IMG GUID/MOD SED: IMG5308

## 2024-03-20 MED ORDER — MIDAZOLAM HCL 2 MG/2ML IJ SOLN
INTRAMUSCULAR | Status: AC | PRN
  Administered 2024-03-20: 1 mg via INTRAVENOUS
  Administered 2024-03-20 (×2): .5 mg via INTRAVENOUS
  Administered 2024-03-20 (×2): 1 mg via INTRAVENOUS
  Administered 2024-03-20: .5 mg via INTRAVENOUS
  Administered 2024-03-20: 1 mg via INTRAVENOUS
  Administered 2024-03-20: .5 mg via INTRAVENOUS
  Administered 2024-03-20 (×2): 1 mg via INTRAVENOUS

## 2024-03-20 MED ORDER — LIDOCAINE HCL 1 % IJ SOLN
20.0000 mL | Freq: Once | INTRAMUSCULAR | Status: AC
  Administered 2024-03-20 (×2): 10 mL via INTRADERMAL

## 2024-03-20 MED ORDER — ONDANSETRON HCL 4 MG/2ML IJ SOLN
INTRAMUSCULAR | Status: AC | PRN
  Administered 2024-03-20 (×2): 4 mg via INTRAVENOUS

## 2024-03-20 MED ORDER — ONDANSETRON HCL 4 MG/2ML IJ SOLN: 4.0000 mg | Freq: Once | INTRAMUSCULAR | Status: DC

## 2024-03-20 MED ORDER — HYDROCODONE-ACETAMINOPHEN 5-325 MG PO TABS: 1.0000 | ORAL_TABLET | ORAL | Status: DC | PRN

## 2024-03-20 MED ORDER — LIDOCAINE HCL 1 % IJ SOLN
INTRAMUSCULAR | Status: AC
  Filled 2024-03-20: qty 20

## 2024-03-20 MED ORDER — FENTANYL CITRATE (PF) 100 MCG/2ML IJ SOLN
INTRAMUSCULAR | Status: AC | PRN
  Administered 2024-03-20 (×8): 25 ug via INTRAVENOUS

## 2024-03-20 MED ORDER — IOHEXOL 300 MG/ML  SOLN
100.0000 mL | Freq: Once | INTRAMUSCULAR | Status: AC | PRN
  Administered 2024-03-20 (×2): 50 mL via INTRAVENOUS

## 2024-03-20 MED ORDER — MIDAZOLAM HCL 2 MG/2ML IJ SOLN
INTRAMUSCULAR | Status: AC
  Filled 2024-03-20: qty 2

## 2024-03-20 MED ORDER — FENTANYL CITRATE (PF) 100 MCG/2ML IJ SOLN
INTRAMUSCULAR | Status: AC
Start: 2024-03-20 — End: 2024-03-20
  Filled 2024-03-20: qty 2

## 2024-03-20 MED ORDER — FENTANYL CITRATE (PF) 100 MCG/2ML IJ SOLN
INTRAMUSCULAR | Status: AC
  Filled 2024-03-20: qty 2

## 2024-03-20 MED ORDER — ONDANSETRON HCL 4 MG/2ML IJ SOLN
INTRAMUSCULAR | Status: AC
  Filled 2024-03-20: qty 2

## 2024-03-20 NOTE — Procedures (Signed)
 Interventional Radiology Procedure:   Indications: IVC filter placed prior to spine surgery.  Presents for IVC filter retrieval.  Patient is currently on anticoagulation  Procedure: IVC venogram and IVC filter retrieval  Findings: Small amount of clot within the apex of the filter cone.   Filter was successfully retrieved and filter legs were intact after retrieval.  Complications: None     EBL: Minimal  Plan: Bedrest 2 hours, discharge to home, continue anticoagulation.   Sarah Chung R. Philip, MD  Pager: [phone]

## 2024-03-20 NOTE — Progress Notes (Signed)
 Patient was given discharge instructions. Both verbalized understanding.

## 2024-03-23 ENCOUNTER — Inpatient Hospital Stay (HOSPITAL_BASED_OUTPATIENT_CLINIC_OR_DEPARTMENT_OTHER): Payer: Self-pay | Admitting: Oncology

## 2024-03-23 VITALS — BP 126/81 | HR 68 | Temp 98.2°F | Resp 16

## 2024-03-23 DIAGNOSIS — Z9889 Other specified postprocedural states: Secondary | ICD-10-CM

## 2024-03-23 DIAGNOSIS — Z7901 Long term (current) use of anticoagulants: Secondary | ICD-10-CM

## 2024-03-23 DIAGNOSIS — I2782 Chronic pulmonary embolism: Secondary | ICD-10-CM

## 2024-03-23 DIAGNOSIS — I2692 Saddle embolus of pulmonary artery without acute cor pulmonale: Secondary | ICD-10-CM

## 2024-03-23 NOTE — Progress Notes (Signed)
 [facility] OFFICE PROGRESS NOTE  Sarah Lavern CROME, MD  ASSESSMENT & PLAN:  Assessment & Plan Long term current use of anticoagulant therapy She continues Xarelto .  Tolerates well. Acute saddle pulmonary embolism, unspecified whether acute cor pulmonale present (HCC) - Most recent D-dimer is 1.00 which is slightly up from her normal.  This is likely secondary to recent spinal surgery. - She remains on Xarelto  and is tolerating it well without any bleeding events   - No signs or symptoms of recurrent VTE   -Patient did have a bilateral lower extremity Doppler on 03/14/2024 given she came off the Xarelto .  Results showed no acute thrombus but did show an old thrombus in right posterior tibial vein.  Favored chronic thrombus. -Continue Xarelto .   S/P lumbar laminectomy She had lumbar laminectomy on 02/24/2024 and did well.  She had IVC filter placed on 02/13/2024 and removed on 03/20/2024.  She restarted Xarelto  on 02/27/2024 without issue.  She is still undergoing PT after surgery and uses a walker for ambulation.  Feels like she is slowly starting to improve.  Continue follow-up with Ortho.    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  She is currently on Xarelto  and tolerating well.  She was last seen over a year ago in our clinic.  She recently was admitted for lumbar laminectomy.  Patient has had progressive lower extremity weakness and left foot drop which was thought to initially be due to the use of a statin.   He had an IVC placed on 02/13/2024 for planned surgery on 02/24/2024.  She restarted Xarelto  on 02/27/2024 without issue.  IVC filter was removed on 03/20/2024.   She had several MRIs along with a nerve conduction study.  She had an ultrasound of bilateral lower extremities on 03/14/2024 which did not reveal evidence of a DVT in the left leg but showed an age-indeterminate thrombus involving the right posterior tibial vein favored to be a chronic thrombus.  She is currently without concern for recurrent blood clot.  She is back on her Xarelto .  She cannot walk long distances but is currently undergoing physical therapy after surgery.  She uses a walker mostly to prevent falls.  We reviewed CBC and D-dimer.   SUMMARY OF HEMATOLOGIC HISTORY: - Presentation to the ER on 02/08/2019 with right leg pain and shortness of breath on exertion, started 2 to 3 days ago. - Ultrasound of the lower extremities on 02/08/2019 was positive for acute occlusive DVT in the right popliteal and calf veins. - CT of the chest PE protocol on 02/08/2019 shows bulky bilateral clot burden and (left eccentric) saddle embolus.  CT evidence of heart strain. - She was admitted from 02/08/2019 through 02/10/2019 at [facility], started on anticoagulation, transitioned to Xarelto . - She had a history of left leg DVT 1993, few days after a C-section.  She was on limited duration Coumadin at that time. - She did not have any instigating factors prior to the latest DVT and PE. - 2D echo on 02/09/2019 shows ejection fraction of 60 to 65% with  normal right ventricular cavity. - At time of diagnosis, Dr. 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  Based on her recent DVT and pulmonary embolism with clot burden, he recommended indefinite anticoagulation.  No indication for further hypercoagulable testing, as it would not affect the duration of anticoagulation. - She did not have any history of miscarriages.  She is a non-smoker.  No family history of DVT or malignancies. - She is up-to-date on her recommended  age-appropriate cancer screenings: She had a colonoscopy on 02/02/2018 which showed no endoscopic evidence of bleeding, diverticula, mass, polyps or tumor in the entire colon.  She also has yearly mammograms which were normal.  No B symptoms or any indication of occult malignancy.   No results found for: CBC  Vitals:   03/23/24 0957  BP: 126/81  Pulse: 68  Resp: 16  Temp: 98.2 F (36.8 C)  SpO2: 99%    Review of Systems  Constitutional:  Positive for malaise/fatigue.  Respiratory:  Negative for cough, sputum production and shortness of breath.   Cardiovascular:  Negative for chest pain and leg swelling.  Gastrointestinal:  Negative for abdominal pain and constipation.  Musculoskeletal:  Positive for back pain. Negative for falls.  Neurological:  Positive for weakness. Negative for dizziness and headaches.    Physical Exam Constitutional:      Appearance: Normal appearance. She is obese.  Cardiovascular:     Rate and Rhythm: Normal rate and regular rhythm.  Pulmonary:     Effort: Pulmonary effort is normal.     Breath sounds: Normal breath sounds.  Abdominal:     General: Bowel sounds are normal.     Palpations: Abdomen is soft.  Musculoskeletal:        General: No swelling or deformity. Normal range of motion.  Neurological:     Mental Status: She is alert and oriented to person, place, and time. Mental status is at baseline.     I spent 25 minutes dedicated to the care of this patient (face-to-face and non-face-to-face) on the date of the encounter to include what is described in the assessment and plan.,  Delon Hope, NP 03/25/2024 8:59 AM

## 2024-03-23 NOTE — Assessment & Plan Note (Deleted)
-   Most recent D-dimer is 1.00 which is slightly up from her normal.  This is likely secondary to recent spinal surgery. - She remains on Xarelto  and is tolerating it well without any bleeding events   - No signs or symptoms of recurrent VTE   -Continue Xarelto .

## 2024-03-25 NOTE — Assessment & Plan Note (Signed)
 She had lumbar laminectomy on 02/24/2024 and did well.  She had IVC filter placed on 02/13/2024 and removed on 03/20/2024.  She restarted Xarelto  on 02/27/2024 without issue.  She is still undergoing PT after surgery and uses a walker for ambulation.  Feels like she is slowly starting to improve.  Continue follow-up with Ortho.

## 2024-03-25 NOTE — Assessment & Plan Note (Signed)
-   Most recent D-dimer is 1.00 which is slightly up from her normal.  This is likely secondary to recent spinal surgery. - She remains on Xarelto  and is tolerating it well without any bleeding events   - No signs or symptoms of recurrent VTE   -Patient did have a bilateral lower extremity Doppler on 03/14/2024 given she came off the Xarelto .  Results showed no acute thrombus but did show an old thrombus in right posterior tibial vein.  Favored chronic thrombus. -Continue Xarelto .

## 2024-04-04 ENCOUNTER — Ambulatory Visit: Payer: Self-pay

## 2024-04-04 NOTE — Telephone Encounter (Signed)
 FYI Only or Action Required?: FYI only for provider.  Patient was last seen in primary care on 11/11/2023 by Jodie Lavern CROME, MD.  Called Nurse Triage reporting Dizziness.  Symptoms began several days ago.  Interventions attempted: Nothing.  Symptoms are: stable.  Triage Disposition: See Physician Within 24 Hours  Patient/caregiver understands and will follow disposition?: Yes   Copied from CRM #[id number]. Topic: Clinical - Red Word Triage >> Apr 04, 2024  4:08 PM [place] E wrote: [facility] that prompted transfer to Nurse Triage: Debby, PT with [facility] called in stating patient is complaining of vertigo, and when patient lays down her blood pressure rises. Reason for Disposition  [1] MODERATE dizziness (e.g., vertigo; feels very unsteady, interferes with normal activities) AND [2] has NOT been evaluated by doctor (or NP/PA) for this  Answer Assessment - Initial Assessment Questions Debby, Physical Therapist called in on set of vertigo on Monday, intermittent, BP normal at beginning, once laid on table 178/101. Takes BP medicine, took medicine today. BP coming down once sitting up. Had Surgery on July 18, filter put in before the surgery and taken out August 8th.    1. DESCRIPTION: Describe your dizziness.     Room spinning, feels like room is moving but body is not.   2. VERTIGO: Do you feel like either you or the room is spinning or tilting?       described room spinning   3. LIGHTHEADED: Do you feel lightheaded? (e.g., somewhat faint, woozy, weak upon standing)     Yes  4. SEVERITY: How bad is it?  Can you walk?     Feels like will fall over sometimes   5. ONSET:  When did the dizziness begin?     Monday  6. AGGRAVATING FACTORS: Does anything make it worse? (e.g., standing, change in head position)     Laying down  7. CAUSE: What do you think is causing the dizziness?     Unknown  8. RECURRENT SYMPTOM: Have you had dizziness before? If Yes,  ask: When was the last time? What happened that time?     No  9. OTHER SYMPTOMS: Do you have any other symptoms? (e.g., earache, headache, numbness, tinnitus, vomiting, weakness)     Nausea, states she just does not feel good.  Protocols used: Dizziness - Vertigo-A-AH

## 2024-04-05 ENCOUNTER — Ambulatory Visit (INDEPENDENT_AMBULATORY_CARE_PROVIDER_SITE_OTHER): Payer: Self-pay | Admitting: Family Medicine

## 2024-04-05 ENCOUNTER — Encounter: Payer: Self-pay | Admitting: Family Medicine

## 2024-04-05 VITALS — BP 116/79 | HR 67 | Temp 98.1°F | Ht 64.0 in | Wt 203.0 lb

## 2024-04-05 DIAGNOSIS — I2782 Chronic pulmonary embolism: Secondary | ICD-10-CM

## 2024-04-05 DIAGNOSIS — I10 Essential (primary) hypertension: Secondary | ICD-10-CM

## 2024-04-05 DIAGNOSIS — R42 Dizziness and giddiness: Secondary | ICD-10-CM

## 2024-04-05 MED ORDER — MECLIZINE HCL 25 MG PO TABS: 25.0000 mg | ORAL_TABLET | Freq: Three times a day (TID) | ORAL | 0 refills | Status: DC | PRN

## 2024-04-05 MED ORDER — ONDANSETRON 8 MG PO TBDP: 8.0000 mg | ORAL_TABLET | Freq: Three times a day (TID) | ORAL | 0 refills | Status: AC | PRN

## 2024-04-05 MED ORDER — ONDANSETRON HCL 4 MG PO TABS
4.0000 mg | ORAL_TABLET | Freq: Once | ORAL | Status: AC
  Administered 2024-04-05: 4 mg via ORAL

## 2024-04-05 MED ORDER — ONDANSETRON HCL 4 MG PO TABS
8.0000 mg | ORAL_TABLET | Freq: Once | ORAL | Status: DC
  Administered 2024-04-05: 8 mg via ORAL

## 2024-04-05 NOTE — Telephone Encounter (Signed)
 Appt today

## 2024-04-05 NOTE — Patient Instructions (Signed)
 It was very nice to see you today!  VISIT SUMMARY: Today, we addressed your vertigo and elevated blood pressure. Your symptoms are consistent with benign paroxysmal positional vertigo (BPPV), and we have a plan to manage it.  YOUR PLAN: BENIGN PAROXYSMAL POSITIONAL VERTIGO (BPPV): Your vertigo and nausea are due to a condition called BPPV, which is caused by small crystals in your inner ear being out of place. -Take meclizine  every 8 hours to help with the vertigo. -Take Zofran  as needed for nausea. -Attend vestibular rehabilitation therapy for Epley maneuvers to help reposition the crystals in your ear. -Move carefully to avoid making your symptoms worse.  ELEVATED BLOOD PRESSURE: Your elevated blood pressure is likely a response to the vertigo. -Monitor your blood pressure regularly. -If you notice any significant changes or have concerns, contact us  immediately.  No follow-ups on file.   Take care, Dr Kennyth  PLEASE NOTE:  If you had any lab tests, please let us  know if you have not heard back within a few days. You may see your results on mychart before we have a chance to review them but we will give you a call once they are reviewed by us .   If we ordered any referrals today, please let us  know if you have not heard from their office within the next week.   If you had any urgent prescriptions sent in today, please check with the pharmacy within an hour of our visit to make sure the prescription was transmitted appropriately.   Please try these tips to maintain a healthy lifestyle:  Eat at least 3 REAL meals and 1-2 snacks per day.  Aim for no more than 5 hours between eating.  If you eat breakfast, please do so within one hour of getting up.   Each meal should contain half fruits/vegetables, one quarter protein, and one quarter carbs (no bigger than a computer mouse)  Cut down on sweet beverages. This includes juice, soda, and sweet tea.   Drink at least 1 glass of water  with each meal and aim for at least 8 glasses per day  Exercise at least 150 minutes every week.

## 2024-04-05 NOTE — Progress Notes (Signed)
 Sarah Chung is a 63 y.o. female who presents today for an office visit.  Assessment/Plan:  Vertigo  History consistent with classic BPPV.  Positive Dix-Hallpike on exam today which did exacerbate her symptoms.  She was given 8 mg of Zofran  in the office today for her subsequent nausea.  Her symptoms had resolved by the time of her leaving the office.  Otherwise her neuroexam today was reassuring without any other significant abnormalities.  Will start meclizine .  Also send a prescription for Zofran  that she can take home.  Will place referral to vestibular rehab specialist.  We discussed reasons to return to care.  Elevated blood pressure At goal today.  May have transient elevation due to her recent BPV flare.  She is on olmesartan  HCTZ and has been tolerating well.  She will continue to monitor home and let us  know if persistently elevated  History of pulmonary embolism x 2 Currently anticoagulated on Xarelto .  Recently had IVC filter removed a few weeks ago.  Reassured patient that her current vertigo is likely unrelated to her history of blood clots.  Very low suspicion for recurrent VTE given that she has been anticoagulated on Xarelto .    Subjective:  HPI:  See assessment / plan for status of chronic conditions.   Discussed the use of AI scribe software for clinical note transcription with the patient, who gave verbal consent to proceed.  History of Present Illness Sarah Chung is a 63 year old female who presents with vertigo and elevated blood pressure.  She has been experiencing vertigo since Monday, with the initial episode occurring upon getting up in the morning, accompanied by a sensation of body movement and nausea. The vertigo was severe enough that the ceiling appeared to spin when she lay back down in the afternoon. She continues to experience varying levels of dizziness, often triggered by movement such as getting out of bed or a car. The patient is unsure if the vertigo  is triggered by head motion, and reports that it has occurred even while lying down during physical therapy. No hearing issues or tinnitus are reported. The vertigo does not occur while walking or driving but is exacerbated by certain movements, particularly when lying down or getting up. She has been largely bedridden post-surgery, often lying on her side.  During a physical therapy session, she experienced a significant increase in blood pressure, rising from 122 to 178 mmHg within 30 minutes, later decreasing to 136 mmHg. She is unsure if the vertigo and blood pressure changes are related. Her medical history includes a recent laminectomy and the removal of an IVC filter on August 12th, during which a small clot was found. She has a history of pulmonary embolism and is currently on Xarelto .         Objective:  Physical Exam: BP 116/79   Pulse 67   Temp 98.1 F (36.7 C) (Temporal)   Ht 5' 4 (1.626 m)   Wt 203 lb (92.1 kg)   SpO2 98%   BMI 34.84 kg/m   Gen: No acute distress, resting comfortably CV: Regular rate and rhythm with no murmurs appreciated Pulm: Normal work of breathing, clear to auscultation bilaterally with no crackles, wheezes, or rhonchi Neuro: Grossly normal, moves all extremities.  Cranial nerves II through XII intact.  Finger-nose-finger testing intact bilaterally.  Dix-Hallpike positive. Psych: Normal affect and thought content  Time Spent: 30 minutes of total time was spent on the date of the encounter performing  the following actions: chart review prior to seeing the patient, obtaining history, performing a medically necessary exam, counseling on the treatment plan, placing orders, and documenting in our EHR.        Sarah Chung. Kennyth, MD 04/05/2024 10:31 AM

## 2024-04-05 NOTE — Addendum Note (Signed)
 Addended by: IDA ELORA HERO on: 04/05/2024 12:57 PM   Modules accepted: Orders

## 2024-05-02 ENCOUNTER — Encounter (HOSPITAL_COMMUNITY): Payer: Self-pay

## 2024-05-02 ENCOUNTER — Emergency Department (HOSPITAL_COMMUNITY)
Admission: EM | Admit: 2024-05-02 | Discharge: 2024-05-03 | Disposition: A | Discharge status: Home / Self Care | Attending: Emergency Medicine | Admitting: Emergency Medicine

## 2024-05-02 ENCOUNTER — Emergency Department (HOSPITAL_COMMUNITY)

## 2024-05-02 ENCOUNTER — Other Ambulatory Visit: Payer: Self-pay

## 2024-05-02 DIAGNOSIS — X501XXA Overexertion from prolonged static or awkward postures, initial encounter: Secondary | ICD-10-CM | POA: Insufficient documentation

## 2024-05-02 DIAGNOSIS — Y9301 Activity, walking, marching and hiking: Secondary | ICD-10-CM | POA: Insufficient documentation

## 2024-05-02 DIAGNOSIS — M7989 Other specified soft tissue disorders: Secondary | ICD-10-CM | POA: Insufficient documentation

## 2024-05-02 DIAGNOSIS — Z7901 Long term (current) use of anticoagulants: Secondary | ICD-10-CM | POA: Insufficient documentation

## 2024-05-02 DIAGNOSIS — S93401A Sprain of unspecified ligament of right ankle, initial encounter: Secondary | ICD-10-CM | POA: Insufficient documentation

## 2024-05-02 NOTE — ED Triage Notes (Signed)
 Pt BIB EMS from home with c/o falling after losing balance while using cane injuring right ankle. No obvious deformity noted- splint applied by EMS

## 2024-05-03 MED ORDER — OXYCODONE-ACETAMINOPHEN 5-325 MG PO TABS
2.0000 | ORAL_TABLET | Freq: Once | ORAL | Status: AC
  Administered 2024-05-03: 2 via ORAL
  Filled 2024-05-03: qty 2

## 2024-05-03 MED ORDER — OXYCODONE-ACETAMINOPHEN 5-325 MG PO TABS: 2.0000 | ORAL_TABLET | Freq: Once | ORAL | Status: DC

## 2024-05-03 MED ORDER — OXYCODONE-ACETAMINOPHEN 5-325 MG PO TABS: 1.0000 | ORAL_TABLET | Freq: Four times a day (QID) | ORAL | 0 refills | Status: DC | PRN

## 2024-05-03 NOTE — Discharge Instructions (Addendum)
 Ice for 20 minutes every 2 hours while awake for the next 2 days.  Elevate your leg as much as possible for the next several days.  Follow-up with primary doctor or orthopedist if not improving in the next week.

## 2024-05-03 NOTE — ED Notes (Signed)
 ED Provider at bedside.

## 2024-05-03 NOTE — ED Provider Notes (Signed)
 [place] EMERGENCY DEPARTMENT AT [facility] Provider Note   CSN: [id number] Arrival date & time: 05/02/24  2222     Patient presents with: Sarah Chung is a 63 y.o. female.   Patient is a 63 year old presenting with a right ankle injury.  She reports walking up steps during physical therapy, that her right ankle twisting and giving away.  She describes severe pain throughout her ankle.  Pain is worse with ambulation or movement with no alleviating factors.       Prior to Admission medications   Medication Sig Start Date End Date Taking? Authorizing Provider  HYDROcodone -acetaminophen  (NORCO/VICODIN) 5-325 MG tablet Take 1 tablet by mouth every 4 (four) hours as needed for moderate pain (pain score 4-6). 02/27/24   Joshua Alm Hamilton, MD  meclizine  (ANTIVERT ) 25 MG tablet Take 1 tablet (25 mg total) by mouth 3 (three) times daily as needed for dizziness. 04/05/24   Kennyth Worth CHRISTELLA, MD  methocarbamol  (ROBAXIN ) 500 MG tablet Take 1 tablet (500 mg total) by mouth every 8 (eight) hours as needed for muscle spasms. 02/27/24   Joshua Alm Hamilton, MD  olmesartan -hydrochlorothiazide  (BENICAR  HCT) 40-25 MG tablet Take 1 tablet by mouth daily. 08/04/23   Jodie Lavern CROME, MD  ondansetron  (ZOFRAN -ODT) 8 MG disintegrating tablet Take 1 tablet (8 mg total) by mouth every 8 (eight) hours as needed for nausea or vomiting. 04/05/24   Kennyth Worth CHRISTELLA, MD  PHENobarbital  (LUMINAL) 64.8 MG tablet Take 2 tablets (129.6 mg total) by mouth at bedtime. 11/08/23   Onita Duos, MD  XARELTO  20 MG TABS tablet TAKE 1 TABLET(20 MG) BY MOUTH DAILY WITH SUPPER 05/09/23   Okey Vina GAILS, MD    Allergies: Carbamazepine, Ace inhibitors, Cephalexin, Gadolinium derivatives, Iodinated contrast media, Ketorolac, Ketorolac tromethamine, Levetiracetam, Lisinopril , Lisinopril -hydrochlorothiazide , and Sulfa antibiotics    Review of Systems  All other systems reviewed and are negative.   Updated Vital Signs BP  120/70   Pulse 85   Temp 98.6 F (37 C)   Resp 17   Ht 5' 4 (1.626 m)   Wt 92.1 kg   SpO2 99%   BMI 34.85 kg/m   Physical Exam Vitals and nursing note reviewed.  Constitutional:      Appearance: Normal appearance.  Pulmonary:     Effort: Pulmonary effort is normal.  Musculoskeletal:        General: Swelling and tenderness present.     Comments: There is some swelling and ecchymosis noted in the ankle.  DP pulses are easily palpable and motor and sensation are intact throughout the entire foot.  Skin:    General: Skin is warm and dry.  Neurological:     Mental Status: She is alert and oriented to person, place, and time.     (all labs ordered are listed, but only abnormal results are displayed) Labs Reviewed - No data to display  EKG: None  Radiology: DG Ankle 2 Views Right Result Date: 05/02/2024 EXAM: 2 VIEW(S) XRAY OF THE ANKLE 05/02/2024 11:30:00 PM CLINICAL HISTORY: fall. Fell after stepping down, painful around ankle region w/swelling mediolaterally COMPARISON: None available. FINDINGS: BONES AND JOINTS: No acute fracture. No focal osseous lesion. No joint dislocation. Plantar calcaneal spur. SOFT TISSUES: Mild soft tissue swelling, most prominent laterally. IMPRESSION: 1. No acute osseous abnormality. 2. Mild soft tissue swelling. Electronically signed by: Pinkie Pebbles MD 05/02/2024 11:41 PM EDT RP Workstation: [id number]     Procedures   Medications Ordered in the  ED  oxyCODONE -acetaminophen  (PERCOCET/ROXICET) 5-325 MG per tablet 2 tablet (has no administration in time range)                                    Medical Decision Making Amount and/or Complexity of Data Reviewed Radiology: ordered.  Risk Prescription drug management.   X-rays are negative for fracture.  Patient to be treated as a sprain with immobilization, rest, ice, elevation.  She has to follow-up with her primary doctor or orthopedics if not improving in the next week.     Final  diagnoses:  None    ED Discharge Orders     None          Geroldine Berg, MD 05/03/24 [phone]

## 2024-05-04 ENCOUNTER — Encounter: Payer: Self-pay | Admitting: Oncology

## 2024-05-04 NOTE — Telephone Encounter (Signed)
**Note De-identified  [place] Obfuscation** Please advise 

## 2024-05-07 ENCOUNTER — Ambulatory Visit: Payer: Self-pay | Admitting: Family Medicine

## 2024-05-07 ENCOUNTER — Other Ambulatory Visit: Payer: Self-pay | Admitting: Medical

## 2024-05-07 ENCOUNTER — Ambulatory Visit
Admission: RE | Admit: 2024-05-07 | Discharge: 2024-05-07 | Disposition: A | Source: Ambulatory Visit | Attending: Medical

## 2024-05-07 DIAGNOSIS — S82841A Displaced bimalleolar fracture of right lower leg, initial encounter for closed fracture: Secondary | ICD-10-CM

## 2024-05-08 ENCOUNTER — Emergency Department (HOSPITAL_BASED_OUTPATIENT_CLINIC_OR_DEPARTMENT_OTHER): Payer: Self-pay

## 2024-05-08 ENCOUNTER — Inpatient Hospital Stay (HOSPITAL_COMMUNITY)
Admission: EM | Admit: 2024-05-08 | Discharge: 2024-05-15 | DRG: 493 | Disposition: A | Discharge status: Skilled Nursing Facility | Payer: Self-pay | Source: Ambulatory Visit | Attending: Internal Medicine | Admitting: Internal Medicine

## 2024-05-08 ENCOUNTER — Encounter (HOSPITAL_COMMUNITY): Payer: Self-pay | Admitting: *Deleted

## 2024-05-08 ENCOUNTER — Other Ambulatory Visit: Payer: Self-pay

## 2024-05-08 ENCOUNTER — Emergency Department (HOSPITAL_COMMUNITY)

## 2024-05-08 DIAGNOSIS — D500 Iron deficiency anemia secondary to blood loss (chronic): Secondary | ICD-10-CM | POA: Diagnosis present

## 2024-05-08 DIAGNOSIS — Z7901 Long term (current) use of anticoagulants: Secondary | ICD-10-CM

## 2024-05-08 DIAGNOSIS — Z818 Family history of other mental and behavioral disorders: Secondary | ICD-10-CM

## 2024-05-08 DIAGNOSIS — I2510 Atherosclerotic heart disease of native coronary artery without angina pectoris: Secondary | ICD-10-CM | POA: Diagnosis present

## 2024-05-08 DIAGNOSIS — S92314A Nondisplaced fracture of first metatarsal bone, right foot, initial encounter for closed fracture: Secondary | ICD-10-CM | POA: Diagnosis present

## 2024-05-08 DIAGNOSIS — S93621A Sprain of tarsometatarsal ligament of right foot, initial encounter: Secondary | ICD-10-CM

## 2024-05-08 DIAGNOSIS — S82841A Displaced bimalleolar fracture of right lower leg, initial encounter for closed fracture: Principal | ICD-10-CM | POA: Diagnosis present

## 2024-05-08 DIAGNOSIS — Z602 Problems related to living alone: Secondary | ICD-10-CM | POA: Diagnosis present

## 2024-05-08 DIAGNOSIS — Z8249 Family history of ischemic heart disease and other diseases of the circulatory system: Secondary | ICD-10-CM

## 2024-05-08 DIAGNOSIS — Z888 Allergy status to other drugs, medicaments and biological substances status: Secondary | ICD-10-CM

## 2024-05-08 DIAGNOSIS — W109XXA Fall (on) (from) unspecified stairs and steps, initial encounter: Secondary | ICD-10-CM | POA: Diagnosis present

## 2024-05-08 DIAGNOSIS — G40909 Epilepsy, unspecified, not intractable, without status epilepticus: Secondary | ICD-10-CM | POA: Diagnosis present

## 2024-05-08 DIAGNOSIS — R339 Retention of urine, unspecified: Secondary | ICD-10-CM | POA: Diagnosis present

## 2024-05-08 DIAGNOSIS — N179 Acute kidney failure, unspecified: Secondary | ICD-10-CM | POA: Diagnosis present

## 2024-05-08 DIAGNOSIS — Z86711 Personal history of pulmonary embolism: Secondary | ICD-10-CM

## 2024-05-08 DIAGNOSIS — Z8419 Family history of other disorders of kidney and ureter: Secondary | ICD-10-CM

## 2024-05-08 DIAGNOSIS — M79661 Pain in right lower leg: Secondary | ICD-10-CM

## 2024-05-08 DIAGNOSIS — Z9049 Acquired absence of other specified parts of digestive tract: Secondary | ICD-10-CM

## 2024-05-08 DIAGNOSIS — I10 Essential (primary) hypertension: Secondary | ICD-10-CM | POA: Diagnosis present

## 2024-05-08 DIAGNOSIS — E785 Hyperlipidemia, unspecified: Secondary | ICD-10-CM | POA: Diagnosis present

## 2024-05-08 DIAGNOSIS — Z79899 Other long term (current) drug therapy: Secondary | ICD-10-CM

## 2024-05-08 DIAGNOSIS — Z86718 Personal history of other venous thrombosis and embolism: Secondary | ICD-10-CM

## 2024-05-08 DIAGNOSIS — Z881 Allergy status to other antibiotic agents status: Secondary | ICD-10-CM

## 2024-05-08 DIAGNOSIS — Z820 Family history of epilepsy and other diseases of the nervous system: Secondary | ICD-10-CM

## 2024-05-08 DIAGNOSIS — S92324A Nondisplaced fracture of second metatarsal bone, right foot, initial encounter for closed fracture: Secondary | ICD-10-CM | POA: Diagnosis present

## 2024-05-08 DIAGNOSIS — R197 Diarrhea, unspecified: Secondary | ICD-10-CM | POA: Diagnosis not present

## 2024-05-08 DIAGNOSIS — Z91041 Radiographic dye allergy status: Secondary | ICD-10-CM

## 2024-05-08 LAB — CBC WITH DIFFERENTIAL/PLATELET
Abs Immature Granulocytes: 0.04 K/uL (ref 0.00–0.07)
Basophils Absolute: 0 K/uL (ref 0.0–0.1)
Basophils Relative: 0 %
Eosinophils Absolute: 0.2 K/uL (ref 0.0–0.5)
Eosinophils Relative: 2 %
HCT: 33.4 % — ABNORMAL LOW (ref 36.0–46.0)
Hemoglobin: 11.1 g/dL — ABNORMAL LOW (ref 12.0–15.0)
Immature Granulocytes: 1 %
Lymphocytes Relative: 22 %
Lymphs Abs: 1.6 K/uL (ref 0.7–4.0)
MCH: 31 pg (ref 26.0–34.0)
MCHC: 33.2 g/dL (ref 30.0–36.0)
MCV: 93.3 fL (ref 80.0–100.0)
Monocytes Absolute: 0.4 K/uL (ref 0.1–1.0)
Monocytes Relative: 6 %
Neutro Abs: 5.2 K/uL (ref 1.7–7.7)
Neutrophils Relative %: 69 %
Platelets: 305 K/uL (ref 150–400)
RBC: 3.58 MIL/uL — ABNORMAL LOW (ref 3.87–5.11)
RDW: 13.2 % (ref 11.5–15.5)
WBC: 7.4 K/uL (ref 4.0–10.5)
nRBC: 0 % (ref 0.0–0.2)

## 2024-05-08 LAB — BASIC METABOLIC PANEL WITH GFR
Anion gap: 13 (ref 5–15)
BUN: 49 mg/dL — ABNORMAL HIGH (ref 8–23)
CO2: 23 mmol/L (ref 22–32)
Calcium: 9.3 mg/dL (ref 8.9–10.3)
Chloride: 104 mmol/L (ref 98–111)
Creatinine, Ser: 1.37 mg/dL — ABNORMAL HIGH (ref 0.44–1.00)
GFR, Estimated: 43 mL/min — ABNORMAL LOW (ref >60)
Glucose, Bld: 108 mg/dL — ABNORMAL HIGH (ref 70–99)
Potassium: 4.5 mmol/L (ref 3.5–5.1)
Sodium: 140 mmol/L (ref 135–145)

## 2024-05-08 LAB — PROTIME-INR
INR: 1.1 (ref 0.8–1.2)
Prothrombin Time: 14.4 s (ref 11.4–15.2)

## 2024-05-08 MED ORDER — ONDANSETRON HCL 4 MG PO TABS: 4.0000 mg | ORAL_TABLET | Freq: Four times a day (QID) | ORAL | Status: DC | PRN

## 2024-05-08 MED ORDER — VANCOMYCIN HCL IN DEXTROSE 1-5 GM/200ML-% IV SOLN
1000.0000 mg | INTRAVENOUS | Status: DC
Start: 2024-05-09 — End: 2024-05-10
  Filled 2024-05-08: qty 200

## 2024-05-08 MED ORDER — BISACODYL 5 MG PO TBEC: 5.0000 mg | DELAYED_RELEASE_TABLET | Freq: Every day | ORAL | Status: DC | PRN

## 2024-05-08 MED ORDER — PHENOBARBITAL 32.4 MG PO TABS
129.6000 mg | ORAL_TABLET | Freq: Every day | ORAL | Status: DC
  Administered 2024-05-08 – 2024-05-14 (×7): 129.6 mg via ORAL
  Filled 2024-05-08 (×7): qty 4

## 2024-05-08 MED ORDER — MORPHINE SULFATE (PF) 4 MG/ML IV SOLN
4.0000 mg | Freq: Once | INTRAVENOUS | Status: AC
  Administered 2024-05-08: 4 mg via INTRAVENOUS
  Filled 2024-05-08: qty 1

## 2024-05-08 MED ORDER — SENNOSIDES-DOCUSATE SODIUM 8.6-50 MG PO TABS: 1.0000 | ORAL_TABLET | Freq: Every evening | ORAL | Status: DC | PRN

## 2024-05-08 MED ORDER — LEVOFLOXACIN IN D5W 500 MG/100ML IV SOLN
500.0000 mg | INTRAVENOUS | Status: DC
  Filled 2024-05-08 (×2): qty 100

## 2024-05-08 MED ORDER — HYDROCODONE-ACETAMINOPHEN 5-325 MG PO TABS
1.0000 | ORAL_TABLET | ORAL | Status: DC | PRN
  Administered 2024-05-08 – 2024-05-09 (×4): 1 via ORAL
  Administered 2024-05-10 – 2024-05-11 (×4): 2 via ORAL
  Administered 2024-05-11: 1 via ORAL
  Administered 2024-05-11 – 2024-05-12 (×4): 2 via ORAL
  Administered 2024-05-12 (×2): 1 via ORAL
  Administered 2024-05-12 – 2024-05-13 (×6): 2 via ORAL
  Administered 2024-05-14: 1 via ORAL
  Administered 2024-05-14 – 2024-05-15 (×6): 2 via ORAL
  Filled 2024-05-08 (×6): qty 2
  Filled 2024-05-08: qty 1
  Filled 2024-05-08: qty 2
  Filled 2024-05-08 (×2): qty 1
  Filled 2024-05-08 (×2): qty 2
  Filled 2024-05-08: qty 1
  Filled 2024-05-08 (×2): qty 2
  Filled 2024-05-08: qty 1
  Filled 2024-05-08 (×12): qty 2

## 2024-05-08 MED ORDER — ACETAMINOPHEN 325 MG PO TABS
650.0000 mg | ORAL_TABLET | Freq: Four times a day (QID) | ORAL | Status: DC | PRN
  Administered 2024-05-09 – 2024-05-10 (×2): 650 mg via ORAL
  Filled 2024-05-08 (×2): qty 2

## 2024-05-08 MED ORDER — METHOCARBAMOL 500 MG PO TABS
500.0000 mg | ORAL_TABLET | Freq: Three times a day (TID) | ORAL | Status: DC | PRN
  Administered 2024-05-08 – 2024-05-15 (×15): 500 mg via ORAL
  Filled 2024-05-08 (×16): qty 1

## 2024-05-08 MED ORDER — ONDANSETRON HCL 4 MG/2ML IJ SOLN: 4.0000 mg | Freq: Four times a day (QID) | INTRAMUSCULAR | Status: DC | PRN

## 2024-05-08 MED ORDER — ONDANSETRON HCL 4 MG/2ML IJ SOLN
4.0000 mg | Freq: Once | INTRAMUSCULAR | Status: AC
  Administered 2024-05-08: 4 mg via INTRAVENOUS
  Filled 2024-05-08: qty 2

## 2024-05-08 MED ORDER — SODIUM CHLORIDE 0.9 % IV BOLUS
500.0000 mL | Freq: Once | INTRAVENOUS | Status: AC
  Administered 2024-05-08: 500 mL via INTRAVENOUS

## 2024-05-08 MED ORDER — ACETAMINOPHEN 650 MG RE SUPP: 650.0000 mg | Freq: Four times a day (QID) | RECTAL | Status: DC | PRN

## 2024-05-08 MED ORDER — CHLORHEXIDINE GLUCONATE 4 % EX SOLN
60.0000 mL | Freq: Once | CUTANEOUS | Status: AC
  Administered 2024-05-09: 4 via TOPICAL

## 2024-05-08 NOTE — Hospital Course (Signed)
 Sarah Chung is a 63 y.o. 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

## 2024-05-08 NOTE — Telephone Encounter (Signed)
**Note De-identified  [place] Obfuscation** Please advise 

## 2024-05-08 NOTE — H&P (Signed)
 History and Physical    Sarah Chung FMW:[id number] DOB: January 22, 1961 DOA: 05/08/2024  PCP: Jodie Lavern CROME, MD  Patient coming from: Home  I have personally briefly reviewed patient's old medical records in [facility]  Chief Complaint: Right ankle fracture  HPI: Sarah Chung is a 63 y.o. 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  Patient reports a history of lumbar radiculopathy with chronic foot drop involving both feet, worse on the left compared to the right.  She was admitted July 2025 and underwent lumbar laminectomy L4-5, L5-S1 by Dr. Joshua on 02/25/2024.  Perioperatively her Xarelto  was held and an IVC filter was placed.  Postoperatively she recovered well.  IVC filter was removed on 03/20/2024.  She has been working with PT at her home.  She says while walking up the stairs last week her ankle twisted and gave way resulting in significant pain.  She was seen in [facility], ED on 05/02/2024.  Ankle x-ray was negative for acute fracture.  She had persistent pain and swelling and was seen in the [facility] clinic for follow-up.  X-rays did show a right ankle fracture.    She had follow-up CT of the right ankle yesterday 9/29.  This showed acute bimalleolar fracture of the right ankle, additional fractures of the 1st and 2nd tarsometatarsal joints indicated for Lisfranc ligament injury.  Tiny cortical avulsion fragments along the lateral talus and small bony fragment along the lateral calcaneal body also noted.  She saw Dr. Barton in the [facility] clinic today.  He recommended that she come to the [facility] ED to be admitted under the hospitalist service with plan for surgery while in hospital.  Patient states she last took her Xarelto  yesterday evening 9/29.  She  denies obvious bleeding.  She has not had any chest pain or dyspnea.  ED Course  Labs/Imaging on admission: I have personally reviewed following labs and imaging studies.  Initial vitals showed BP 131/69, pulse 86, RR 17, temp 98.4 F, SpO2 98% on room air.  Labs show WBC 7.4, hemoglobin 11.1, platelets 305, sodium 140, potassium 4.5, bicarb 23, BUN 49, creatinine 1.37, serum glucose 108.  2 view chest x-ray showed hypoinflation with bibasilar linear atelectasis.  RLE venous ultrasound preliminary read is negative for DVT.  Patient was given 500 cc normal saline.  The hospitalist service was consulted for admission.  Review of Systems: All systems reviewed and are negative except as documented in history of present illness above.   Past Medical History:  Diagnosis Date   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   Allergy    Anxiety    Calculus of gallbladder with acute cholecystitis, without mention of obstruction 07/03/2013   Clotting disorder    Coronary artery disease    Depression    DVT (deep venous thrombosis) (HCC)    LLE post-c-section 1993, unprovoked RLE 02/2019   Gallstones    Hyperlipidemia    Hypertension    Pulmonary embolism (HCC) 02/08/2019   Seizures (HCC)     Past Surgical History:  Procedure Laterality Date   CHOLECYSTECTOMY N/A 07/02/2013   Procedure: LAPAROSCOPIC CHOLECYSTECTOMY WITH INTRAOPERATIVE CHOLANGIOGRAM;  Surgeon: Elon CHRISTELLA Pacini, MD;  Location: [facility] ORS;  Service: General;  Laterality: N/A;   IR IVC FILTER PLMT / S&I PORTER GUID/MOD SED  02/13/2024  IR IVC FILTER RETRIEVAL / S&I /IMG GUID/MOD SED  03/20/2024   IR RADIOLOGIST EVAL & MGMT  02/02/2024   IR RADIOLOGIST EVAL & MGMT  03/14/2024   LEFT HEART CATH AND CORONARY ANGIOGRAPHY N/A 04/20/2019   Procedure: LEFT HEART CATH AND CORONARY ANGIOGRAPHY;  Surgeon: Claudene Victory ORN, MD;  Location: [facility] INVASIVE CV LAB;  Service: Cardiovascular;  Laterality: N/A;   LUMBAR LAMINECTOMY/DECOMPRESSION MICRODISCECTOMY N/A  02/24/2024   Procedure: Laminectomy and Foraminotomy - Lumbar Four-Lumbar Five - Lumbar Five-Sacral One;  Surgeon: Joshua Alm Hamilton, MD;  Location: [facility] OR;  Service: Neurosurgery;  Laterality: N/A;  Laminectomy and Foraminotomy  - L4-L5 - L5-S1    Social History: Social History   Tobacco Use   Smoking status: Never   Smokeless tobacco: Never  Vaping Use   Vaping status: Never Used  Substance Use Topics   Alcohol use: No   Drug use: No   Allergies  Allergen Reactions   Carbamazepine Hives and Rash   Ace Inhibitors Cough   Cephalexin     Other Reaction(s): feel crazy   Gadolinium Derivatives     unknown   Iodinated Contrast Media Nausea Only   Ketorolac     Other Reaction(s): unknown   Ketorolac Tromethamine     unknown   Levetiracetam     Feels outside her body.SABRAloopy  Other Reaction(s): feels outside her body (loopy)   Lisinopril      Other Reaction(s): cough   Lisinopril -Hydrochlorothiazide  Cough   Sulfa Antibiotics Hives and Rash    Family History  Problem Relation Age of Onset   Heart attack Father    Parkinson's disease Father    Heart disease Father    Heart disease Brother    Heart attack Brother    Mental illness Mother    Yvone' disease Mother    Depression Sister    Kidney disease Maternal Grandfather    Heart attack Paternal Grandmother      Prior to Admission medications   Medication Sig Start Date End Date Taking? Authorizing Provider  methocarbamol  (ROBAXIN ) 500 MG tablet Take 1 tablet (500 mg total) by mouth every 8 (eight) hours as needed for muscle spasms. 02/27/24  Yes Joshua Alm Hamilton, MD  olmesartan -hydrochlorothiazide  (BENICAR  HCT) 40-25 MG tablet Take 1 tablet by mouth daily. Patient taking differently: Take 1 tablet by mouth every evening. 08/04/23  Yes Jodie Lavern CROME, MD  ondansetron  (ZOFRAN -ODT) 8 MG disintegrating tablet Take 1 tablet (8 mg total) by mouth every 8 (eight) hours as needed for nausea or vomiting. 04/05/24  Yes Kennyth [name], MD  oxyCODONE  (OXY IR/ROXICODONE ) 5 MG immediate release tablet Take 5 mg by mouth every 6 (six) hours as needed for moderate pain (pain score 4-6). 05/04/24  Yes [provider]  PHENobarbital  (LUMINAL) 64.8 MG tablet Take 2 tablets (129.6 mg total) by mouth at bedtime. 11/08/23  Yes Onita Duos, MD  XARELTO  20 MG TABS tablet TAKE 1 TABLET(20 MG) BY MOUTH DAILY WITH SUPPER 05/09/23  Yes Okey Vina GAILS, MD  HYDROcodone -acetaminophen  (NORCO/VICODIN) 5-325 MG tablet Take 1 tablet by mouth every 4 (four) hours as needed for moderate pain (pain score 4-6). Patient not taking: Reported on 05/08/2024 02/27/24   Joshua Alm Hamilton, MD  meclizine  (ANTIVERT ) 25 MG tablet Take 1 tablet (25 mg total) by mouth 3 (three) times daily as needed for dizziness. Patient not taking: Reported on 05/08/2024 04/05/24   Kennyth [name], MD  oxyCODONE -acetaminophen  (PERCOCET) 5-325 MG tablet Take 1-2 tablets  by mouth every 6 (six) hours as needed. Patient not taking: Reported on 05/08/2024 05/03/24   Geroldine Berg, MD    Physical Exam: Vitals:   05/08/24 1302 05/08/24 1752 05/08/24 1800 05/08/24 1943  BP: 131/69 121/77 (!) 113/101 118/60  Pulse: 86 80 82   Resp: 17 16    Temp: 98.4 F (36.9 C) (!) 97.3 F (36.3 C)    TempSrc:  Oral    SpO2: 98% 100% 100%    Constitutional: Resting in bed, NAD, calm, comfortable Eyes: EOMI, lids and conjunctivae normal ENMT: Mucous membranes are moist. Posterior pharynx clear of any exudate or lesions.Normal dentition.  Neck: normal, supple, no masses. Respiratory: clear to auscultation bilaterally, no wheezing, no crackles. Normal respiratory effort. No accessory muscle use.  Cardiovascular: Regular rate and rhythm, no murmurs / rubs / gallops. No extremity edema. 2+ pedal pulses. Abdomen: no tenderness, no masses palpated. Musculoskeletal: Cam boot in place to RLE.  Left ankle sleeve in place. Skin: no rashes, lesions, ulcers. No induration Neurologic: Sensation  intact. Strength equal bilaterally. Psychiatric: Normal judgment and insight. Alert and oriented x 3. Normal mood.   EKG: Personally reviewed. Sinus rhythm, rate 98, no acute ischemic changes.  Assessment/Plan Principal Problem:   Bimalleolar ankle fracture, right, closed, initial encounter Active Problems:   Seizure disorder (HCC)   Chronic anticoagulation   Essential hypertension   KALEA PERINE is a 63 y.o. 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  Assessment and Plan: Acute bimalleolar right ankle fracture Lisfranc fracture second metatarsal base: Sent from [facility] clinic by Dr. Barton for admission and surgery, potentially tomorrow 10/1 pending OR availability.  Cam boot in place.  Will keep n.p.o. tonight and hold Xarelto  tonight.  Last Xarelto  dose was 9/29 PM per patient.  No need for bridging anticoagulation, would resume Xarelto  as soon as able postoperatively.  History of PE/DVT: Patient with history of saddle PE in 2020.  She is on lifelong Xarelto .  Xarelto  briefly held in July for lumbar spine surgery.  IVC filter was placed and has since been removed and she is back on Xarelto .  No recent chest pain or dyspnea.  RLE venous ultrasound negative for DVT.  Will hold Xarelto  preoperatively.  No need for bridging anticoagulation or replacement of IVC filter.  Resume Xarelto  postoperatively when cleared by orthopedics.  Mild renal insufficiency: Creatinine 1.37 compared to baseline ~1.00.  She was given 500 cc normal saline bolus.  Will hold olmesartan -HCTZ and repeat labs in AM.  Hypertension: Blood pressure stable, holding olmesartan -HCTZ as above.  Seizure disorder: Continue phenobarbital .   DVT prophylaxis: SCDs Start: 05/08/24 1926 Code Status: Full code, confirmed with patient on  admission Family Communication: Discussed with patient, she has discussed with family Disposition Plan: From home, dispo pending clinical progress Consults called: Orthopedics Severity of Illness: The appropriate patient status for this patient is OBSERVATION. Observation status is judged to be reasonable and necessary in order to provide the required intensity of service to ensure the patient's safety. The patient's presenting symptoms, physical exam findings, and initial radiographic and laboratory data in the context of their medical condition is felt to place them at decreased risk for further clinical deterioration. Furthermore, it is anticipated that the patient will be medically stable for discharge from the hospital within 2 midnights of admission.   Jorie Blanch MD  [facility]  If 7PM-7AM, please contact night-coverage [email or web address]  05/08/2024, 8:04 PM

## 2024-05-08 NOTE — Care Plan (Signed)
 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 The risks due to hardware failure/irritation, new/persistent/recurrent infection, stiffness, nerve/vessel/tendon injury, nonunion/malunion of any fracture, wound healing issues, allograft usage, development of arthritis, failure of this surgery, possibility of external fixation in certain situations, possibility of delayed definitive surgery, need for further surgery, prolonged wound care including further soft tissue coverage procedures, thromboembolic events, anesthesia/medical complications/events perioperatively and beyond, amputation, death among others were discussed. The patient acknowledged the explanation, agreed to proceed with the plan.

## 2024-05-08 NOTE — ED Triage Notes (Signed)
 Pt was seen by [facility] today due to pain and swelling in right leg, she is going to have ankle and they advised her that she needs r/o DVT as pt has hx of PE and is on xarelto . No sob.

## 2024-05-08 NOTE — ED Provider Notes (Signed)
 Sarah Chung EMERGENCY DEPARTMENT AT [facility] Provider Note   CSN: [id number] Arrival date & time: 05/08/24  1254     Patient presents with: Ankle Pain and Possible DVT   Sarah Chung is a 63 y.o. female.   Pt is a 63 yo female with pmhx significant for seizures, PE/DVT (on Xarelto ), HLD, HTN, anxiety, and CAD.  Pt fell on 9/24.  She hurt her ankle while going up some steps.  She had an xray which was read as normal.  She has chronic left foot weakness due to a back injury.  She has been unable to put any weight on her ankle and followed up with ortho.  She saw ortho on 9/26 and xrays done there did show a bimalleolar fracture.  She had a ct of her ankle yesterday which showed the bimalleolar fracture and fx c/w a Lisfranc injury.  She followed up with ortho today and was told to come in here to be admitted.  When she had back surgery a few months ago, she had a IVC filter placed and then removed on 8/12.  She has remained on Xarelto .          Prior to Admission medications   Medication Sig Start Date End Date Taking? Authorizing Provider  methocarbamol  (ROBAXIN ) 500 MG tablet Take 1 tablet (500 mg total) by mouth every 8 (eight) hours as needed for muscle spasms. 02/27/24  Yes Sarah Alm Hamilton, MD  olmesartan -hydrochlorothiazide  (BENICAR  HCT) 40-25 MG tablet Take 1 tablet by mouth daily. Patient taking differently: Take 1 tablet by mouth every evening. 08/04/23  Yes Sarah Lavern CROME, MD  ondansetron  (ZOFRAN -ODT) 8 MG disintegrating tablet Take 1 tablet (8 mg total) by mouth every 8 (eight) hours as needed for nausea or vomiting. 04/05/24  Yes Sarah Worth CHRISTELLA, MD  oxyCODONE  (OXY IR/ROXICODONE ) 5 MG immediate release tablet Take 5 mg by mouth every 6 (six) hours as needed for moderate pain (pain score 4-6). 05/04/24  Yes [provider]  PHENobarbital  (LUMINAL) 64.8 MG tablet Take 2 tablets (129.6 mg total) by mouth at bedtime. 11/08/23  Yes Sarah Duos, MD  XARELTO  20 MG  TABS tablet TAKE 1 TABLET(20 MG) BY MOUTH DAILY WITH SUPPER 05/09/23  Yes Sarah Vina GAILS, MD  HYDROcodone -acetaminophen  (NORCO/VICODIN) 5-325 MG tablet Take 1 tablet by mouth every 4 (four) hours as needed for moderate pain (pain score 4-6). Patient not taking: Reported on 05/08/2024 02/27/24   Sarah Alm Hamilton, MD  meclizine  (ANTIVERT ) 25 MG tablet Take 1 tablet (25 mg total) by mouth 3 (three) times daily as needed for dizziness. Patient not taking: Reported on 05/08/2024 04/05/24   Parker, Sarah M, MD  oxyCODONE -acetaminophen  (PERCOCET) 5-325 MG tablet Take 1-2 tablets by mouth every 6 (six) hours as needed. Patient not taking: Reported on 05/08/2024 05/03/24   Sarah Berg, MD    Allergies: Carbamazepine, Ace inhibitors, Cephalexin, Gadolinium derivatives, Iodinated contrast media, Ketorolac, Ketorolac tromethamine, Levetiracetam, Lisinopril , Lisinopril -hydrochlorothiazide , and Sulfa antibiotics    Review of Systems  Musculoskeletal:        Right ankle and leg pain  All other systems reviewed and are negative.   Updated Vital Signs BP 121/77 (BP Location: Left Arm)   Pulse 80   Temp (!) 97.3 F (36.3 C) (Oral)   Resp 16   SpO2 100%   Physical Exam Vitals and nursing note reviewed.  Constitutional:      Appearance: Normal appearance.  HENT:     Head: Normocephalic and  atraumatic.     Right Ear: External ear normal.     Left Ear: External ear normal.     Nose: Nose normal.     Mouth/Throat:     Mouth: Mucous membranes are moist.     Pharynx: Oropharynx is clear.  Eyes:     Extraocular Movements: Extraocular movements intact.     Conjunctiva/sclera: Conjunctivae normal.     Pupils: Pupils are equal, round, and reactive to light.  Cardiovascular:     Rate and Rhythm: Normal rate and regular rhythm.     Pulses: Normal pulses.     Heart sounds: Normal heart sounds.  Pulmonary:     Effort: Pulmonary effort is normal.     Breath sounds: Normal breath sounds.  Abdominal:      General: Abdomen is flat. Bowel sounds are normal.     Palpations: Abdomen is soft.  Musculoskeletal:     Cervical back: Normal range of motion and neck supple.     Comments: Right ankle swelling; right leg swelling; in cam boot  Skin:    General: Skin is warm.     Capillary Refill: Capillary refill takes less than 2 seconds.  Neurological:     General: No focal deficit present.     Mental Status: She is alert and oriented to person, place, and time.     Comments: Chronic left foot weakness  Psychiatric:        Mood and Affect: Mood normal.        Behavior: Behavior normal.     (all labs ordered are listed, but only abnormal results are displayed) Labs Reviewed  BASIC METABOLIC PANEL WITH GFR - Abnormal; Notable for the following components:      Result Value   Glucose, Bld 108 (*)    BUN 49 (*)    Creatinine, Ser 1.37 (*)    GFR, Estimated 43 (*)    All other components within normal limits  CBC WITH DIFFERENTIAL/PLATELET - Abnormal; Notable for the following components:   RBC 3.58 (*)    Hemoglobin 11.1 (*)    HCT 33.4 (*)    All other components within normal limits  PROTIME-INR    EKG: EKG Interpretation Date/Time:  Tuesday May 08 2024 13:20:40 EDT Ventricular Rate:  98 PR Interval:  160 QRS Duration:  78 QT Interval:  316 QTC Calculation: 403 R Axis:   12  Text Interpretation: Normal sinus rhythm LVH  nonspecific T waves in the inferior leads  Compared with prior EKG from 07/29/2023 Confirmed by Sarah Chung ([id number]) on 05/08/2024 1:33:57 PM  Radiology: VAS US  LOWER EXTREMITY VENOUS (DVT) (7a-7p) Result Date: 05/08/2024  Lower Venous DVT Study Patient Name:  Sarah Chung  Date of Exam:   05/08/2024 Medical Rec #: [id number]    Accession #:    [id number] Date of Birth: [birth year]   Patient Gender: F Patient Age:   82 years Exam Location:  [facility] [place] Procedure:      VAS US  LOWER EXTREMITY VENOUS (DVT) Referring Phys: Sarah Chung  --------------------------------------------------------------------------------  Indications: Pain, and Swelling. Other Indications: Fall at PT last wednesday that resulted in foot and ankle                    fracture. Check for DVT prior to surgery. Risk Factors: Hx of DVT/PE. Anticoagulation: Xarelto . Limitations: Poor ultrasound/tissue interface and pain intolerance. Comparison       Previous exam on 03/14/2024 was positive for chronic DVT in  RLE Study:           PTV Performing Technologist: Jody Hill RVT, RDMS  Examination Guidelines: A complete evaluation includes B-mode imaging, spectral Doppler, color Doppler, and power Doppler as needed of all accessible portions of each vessel. Bilateral testing is considered an integral part of a complete examination. Limited examinations for reoccurring indications may be performed as noted. The reflux portion of the exam is performed with the patient in reverse Trendelenburg.  +---------+---------------+---------+-----------+----------+--------------+ RIGHT    CompressibilityPhasicitySpontaneityPropertiesThrombus Aging +---------+---------------+---------+-----------+----------+--------------+ CFV      Full           Yes      Yes                                 +---------+---------------+---------+-----------+----------+--------------+ SFJ      Full                                                        +---------+---------------+---------+-----------+----------+--------------+ FV Prox  Full           Yes      Yes                                 +---------+---------------+---------+-----------+----------+--------------+ FV Mid   Full           Yes      Yes                                 +---------+---------------+---------+-----------+----------+--------------+ FV DistalFull           Yes      Yes                                 +---------+---------------+---------+-----------+----------+--------------+ PFV      Full                                                         +---------+---------------+---------+-----------+----------+--------------+ POP      Full           Yes      Yes                                 +---------+---------------+---------+-----------+----------+--------------+ PTV      Full                                                        +---------+---------------+---------+-----------+----------+--------------+ PERO     Full                                                        +---------+---------------+---------+-----------+----------+--------------+   +----+---------------+---------+-----------+----------+--------------+  LEFTCompressibilityPhasicitySpontaneityPropertiesThrombus Aging +----+---------------+---------+-----------+----------+--------------+ CFV Full           Yes      Yes                                 +----+---------------+---------+-----------+----------+--------------+     Summary: RIGHT: - There is no evidence of deep vein thrombosis in the lower extremity.  - A cystic structure is found in the popliteal fossa (4.32 x 0.58 x 3.33 cm).  LEFT: - No evidence of common femoral vein obstruction.   *See table(s) above for measurements and observations.    Preliminary    DG Chest 2 View Result Date: 05/08/2024 CLINICAL DATA:  Preop. Fall last week with right foot ankle fracture. EXAM: CHEST - 2 VIEW COMPARISON:  03/04/2022 FINDINGS: Lungs are somewhat hypoinflated with minimal elevation of the right hemidiaphragm. There is bibasilar linear density compatible with atelectasis. No effusion or pneumothorax. Cardiomediastinal silhouette and remainder of the exam is unchanged. IMPRESSION: Hypoinflation with bibasilar linear atelectasis. Electronically Signed   By: Toribio Agreste Chung.D.   On: 05/08/2024 14:20   CT ANKLE RIGHT WO CONTRAST Result Date: 05/07/2024 CLINICAL DATA:  Right ankle fracture EXAM: CT OF THE RIGHT ANKLE WITHOUT CONTRAST TECHNIQUE: Multidetector CT  imaging of the right ankle was performed according to the standard protocol. Multiplanar CT image reconstructions were also generated. RADIATION DOSE REDUCTION: This exam was performed according to the departmental dose-optimization program which includes automated exposure control, adjustment of the mA and/or kV according to patient size and/or use of iterative reconstruction technique. COMPARISON:  X-ray 05/02/2024 FINDINGS: Bones/Joint/Cartilage Acute avulsion fracture of the anterolateral aspect of the tibial plafond with 6 mm of fracture distraction. Nondisplaced vertical fracture along the periphery of the posterior malleolus. Minimally displaced fracture of the tip of the medial malleolus. Ankle mortise is aligned without dislocation. Distal fibula intact without fracture. Tiny cortical avulsion fragments along the lateral margin of the talus, likely secondary to ATFL avulsion injury. Small bony fragment along the lateral margin of the calcaneal body which could reflect calcaneofibular avulsion fragment. Additional fractures centered at the tarsometatarsal joints of the foot including avulsion fracture of the anterolateral margin of the medial cuneiform. Nondisplaced fractures of the second and third metatarsal bases. No TMT joint malalignment. Ligaments Fracture pattern indicative of multi ligamentous injury. Muscles and Tendons No acute musculotendinous abnormality by CT. Soft tissues Soft tissue swelling of the foot and ankle. No organized hematoma. No soft tissue air to suggest open fracture. IMPRESSION: 1. Acute bimalleolar fracture of the right ankle, as described above. 2. Additional fractures centered at the first and second tarsometatarsal joints of the foot indicative of a Lisfranc ligament injury. No TMT joint malalignment. 3. Tiny cortical avulsion fragments along the lateral margin of the talus, likely secondary to ATFL avulsion injury. 4. Small bony fragment along the lateral margin of the  calcaneal body which could reflect calcaneofibular avulsion fragment. Electronically Signed   By: Mabel Converse D.O.   On: 05/07/2024 11:13     Procedures   Medications Ordered in the ED  sodium chloride  0.9 % bolus 500 mL (500 mLs Intravenous New Bag/Given 05/08/24 1749)                                    Medical Decision Making Risk Decision regarding hospitalization.   This patient presents to  the ED for concern of ankle pain, this involves an extensive number of treatment options, and is a complaint that carries with it a high risk of complications and morbidity.  The differential diagnosis includes fx, sprain   Co morbidities that complicate the patient evaluation  eizures, PE/DVT (on Xarelto ), HLD, HTN, anxiety, and CAD   Additional history obtained:  Additional history obtained from epic chart review  Lab Tests:  I Ordered, and personally interpreted labs.  The pertinent results include:  cbc with hgb 11.1 (12.3 on 8/8); bmp with bun 49 and cr 1.37 (26 and 1 on 7/7)   Imaging Studies ordered:  I ordered imaging studies including cxr, LE US   I independently visualized and interpreted imaging which showed  CXR: Hypoinflation with bibasilar linear atelectasis.  US : RIGHT:          - There is no evidence of deep vein thrombosis in the lower extremity.     - A cystic structure is found in the popliteal fossa (4.32 x 0.58 x 3.33 cm).     LEFT:  - No evidence of common femoral vein obstruction.   I agree with the radiologist interpretation  Medicines ordered and prescription drug management:  I ordered medication including ivfs  for sx  Reevaluation of the patient after these medicines showed that the patient improved I have reviewed the patients home medicines and have made adjustments as needed   Critical Interventions:  Pain control/ivfs   Consultations Obtained:  I requested consultation with the hospitalist (Dr. Tobie),  and discussed lab  and imaging findings as well as pertinent plan - he will admit  Problem List / ED Course:  Bimalleolar fx:  mgmt per ortho AKI: pt has not been eating/drinking much.  Pt given ivfs Anemia:  likely from bleeding into the fx.     Reevaluation:  After the interventions noted above, I reevaluated the patient and found that they have :improved   Social Determinants of Health:  Lives alone   Dispostion:  After consideration of the diagnostic results and the patients response to treatment, I feel that the patent would benefit from admission.       Final diagnoses:  Closed bimalleolar fracture of right ankle, initial encounter  Lisfranc's sprain, right, initial encounter  AKI (acute kidney injury)  Anticoagulated on Xarelto     ED Discharge Orders     None          Dean Clarity, MD 05/08/24 [phone]

## 2024-05-08 NOTE — ED Provider Triage Note (Signed)
 Emergency Medicine Provider Triage Evaluation Note  LATARRA EAGLETON , a 63 y.o. female  was evaluated in triage.  Pt complains of leg injury. Pt suffered significant injury to her R ankle on 04/30/2024 which will require surgery.  She also has hx of blood clots on Xarelto .  Pt sent here by ortho for DVT study and hospital admission with consideration for IVC filter and anticipation for surgery  Review of Systems  Positive: As above Negative: As above  Physical Exam  BP 131/69 (BP Location: Right Arm)   Pulse 86   Temp 98.4 F (36.9 C)   Resp 17   SpO2 98%  Gen:   Awake, no distress   Resp:  Normal effort  MSK:   Moves extremities without difficulty  Other:    Medical Decision Making  Medically screening exam initiated at 1:07 PM.  Appropriate orders placed.  Rumalda CHRISTELLA [place] was informed that the remainder of the evaluation will be completed by another provider, this initial triage assessment does not replace that evaluation, and the importance of remaining in the ED until their evaluation is complete.     Nivia Colon, PA-C 05/08/24 1309

## 2024-05-08 NOTE — ED Triage Notes (Signed)
 Pt sent from [facility] after fall last Wednesday that resulted in right foot and ankle fracture. Sent her over for a stat US  for possible DVT in right leg.

## 2024-05-09 ENCOUNTER — Observation Stay (HOSPITAL_COMMUNITY): Admitting: Certified Registered"

## 2024-05-09 ENCOUNTER — Encounter (HOSPITAL_COMMUNITY): Payer: Self-pay | Admitting: Internal Medicine

## 2024-05-09 ENCOUNTER — Other Ambulatory Visit

## 2024-05-09 ENCOUNTER — Encounter (HOSPITAL_COMMUNITY)
Admission: EM | Disposition: A | Discharge status: Skilled Nursing Facility | Payer: Self-pay | Source: Ambulatory Visit | Attending: Internal Medicine

## 2024-05-09 ENCOUNTER — Other Ambulatory Visit: Payer: Self-pay

## 2024-05-09 ENCOUNTER — Observation Stay (HOSPITAL_COMMUNITY)

## 2024-05-09 ENCOUNTER — Inpatient Hospital Stay (HOSPITAL_COMMUNITY)

## 2024-05-09 DIAGNOSIS — S82851A Displaced trimalleolar fracture of right lower leg, initial encounter for closed fracture: Secondary | ICD-10-CM

## 2024-05-09 DIAGNOSIS — F418 Other specified anxiety disorders: Secondary | ICD-10-CM

## 2024-05-09 DIAGNOSIS — I2510 Atherosclerotic heart disease of native coronary artery without angina pectoris: Secondary | ICD-10-CM

## 2024-05-09 DIAGNOSIS — S82841A Displaced bimalleolar fracture of right lower leg, initial encounter for closed fracture: Secondary | ICD-10-CM | POA: Diagnosis present

## 2024-05-09 DIAGNOSIS — I10 Essential (primary) hypertension: Secondary | ICD-10-CM

## 2024-05-09 HISTORY — PX: OPEN REDUCTION INTERNAL FIXATION (ORIF) FOOT LISFRANC FRACTURE: SHX5990

## 2024-05-09 HISTORY — PX: ORIF ANKLE FRACTURE: SHX5408

## 2024-05-09 LAB — BASIC METABOLIC PANEL WITH GFR
Anion gap: 11 (ref 5–15)
BUN: 46 mg/dL — ABNORMAL HIGH (ref 8–23)
CO2: 21 mmol/L — ABNORMAL LOW (ref 22–32)
Calcium: 8.5 mg/dL — ABNORMAL LOW (ref 8.9–10.3)
Chloride: 107 mmol/L (ref 98–111)
Creatinine, Ser: 1.64 mg/dL — ABNORMAL HIGH (ref 0.44–1.00)
GFR, Estimated: 35 mL/min — ABNORMAL LOW (ref >60)
Glucose, Bld: 127 mg/dL — ABNORMAL HIGH (ref 70–99)
Potassium: 4.2 mmol/L (ref 3.5–5.1)
Sodium: 139 mmol/L (ref 135–145)

## 2024-05-09 LAB — HIV ANTIBODY (ROUTINE TESTING W REFLEX): HIV Screen 4th Generation wRfx: NONREACTIVE

## 2024-05-09 LAB — CBC
HCT: 28 % — ABNORMAL LOW (ref 36.0–46.0)
Hemoglobin: 9 g/dL — ABNORMAL LOW (ref 12.0–15.0)
MCH: 30.7 pg (ref 26.0–34.0)
MCHC: 32.1 g/dL (ref 30.0–36.0)
MCV: 95.6 fL (ref 80.0–100.0)
Platelets: 274 K/uL (ref 150–400)
RBC: 2.93 MIL/uL — ABNORMAL LOW (ref 3.87–5.11)
RDW: 13.5 % (ref 11.5–15.5)
WBC: 6.2 K/uL (ref 4.0–10.5)
nRBC: 0 % (ref 0.0–0.2)

## 2024-05-09 LAB — SURGICAL PCR SCREEN
MRSA, PCR: NEGATIVE
Staphylococcus aureus: NEGATIVE

## 2024-05-09 SURGERY — OPEN REDUCTION INTERNAL FIXATION (ORIF) ANKLE FRACTURE
Anesthesia: Regional | Site: Foot | Laterality: Right

## 2024-05-09 MED ORDER — BUPIVACAINE HCL (PF) 0.25 % IJ SOLN
INTRAMUSCULAR | Status: DC | PRN
  Administered 2024-05-09: 20 mL via PERINEURAL

## 2024-05-09 MED ORDER — OXYCODONE HCL 5 MG PO TABS: 5.0000 mg | ORAL_TABLET | Freq: Once | ORAL | Status: DC | PRN

## 2024-05-09 MED ORDER — OXYCODONE HCL 5 MG PO TABS: 5.0000 mg | ORAL_TABLET | ORAL | 0 refills | Status: AC | PRN

## 2024-05-09 MED ORDER — PHENYLEPHRINE 80 MCG/ML (10ML) SYRINGE FOR IV PUSH (FOR BLOOD PRESSURE SUPPORT)
PREFILLED_SYRINGE | INTRAVENOUS | Status: DC | PRN
  Administered 2024-05-09: 80 ug via INTRAVENOUS

## 2024-05-09 MED ORDER — PHENYLEPHRINE HCL-NACL 20-0.9 MG/250ML-% IV SOLN
INTRAVENOUS | Status: DC | PRN
  Administered 2024-05-09: 30 ug/min via INTRAVENOUS

## 2024-05-09 MED ORDER — MIDAZOLAM HCL 2 MG/2ML IJ SOLN
INTRAMUSCULAR | Status: AC
  Administered 2024-05-09: 2 mg via INTRAVENOUS
  Filled 2024-05-09: qty 2

## 2024-05-09 MED ORDER — FENTANYL CITRATE (PF) 100 MCG/2ML IJ SOLN: 25.0000 ug | INTRAMUSCULAR | Status: DC | PRN

## 2024-05-09 MED ORDER — AMISULPRIDE (ANTIEMETIC) 5 MG/2ML IV SOLN: 10.0000 mg | Freq: Once | INTRAVENOUS | Status: DC | PRN

## 2024-05-09 MED ORDER — FENTANYL CITRATE PF 50 MCG/ML IJ SOSY
12.5000 ug | PREFILLED_SYRINGE | INTRAMUSCULAR | Status: DC | PRN
  Administered 2024-05-09 – 2024-05-10 (×4): 12.5 ug via INTRAVENOUS
  Filled 2024-05-09 (×4): qty 1

## 2024-05-09 MED ORDER — SODIUM CHLORIDE 0.9 % IR SOLN
Status: DC | PRN
  Administered 2024-05-09: 1000 mL

## 2024-05-09 MED ORDER — DEXAMETHASONE SODIUM PHOSPHATE 10 MG/ML IJ SOLN
INTRAMUSCULAR | Status: DC | PRN
Start: 2024-05-09 — End: 2024-05-09
  Administered 2024-05-09: 5 mg via INTRAVENOUS

## 2024-05-09 MED ORDER — MIDAZOLAM HCL 2 MG/2ML IJ SOLN: 2.0000 mg | Freq: Once | INTRAMUSCULAR | Status: AC

## 2024-05-09 MED ORDER — OXYCODONE HCL 5 MG PO TABS: 5.0000 mg | ORAL_TABLET | ORAL | 0 refills | Status: DC | PRN

## 2024-05-09 MED ORDER — CHLORHEXIDINE GLUCONATE 0.12 % MT SOLN
OROMUCOSAL | Status: AC
  Administered 2024-05-09: 15 mL via OROMUCOSAL
  Filled 2024-05-09: qty 15

## 2024-05-09 MED ORDER — BUPIVACAINE-EPINEPHRINE (PF) 0.5% -1:200000 IJ SOLN
INTRAMUSCULAR | Status: DC | PRN
  Administered 2024-05-09: 25 mL via PERINEURAL

## 2024-05-09 MED ORDER — LIDOCAINE 2% (20 MG/ML) 5 ML SYRINGE
INTRAMUSCULAR | Status: DC | PRN
  Administered 2024-05-09: 40 mg via INTRAVENOUS

## 2024-05-09 MED ORDER — LACTATED RINGERS IV SOLN: INTRAVENOUS | Status: DC | PRN

## 2024-05-09 MED ORDER — LEVOFLOXACIN IN D5W 500 MG/100ML IV SOLN
INTRAVENOUS | Status: DC | PRN
Start: 2024-05-09 — End: 2024-05-09
  Administered 2024-05-09: 500 mg via INTRAVENOUS

## 2024-05-09 MED ORDER — VANCOMYCIN HCL 1000 MG IV SOLR
INTRAVENOUS | Status: DC | PRN
  Administered 2024-05-09: 1000 mg via INTRAVENOUS

## 2024-05-09 MED ORDER — OXYCODONE HCL 5 MG/5ML PO SOLN: 5.0000 mg | Freq: Once | ORAL | Status: DC | PRN

## 2024-05-09 MED ORDER — PROPOFOL 10 MG/ML IV BOLUS
INTRAVENOUS | Status: AC
  Filled 2024-05-09: qty 20

## 2024-05-09 MED ORDER — ONDANSETRON HCL 4 MG/2ML IJ SOLN
INTRAMUSCULAR | Status: DC | PRN
  Administered 2024-05-09: 4 mg via INTRAVENOUS

## 2024-05-09 MED ORDER — CEFAZOLIN SODIUM-DEXTROSE 1-4 GM/50ML-% IV SOLN
1.0000 g | Freq: Three times a day (TID) | INTRAVENOUS | Status: AC
Start: 2024-05-09 — End: 2024-05-10
  Administered 2024-05-09 – 2024-05-10 (×3): 1 g via INTRAVENOUS
  Filled 2024-05-09 (×4): qty 50

## 2024-05-09 MED ORDER — VANCOMYCIN HCL 1000 MG IV SOLR
INTRAVENOUS | Status: AC
Start: 2024-05-09 — End: 2024-05-09
  Filled 2024-05-09: qty 20

## 2024-05-09 MED ORDER — LACTATED RINGERS IV SOLN: INTRAVENOUS | Status: DC

## 2024-05-09 MED ORDER — FENTANYL CITRATE (PF) 100 MCG/2ML IJ SOLN
INTRAMUSCULAR | Status: AC
  Administered 2024-05-09: 100 ug
  Filled 2024-05-09: qty 2

## 2024-05-09 MED ORDER — CHLORHEXIDINE GLUCONATE 0.12 % MT SOLN
15.0000 mL | OROMUCOSAL | Status: AC
  Filled 2024-05-09: qty 15

## 2024-05-09 MED ORDER — BUPIVACAINE HCL (PF) 0.5 % IJ SOLN
INTRAMUSCULAR | Status: AC
Start: 2024-05-09 — End: 2024-05-09
  Filled 2024-05-09: qty 30

## 2024-05-09 MED ORDER — MORPHINE SULFATE (PF) 2 MG/ML IV SOLN: 2.0000 mg | INTRAVENOUS | Status: DC | PRN

## 2024-05-09 MED ORDER — PROPOFOL 10 MG/ML IV BOLUS
INTRAVENOUS | Status: DC | PRN
  Administered 2024-05-09: 200 mg via INTRAVENOUS

## 2024-05-09 MED ORDER — FENTANYL CITRATE PF 50 MCG/ML IJ SOSY: 100.0000 ug | PREFILLED_SYRINGE | Freq: Once | INTRAMUSCULAR | Status: DC

## 2024-05-09 SURGICAL SUPPLY — 76 items
ANCHOR SUT KEITH ABD SZ2 STR (SUTURE) ×3 IMPLANT
BAG COUNTER SPONGE SURGICOUNT (BAG) IMPLANT
BANDAGE ESMARK 6X9 LF (GAUZE/BANDAGES/DRESSINGS) ×3 IMPLANT
BIT DRILL 2.5X2.75 QC CALB (BIT) IMPLANT
BIT DRILL 2.9 CANN QC NONSTRL (BIT) IMPLANT
BLADE LONG MED 31X9 (MISCELLANEOUS) ×3 IMPLANT
BLADE SURG 15 STRL LF DISP TIS (BLADE) ×6 IMPLANT
BNDG COHESIVE 4X5 TAN STRL LF (GAUZE/BANDAGES/DRESSINGS) ×3 IMPLANT
BNDG COHESIVE 6X5 TAN NS LF (GAUZE/BANDAGES/DRESSINGS) ×3 IMPLANT
BNDG COHESIVE 6X5 TAN ST LF (GAUZE/BANDAGES/DRESSINGS) ×3 IMPLANT
BNDG COMPR ESMARK 4X3 LF (GAUZE/BANDAGES/DRESSINGS) IMPLANT
BNDG ELASTIC 4X5.8 VLCR STR LF (GAUZE/BANDAGES/DRESSINGS) ×3 IMPLANT
BNDG ELASTIC 6INX 5YD STR LF (GAUZE/BANDAGES/DRESSINGS) ×3 IMPLANT
BNDG ELASTIC 6X10 VLCR STRL LF (GAUZE/BANDAGES/DRESSINGS) IMPLANT
BNDG GAUZE DERMACEA FLUFF 4 (GAUZE/BANDAGES/DRESSINGS) ×3 IMPLANT
CHLORAPREP W/TINT 26 (MISCELLANEOUS) ×6 IMPLANT
COVER SURGICAL LIGHT HANDLE (MISCELLANEOUS) ×3 IMPLANT
CUFF TRNQT CYL 34X4.125X (TOURNIQUET CUFF) ×3 IMPLANT
DRAPE C-ARM 42X120 X-RAY (DRAPES) IMPLANT
DRAPE C-ARM MINI 42X72 WSTRAPS (DRAPES) ×3 IMPLANT
DRAPE C-ARMOR (DRAPES) ×3 IMPLANT
DRAPE EXTREMITY T 121X128X90 (DISPOSABLE) ×3 IMPLANT
DRAPE OEC MINIVIEW 54X84 (DRAPES) ×3 IMPLANT
DRAPE SURG ORHT 6 SPLT 77X108 (DRAPES) ×6 IMPLANT
DRAPE U-SHAPE 47X51 STRL (DRAPES) ×3 IMPLANT
DRIVER RETENTION T15 LONG (ORTHOPEDIC DISPOSABLE SUPPLIES) IMPLANT
DRSG ADAPTIC 3X8 NADH LF (GAUZE/BANDAGES/DRESSINGS) ×3 IMPLANT
DRSG MEPITEL 4X7.2 (GAUZE/BANDAGES/DRESSINGS) IMPLANT
ELECT REM PT RETURN 15FT ADLT (MISCELLANEOUS) ×3 IMPLANT
FACESHIELD WRAPAROUND OR TEAM (MASK) ×3 IMPLANT
GAUZE PAD ABD 8X10 STRL (GAUZE/BANDAGES/DRESSINGS) ×15 IMPLANT
GAUZE SPONGE 4X4 12PLY STRL (GAUZE/BANDAGES/DRESSINGS) ×6 IMPLANT
GAUZE XEROFORM 5X9 LF (GAUZE/BANDAGES/DRESSINGS) ×3 IMPLANT
GLOVE SURG ENC MOIS LTX SZ7.5 (GLOVE) ×6 IMPLANT
GLOVE SURG MICRO LTX SZ7.5 (GLOVE) ×6 IMPLANT
GLOVE SURG POLYISO LF SZ7.5 (GLOVE) ×3 IMPLANT
GLOVE SURG UNDER POLY LF SZ7.5 (GLOVE) ×6 IMPLANT
GOWN STRL REUS W/ TWL LRG LVL3 (GOWN DISPOSABLE) ×3 IMPLANT
KIT BASIN OR (CUSTOM PROCEDURE TRAY) ×3 IMPLANT
KIT TURNOVER KIT B (KITS) IMPLANT
KWIRE ALPS MXV 1.6X6 ZI (WIRE) IMPLANT
NDL HYPO 22X1.5 SAFETY MO (MISCELLANEOUS) ×3 IMPLANT
NDL MAYO CATGUT SZ4 TPR NDL (NEEDLE) IMPLANT
NEEDLE HYPO 22X1.5 SAFETY MO (MISCELLANEOUS) ×2 IMPLANT
NEEDLE MAYO CATGUT SZ4 (NEEDLE) IMPLANT
PACK ORTHO EXTREMITY (CUSTOM PROCEDURE TRAY) ×3 IMPLANT
PAD CAST 4YDX4 CTTN HI CHSV (CAST SUPPLIES) ×18 IMPLANT
PADDING CAST COTTON 6X4 STRL (CAST SUPPLIES) ×3 IMPLANT
PENCIL BUTTON HOLSTER BLD 10FT (ELECTRODE) IMPLANT
PLATE TUBULAR 1/3 4H (Plate) IMPLANT
SCREW ACE CAN 4.0 30M (Screw) IMPLANT
SCREW CANC FT 4X34 (Screw) IMPLANT
SCREW LOCK MDS 3.5X14 (Screw) IMPLANT
SCREW NLOCK 4X55 (Screw) IMPLANT
SCREW NLOCK ALPS 3.5X14 (Screw) IMPLANT
SCREW NLOCK T15 FT 30X4XST TIP (Screw) IMPLANT
SOLN 0.9% NACL 1000 ML (IV SOLUTION) ×2 IMPLANT
SOLN 0.9% NACL POUR BTL 1000ML (IV SOLUTION) ×3 IMPLANT
SOLN STERILE WATER 1000 ML (IV SOLUTION) ×2 IMPLANT
SOLN STERILE WATER BTL 1000 ML (IV SOLUTION) ×3 IMPLANT
SPIKE FLUID TRANSFER (MISCELLANEOUS) IMPLANT
SPLINT FIBERGLASS 4X30 (CAST SUPPLIES) IMPLANT
SPONGE T-LAP 18X18 ~~LOC~~+RFID (SPONGE) ×3 IMPLANT
STAPLER SKIN PROX 35W (STAPLE) ×3 IMPLANT
STOCKINETTE TUBULAR 6 INCH (GAUZE/BANDAGES/DRESSINGS) ×3 IMPLANT
STOCKINETTE TUBULAR COTT 4X25 (GAUZE/BANDAGES/DRESSINGS) ×3 IMPLANT
STRIP CLOSURE SKIN 1/2X4 (GAUZE/BANDAGES/DRESSINGS) ×3 IMPLANT
SUCTION TUBE FRAZIER 10FR DISP (SUCTIONS) ×3 IMPLANT
SUT ETHILON 3 0 PS 1 (SUTURE) ×3 IMPLANT
SUT MON AB 3-0 SH27 (SUTURE) ×3 IMPLANT
SUT VIC AB 2-0 SH 27XBRD (SUTURE) ×3 IMPLANT
SUT VIC AB 3-0 SH 27X BRD (SUTURE) ×6 IMPLANT
SYR CONTROL 10ML LL (SYRINGE) ×3 IMPLANT
TUBE CONNECTING 12X1/4 (SUCTIONS) IMPLANT
WASHER PLAIN FLAT ACE NS 3PK (Orthopedic Implant) IMPLANT
YANKAUER SUCT BULB TIP NO VENT (SUCTIONS) IMPLANT

## 2024-05-09 NOTE — Progress Notes (Signed)
 Report in handoff that patient has not voided on unit, last void in ED prior to inpatient admission. On approach to room, patient reports no urge to void, endorsing severe pain to RIGHT ankle fracture site. A bladder scan was performed, revealing >951mL retention. Dr. Rojelio promptly notified and RN requested additional pain medication and Straight Cath Protocol order for patient.   Patient educated regarding unrelieved urinary retention and potential complications, as well as potential triggers against voiding (hospitalization, narcotic influence, pain, bed-bound status, loss of independence). Patient requesting to be allowed a voiding trial. On return to room at 0900, patient reports voiding a small amount, new bladder scan renention. Depends are slightly wet. Patient refused straight cath again and requested one more hour for voiding.   On return at 1020, Dr Rojelio in room. Informed MD was awaiting void/straight cath prior to hanging IVF, MD agreeable. After rounds, patient again bladder scanned and revealed retention with depends in place saturated. Patient was informed to continue to try to void as there was still significant retention. IVF administered per order. At this time, patient was additionally medicated for pain and full CHG Bath, full linen change, MRSA Nares swab performed, and povidone-iodine solution swabbed in nose. Purewick replaced and patient educated on use.  On returning to patient at 1315, new bladder scan shows retention with no urine in suction container, linen under patient dry. Straight catheterization performed with 16Fr catheter by this RN, second witness Avelina, Charity fundraiser. Catheterized for clear yellow urine. Patient reports significant relief and appreciative of intervention. Peri care performed, patient covered and call bell returned.

## 2024-05-09 NOTE — Anesthesia Procedure Notes (Signed)
 Anesthesia Regional Block: Adductor canal block   Pre-Anesthetic Checklist: , timeout performed,  Correct Patient, Correct Site, Correct Laterality,  Correct Procedure, Correct Position, site marked,  Risks and benefits discussed,  Surgical consent,  Pre-op evaluation,  At surgeon's request and post-op pain management  Laterality: Right  Prep: chloraprep       Needles:  Injection technique: Single-shot  Needle Type: Echogenic Needle     Needle Length: 9cm  Needle Gauge: 21     Additional Needles:   Procedures:,,,, ultrasound used (permanent image in chart),,    Narrative:  Start time: 05/09/2024 4:36 PM End time: 05/09/2024 4:41 PM Injection made incrementally with aspirations every 5 mL.  Performed by: Personally  Anesthesiologist: Epifanio Charleston, MD

## 2024-05-09 NOTE — Op Note (Signed)
 05/08/2024 - 05/09/2024  6:21 PM   PATIENT: Sarah Chung  63 y.o. female  MRN: [id number]   PRE-OPERATIVE DIAGNOSIS:   1] Right trimalleolar ankle fracture  2] Right Lisfranc fracture subluxation   POST-OPERATIVE DIAGNOSIS:   Same   PROCEDURE: 1] Right bimalleolar ankle ORIF with internal fixation of the lateral and posterior malleolus 2] Right ankle syndesmosis ORIF 3] Right foot Lisfranc joint complex ORIF 4] Right foot intercuneiform joint complex ORIF (medial-intermediate) 5] Right foot closed reduction with manipulation of third metatarsal   SURGEON:  Lillia Mountain, MD   ASSISTANT: None   ANESTHESIA: General, regional   EBL: Minimal   TOURNIQUET:   Total Tourniquet Time Documented: Thigh (Right) - 42 minutes Total: Thigh (Right) - 42 minutes    COMPLICATIONS: None apparent   DISPOSITION: Extubated, awake and stable to recovery.   INDICATION FOR PROCEDURE: The patient presented with above diagnosis.  We discussed the diagnosis, alternative treatment options, risks and benefits of the above surgical intervention, as well as alternative non-operative treatments. All questions/concerns were addressed and the patient/family demonstrated appropriate understanding of the diagnosis, the procedure, the postoperative course, and overall prognosis. The patient wished to proceed with surgical intervention and signed an informed surgical consent as such, in each others presence prior to surgery.   PROCEDURE IN DETAIL: After preoperative consent was obtained and the correct operative site was identified, the patient was brought to the operating room supine on stretcher and transferred onto operating table. General anesthesia was induced. Preoperative antibiotics were administered. Surgical timeout was taken. The patient was then positioned supine with an ipsilateral hip bump. The operative lower extremity was prepped and draped in standard sterile fashion with a  tourniquet around the thigh. The extremity was exsanguinated and the tourniquet was inflated to 275 mmHg.   We then proceeded with reduction and fixation of the anterolateral distal tibia fragment. We confirmed appropriate reduction using intraoperative fluoroscopy. The distal tibia fragment was reduced directly using an anterolateral approach which was made over anterior ankle. Dissection carried down to level of distal tibia taking care throughout to protect the nearby tendons and neurovascular structures. A Kirschner wire was used to provisionally fix the fracture. This was overdrilled with cannulated drill and a 4.0 partially threaded screw was implanted. This was noted to achieve compression across the fracture with excellent congruency of the tibial articular surface on fluoroscopy. Position of this screw was verified carefully using intraoperative fluoroscopy throughout.     The medial malleolus fragment was deemed too minor to receive internal fixation without further comminution.  The posterior malleolus fragment was directly reduced but deemed too minor to receive internal fixation without further comminution.  A separate lateral incision was made and dissection was carried down to the level of the fibula. The superficial peroneal nerve was identified and protected throughout the procedure. It was decided to use a locking distal fibula plate. We then selected a Zimmer locking plate to match the anatomy of the distal fibula and placed it laterally. This was implanted under intraoperative fluoroscopy with a combination of screws.   A manual external rotation stress radiograph was obtained and demonstrated widening of the ankle mortise. Given this intraoperative finding as well as preoperative subluxation, it was decided to reduce and fix the syndesmosis. Therefore a nonlocking quadricortical hex head 4.0 mm screw was implanted through the fibula plate in appropriate fashion to fix the syndesmosis.  Screw position was verified along anteromedial tibial cortex by fluoroscopy. A repeat stress radiograph  showed complete stability of the ankle mortise to testing. A second screw was implanted to reinforce fixation.   Abduction stress testing of the foot demonstrated instability at the Lisfranc complex. A dorsal incision was made to debride the area of the Lisfranc ligament and to place pointed reduction clamp. The midfoot was reduced and this was provisionally pinned with Kirschner wire in the orientation of the native Lisfranc ligament. The intercuneiform joint between the medial and intermedial cuneiforms was also pinned with Kirschner wire.    Medial incisions were made over these wires and dissection carried down to the level of bone. The wires were overdrilled sequentially with a cannulated drill and the wires were removed while maintaining midfoot reduction. Two solid 4.0 fully threaded headed screws were implanted and stability was noted to clinical and fluoroscopic testing.    Next, closed reduction with manipulation of third metatarsal was performed as verified by intraoperative fluoroscopy.  The surgical sites were thoroughly irrigated. The tourniquet was deflated and hemostasis achieved. Betadine and vancomycin powder were applied. The deep layers were closed using 2-0 vicryl. The skin was closed without tension.    The leg was cleaned with saline and sterile dressings with gauze were applied. A well padded bulky short leg splint was applied. The patient was awakened from anesthesia and transported to the recovery room in stable condition.      FOLLOW UP PLAN: -transfer to PACU, then return to RNF  -strict NWB operative extremity, maximum elevation -postop IV abx ordered -PT/OT for NWB training -pain rx printed and placed in chart -maintain short leg splint until follow up -DVT ppx: Per primary team/hematology preference. She previously had IVC filter until very recently for PE. She  may resume anticoagulation POD1 or whenever deemed appropriate by primary team -follow up as outpatient within 7-10 days for wound check with exchange of short leg splint to short leg cast -sutures out in 2-3 weeks in outpatient office     RADIOGRAPHS: AP, lateral, oblique and stress radiographs of the right ankle were obtained intraoperatively. These showed interval reduction and fixation of the fractures. Manual stress radiographs were taken and the joints were noted to be stable following fixation. All hardware is appropriately positioned and of the appropriate lengths. No other acute injuries are noted.   AP, lateral, oblique and stress radiographs of the right foot were obtained intraoperatively. These showed interval reduction and fixation of the fractures. Manual stress radiographs were taken and the joints were noted to be stable following fixation. All hardware is appropriately positioned and of the appropriate lengths. No other acute injuries are noted.     Lillia Mountain [facility]

## 2024-05-09 NOTE — H&P (Signed)

## 2024-05-09 NOTE — Progress Notes (Addendum)
 PROGRESS NOTE    Sarah Chung  FMW:[id number] DOB: 08-22-1960 DOA: 05/08/2024 PCP: Sarah Lavern CROME, MD     Brief Narrative:  Sarah Chung is a 63 y.o. 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   She was admitted July 2025 and underwent lumbar laminectomy L4-5, L5-S1 by Sarah Chung on 02/25/2024.  Perioperatively her Xarelto  was held and an IVC filter was placed.  Postoperatively she recovered well.  IVC filter was removed on 03/20/2024.   She has been working with PT at her home.  She says while walking up the stairs last week her ankle twisted and gave way resulting in significant pain.  She was seen in [facility], ED on 05/02/2024.  Ankle x-ray was negative for acute fracture. She had persistent pain and swelling and was seen in the [facility] clinic for follow-up.  X-rays did show a right ankle fracture.  She had follow-up CT of the right ankle yesterday 9/29.  This showed acute bimalleolar fracture of the right ankle, additional fractures of the 1st and 2nd tarsometatarsal joints indicated for Lisfranc ligament injury.  Tiny cortical avulsion fragments along the lateral talus and small bony fragment along the lateral calcaneal body also noted. She saw Sarah Chung in the [facility] clinic today.  He recommended that she come to the [facility] ED to be admitted under the hospitalist service with plan for surgery while in [facility].  New events last 24 hours / Subjective: Patient had significant ankle pain this morning.  She did receive IV fentanyl  12.5mcg with improvement in pain.  Awaiting surgical intervention today.  Assessment & Plan:   Principal Problem:   Bimalleolar ankle fracture, right, closed, initial encounter Active Problems:   Seizure disorder (HCC)   Chronic  anticoagulation   Essential hypertension   Acute bimalleolar right ankle fracture after fall - Orthopedic surgery consulted - OR today - Last Xarelto  dose 9/29.  Resume Xarelto  as soon as cleared from orthopedic surgery standpoint  History of PE/DVT - Last Xarelto  dose 9/29  AKI - Baseline creatinine 1.  Start IV fluid.  Hold ARB-hydrochlorothiazide   Acute urinary retention - Bladder scan showing >946ml this morning and then subsequently . Successfully straight cath'd for clear yellow urine  - Continue to monitor with bladder scan as needed.  If requiring >3 straight cath within 24 hours, would place Foley  Hypertension - Hold ARB-hydrochlorothiazide   Seizure disorder - Phenobarbital    DVT prophylaxis:  SCDs Start: 05/08/24 1926  Code Status: Full code Family Communication: None at bedside Disposition Plan: Pending surgical intervention Status is: Observation The patient will require care spanning > 2 midnights and should be moved to inpatient because: OR today    Antimicrobials:  Anti-infectives (From admission, onward)    Start     Dose/Rate Route Frequency Ordered Stop   05/09/24 0600  levofloxacin (LEVAQUIN) IVPB 500 mg        500 mg 100 mL/hr over 60 Minutes Intravenous To ShortStay Surgical 05/08/24 2201 05/10/24 0600   05/09/24 0600  vancomycin (VANCOCIN) IVPB 1000 mg/200 mL premix        1,000 mg 200 mL/hr over 60 Minutes Intravenous To ShortStay Surgical 05/08/24 2201 05/10/24 0600        Objective: Vitals:   05/08/24 1943 05/08/24 2026 05/09/24 0456 05/09/24 9263  BP: 118/60 120/63 115/75 123/69  Pulse:  85 72 67  Resp:  18    Temp:  98.5 F (36.9 C) 97.7 F (36.5 C) 97.7 F (36.5 C)  TempSrc:  Oral    SpO2:  94% 96% 96%    Intake/Output Summary (Last 24 hours) at 05/09/2024 1246 Last data filed at 05/09/2024 0800 Gross per 24 hour  Intake --  Output 0 ml  Net 0 ml   There were no vitals filed for this visit.  Examination:   General exam: Appears calm and comfortable  Respiratory system: Respiratory effort normal. No respiratory distress. No conversational dyspnea.  Central nervous system: Alert and oriented. No focal neurological deficits. Speech clear.  Psychiatry: Judgement and insight appear normal. Mood & affect appropriate.   Data Reviewed: I have personally reviewed following labs and imaging studies  CBC: Recent Labs  Lab 05/08/24 1310 05/09/24 0144  WBC 7.4 6.2  NEUTROABS 5.2  --   HGB 11.1* 9.0*  HCT 33.4* 28.0*  MCV 93.3 95.6  PLT 305 274   Basic Metabolic Panel: Recent Labs  Lab 05/08/24 1310 05/09/24 0144  NA 140 139  K 4.5 4.2  CL 104 107  CO2 23 21*  GLUCOSE 108* 127*  BUN 49* 46*  CREATININE 1.37* 1.64*  CALCIUM  9.3 8.5*   GFR: Estimated Creatinine Clearance: 38.1 mL/min (A) (by C-G formula based on SCr of 1.64 mg/dL (H)). Liver Function Tests: No results for input(s): AST, ALT, ALKPHOS, BILITOT, PROT, ALBUMIN in the last 168 hours. No results for input(s): LIPASE, AMYLASE in the last 168 hours. No results for input(s): AMMONIA in the last 168 hours. Coagulation Profile: Recent Labs  Lab 05/08/24 1310  INR 1.1   Cardiac Enzymes: No results for input(s): CKTOTAL, CKMB, CKMBINDEX, TROPONINI in the last 168 hours. BNP (last 3 results) No results for input(s): PROBNP in the last 8760 hours. HbA1C: No results for input(s): HGBA1C in the last 72 hours. CBG: No results for input(s): GLUCAP in the last 168 hours. Lipid Profile: No results for input(s): CHOL, HDL, LDLCALC, TRIG, CHOLHDL, LDLDIRECT in the last 72 hours. Thyroid Function Tests: No results for input(s): TSH, T4TOTAL, FREET4, T3FREE, THYROIDAB in the last 72 hours. Anemia Panel: No results for input(s): VITAMINB12, FOLATE, FERRITIN, TIBC, IRON, RETICCTPCT in the last 72 hours. Sepsis Labs: No results for input(s): PROCALCITON,  LATICACIDVEN in the last 168 hours.  Recent Results (from the past 240 hours)  Surgical pcr screen     Status: None   Collection Time: 05/09/24  9:32 AM   Specimen: Nasal Mucosa; Nasal Swab  Result Value Ref Range Status   MRSA, PCR NEGATIVE NEGATIVE Final   Staphylococcus aureus NEGATIVE NEGATIVE Final    Comment: (NOTE) The Xpert SA Assay (FDA approved for NASAL specimens in patients 44 years of age and older), is one component of a comprehensive surveillance program. It is not intended to diagnose infection nor to guide or monitor treatment. Performed at [facility], [address], [place], [place] [address]       Radiology Studies: VAS US  LOWER EXTREMITY VENOUS (DVT) (7a-7p) Result Date: 05/09/2024  Lower Venous DVT Study Patient Name:  Sarah Chung  Date of Exam:   05/08/2024 Medical Rec #: [id number]    Accession #:    [id number] Date of Birth: 23-Sep-1960   Patient Gender: F Patient Age:   71 years Exam Location:  [facility] Procedure:      VAS US   LOWER EXTREMITY VENOUS (DVT) Referring Phys: BOWIE TRAN --------------------------------------------------------------------------------  Indications: Pain, and Swelling. Other Indications: Fall at PT last wednesday that resulted in foot and ankle                    fracture. Check for DVT prior to surgery. Risk Factors: Hx of DVT/PE. Anticoagulation: Xarelto . Limitations: Poor ultrasound/tissue interface and pain intolerance. 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 Bilateral testing is considered an integral part of a complete examination. Limited examinations for reoccurring indications may be performed as noted. The reflux portion of the exam is  performed with the patient in reverse Trendelenburg.  +---------+---------------+---------+-----------+----------+--------------+ RIGHT    CompressibilityPhasicitySpontaneityPropertiesThrombus Aging +---------+---------------+---------+-----------+----------+--------------+ CFV      Full           Yes      Yes                                 +---------+---------------+---------+-----------+----------+--------------+ SFJ      Full                                                        +---------+---------------+---------+-----------+----------+--------------+ FV Prox  Full           Yes      Yes                                 +---------+---------------+---------+-----------+----------+--------------+ FV Mid   Full           Yes      Yes                                 +---------+---------------+---------+-----------+----------+--------------+ FV DistalFull           Yes      Yes                                 +---------+---------------+---------+-----------+----------+--------------+ PFV      Full                                                        +---------+---------------+---------+-----------+----------+--------------+ POP      Full           Yes      Yes                                 +---------+---------------+---------+-----------+----------+--------------+ PTV      Full                                                        +---------+---------------+---------+-----------+----------+--------------+  PERO     Full                                                        +---------+---------------+---------+-----------+----------+--------------+   +----+---------------+---------+-----------+----------+--------------+ LEFTCompressibilityPhasicitySpontaneityPropertiesThrombus Aging +----+---------------+---------+-----------+----------+--------------+ CFV Full           Yes      Yes                                  +----+---------------+---------+-----------+----------+--------------+     Summary: RIGHT: - There is no evidence of deep vein thrombosis in the lower extremity.  - A cystic structure is found in the popliteal fossa (4.32 x 0.58 x 3.33 cm).  LEFT: - No evidence of common femoral vein obstruction.   *See table(s) above for measurements and observations. Electronically signed by Lonni Gaskins MD on 05/09/2024 at 7:26:15 AM.    Final    DG Chest 2 View Result Date: 05/08/2024 CLINICAL DATA:  Preop. Fall last week with right foot ankle fracture. EXAM: CHEST - 2 VIEW COMPARISON:  03/04/2022 FINDINGS: Lungs are somewhat hypoinflated with minimal elevation of the right hemidiaphragm. There is bibasilar linear density compatible with atelectasis. No effusion or pneumothorax. Cardiomediastinal silhouette and remainder of the exam is unchanged. IMPRESSION: Hypoinflation with bibasilar linear atelectasis. Electronically Signed   By: Toribio Agreste M.D.   On: 05/08/2024 14:20      Scheduled Meds:  PHENobarbital   129.6 mg Oral QHS   Continuous Infusions:  lactated ringers  75 mL/hr at 05/09/24 1021   levofloxacin (LEVAQUIN) IV     vancomycin       LOS: 0 days   Time spent: 35 minutes   Delon Hoe, DO [facility] 05/09/2024, 12:46 PM   Available via Epic secure chat 7am-7pm After these hours, please refer to coverage provider listed on [email or web address]

## 2024-05-09 NOTE — Anesthesia Procedure Notes (Signed)
 Anesthesia Regional Block: Popliteal block   Pre-Anesthetic Checklist: , timeout performed,  Correct Patient, Correct Site, Correct Laterality,  Correct Procedure, Correct Position, site marked,  Risks and benefits discussed,  Surgical consent,  Pre-op evaluation,  At surgeon's request and post-op pain management  Laterality: Right  Prep: chloraprep       Needles:  Injection technique: Single-shot  Needle Type: Echogenic Needle     Needle Length: 9cm  Needle Gauge: 21     Additional Needles:   Procedures:,,,, ultrasound used (permanent image in chart),,    Narrative:  Start time: 05/09/2024 4:30 PM End time: 05/09/2024 4:36 PM Injection made incrementally with aspirations every 5 mL.  Performed by: Personally  Anesthesiologist: Epifanio Charleston, MD

## 2024-05-09 NOTE — Progress Notes (Signed)
 Subjective: * Day of Surgery * Procedure(s) (LRB): OPEN REDUCTION INTERNAL FIXATION (ORIF) ANKLE FRACTURE (Right) OPEN REDUCTION INTERNAL FIXATION (ORIF) FOOT LISFRANC FRACTURE (Right)  Patient reports pain as appropriately controlled. Denies any new numbness/tingling.   Objective:   VITALS:  Temp:  [97.7 F (36.5 C)-98.4 F (36.9 C)] 98.4 F (36.9 C) (10/01 2020) Pulse Rate:  [62-96] 75 (10/01 2020) Resp:  [12-20] 20 (10/01 2020) BP: (97-136)/(53-92) 112/55 (10/01 2020) SpO2:  [90 %-97 %] 96 % (10/01 2020) Weight:  [92.1 kg] 92.1 kg (10/01 1441)  Gen: AAOx3, NAD  Right lower extremity: Well padded short leg splint in place Wiggles toes SILT over toes CR<2s    LABS Recent Labs    05/08/24 1310 05/09/24 0144  HGB 11.1* 9.0*  WBC 7.4 6.2  PLT 305 274   Recent Labs    05/08/24 1310 05/09/24 0144  NA 140 139  K 4.5 4.2  CL 104 107  CO2 23 21*  BUN 49* 46*  CREATININE 1.37* 1.64*  GLUCOSE 108* 127*   Recent Labs    05/08/24 1310  INR 1.1     Assessment/Plan: * Day of Surgery * Procedure(s) (LRB): OPEN REDUCTION INTERNAL FIXATION (ORIF) ANKLE FRACTURE (Right) OPEN REDUCTION INTERNAL FIXATION (ORIF) FOOT LISFRANC FRACTURE (Right)  -history and exam reviewed with patient -plan for ORIF right foot and ankle today as discussed -cleared and optimized preop by hospitalist team -postop anticoagulation per hospitalist -anticipate PT/OT and SNF placement postop -pt NPO and held VTE ppx from [place]  [facility] 05/09/2024, 9:51 PM

## 2024-05-09 NOTE — Anesthesia Procedure Notes (Signed)
 Procedure Name: LMA Insertion Date/Time: 05/09/2024 5:13 PM  Performed by: Kaylem Gidney, CRNAPre-anesthesia Checklist: Patient identified, Emergency Drugs available, Suction available and Patient being monitored Patient Re-evaluated:Patient Re-evaluated prior to induction Oxygen Delivery Method: Circle System Utilized Preoxygenation: Pre-oxygenation with 100% oxygen Induction Type: IV induction Ventilation: Mask ventilation without difficulty LMA: LMA inserted LMA Size: 4.0 Number of attempts: 1 Airway Equipment and Method: Bite block Placement Confirmation: positive ETCO2 Tube secured with: Tape Dental Injury: Teeth and Oropharynx as per pre-operative assessment

## 2024-05-09 NOTE — Transfer of Care (Signed)
 Immediate Anesthesia Transfer of Care Note  Patient: Sarah Chung  Procedure(s) Performed: OPEN REDUCTION INTERNAL FIXATION (ORIF) ANKLE FRACTURE (Right: Ankle) OPEN REDUCTION INTERNAL FIXATION (ORIF) FOOT LISFRANC FRACTURE (Right: Foot)  Patient Location: PACU  Anesthesia Type:General and Regional  Level of Consciousness: awake, alert , and oriented  Airway & Oxygen Therapy: Patient Spontanous Breathing and Patient connected to nasal cannula oxygen  Post-op Assessment: Report given to RN and Post -op Vital signs reviewed and stable  Post vital signs: Reviewed and stable  Last Vitals:  Vitals Value Taken Time  BP 123/82 05/09/24 18:30  Temp 36.7 C 05/09/24 18:28  Pulse 98 05/09/24 18:31  Resp 17 05/09/24 18:31  SpO2 97 % 05/09/24 18:31  Vitals shown include unfiled device data.  Last Pain:  Vitals:   05/09/24 1655  TempSrc:   PainSc: 0-No pain         Complications: No notable events documented.

## 2024-05-09 NOTE — Progress Notes (Signed)
 OR tech Adam to room to pick patient up for OR case. Patient necklace and pendant removed, given to daughter, Harlene, at bedside. Patient cell phone left in room with other personal belongings. Patient off to OR with eye glasses as only possession. Daughter accompanied to pre-op holding. Handoff report given via telephone to Anette Bloch, OR RN.

## 2024-05-09 NOTE — Discharge Instructions (Signed)
 Lillia Mountain, MD [facility]  Please read the following information regarding your care after surgery.  Medications  You only need a prescription for the narcotic pain medicine (ex. oxycodone , Percocet, Norco).  All of the other medicines listed below are available over the counter. ? acetaminophen  (Tylenol ) 500 mg every 4-6 hours as you need for minor to moderate pain  Weight Bearing ? Do NOT bear any weight on the operated leg or foot. This means do NOT touch your surgical leg to the ground!  Cast / Splint / Dressing ? If you have a splint, do NOT remove this. Keep your splint, cast or dressing clean and dry.  Don't put anything (coat hanger, pencil, etc) down inside of it.  If it gets wet, call the office immediately to schedule an appointment for a cast change.  Swelling IMPORTANT: It is normal for you to have swelling where you had surgery. To reduce swelling and pain, keep at least 3 pillows under your leg so that your toes are above your nose and your heel is above the level of your hip.  It may be necessary to keep your foot or leg elevated for several weeks.  This is critical to helping your incisions heal and your pain to feel better.  Follow Up Call my office at [phone] when you are discharged from the hospital or surgery center to schedule an appointment to be seen 7-10 days after surgery.  Call my office at [phone] if you develop a fever >101.5 F, nausea, vomiting, bleeding from the surgical site or severe pain.

## 2024-05-09 NOTE — Anesthesia Preprocedure Evaluation (Addendum)
 Anesthesia Evaluation  Patient identified by MRN, date of birth, ID band Patient awake    Reviewed: Allergy & Precautions, NPO status , Patient's Chart, lab work & pertinent test results  Airway Mallampati: II  TM Distance: >3 FB Neck ROM: Full    Dental no notable dental hx.    Pulmonary PE   Pulmonary exam normal        Cardiovascular hypertension, Pt. on medications + CAD and + DVT  Normal cardiovascular exam     Neuro/Psych Seizures -, Well Controlled,  PSYCHIATRIC DISORDERS Anxiety Depression     Neuromuscular disease    GI/Hepatic negative GI ROS, Neg liver ROS,,,  Endo/Other  negative endocrine ROS    Renal/GU Renal InsufficiencyRenal disease     Musculoskeletal  (+) Arthritis ,    Abdominal  (+) + obese  Peds  Hematology  (+) Blood dyscrasia (Xarelto ), anemia   Anesthesia Other Findings Right ankle fracture and Right midfoot fracture  Reproductive/Obstetrics                              Anesthesia Physical Anesthesia Plan  ASA: 3  Anesthesia Plan: Regional and General   Post-op Pain Management: Regional block*   Induction: Intravenous  PONV Risk Score and Plan: 3 and Ondansetron , Dexamethasone , Midazolam  and Treatment may vary due to age or medical condition  Airway Management Planned: LMA  Additional Equipment:   Intra-op Plan:   Post-operative Plan: Extubation in OR  Informed Consent: I have reviewed the patients History and Physical, chart, labs and discussed the procedure including the risks, benefits and alternatives for the proposed anesthesia with the patient or authorized representative who has indicated his/her understanding and acceptance.     Dental advisory given  Plan Discussed with: CRNA  Anesthesia Plan Comments:          Anesthesia Quick Evaluation

## 2024-05-09 NOTE — Anesthesia Postprocedure Evaluation (Signed)
 Anesthesia Post Note  Patient: Sarah Chung  Procedure(s) Performed: OPEN REDUCTION INTERNAL FIXATION (ORIF) ANKLE FRACTURE (Right: Ankle) OPEN REDUCTION INTERNAL FIXATION (ORIF) FOOT LISFRANC FRACTURE (Right: Foot)     Patient location during evaluation: PACU Anesthesia Type: Regional Level of consciousness: awake and alert Pain management: pain level controlled Vital Signs Assessment: post-procedure vital signs reviewed and stable Respiratory status: spontaneous breathing, nonlabored ventilation, respiratory function stable and patient connected to nasal cannula oxygen Cardiovascular status: blood pressure returned to baseline and stable Postop Assessment: no apparent nausea or vomiting Anesthetic complications: no   There were no known notable events for this encounter.  Last Vitals:  Vitals:   05/09/24 1930 05/09/24 2020  BP: 136/73 (!) 112/55  Pulse: 75 75  Resp: 15 20  Temp: 36.7 C 36.9 C  SpO2: 96% 96%    Last Pain:  Vitals:   05/09/24 1900  TempSrc:   PainSc: 0-No pain                 [name]

## 2024-05-10 ENCOUNTER — Encounter (HOSPITAL_COMMUNITY): Payer: Self-pay | Admitting: Orthopaedic Surgery

## 2024-05-10 LAB — CBC
HCT: 30.1 % — ABNORMAL LOW (ref 36.0–46.0)
Hemoglobin: 9.7 g/dL — ABNORMAL LOW (ref 12.0–15.0)
MCH: 30.9 pg (ref 26.0–34.0)
MCHC: 32.2 g/dL (ref 30.0–36.0)
MCV: 95.9 fL (ref 80.0–100.0)
Platelets: 243 K/uL (ref 150–400)
RBC: 3.14 MIL/uL — ABNORMAL LOW (ref 3.87–5.11)
RDW: 13.2 % (ref 11.5–15.5)
WBC: 7.6 K/uL (ref 4.0–10.5)
nRBC: 0 % (ref 0.0–0.2)

## 2024-05-10 LAB — BASIC METABOLIC PANEL WITH GFR
Anion gap: 9 (ref 5–15)
BUN: 32 mg/dL — ABNORMAL HIGH (ref 8–23)
CO2: 24 mmol/L (ref 22–32)
Calcium: 8.7 mg/dL — ABNORMAL LOW (ref 8.9–10.3)
Chloride: 104 mmol/L (ref 98–111)
Creatinine, Ser: 1.28 mg/dL — ABNORMAL HIGH (ref 0.44–1.00)
GFR, Estimated: 47 mL/min — ABNORMAL LOW (ref >60)
Glucose, Bld: 91 mg/dL (ref 70–99)
Potassium: 5 mmol/L (ref 3.5–5.1)
Sodium: 137 mmol/L (ref 135–145)

## 2024-05-10 MED ORDER — FENTANYL CITRATE PF 50 MCG/ML IJ SOSY
25.0000 ug | PREFILLED_SYRINGE | INTRAMUSCULAR | Status: DC | PRN
  Administered 2024-05-10 – 2024-05-15 (×11): 25 ug via INTRAVENOUS
  Filled 2024-05-10 (×11): qty 1

## 2024-05-10 MED ORDER — RIVAROXABAN 10 MG PO TABS
20.0000 mg | ORAL_TABLET | Freq: Every day | ORAL | Status: DC
  Administered 2024-05-10 – 2024-05-14 (×5): 20 mg via ORAL
  Filled 2024-05-10 (×5): qty 2

## 2024-05-10 NOTE — Evaluation (Signed)
 Occupational Therapy Evaluation Patient Details Name: Sarah Chung MRN: [id number] DOB: 18-Aug-1960 Today's Date: 05/10/2024   History of Present Illness   Pt is a 63 yo female admitted for ORIF of R ankle after R bimalleolar fx of R ankle and additional fx of 1st/2nd tarsometataral jts with lisfrac ligament injury. PMH: L4-L5/L5-S1 lumbar laminectomy in 02/2024. 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     Clinical Impressions Pt admitted with the above diagnosis and has the deficits outlined below Pt would benefit from cont OT to increase independence with basic adls and functional transfers so she can eventually d/c back to her home. Pt lives alone and was just becoming independent following her back surgery when this ankle fx happened.  Pt is now NWB RLE and very weak in LLE and cannot pivot on her own at this time. Pt will not be safe living alone at this time even if pt stays downstairs only because she can no longer ambulate or pivot transfer at this time.  Rec less than 3 hours of therapy a day prior to going home unless she has someone that could stay with her after becoming more independent for awhile  If this is available, pt would benefit from greater than 3 hours a day to become independent more quickly.  Will continue to see with focus on adl transfers and LE adls.     If plan is discharge home, recommend the following:   A lot of help with walking and/or transfers;A lot of help with bathing/dressing/bathroom;Assistance with cooking/housework;Assist for transportation;Help with stairs or ramp for entrance     Functional Status Assessment   Patient has had a recent decline in their functional status and demonstrates the ability to make significant improvements in function in a reasonable and predictable amount of time.     Equipment Recommendations    (tbd)     Recommendations for Other Services          Precautions/Restrictions   Precautions Precautions: Fall Recall of Precautions/Restrictions: Intact Required Braces or Orthoses: Splint/Cast Splint/Cast: R lower leg cast and splint for L ankle Splint/Cast - Date Prophylactic Dressing Applied (if applicable): 05/09/24 Restrictions Weight Bearing Restrictions Per Provider Order: Yes RLE Weight Bearing Per Provider Order: Non weight bearing     Mobility Bed Mobility Overal bed mobility: Needs Assistance Bed Mobility: Supine to Sit, Sit to Supine     Supine to sit: Min assist Sit to supine: Min assist   General bed mobility comments: assist to get to EOB and to get BLEs in bed    Transfers Overall transfer level: Needs assistance Equipment used: Rolling walker (2 wheels) Transfers: Sit to/from Stand Sit to Stand: Min assist           General transfer comment: Pt having difficulty pivoting due to BLE weakness. May benefit from stedy. Transfer via Lift Equipment: Stedy (would benefit from stedy)    Balance Overall balance assessment: Needs assistance Sitting-balance support: Feet supported, Bilateral upper extremity supported Sitting balance-Leahy Scale: Good     Standing balance support: Bilateral upper extremity supported, During functional activity Standing balance-Leahy Scale: Poor Standing balance comment: reliant on walker                           ADL either performed or assessed with clinical judgement   ADL Overall ADL's : Needs assistance/impaired Eating/Feeding: Independent;Sitting   Grooming: Wash/dry hands;Wash/dry  face;Oral care;Applying deodorant;Brushing hair;Set up;Sitting   Upper Body Bathing: Set up;Sitting   Lower Body Bathing: Moderate assistance;Sit to/from stand;Cueing for compensatory techniques   Upper Body Dressing : Set up;Sitting   Lower Body Dressing: Moderate assistance;Sit to/from stand;Cueing for compensatory techniques   Toilet Transfer: Moderate assistance;+2  for physical assistance;Stand-pivot;BSC/3in1 Toilet Transfer Details (indicate cue type and reason): Pt having a great amount of trouble pivoting on her LLE since it is weak PTA and now is NWB on RLE.  Pt would benefit from drop arm BSC so she can scoot onto [facility] without having to standing. Nursing made aware of the need. Toileting- Clothing Manipulation and Hygiene: Maximal assistance;Sit to/from stand;Cueing for compensatory techniques       Functional mobility during ADLs: Maximal assistance;Rolling walker (2 wheels) General ADL Comments: Pt is very limited due to new NWB status of RLE and very weak LLE making mobiity limited. Pt did well standing but mobilizing is more difficult.     Vision Baseline Vision/History: 0 No visual deficits Ability to See in Adequate Light: 0 Adequate Patient Visual Report: No change from baseline Vision Assessment?: No apparent visual deficits     Perception Perception: Within Functional Limits       Praxis Praxis: WFL       Pertinent Vitals/Pain Pain Assessment Pain Assessment: Faces Faces Pain Scale: Hurts little more Pain Location: R ankle Pain Descriptors / Indicators: Aching, Grimacing, Operative site guarding Pain Intervention(s): Monitored during session, Repositioned     Extremity/Trunk Assessment Upper Extremity Assessment Upper Extremity Assessment: Overall WFL for tasks assessed   Lower Extremity Assessment Lower Extremity Assessment: Defer to PT evaluation   Cervical / Trunk Assessment Cervical / Trunk Assessment: Back Surgery   Communication Communication Communication: No apparent difficulties   Cognition Arousal: Alert Behavior During Therapy: WFL for tasks assessed/performed Cognition: No apparent impairments             OT - Cognition Comments: intact                 Following commands: Intact       Cueing  General Comments   Cueing Techniques: Verbal cues  Pt most limited by NWB RLE and weak LLE  so pivoting is not possible. Pt may benefit from lateral scoot transfers or STEDY to [facility]   Exercises     Shoulder Instructions      Home Living Family/patient expects to be discharged to:: Private residence Living Arrangements: Children;Other (Comment) (only available at night) Available Help at Discharge: Family;Friend(s);Available PRN/intermittently Type of Home: House Home Access: Stairs to enter [employer] of Steps: 5 Entrance Stairs-Rails: Right;Left Home Layout: Two level;Bed/bath upstairs;1/2 bath on main level Alternate Level Stairs-Number of Steps: 15   Bathroom Shower/Tub: Walk-in shower;Tub/shower unit;Other (comment)   Bathroom Toilet: Standard     Home Equipment: Educational psychologist (4 wheels);Cane - single point;Other (comment);Toilet riser          Prior Functioning/Environment Prior Level of Function : Independent/Modified Independent;Driving             Mobility Comments: using cane since back surgery and just started going upstairs ADLs Comments: Ind to Mod I with ADLs and IADLs; drives. Just started using upstairs shower more regularly. Since surgery family assisted her up stairs once a week to shower    OT Problem List: Decreased strength;Decreased range of motion;Decreased activity tolerance;Impaired balance (sitting and/or standing);Obesity;Pain   OT Treatment/Interventions: Self-care/ADL training;Therapeutic activities;DME and/or AE instruction;Balance training  OT Goals(Current goals can be found in the care plan section)   Acute Rehab OT Goals Patient Stated Goal: to just get back to being independent OT Goal Formulation: With patient Time For Goal Achievement: 05/24/24 Potential to Achieve Goals: Good ADL Goals Pt Will Perform Lower Body Bathing: with supervision;sit to/from stand Pt Will Perform Lower Body Dressing: with supervision;with adaptive equipment;sit to/from stand Pt Will Transfer to Toilet: with  supervision;stand pivot transfer;bedside commode Pt Will Perform Toileting - Clothing Manipulation and hygiene: with supervision;sit to/from stand Pt Will Perform Tub/Shower Transfer: Tub transfer;tub bench;with supervision   OT Frequency:  Min 2X/week    Co-evaluation              AM-PAC OT 6 Clicks Daily Activity     Outcome Measure Help from another person eating meals?: None Help from another person taking care of personal grooming?: A Little Help from another person toileting, which includes using toliet, bedpan, or urinal?: A Lot Help from another person bathing (including washing, rinsing, drying)?: A Lot Help from another person to put on and taking off regular upper body clothing?: A Little Help from another person to put on and taking off regular lower body clothing?: A Lot 6 Click Score: 16   End of Session Equipment Utilized During Treatment: Rolling walker (2 wheels) Nurse Communication: Mobility status;Other (comment) (pt on bedpan)  Activity Tolerance: Patient limited by fatigue Patient left: in bed;with call bell/phone within reach;Other (comment) (on bedpain. nursing aware)  OT Visit Diagnosis: Unsteadiness on feet (R26.81);Other abnormalities of gait and mobility (R26.89);Repeated falls (R29.6);Muscle weakness (generalized) (M62.81);Pain Pain - Right/Left: Right Pain - part of body: Ankle and joints of foot                Time: 8684-8660 OT Time Calculation (min): 24 min Charges:  OT General Charges $OT Visit: 1 Visit OT Evaluation $OT Eval Moderate Complexity: 1 Mod OT Treatments $Self Care/Home Management : 8-22 mins  Joshua Silvano Dragon 05/10/2024, 2:05 PM

## 2024-05-10 NOTE — Progress Notes (Signed)
 ? ?  Inpatient Rehab Admissions Coordinator : ? ?Per therapy recommendations patient was screened for CIR candidacy by Ottie Glazier RN MSN. Patient does not appear to demonstrate the medical neccesity for a Hospital Rehabilitation /CIR admit. I will not place a Rehab Consult. Recommend other Rehab Venues to be pursued. Please contact me with any questions. ? ?Ottie Glazier RN MSN ?Admissions Coordinator ?[phone]  ?

## 2024-05-10 NOTE — TOC Initial Note (Signed)
 Transition of Care [facility]) - Initial/Assessment Note    Patient Details  Name: Sarah Chung MRN: [id number] Date of Birth: 07-17-1961  [facility]) CM/SW Contact:    Sherline Clack, LCSWA Phone Number: 05/10/2024, 2:58 PM  Clinical Narrative:                  CSW spoke with patient at bedside regarding anticipated discharge plan. Patient does not have insurance at this time but reports she is getting help with her Medicare application so she can start her benefits the first of November. Patient verbalized being interested in self-paying for SNF until Medicare is established. CSW shared that room and board for a SNF can be up to $10,000 and that therapy and medication are separate expenses. Patient asked for more information. CSW will reach out to Madelin Jacobus to see if she or Donnamarie can come talk to patient about her options if she decides to follow the self-pay route.   Patient mentioned CIR and asked if she was eligible. CSW messaged PT/OT and patient will be evaluated for CIR. Patient states she will have the support of her two daughters, one who lives in [place] and another who lives in [place], once she able to return home. CSW will continue to follow and update discharge plan.   Expected Discharge Plan: Skilled Nursing Facility Barriers to Discharge: Continued Medical Work up, Inadequate or no insurance   Patient Goals and CMS Choice            Expected Discharge Plan and Services                                              Prior Living Arrangements/Services                Care giver support system in place?: Yes (comment) (2 daughters, one in [place] and the other one lives in [place])      Activities of Daily Living   ADL Screening (condition at time of admission) Independently performs ADLs?: Yes (appropriate for developmental age) Is the patient deaf or have difficulty hearing?: No Does the patient have  difficulty seeing, even when wearing glasses/contacts?: No Does the patient have difficulty concentrating, remembering, or making decisions?: No  Permission Sought/Granted                  Emotional Assessment Appearance:: Appears stated age Attitude/Demeanor/Rapport: Engaged Affect (typically observed): Appropriate Orientation: : Oriented to Self, Oriented to Place, Oriented to  Time, Oriented to Situation      Admission diagnosis:  AKI (acute kidney injury) [N17.9] Closed bimalleolar fracture of right ankle, initial encounter [D17.158J] Bimalleolar ankle fracture, right, closed, initial encounter [D17.158J] Lisfranc's sprain, right, initial encounter [D06.378J] Anticoagulated on Xarelto  [Z79.01] Ankle fracture, bimalleolar, closed, right, initial encounter [D17.158J] Patient Active Problem List   Diagnosis Date Noted   Ankle fracture, bimalleolar, closed, right, initial encounter 05/09/2024   Bimalleolar ankle fracture, right, closed, initial encounter 05/08/2024   S/P lumbar laminectomy 02/24/2024   Statin myopathy 11/11/2023   Spinal osteoarthritis 10/20/2023   Vitamin D  deficiency 10/20/2023   Leg cramps 10/20/2023   Falls, subsequent encounter 08/01/2023   Colon polyps 06/22/2023   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   Gait difficulty 06/06/2023   Osteopenia 12/17/2022   Essential hypertension 09/20/2022   Depression, major, single episode, moderate (HCC) 09/01/2022  Chronic anticoagulation 06/22/2021   Seizure disorder (HCC) 08/27/2020   Mixed hyperlipidemia 06/20/2020   Obesity 06/20/2020   Nonobstructive atherosclerosis of coronary artery 04/20/2019   Pulmonary embolism (HCC) x2 02/09/2019   PCP:  Jodie Lavern CROME, MD Pharmacy:   [facility] [phone] - [place], [place] - [address] AT [facility] [address] [place] [address] Phone: [phone] Fax: [phone]     Social Drivers of Health (SDOH) Social  History: SDOH Screenings   Food Insecurity: No Food Insecurity (05/08/2024)  Housing: Low Risk  (05/08/2024)  Transportation Needs: No Transportation Needs (05/08/2024)  Utilities: Patient Declined (05/08/2024)  Depression (PHQ2-9): Low Risk  (04/05/2024)  Financial Resource Strain: Low Risk  (10/19/2023)  Physical Activity: Unknown (10/19/2023)  Social Connections: Unknown (10/19/2023)  Stress: Stress Concern Present (10/19/2023)  Tobacco Use: Low Risk  (05/09/2024)   SDOH Interventions:     Readmission Risk Interventions     No data to display

## 2024-05-10 NOTE — Progress Notes (Addendum)
 PROGRESS NOTE    Sarah Chung  FMW:[id number] DOB: 1960/12/17 DOA: 05/08/2024 PCP: Jodie Lavern CROME, MD     Brief Narrative:  Sarah Chung is a 64 y.o. 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   She was admitted July 2025 and underwent lumbar laminectomy L4-5, L5-S1 by Dr. Joshua on 02/25/2024.  Perioperatively her Xarelto  was held and an IVC filter was placed.  Postoperatively she recovered well.  IVC filter was removed on 03/20/2024.   She has been working with PT at her home.  She says while walking up the stairs last week her ankle twisted and gave way resulting in significant pain.  She was seen in [facility] ED on 05/02/2024.  Ankle x-ray was negative for acute fracture. She had persistent pain and swelling and was seen in the [facility] clinic for follow-up.  X-rays did show a right ankle fracture.  She had follow-up CT of the right ankle yesterday 9/29.  This showed acute bimalleolar fracture of the right ankle, additional fractures of the 1st and 2nd tarsometatarsal joints indicated for Lisfranc ligament injury.  Tiny cortical avulsion fragments along the lateral talus and small bony fragment along the lateral calcaneal body also noted. She saw Dr. Barton in the [facility] clinic today.  He recommended that she come to the [facility] ED to be admitted under the hospitalist service with plan for surgery while in hospital.  Patient underwent ORIF of right ankle with Dr. Barton 10/1.   New events last 24 hours / Subjective: She has had 3 episodes of watery stools this morning.  No nausea or vomiting.  Denies any abdominal cramping   She states that she lives at home with daughter, however the daughter is away a lot for work.  Patient is largely alone during the day.  She  states that there are 5 steps to get into her house and then another 18 steps to get to the second level.  She does not have a bath/shower available on the main level of her home.  Most recently, she has been staying with a friend who has a ramp to get inside the house and she has been using a wheelchair.  She will most likely require SNF placement  Assessment & Plan:   Principal Problem:   Bimalleolar ankle fracture, right, closed, initial encounter Active Problems:   Seizure disorder (HCC)   Chronic anticoagulation   Essential hypertension   Ankle fracture, bimalleolar, closed, right, initial encounter   Acute bimalleolar right ankle fracture after fall - Orthopedic surgery consulted - S/p ORIF 10/1 - NWB right lower extremity - PT OT - TOC consult  History of PE/DVT - Resume Xarelto  today  AKI - Baseline creatinine 1.  Improved with IV fluid.  Acute urinary retention - Improved  Hypertension - Hold ARB-hydrochlorothiazide   Seizure disorder - Phenobarbital   Diarrhea - Check C. difficile/GI PCR today   DVT prophylaxis:  rivaroxaban  (XARELTO ) tablet 20 mg Start: 05/10/24 1700 SCDs Start: 05/08/24 1926 rivaroxaban  (XARELTO ) tablet 20 mg  Code Status: Full code Family Communication: Friend at bedside Disposition Plan: SNF Status is: Inpatient Remains inpatient appropriate because: Placement     Antimicrobials:  Anti-infectives (From admission, onward)    Start     Dose/Rate Route Frequency Ordered Stop   05/09/24 2200  ceFAZolin  (  ANCEF ) IVPB 1 g/50 mL premix        1 g 100 mL/hr over 30 Minutes Intravenous Every 8 hours 05/09/24 1823 05/10/24 2159   05/09/24 0600  levofloxacin (LEVAQUIN) IVPB 500 mg  Status:  Discontinued        500 mg 100 mL/hr over 60 Minutes Intravenous To ShortStay Surgical 05/08/24 2201 05/09/24 1623   05/09/24 0600  vancomycin (VANCOCIN) IVPB 1000 mg/200 mL premix  Status:  Discontinued        1,000 mg 200 mL/hr over 60 Minutes  Intravenous To ShortStay Surgical 05/08/24 2201 05/09/24 1623        Objective: Vitals:   05/09/24 2020 05/10/24 0037 05/10/24 0511 05/10/24 0803  BP: (!) 112/55 106/68 130/89 (!) 143/72  Pulse: 75 74 84 83  Resp: 20 20 20    Temp: 98.4 F (36.9 C) 97.7 F (36.5 C) 97.9 F (36.6 C)   TempSrc:      SpO2: 96% 95% 98% 97%  Weight:      Height:        Intake/Output Summary (Last 24 hours) at 05/10/2024 1235 Last data filed at 05/10/2024 0540 Gross per 24 hour  Intake 850 ml  Output 1900 ml  Net -1050 ml   Filed Weights   05/09/24 1441  Weight: 92.1 kg    Examination: General exam: Appears calm and comfortable  Gastrointestinal system: Abdomen is nondistended, soft, mildly tender to palpation right abdomen Central nervous system: Alert and oriented. Non focal exam. Speech clear  Extremities: Right lower extremity in splint Psychiatry: Judgement and insight appear stable. Mood & affect appropriate.    Data Reviewed: I have personally reviewed following labs and imaging studies  CBC: Recent Labs  Lab 05/08/24 1310 05/09/24 0144 05/10/24 0504  WBC 7.4 6.2 7.6  NEUTROABS 5.2  --   --   HGB 11.1* 9.0* 9.7*  HCT 33.4* 28.0* 30.1*  MCV 93.3 95.6 95.9  PLT 305 274 243   Basic Metabolic Panel: Recent Labs  Lab 05/08/24 1310 05/09/24 0144 05/10/24 0504  NA 140 139 137  K 4.5 4.2 5.0  CL 104 107 104  CO2 23 21* 24  GLUCOSE 108* 127* 91  BUN 49* 46* 32*  CREATININE 1.37* 1.64* 1.28*  CALCIUM  9.3 8.5* 8.7*   GFR: Estimated Creatinine Clearance: 48.9 mL/min (A) (by C-G formula based on SCr of 1.28 mg/dL (H)). Liver Function Tests: No results for input(s): AST, ALT, ALKPHOS, BILITOT, PROT, ALBUMIN in the last 168 hours. No results for input(s): LIPASE, AMYLASE in the last 168 hours. No results for input(s): AMMONIA in the last 168 hours. Coagulation Profile: Recent Labs  Lab 05/08/24 1310  INR 1.1   Cardiac Enzymes: No results for  input(s): CKTOTAL, CKMB, CKMBINDEX, TROPONINI in the last 168 hours. BNP (last 3 results) No results for input(s): PROBNP in the last 8760 hours. HbA1C: No results for input(s): HGBA1C in the last 72 hours. CBG: No results for input(s): GLUCAP in the last 168 hours. Lipid Profile: No results for input(s): CHOL, HDL, LDLCALC, TRIG, CHOLHDL, LDLDIRECT in the last 72 hours. Thyroid Function Tests: No results for input(s): TSH, T4TOTAL, FREET4, T3FREE, THYROIDAB in the last 72 hours. Anemia Panel: No results for input(s): VITAMINB12, FOLATE, FERRITIN, TIBC, IRON, RETICCTPCT in the last 72 hours. Sepsis Labs: No results for input(s): PROCALCITON, LATICACIDVEN in the last 168 hours.  Recent Results (from the past 240 hours)  Surgical pcr screen     Status: None   Collection  Time: 05/09/24  9:32 AM   Specimen: Nasal Mucosa; Nasal Swab  Result Value Ref Range Status   MRSA, PCR NEGATIVE NEGATIVE Final   Staphylococcus aureus NEGATIVE NEGATIVE Final    Comment: (NOTE) The Xpert SA Assay (FDA approved for NASAL specimens in patients 21 years of age and older), is one component of a comprehensive surveillance program. It is not intended to diagnose infection nor to guide or monitor treatment. Performed at [facility], [address] [place], [place] [address]       Radiology Studies: DG Foot 2 Views Right Result Date: 05/09/2024 EXAM: 2 VIEW(S) XRAY OF THE RIGHT FOOT 05/09/2024 06:00:00 PM COMPARISON: None available. CLINICAL HISTORY: Surgery, elective [id number]. Fl time: 1 minutes 13.7 seconds ; mGy: 0.41; OPEN REDUCTION INTERNAL FIXATION (ORIF) FOOT LISFRANC FRACTURE ; Dr. Barton ; RSTO : CMP; Fl time: 1 minutes 13.7 seconds ; mGy: 0.41; OPEN REDUCTION INTERNAL FIXATION (ORIF) FOOT LISFRANC FRACTURE ; Dr. Barton ; RSTO : CMP; Fl time: 1 minutes 13.7 seconds ; mGy: 0.41; Dose report attached to ankle order , Same procedure ,  could not get foot images off of the Right ankle order, I moved to right foot pictures containing the syndesmosis screws to this separate right foot order, but they would not come off the other ; order. It shows ankle hardware as well though. FINDINGS: BONES AND JOINTS: 2 laterally directed screws are present for fixation of a Lisfranc injury. The screws traverse the medial cuneiform and base of the 2nd metatarsal, as well as the medial and intermediate cuneiforms. Hardware appears intact. No joint dislocation. SOFT TISSUES: The soft tissues are unremarkable. IMPRESSION: 1. Intact hardware for fixation of Lisfranc injury, including 2 laterally directed screws traversing the medial cuneiform and base of the 2nd metatarsal as well as the medial and intermediate cuneiforms. Electronically signed by: Donnice Mania MD 05/09/2024 07:19 PM EDT RP Workstation: [id number]   DG Ankle 2 Views Right Result Date: 05/09/2024 EXAM: 2 VIEW(S) XRAY OF THE RIGHT ANKLE 05/09/2024 06:00:00 PM CLINICAL HISTORY: [id number] Surgery, elective [id number]. OPEN REDUCTION INTERNAL FIXATION (ORIF) ANKLE FRACTURE ; Dr. Lillia Barton ; RSTO: CMP; Fl time: 1 minutes 13.7 seconds ; mGy: 0.41; Ankle and foot done as one exam, foot pictures moved to separate right foot order, but the foot pictures did not disappear from the ankle order. All pictures show ankle hardware however. OPEN REDUCTION INTERNAL FIXATION (ORIF) ANKLE FRACTURE COMPARISON: None available. FINDINGS: BONES AND JOINTS: Intraoperative radiographs of the right ankle postsurgical changes of open reduction internal fixation of bimalleolar right ankle fracture. Hardware is normally aligned and appears intact. Normal joint alignment. Additional laterally directed screws for fixation of Lisfranc injury. No acute fracture. No focal osseous lesion. No joint dislocation. SOFT TISSUES: The soft tissues are unremarkable. IMPRESSION: 1. Postsurgical changes of ORIF of bimalleolar right ankle  fracture with normally aligned and intact hardware. 2. Additional laterally directed screws for fixation of Lisfranc injury. Electronically signed by: Donnice Mania MD 05/09/2024 07:17 PM EDT RP Workstation: [id number]   DG C-Arm 1-60 Min-No Report Result Date: 05/09/2024 Fluoroscopy was utilized by the requesting physician.  No radiographic interpretation.   DG C-Arm 1-60 Min-No Report Result Date: 05/09/2024 Fluoroscopy was utilized by the requesting physician.  No radiographic interpretation.   VAS US  LOWER EXTREMITY VENOUS (DVT) (7a-7p) Result Date: 05/09/2024  Lower Venous DVT Study Patient Name:  Sarah Chung  Date of Exam:   05/08/2024 Medical Rec #: [id number]    Accession #:  [id number] Date of Birth: 01-02-1961   Patient Gender: F Patient Age:   54 years Exam Location:  [facility] Procedure:      VAS US  LOWER EXTREMITY VENOUS (DVT) Referring Phys: LONNI TRAN --------------------------------------------------------------------------------  Indications: Pain, and Swelling. Other Indications: Fall at PT last wednesday that resulted in foot and ankle                    fracture. Check for DVT prior to surgery. Risk Factors: Hx of DVT/PE. Anticoagulation: Xarelto . Limitations: Poor ultrasound/tissue interface and pain intolerance. 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 Bilateral testing is considered an integral part of a complete examination. Limited examinations for reoccurring indications may be performed as noted. The reflux portion of the exam is performed with the patient in reverse Trendelenburg.  +---------+---------------+---------+-----------+----------+--------------+ RIGHT     CompressibilityPhasicitySpontaneityPropertiesThrombus Aging +---------+---------------+---------+-----------+----------+--------------+ CFV      Full           Yes      Yes                                 +---------+---------------+---------+-----------+----------+--------------+ SFJ      Full                                                        +---------+---------------+---------+-----------+----------+--------------+ FV Prox  Full           Yes      Yes                                 +---------+---------------+---------+-----------+----------+--------------+ FV Mid   Full           Yes      Yes                                 +---------+---------------+---------+-----------+----------+--------------+ FV DistalFull           Yes      Yes                                 +---------+---------------+---------+-----------+----------+--------------+ PFV      Full                                                        +---------+---------------+---------+-----------+----------+--------------+ POP      Full           Yes      Yes                                 +---------+---------------+---------+-----------+----------+--------------+  PTV      Full                                                        +---------+---------------+---------+-----------+----------+--------------+ PERO     Full                                                        +---------+---------------+---------+-----------+----------+--------------+   +----+---------------+---------+-----------+----------+--------------+ LEFTCompressibilityPhasicitySpontaneityPropertiesThrombus Aging +----+---------------+---------+-----------+----------+--------------+ CFV Full           Yes      Yes                                 +----+---------------+---------+-----------+----------+--------------+     Summary: RIGHT: - There is no evidence of deep vein thrombosis in the lower  extremity.  - A cystic structure is found in the popliteal fossa (4.32 x 0.58 x 3.33 cm).  LEFT: - No evidence of common femoral vein obstruction.   *See table(s) above for measurements and observations. Electronically signed by Lonni Gaskins MD on 05/09/2024 at 7:26:15 AM.    Final    DG Chest 2 View Result Date: 05/08/2024 CLINICAL DATA:  Preop. Fall last week with right foot ankle fracture. EXAM: CHEST - 2 VIEW COMPARISON:  03/04/2022 FINDINGS: Lungs are somewhat hypoinflated with minimal elevation of the right hemidiaphragm. There is bibasilar linear density compatible with atelectasis. No effusion or pneumothorax. Cardiomediastinal silhouette and remainder of the exam is unchanged. IMPRESSION: Hypoinflation with bibasilar linear atelectasis. Electronically Signed   By: Toribio Agreste M.D.   On: 05/08/2024 14:20      Scheduled Meds:  PHENobarbital   129.6 mg Oral QHS   rivaroxaban   20 mg Oral Q supper   Continuous Infusions:   ceFAZolin  (ANCEF ) IV 1 g (05/10/24 0521)     LOS: 1 day   Time spent: 35 minutes   Delon Hoe, DO [facility] 05/10/2024, 12:35 PM   Available via Epic secure chat 7am-7pm After these hours, please refer to coverage provider listed on [email or web address]

## 2024-05-10 NOTE — Evaluation (Addendum)
 Physical Therapy Evaluation Patient Details Name: Sarah Chung MRN: [id number] DOB: April 26, 1961 Today's Date: 05/10/2024  History of Present Illness  Sarah Chung is a 63 yo female admitted for ORIF of R ankle after R bimalleolar fx of R ankle and additional fx of 1st/2nd tarsometataral jts with lisfrac ligament injury. PMH: L4-L5/L5-S1 lumbar laminectomy in 02/2024. Sarah Chung did well after surgery but has longstanding foot drop in LLE, PE, DVT, sz d/o.   Clinical Impression  PTA Sarah Chung was ambulating with SP cane and was working with HHPT. Sarah Chung has a hx of L>R foot drop with recent fall due to tripping up the steps. Sarah Chung was limited by pain in R LE after working with OT with bed level eval performed. Sarah Chung was able to move into long-sitting with use of bilateral handrails. Sarah Chung was also able to scoot towards [facility] performing mini-bridges with MinA to stabilize L foot. Sarah Chung is working on arranging for 24/7 care upon d/c from the hospital. Recommending >3hrs post acute rehab to maximize rehab potential and work towards independence with mobility. Acute Sarah Chung to follow to address functional limitations listed below.         If plan is discharge home, recommend the following: A lot of help with walking and/or transfers;A lot of help with bathing/dressing/bathroom;Assistance with cooking/housework;Help with stairs or ramp for entrance;Assist for transportation;Direct supervision/assist for medications management   Can travel by private vehicle   No    Equipment Recommendations Wheelchair (measurements Sarah Chung);Wheelchair cushion (measurements Sarah Chung);Other (comment) (TBD with progress)     Functional Status Assessment Patient has had a recent decline in their functional status and demonstrates the ability to make significant improvements in function in a reasonable and predictable amount of time.     Precautions / Restrictions Precautions Precautions: Fall Recall of Precautions/Restrictions: Intact Precaution/Restrictions Comments: L ossur  foot-up brace (foot drop) Required Braces or Orthoses: Splint/Cast Splint/Cast: R lower leg cast Splint/Cast - Date Prophylactic Dressing Applied (if applicable): 05/09/24 Restrictions Weight Bearing Restrictions Per Provider Order: Yes RLE Weight Bearing Per Provider Order: Non weight bearing      Mobility  Bed Mobility Overal bed mobility: Needs Assistance Bed Mobility: Supine to Sit    Supine to sit: Min assist    General bed mobility comments: able to bridge to scoot towards HOB with L foot stabilized. Also able to move into long-sitting with use of bed rails    Transfers  General transfer comment: Sarah Chung declined transfer due to pain      Balance Overall balance assessment: Needs assistance, History of Falls          Pertinent Vitals/Pain Pain Assessment Pain Assessment: Faces Faces Pain Scale: Hurts worst Pain Location: R ankle Pain Descriptors / Indicators: Aching, Grimacing, Operative site guarding Pain Intervention(s): Limited activity within patient's tolerance, Monitored during session, Premedicated before session, Repositioned    Home Living Family/patient expects to be discharged to:: Private residence Living Arrangements: Children;Other (Comment) (only available at night) Available Help at Discharge: Family;Friend(s);Available PRN/intermittently Type of Home: House Home Access: Stairs to enter Entrance Stairs-Rails: Right;Left Entrance Stairs-Number of Steps: 5 Alternate Level Stairs-Number of Steps: 15 Home Layout: Two level;Bed/bath upstairs;1/2 bath on main level Home Equipment: Educational psychologist (4 wheels);Cane - single point;Other (comment);Toilet riser      Prior Function Prior Level of Function : Independent/Modified Independent;Driving      Mobility Comments: using cane since back surgery and just started going upstairs ADLs Comments: Ind to Mod I with ADLs and IADLs; drives. Just started using  upstairs shower more regularly. Since surgery  family assisted her up stairs once a week to shower     Extremity/Trunk Assessment   Upper Extremity Assessment Upper Extremity Assessment: Defer to OT evaluation    Lower Extremity Assessment Lower Extremity Assessment: RLE deficits/detail;LLE deficits/detail RLE Deficits / Details: In lower leg cast. Hip flexion 2+/5, Knee ext 2+/5 RLE Sensation: decreased light touch LLE Deficits / Details: Hip flexion 4+/5, Knee ext 4+/5, ankle DF/PF 2-/5 (slight movement with palpable contraction) LLE Sensation: WNL    Cervical / Trunk Assessment Cervical / Trunk Assessment: Back Surgery  Communication   Communication Communication: No apparent difficulties    Cognition Arousal: Alert Behavior During Therapy: WFL for tasks assessed/performed   Sarah Chung - Cognitive impairments: No apparent impairments    Following commands: Intact       Cueing Cueing Techniques: Verbal cues     General Comments General comments (skin integrity, edema, etc.): Sarah Chung most limited by NWB RLE and weak LLE so pivoting is not possible. Sarah Chung may benefit from lateral scoot transfers or STEDY to [facility]    Exercises Other Exercises Other Exercises: L ankle DF/PF, B quad sets, unilateral bridging with L LE, SLR and hip ABD with L LE   Assessment/Plan    Sarah Chung Assessment Patient needs continued Sarah Chung services  Sarah Chung Problem List Decreased strength;Decreased range of motion;Decreased activity tolerance;Decreased balance;Decreased mobility       Sarah Chung Treatment Interventions DME instruction;Gait training;Therapeutic activities;Functional mobility training;Therapeutic exercise;Balance training;Neuromuscular re-education;Patient/family education;Wheelchair mobility training    Sarah Chung Goals (Current goals can be found in the Care Plan section)  Acute Rehab Sarah Chung Goals Patient Stated Goal: to gain independence with mobility Sarah Chung Goal Formulation: With patient Time For Goal Achievement: 05/24/24 Potential to Achieve Goals: Good Additional  Goals Additional Goal #1: Sarah Chung will manage WC parts and be able propel WC >137ft independently    Frequency Min 3X/week        AM-PAC Sarah Chung 6 Clicks Mobility  Outcome Measure Help needed turning from your back to your side while in a flat bed without using bedrails?: A Little Help needed moving from lying on your back to sitting on the side of a flat bed without using bedrails?: A Little Help needed moving to and from a bed to a chair (including a wheelchair)?: Total Help needed standing up from a chair using your arms (e.g., wheelchair or bedside chair)?: Total Help needed to walk in hospital room?: Total Help needed climbing 3-5 steps with a railing? : Total 6 Click Score: 10    End of Session   Activity Tolerance: Patient limited by pain Patient left: in bed;with call bell/phone within reach Nurse Communication: Mobility status Sarah Chung Visit Diagnosis: Unsteadiness on feet (R26.81);Other abnormalities of gait and mobility (R26.89);Muscle weakness (generalized) (M62.81);History of falling (Z91.81)    Time: 8584-8565 Sarah Chung Time Calculation (min) (ACUTE ONLY): 19 min   Charges:   Sarah Chung Evaluation $Sarah Chung Eval Low Complexity: 1 Low   Sarah Chung General Charges $$ ACUTE Sarah Chung VISIT: 1 Visit       Sarah Chung, Sarah Chung, Sarah Chung Secure Chat Preferred  Rehab Office [phone]  Sarah Chung 05/10/2024, 2:57 PM

## 2024-05-10 NOTE — Plan of Care (Signed)
  Problem: Education: Goal: Knowledge of General Education information will improve Description: Including pain rating scale, medication(s)/side effects and non-pharmacologic comfort measures Outcome: Progressing   Problem: Nutrition: Goal: Adequate nutrition will be maintained Outcome: Progressing   Problem: Pain Managment: Goal: General experience of comfort will improve and/or be controlled Outcome: Progressing   Problem: Coping: Goal: Level of anxiety will decrease Outcome: Not Progressing

## 2024-05-11 LAB — CBC
HCT: 28.3 % — ABNORMAL LOW (ref 36.0–46.0)
Hemoglobin: 9.4 g/dL — ABNORMAL LOW (ref 12.0–15.0)
MCH: 31.1 pg (ref 26.0–34.0)
MCHC: 33.2 g/dL (ref 30.0–36.0)
MCV: 93.7 fL (ref 80.0–100.0)
Platelets: 242 K/uL (ref 150–400)
RBC: 3.02 MIL/uL — ABNORMAL LOW (ref 3.87–5.11)
RDW: 13.2 % (ref 11.5–15.5)
WBC: 7.6 K/uL (ref 4.0–10.5)
nRBC: 0 % (ref 0.0–0.2)

## 2024-05-11 LAB — BASIC METABOLIC PANEL WITH GFR
Anion gap: 14 (ref 5–15)
BUN: 29 mg/dL — ABNORMAL HIGH (ref 8–23)
CO2: 20 mmol/L — ABNORMAL LOW (ref 22–32)
Calcium: 8.8 mg/dL — ABNORMAL LOW (ref 8.9–10.3)
Chloride: 105 mmol/L (ref 98–111)
Creatinine, Ser: 1.03 mg/dL — ABNORMAL HIGH (ref 0.44–1.00)
GFR, Estimated: >60 mL/min (ref >60)
Glucose, Bld: 108 mg/dL — ABNORMAL HIGH (ref 70–99)
Potassium: 4.5 mmol/L (ref 3.5–5.1)
Sodium: 139 mmol/L (ref 135–145)

## 2024-05-11 MED ORDER — IRBESARTAN 300 MG PO TABS
300.0000 mg | ORAL_TABLET | ORAL | Status: DC
Start: 2024-05-11 — End: 2024-05-15
  Administered 2024-05-11 – 2024-05-14 (×4): 300 mg via ORAL
  Filled 2024-05-11 (×4): qty 1

## 2024-05-11 MED ORDER — HYDROCHLOROTHIAZIDE 25 MG PO TABS
25.0000 mg | ORAL_TABLET | ORAL | Status: DC
  Administered 2024-05-11 – 2024-05-14 (×4): 25 mg via ORAL
  Filled 2024-05-11 (×4): qty 1

## 2024-05-11 MED ORDER — OLMESARTAN MEDOXOMIL-HCTZ 40-25 MG PO TABS: 1.0000 | ORAL_TABLET | Freq: Every evening | ORAL | Status: DC

## 2024-05-11 NOTE — TOC Progression Note (Addendum)
 Transition of Care [facility]) - Progression Note    Patient Details  Name: Sarah Chung MRN: [id number] Date of Birth: 1960-09-20  Transition of Care [facility]) CM/SW Contact  [name], [place] Phone Number: 05/11/2024, 11:51 AM  Clinical Narrative:   CSW spoke with patient about SNF recommendation and estimated cost for SNF. CSW informed patient that without knowing which facility she'd be going to CSW can only provide an estimate of SNF cost. CSW asked patients permission to do fax out patients referral. Patient stated yes. Patient stated she does not have any other income (SSI, SSDI).   CSW spoke with CSW that was following patient yesterday. CSW yesterday stated CSW reached out to [place] with financial counseling to start medicaid application.  Per patient, she will have medicare insurance starting November 1st.   4:24 PM CSW met patient at bedside and provided a list of current bed offers with medicare.gov ratings. Patient stated she has friends assisting her with researching SNFs. Per patient, a friend recommended [facility]. Patient stated she spoke with [facility] about how much it would cost to go to rehab. Per patient, [facility] stated it would be approximately $13,000 for room and board.   Current bed offers are: - [facility]: $12,450/ month  - [facility]: $12,630/ month - [facility]: $9,600/ month -[facility]: $ 9,750  CSW will continue to follow.    Expected Discharge Plan: Skilled Nursing Facility Barriers to Discharge: Continued Medical Work up, Inadequate or no insurance               Expected Discharge Plan and Services                                               Social Drivers of Health (SDOH) Interventions SDOH Screenings   Food Insecurity: No Food Insecurity (05/08/2024)  Housing: Low Risk  (05/08/2024)  Transportation Needs: No Transportation Needs (05/08/2024)  Utilities: Patient Declined (05/08/2024)  Depression (PHQ2-9):  Low Risk  (04/05/2024)  Financial Resource Strain: Low Risk  (10/19/2023)  Physical Activity: Unknown (10/19/2023)  Social Connections: Unknown (10/19/2023)  Stress: Stress Concern Present (10/19/2023)  Tobacco Use: Low Risk  (05/09/2024)    Readmission Risk Interventions     No data to display

## 2024-05-11 NOTE — Progress Notes (Signed)
 PROGRESS NOTE    MARRION Chung  FMW:[id number] DOB: [birth year] DOA: 05/08/2024 PCP: Jodie Lavern CROME, MD     Brief Narrative:  Sarah Chung is a 63 y.o. 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   She was admitted July 2025 and underwent lumbar laminectomy L4-5, L5-S1 by Dr. Joshua on 02/25/2024.  Perioperatively her Xarelto  was held and an IVC filter was placed.  Postoperatively she recovered well.  IVC filter was removed on 03/20/2024.   She has been working with PT at her home.  She says while walking up the stairs last week her ankle twisted and gave way resulting in significant pain.  She was seen in [facility] ED on 05/02/2024.  Ankle x-ray was negative for acute fracture. She had persistent pain and swelling and was seen in the [facility] clinic for follow-up.  X-rays did show a right ankle fracture.  She had follow-up CT of the right ankle yesterday 9/29.  This showed acute bimalleolar fracture of the right ankle, additional fractures of the 1st and 2nd tarsometatarsal joints indicated for Lisfranc ligament injury.  Tiny cortical avulsion fragments along the lateral talus and small bony fragment along the lateral calcaneal body also noted. She saw Dr. Barton in the [facility] clinic today.  He recommended that she come to the [facility] ED to be admitted under the hospitalist service with plan for surgery while in hospital.  Patient underwent ORIF of right ankle with Dr. Barton 10/1.   New events last 24 hours / Subjective: No further diarrhea.  Continues to have lower extremity pain.  Working with TOC on figuring out SNF plan.  Assessment & Plan:   Principal Problem:   Bimalleolar ankle fracture, right, closed, initial encounter Active Problems:   Seizure disorder  (HCC)   Chronic anticoagulation   Essential hypertension   Ankle fracture, bimalleolar, closed, right, initial encounter   Acute bimalleolar right ankle fracture after fall - Orthopedic surgery consulted - S/p ORIF 10/1 - NWB right lower extremity - PT OT - TOC consult  History of PE/DVT - Xarelto   AKI - Baseline creatinine 1 - Resolved  Acute urinary retention - Improved  Hypertension - Resume ARB-hydrochlorothiazide   Seizure disorder - Phenobarbital   Diarrhea - C. difficile/GI PCR canceled as no diarrhea last 24 hours   DVT prophylaxis:  rivaroxaban  (XARELTO ) tablet 20 mg Start: 05/10/24 1700 SCDs Start: 05/08/24 1926 rivaroxaban  (XARELTO ) tablet 20 mg  Code Status: Full code Family Communication: None at bedside Disposition Plan: SNF Status is: Inpatient Remains inpatient appropriate because: Placement     Antimicrobials:  Anti-infectives (From admission, onward)    Start     Dose/Rate Route Frequency Ordered Stop   05/09/24 2200  ceFAZolin  (ANCEF ) IVPB 1 g/50 mL premix        1 g 100 mL/hr over 30 Minutes Intravenous Every 8 hours 05/09/24 1823 05/10/24 1608   05/09/24 0600  levofloxacin (LEVAQUIN) IVPB 500 mg  Status:  Discontinued        500 mg 100 mL/hr over 60 Minutes Intravenous To ShortStay Surgical 05/08/24 2201 05/09/24 1623   05/09/24 0600  vancomycin (VANCOCIN) IVPB 1000 mg/200 mL premix  Status:  Discontinued        1,000 mg 200 mL/hr over 60 Minutes Intravenous To ShortStay Surgical 05/08/24 2201 05/09/24 1623  Objective: Vitals:   05/10/24 2109 05/11/24 0005 05/11/24 0333 05/11/24 0756  BP: (!) 152/91 (!) 148/82 139/75 (!) 157/98  Pulse: 80 76 84 73  Resp: 18 18 18 16   Temp: 98.3 F (36.8 C) 98.2 F (36.8 C) 97.8 F (36.6 C) (!) 97.5 F (36.4 C)  TempSrc:  Oral Oral Oral  SpO2: 98% 94% 93% 96%  Weight:      Height:        Intake/Output Summary (Last 24 hours) at 05/11/2024 1230 Last data filed at 05/11/2024  0900 Gross per 24 hour  Intake 384.75 ml  Output --  Net 384.75 ml   Filed Weights   05/09/24 1441  Weight: 92.1 kg    Examination: General exam: Appears calm and comfortable  Extremities: Right lower extremity in splint Psychiatry: Judgement and insight appear stable. Mood & affect appropriate.    Data Reviewed: I have personally reviewed following labs and imaging studies  CBC: Recent Labs  Lab 05/08/24 1310 05/09/24 0144 05/10/24 0504 05/11/24 0535  WBC 7.4 6.2 7.6 7.6  NEUTROABS 5.2  --   --   --   HGB 11.1* 9.0* 9.7* 9.4*  HCT 33.4* 28.0* 30.1* 28.3*  MCV 93.3 95.6 95.9 93.7  PLT 305 274 243 242   Basic Metabolic Panel: Recent Labs  Lab 05/08/24 1310 05/09/24 0144 05/10/24 0504 05/11/24 0535  NA 140 139 137 139  K 4.5 4.2 5.0 4.5  CL 104 107 104 105  CO2 23 21* 24 20*  GLUCOSE 108* 127* 91 108*  BUN 49* 46* 32* 29*  CREATININE 1.37* 1.64* 1.28* 1.03*  CALCIUM  9.3 8.5* 8.7* 8.8*   GFR: Estimated Creatinine Clearance: 60.7 mL/min (A) (by C-G formula based on SCr of 1.03 mg/dL (H)). Liver Function Tests: No results for input(s): AST, ALT, ALKPHOS, BILITOT, PROT, ALBUMIN in the last 168 hours. No results for input(s): LIPASE, AMYLASE in the last 168 hours. No results for input(s): AMMONIA in the last 168 hours. Coagulation Profile: Recent Labs  Lab 05/08/24 1310  INR 1.1   Cardiac Enzymes: No results for input(s): CKTOTAL, CKMB, CKMBINDEX, TROPONINI in the last 168 hours. BNP (last 3 results) No results for input(s): PROBNP in the last 8760 hours. HbA1C: No results for input(s): HGBA1C in the last 72 hours. CBG: No results for input(s): GLUCAP in the last 168 hours. Lipid Profile: No results for input(s): CHOL, HDL, LDLCALC, TRIG, CHOLHDL, LDLDIRECT in the last 72 hours. Thyroid Function Tests: No results for input(s): TSH, T4TOTAL, FREET4, T3FREE, THYROIDAB in the last 72 hours. Anemia  Panel: No results for input(s): VITAMINB12, FOLATE, FERRITIN, TIBC, IRON, RETICCTPCT in the last 72 hours. Sepsis Labs: No results for input(s): PROCALCITON, LATICACIDVEN in the last 168 hours.  Recent Results (from the past 240 hours)  Surgical pcr screen     Status: None   Collection Time: 05/09/24  9:32 AM   Specimen: Nasal Mucosa; Nasal Swab  Result Value Ref Range Status   MRSA, PCR NEGATIVE NEGATIVE Final   Staphylococcus aureus NEGATIVE NEGATIVE Final    Comment: (NOTE) The Xpert SA Assay (FDA approved for NASAL specimens in patients 73 years of age and older), is one component of a comprehensive surveillance program. It is not intended to diagnose infection nor to guide or monitor treatment. Performed at [facility], [address], [place], [place] [address]       Radiology Studies: DG Foot 2 Views Right Result Date: 05/09/2024 EXAM: 2 VIEW(S) XRAY OF  THE RIGHT FOOT 05/09/2024 06:00:00 PM COMPARISON: None available. CLINICAL HISTORY: Surgery, elective [id number]. Fl time: 1 minutes 13.7 seconds ; mGy: 0.41; OPEN REDUCTION INTERNAL FIXATION (ORIF) FOOT LISFRANC FRACTURE ; Dr. Barton ; RSTO : CMP; Fl time: 1 minutes 13.7 seconds ; mGy: 0.41; OPEN REDUCTION INTERNAL FIXATION (ORIF) FOOT LISFRANC FRACTURE ; Dr. Barton ; RSTO : CMP; Fl time: 1 minutes 13.7 seconds ; mGy: 0.41; Dose report attached to ankle order , Same procedure , could not get foot images off of the Right ankle order, I moved to right foot pictures containing the syndesmosis screws to this separate right foot order, but they would not come off the other ; order. It shows ankle hardware as well though. FINDINGS: BONES AND JOINTS: 2 laterally directed screws are present for fixation of a Lisfranc injury. The screws traverse the medial cuneiform and base of the 2nd metatarsal, as well as the medial and intermediate cuneiforms. Hardware appears intact. No joint dislocation. SOFT TISSUES: The soft  tissues are unremarkable. IMPRESSION: 1. Intact hardware for fixation of Lisfranc injury, including 2 laterally directed screws traversing the medial cuneiform and base of the 2nd metatarsal as well as the medial and intermediate cuneiforms. Electronically signed by: Donnice Mania MD 05/09/2024 07:19 PM EDT RP Workstation: [id number]   DG Ankle 2 Views Right Result Date: 05/09/2024 EXAM: 2 VIEW(S) XRAY OF THE RIGHT ANKLE 05/09/2024 06:00:00 PM CLINICAL HISTORY: [id number] Surgery, elective [id number]. OPEN REDUCTION INTERNAL FIXATION (ORIF) ANKLE FRACTURE ; Dr. Lillia Barton ; RSTO: CMP; Fl time: 1 minutes 13.7 seconds ; mGy: 0.41; Ankle and foot done as one exam, foot pictures moved to separate right foot order, but the foot pictures did not disappear from the ankle order. All pictures show ankle hardware however. OPEN REDUCTION INTERNAL FIXATION (ORIF) ANKLE FRACTURE COMPARISON: None available. FINDINGS: BONES AND JOINTS: Intraoperative radiographs of the right ankle postsurgical changes of open reduction internal fixation of bimalleolar right ankle fracture. Hardware is normally aligned and appears intact. Normal joint alignment. Additional laterally directed screws for fixation of Lisfranc injury. No acute fracture. No focal osseous lesion. No joint dislocation. SOFT TISSUES: The soft tissues are unremarkable. IMPRESSION: 1. Postsurgical changes of ORIF of bimalleolar right ankle fracture with normally aligned and intact hardware. 2. Additional laterally directed screws for fixation of Lisfranc injury. Electronically signed by: Donnice Mania MD 05/09/2024 07:17 PM EDT RP Workstation: [id number]   DG C-Arm 1-60 Min-No Report Result Date: 05/09/2024 Fluoroscopy was utilized by the requesting physician.  No radiographic interpretation.   DG C-Arm 1-60 Min-No Report Result Date: 05/09/2024 Fluoroscopy was utilized by the requesting physician.  No radiographic interpretation.      Scheduled Meds:   PHENobarbital   129.6 mg Oral QHS   rivaroxaban   20 mg Oral Q supper   Continuous Infusions:     LOS: 2 days   Time spent: 25 minutes   Delon Hoe, DO [facility] 05/11/2024, 12:30 PM   Available via Epic secure chat 7am-7pm After these hours, please refer to coverage provider listed on [email or web address]

## 2024-05-11 NOTE — NC FL2 (Signed)
 [place]  MEDICAID FL2 LEVEL OF CARE FORM     IDENTIFICATION  Patient Name: Sarah Chung Birthdate: 1961/04/30 Sex: female Admission Date (Current Location): 05/08/2024  [facility] and [place] Number:  Producer, television/film/video and Address:  [facility] [place], [address] [place], [place] [address]      Provider Number: [id number]  Attending Physician Name and Address:  Rojelio Nest, DO  Relative Name and Phone Number:  read,jessica (Daughter)  [phone] [facility])    Current Level of Care:   Recommended Level of Care: Skilled Nursing Facility Prior Approval Number:    Date Approved/Denied:   PASRR Number: [id number]  Discharge Plan: SNF    Current Diagnoses: Patient Active Problem List   Diagnosis Date Noted   Ankle fracture, bimalleolar, closed, right, initial encounter 05/09/2024   Bimalleolar ankle fracture, right, closed, initial encounter 05/08/2024   S/P lumbar laminectomy 02/24/2024   Statin myopathy 11/11/2023   Spinal osteoarthritis 10/20/2023   Vitamin D  deficiency 10/20/2023   Leg cramps 10/20/2023   Falls, subsequent encounter 08/01/2023   Colon polyps 06/22/2023   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   Gait difficulty 06/06/2023   Osteopenia 12/17/2022   Essential hypertension 09/20/2022   Depression, major, single episode, moderate (HCC) 09/01/2022   Chronic anticoagulation 06/22/2021   Seizure disorder (HCC) 08/27/2020   Mixed hyperlipidemia 06/20/2020   Obesity 06/20/2020   Nonobstructive atherosclerosis of coronary artery 04/20/2019   Pulmonary embolism (HCC) x2 02/09/2019    Orientation RESPIRATION BLADDER Height & Weight     Self, Time, Situation, Place  Normal Continent Weight: 203 lb 0.7 oz (92.1 kg) Height:  5' 4 (162.6 cm)  BEHAVIORAL SYMPTOMS/MOOD NEUROLOGICAL BOWEL NUTRITION STATUS      Continent Diet (Please discharge summary)  AMBULATORY STATUS COMMUNICATION OF NEEDS Skin   Extensive Assist Verbally Other  (Comment) (Wound - Surgical Closed Surgical Incision Leg)                       Personal Care Assistance Level of Assistance  Bathing, Dressing, Feeding Bathing Assistance: Limited assistance Feeding assistance: Independent Dressing Assistance: Limited assistance     Functional Limitations Info  Sight, Hearing, Speech Sight Info: Impaired (eyeglasses) Hearing Info: Adequate Speech Info: Adequate    SPECIAL CARE FACTORS FREQUENCY  PT (By licensed PT), OT (By licensed OT)     PT Frequency: 5x week OT Frequency: 5x week            Contractures Contractures Info: Not present    Additional Factors Info  Code Status, Allergies Code Status Info: Full Allergies Info: Carbamazepine, Ace Inhibitors, Cephalexin, Gadolinium Derivatives, Iodinated Contrast Media, Ketorolac, Ketorolac Tromethamine, Levetiracetam, Lisinopril , Lisinopril -hydrochlorothiazide , Sulfa Antibiotics           Current Medications (05/11/2024):  This is the current hospital active medication list Current Facility-Administered Medications  Medication Dose Route Frequency Provider Last Rate Last Admin   acetaminophen  (TYLENOL ) tablet 650 mg  650 mg Oral Q6H PRN Patel, Vishal R, MD   650 mg at 05/10/24 9480   Or   acetaminophen  (TYLENOL ) suppository 650 mg  650 mg Rectal Q6H PRN Patel, Vishal R, MD       fentaNYL  (SUBLIMAZE ) injection 25 mcg  25 mcg Intravenous Q2H PRN Rojelio Nest, DO   25 mcg at 05/11/24 1021   HYDROcodone -acetaminophen  (NORCO/VICODIN) 5-325 MG per tablet 1-2 tablet  1-2 tablet Oral Q4H PRN Patel, Vishal R, MD   2 tablet at 05/11/24 [phone]  methocarbamol  (ROBAXIN ) tablet 500 mg  500 mg Oral Q8H PRN Patel, Vishal R, MD   500 mg at 05/10/24 1946   ondansetron  (ZOFRAN ) tablet 4 mg  4 mg Oral Q6H PRN Patel, Vishal R, MD       Or   ondansetron  (ZOFRAN ) injection 4 mg  4 mg Intravenous Q6H PRN Patel, Vishal R, MD       PHENobarbital  (LUMINAL) tablet 129.6 mg  129.6 mg Oral QHS Patel, Vishal R,  MD   129.6 mg at 05/10/24 2105   rivaroxaban  (XARELTO ) tablet 20 mg  20 mg Oral Q supper Rojelio Nest, DO   20 mg at 05/10/24 1752     Discharge Medications: Please see discharge summary for a list of discharge medications.  Relevant Imaging Results:  Relevant Lab Results:   Additional Information SSN [id number]; self pay (applying for medicaid, patietent stated she will have medicare starting november 1st)  Jereld Presti C Kelsey Edman, LCSWA

## 2024-05-11 NOTE — Progress Notes (Signed)
 Physical Therapy Treatment Patient Details Name: Sarah Chung MRN: [id number] DOB: [birth year] Today's Date: 05/11/2024   History of Present Illness Pt is a 63 yo female admitted for ORIF of R ankle after R bimalleolar fx of R ankle and additional fx of 1st/2nd tarsometataral jts with lisfrac ligament injury. PMH: L4-L5/L5-S1 lumbar laminectomy in 02/2024. 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    PT Comments  The pt continues to make slow but steady progress with transfers and activity tolerance this session. She continues to need physical assistance to maintain NWB RLE as well as maxA of 2 and UE support on RW to maintain static standing for 4 bouts of <10 seconds. Pt needing max postural cues and cues for hand positioning and use of RW. Will continue to benefit from skilled PT acutely to progress functional LE strength, ROM, and activity tolerance. Noted pt does not meet medical complexity for acute inpatient rehab, recommendations updated to <3hours/day rehab as pt is still not capable of returning home from a functional standpoint.    If plan is discharge home, recommend the following: A lot of help with walking and/or transfers;A lot of help with bathing/dressing/bathroom;Assistance with cooking/housework;Help with stairs or ramp for entrance;Assist for transportation;Direct supervision/assist for medications management   Can travel by private vehicle     No  Equipment Recommendations  Wheelchair (measurements PT);Wheelchair cushion (measurements PT);Other (comment) (TBD with progress)    Recommendations for Other Services       Precautions / Restrictions Precautions Precautions: Fall Recall of Precautions/Restrictions: Intact Precaution/Restrictions Comments: L ossur foot-up brace (foot drop) Required Braces or Orthoses: Splint/Cast Splint/Cast: R lower leg cast Splint/Cast - Date Prophylactic Dressing Applied (if applicable):  05/09/24 Restrictions Weight Bearing Restrictions Per Provider Order: Yes RLE Weight Bearing Per Provider Order: Non weight bearing     Mobility  Bed Mobility Overal bed mobility: Needs Assistance Bed Mobility: Supine to Sit, Sit to Supine     Supine to sit: Min assist, HOB elevated Sit to supine: Min assist, +2 for safety/equipment   General bed mobility comments: able to bridge with LLE and scoot to EOB, using UE and rails, minA to help reposition and manage RLE    Transfers Overall transfer level: Needs assistance Equipment used: Rolling walker (2 wheels) Transfers: Sit to/from Stand Sit to Stand: Max assist, +2 physical assistance, From elevated surface           General transfer comment: completed x4 with mod-maxA of 2, cues to maintain RW on ground, L hand position, and assist to keep RLE NWB. limited standing tolerance    Ambulation/Gait               General Gait Details: pt unable to move LLE when standing or maintain standing >10 seconds despite maxA of 2   Stairs             Wheelchair Mobility     Tilt Bed    Modified Rankin (Stroke Patients Only)       Balance Overall balance assessment: Needs assistance, History of Falls Sitting-balance support: Feet supported, Bilateral upper extremity supported Sitting balance-Leahy Scale: Good     Standing balance support: Bilateral upper extremity supported, During functional activity Standing balance-Leahy Scale: Zero Standing balance comment: dependent on BUE support and maxA of 2  Communication Communication Communication: No apparent difficulties  Cognition Arousal: Alert Behavior During Therapy: WFL for tasks assessed/performed   PT - Cognitive impairments: No apparent impairments                       PT - Cognition Comments: pt can be tangential, but functional cognition in session Following commands: Intact      Cueing Cueing  Techniques: Verbal cues  Exercises      General Comments General comments (skin integrity, edema, etc.): VSS on RA      Pertinent Vitals/Pain Pain Assessment Pain Assessment: Faces Pain Score: 8  Faces Pain Scale: Hurts even more Pain Location: R ankle Pain Descriptors / Indicators: Aching, Grimacing, Operative site guarding Pain Intervention(s): Limited activity within patient's tolerance, Monitored during session, Premedicated before session, Repositioned     PT Goals (current goals can now be found in the care plan section) Acute Rehab PT Goals Patient Stated Goal: to gain independence with mobility PT Goal Formulation: With patient Time For Goal Achievement: 05/24/24 Potential to Achieve Goals: Good Progress towards PT goals: Progressing toward goals    Frequency    Min 3X/week       AM-PAC PT 6 Clicks Mobility   Outcome Measure  Help needed turning from your back to your side while in a flat bed without using bedrails?: A Little Help needed moving from lying on your back to sitting on the side of a flat bed without using bedrails?: A Little Help needed moving to and from a bed to a chair (including a wheelchair)?: Total Help needed standing up from a chair using your arms (e.g., wheelchair or bedside chair)?: Total Help needed to walk in hospital room?: Total Help needed climbing 3-5 steps with a railing? : Total 6 Click Score: 10    End of Session Equipment Utilized During Treatment: Gait belt Activity Tolerance: Patient limited by pain Patient left: in bed;with call bell/phone within reach;with bed alarm set Nurse Communication: Mobility status PT Visit Diagnosis: Unsteadiness on feet (R26.81);Other abnormalities of gait and mobility (R26.89);Muscle weakness (generalized) (M62.81);History of falling (Z91.81)     Time: 8479-8444 PT Time Calculation (min) (ACUTE ONLY): 35 min  Charges:    $Therapeutic Exercise: 8-22 mins $Therapeutic Activity: 8-22  mins PT General Charges $$ ACUTE PT VISIT: 1 Visit                     Izetta Call, PT, DPT   Acute Rehabilitation Department Office [phone] Secure Chat Communication Preferred   Izetta JULIANNA Call 05/11/2024, 4:20 PM

## 2024-05-11 NOTE — Progress Notes (Signed)
 Report called to Earnie, RN prior to Pt transfer/Bedside Report given upon arrival to Unit. Pt transferred via bed w/ Personal Belongings. Pt in no acute distress upon completion of transfer.

## 2024-05-11 NOTE — Plan of Care (Signed)
  Problem: Education: Goal: Knowledge of General Education information will improve Description: Including pain rating scale, medication(s)/side effects and non-pharmacologic comfort measures Outcome: Progressing   Problem: Clinical Measurements: Goal: Ability to maintain clinical measurements within normal limits will improve Outcome: Progressing Goal: Will remain free from infection Outcome: Progressing Goal: Diagnostic test results will improve Outcome: Progressing Goal: Respiratory complications will improve Outcome: Progressing Goal: Cardiovascular complication will be avoided Outcome: Progressing   Problem: Health Behavior/Discharge Planning: Goal: Ability to manage health-related needs will improve Outcome: Progressing   Problem: Nutrition: Goal: Adequate nutrition will be maintained Outcome: Progressing   Problem: Safety: Goal: Ability to remain free from injury will improve Outcome: Progressing   Problem: Skin Integrity: Goal: Risk for impaired skin integrity will decrease Outcome: Progressing   Problem: Pain Managment: Goal: General experience of comfort will improve and/or be controlled Outcome: Progressing

## 2024-05-12 LAB — BASIC METABOLIC PANEL WITH GFR
Anion gap: 11 (ref 5–15)
BUN: 30 mg/dL — ABNORMAL HIGH (ref 8–23)
CO2: 24 mmol/L (ref 22–32)
Calcium: 9.1 mg/dL (ref 8.9–10.3)
Chloride: 103 mmol/L (ref 98–111)
Creatinine, Ser: 1.07 mg/dL — ABNORMAL HIGH (ref 0.44–1.00)
GFR, Estimated: 58 mL/min — ABNORMAL LOW (ref >60)
Glucose, Bld: 93 mg/dL (ref 70–99)
Potassium: 4.5 mmol/L (ref 3.5–5.1)
Sodium: 138 mmol/L (ref 135–145)

## 2024-05-12 MED ORDER — DOCUSATE SODIUM 100 MG PO CAPS
100.0000 mg | ORAL_CAPSULE | Freq: Two times a day (BID) | ORAL | Status: DC | PRN
  Administered 2024-05-14: 100 mg via ORAL
  Filled 2024-05-12: qty 1

## 2024-05-12 NOTE — [place] Notes (Signed)
 [place]ESS NOTE    Sarah Chung  FMW:[id number] DOB: 1960-12-14 DOA: 05/08/2024 PCP: Jodie Lavern CROME, MD     Brief Narrative:  Sarah Chung is a 63 y.o. 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   She was admitted July 2025 and underwent lumbar laminectomy L4-5, L5-S1 by Dr. Joshua on 02/25/2024.  Perioperatively her Xarelto  was held and an IVC filter was placed.  Postoperatively she recovered well.  IVC filter was removed on 03/20/2024.   She has been working with PT at her home.  She says while walking up the stairs last week her ankle twisted and gave way resulting in significant pain.  [facility] was seen in [facility] on 05/02/2024.  Ankle x-ray was negative for acute fracture. She had persistent pain and swelling and was seen in the [facility] clinic for follow-up.  X-rays did show a right ankle fracture.  She had follow-up CT of the right ankle yesterday 9/29.  This showed acute bimalleolar fracture of the right ankle, additional fractures of the 1st and 2nd tarsometatarsal joints indicated for Lisfranc ligament injury.  Tiny cortical avulsion fragments along the lateral talus and small bony fragment along the lateral calcaneal body also noted. She saw Dr. Barton in the [facility] clinic today.  He recommended that she come to the [facility] ED to be admitted under the hospitalist service with plan for surgery while in hospital.  Patient underwent ORIF of right ankle with Dr. Barton 10/1.   New events last 24 hours / Subjective: Had BM yesterday which was normal. Continues to look into SNF, also wondering options for private home health aide   Assessment & Plan:   Principal Problem:   Bimalleolar ankle fracture, right, closed, initial encounter Active Problems:   Seizure  disorder (HCC)   Chronic anticoagulation   Essential hypertension   Ankle fracture, bimalleolar, closed, right, initial encounter   Acute bimalleolar right ankle fracture after fall - Orthopedic surgery consulted - S/p ORIF 10/1 - NWB right lower extremity - PT OT - TOC consulted   History of PE/DVT - Xarelto   AKI - Baseline creatinine 1 - Resolved  Acute urinary retention - Improved  Hypertension - ARB-hydrochlorothiazide   Seizure disorder - Phenobarbital   Diarrhea - Resolved without intervention    DVT prophylaxis:  rivaroxaban  (XARELTO ) tablet 20 mg Start: 05/10/24 1700 SCDs Start: 05/08/24 1926 rivaroxaban  (XARELTO ) tablet 20 mg  Code Status: Full code Family Communication: None at bedside Disposition Plan: SNF Status is: Inpatient Remains inpatient appropriate because: Placement     Antimicrobials:  Anti-infectives (From admission, onward)    Start     Dose/Rate Route Frequency Ordered Stop   05/09/24 2200  ceFAZolin  (ANCEF ) IVPB 1 g/50 mL premix        1 g 100 mL/hr over 30 Minutes Intravenous Every 8 hours 05/09/24 1823 05/10/24 1608   05/09/24 0600  levofloxacin (LEVAQUIN) IVPB 500 mg  Status:  Discontinued        500 mg 100 mL/hr over 60 Minutes Intravenous To ShortStay Surgical 05/08/24 2201 05/09/24 1623   05/09/24 0600  vancomycin (VANCOCIN) IVPB 1000 mg/200 mL premix  Status:  Discontinued        1,000 mg 200 mL/hr over 60 Minutes Intravenous To ShortStay Surgical 05/08/24 2201 05/09/24 1623  Objective: Vitals:   05/11/24 0756 05/11/24 1615 05/11/24 2141 05/12/24 0728  BP: (!) 157/98 (!) 157/83 132/67 (!) 155/93  Pulse: 73 90 78 72  Resp: 16 16 16 16   Temp: (!) 97.5 F (36.4 C) 98.5 F (36.9 C) 98.7 F (37.1 C) 98 F (36.7 C)  TempSrc: Oral Oral Oral Oral  SpO2: 96% 95% 95% 97%  Weight:      Height:        Intake/Output Summary (Last 24 hours) at 05/12/2024 0935 Last data filed at 05/12/2024 0200 Gross per 24 hour   Intake --  Output 300 ml  Net -300 ml   Filed Weights   05/09/24 1441  Weight: 92.1 kg    Examination: General exam: Appears calm and comfortable  Extremities: Right lower extremity in splint Psychiatry: Judgement and insight appear stable. Mood & affect appropriate.    Data Reviewed: I have personally reviewed following labs and imaging studies  CBC: Recent Labs  Lab 05/08/24 1310 05/09/24 0144 05/10/24 0504 05/11/24 0535  WBC 7.4 6.2 7.6 7.6  NEUTROABS 5.2  --   --   --   HGB 11.1* 9.0* 9.7* 9.4*  HCT 33.4* 28.0* 30.1* 28.3*  MCV 93.3 95.6 95.9 93.7  PLT 305 274 243 242   Basic Metabolic Panel: Recent Labs  Lab 05/08/24 1310 05/09/24 0144 05/10/24 0504 05/11/24 0535 05/12/24 0819  NA 140 139 137 139 138  K 4.5 4.2 5.0 4.5 4.5  CL 104 107 104 105 103  CO2 23 21* 24 20* 24  GLUCOSE 108* 127* 91 108* 93  BUN 49* 46* 32* 29* 30*  CREATININE 1.37* 1.64* 1.28* 1.03* 1.07*  CALCIUM  9.3 8.5* 8.7* 8.8* 9.1   GFR: Estimated Creatinine Clearance: 58.4 mL/min (A) (by C-G formula based on SCr of 1.07 mg/dL (H)). Liver Function Tests: No results for input(s): AST, ALT, ALKPHOS, BILITOT, PROT, ALBUMIN in the last 168 hours. No results for input(s): LIPASE, AMYLASE in the last 168 hours. No results for input(s): AMMONIA in the last 168 hours. Coagulation Profile: Recent Labs  Lab 05/08/24 1310  INR 1.1   Cardiac Enzymes: No results for input(s): CKTOTAL, CKMB, CKMBINDEX, TROPONINI in the last 168 hours. BNP (last 3 results) No results for input(s): PROBNP in the last 8760 hours. HbA1C: No results for input(s): HGBA1C in the last 72 hours. CBG: No results for input(s): GLUCAP in the last 168 hours. Lipid Profile: No results for input(s): CHOL, HDL, LDLCALC, TRIG, CHOLHDL, LDLDIRECT in the last 72 hours. Thyroid Function Tests: No results for input(s): TSH, T4TOTAL, FREET4, T3FREE, THYROIDAB in the last 72  hours. Anemia Panel: No results for input(s): VITAMINB12, FOLATE, FERRITIN, TIBC, IRON, RETICCTPCT in the last 72 hours. Sepsis Labs: No results for input(s): PROCALCITON, LATICACIDVEN in the last 168 hours.  Recent Results (from the past 240 hours)  Surgical pcr screen     Status: None   Collection Time: 05/09/24  9:32 AM   Specimen: Nasal Mucosa; Nasal Swab  Result Value Ref Range Status   MRSA, PCR NEGATIVE NEGATIVE Final   Staphylococcus aureus NEGATIVE NEGATIVE Final    Comment: (NOTE) The Xpert SA Assay (FDA approved for NASAL specimens in patients 69 years of age and older), is one component of a comprehensive surveillance program. It is not intended to diagnose infection nor to guide or monitor treatment. Performed at [facility], [address], [place], [place] [address]       Radiology Studies: No results found.  Scheduled Meds:  irbesartan   300 mg Oral Q24H   And   hydrochlorothiazide   25 mg Oral Q24H   PHENobarbital   129.6 mg Oral QHS   rivaroxaban   20 mg Oral Q supper   Continuous Infusions:     LOS: 3 days   Time spent: 25 minutes   Delon Hoe, DO [facility] 05/12/2024, 9:35 AM   Available via Epic secure chat 7am-7pm After these hours, please refer to coverage provider listed on [email or web address]

## 2024-05-12 NOTE — Plan of Care (Signed)

## 2024-05-13 NOTE — Plan of Care (Signed)

## 2024-05-13 NOTE — Plan of Care (Signed)

## 2024-05-13 NOTE — [place] Notes (Signed)
 PROGRESS NOTE    Sarah Chung  FMW:[id number] DOB: [birth year] DOA: 05/08/2024 PCP: Jodie Lavern CROME, MD     Brief Narrative:  Sarah Chung is a 63 y.o. 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   She was admitted July 2025 and underwent lumbar laminectomy L4-5, L5-S1 by Dr. Joshua on 02/25/2024.  Perioperatively her Xarelto  was held and an IVC filter was placed.  Postoperatively she recovered well.  IVC filter was removed on 03/20/2024.   She has been working with PT at her home.  She says while walking up the stairs last week her ankle twisted and gave way resulting in significant pain.  [facility] was [facility] on [facility]/02/2024.  Ankle x-ray was negative for acute fracture. She had persistent pain and swelling and was seen in the [facility] clinic for follow-up.  X-rays did show a right ankle fracture.  She had follow-up CT of the right ankle yesterday 9/29.  This showed acute bimalleolar fracture of the right ankle, additional fractures of the 1st and 2nd tarsometatarsal joints indicated for Lisfranc ligament injury.  Tiny cortical avulsion fragments along the lateral talus and small bony fragment along the lateral calcaneal body also noted. She saw Dr. Barton in the [facility] clinic today.  He recommended that she come to the [facility] ED to be admitted under the hospitalist service with plan for surgery while in hospital.  Patient underwent ORIF of right ankle with Dr. Barton 10/1.   New events last 24 hours / Subjective: No new issues  Assessment & Plan:   Principal Problem:   Bimalleolar ankle fracture, right, closed, initial encounter Active Problems:   Seizure disorder (HCC)   Chronic anticoagulation   Essential hypertension   Ankle fracture, bimalleolar,  closed, right, initial encounter   Acute bimalleolar right ankle fracture after fall - Orthopedic surgery consulted - S/p ORIF 10/1 - NWB right lower extremity - PT OT - TOC consulted   History of PE/DVT - Xarelto   AKI - Baseline creatinine 1 - Resolved  Acute urinary retention - Resolved  Hypertension - ARB-hydrochlorothiazide   Seizure disorder - Phenobarbital   Diarrhea - Resolved without intervention    DVT prophylaxis:  rivaroxaban  (XARELTO ) tablet 20 mg Start: 05/10/24 1700 SCDs Start: 05/08/24 1926 rivaroxaban  (XARELTO ) tablet 20 mg  Code Status: Full code Family Communication: None at bedside Disposition Plan: SNF Status is: Inpatient Remains inpatient appropriate because: Placement     Antimicrobials:  Anti-infectives (From admission, onward)    Start     Dose/Rate Route Frequency Ordered Stop   05/09/24 2200  ceFAZolin  (ANCEF ) IVPB 1 g/50 mL premix        1 g 100 mL/hr over 30 Minutes Intravenous Every 8 hours 05/09/24 1823 05/10/24 1608   05/09/24 0600  levofloxacin (LEVAQUIN) IVPB 500 mg  Status:  Discontinued        500 mg 100 mL/hr over 60 Minutes Intravenous To ShortStay Surgical 05/08/24 2201 05/09/24 1623   05/09/24 0600  vancomycin (VANCOCIN) IVPB 1000 mg/200 mL premix  Status:  Discontinued        1,000 mg 200 mL/hr over 60 Minutes Intravenous To ShortStay Surgical 05/08/24 2201 05/09/24 1623        Objective: Vitals:   05/12/24 0728 05/12/24 1606 05/12/24 2043 05/13/24 1000  BP: ROLLEN)  155/93 (!) 167/88 135/87 138/80  Pulse: 72 78 68 69  Resp: 16 16 15    Temp: 98 F (36.7 C) 98.2 F (36.8 C) 98 F (36.7 C) 98.5 F (36.9 C)  TempSrc: Oral Oral Oral Oral  SpO2: 97% 96% 96% 96%  Weight:      Height:        Intake/Output Summary (Last 24 hours) at 05/13/2024 1239 Last data filed at 05/12/2024 2238 Gross per 24 hour  Intake 120 ml  Output --  Net 120 ml   Filed Weights   05/09/24 1441  Weight: 92.1 kg     Examination: General exam: Appears calm and comfortable  Extremities: Right lower extremity in splint Psychiatry: Judgement and insight appear stable. Mood & affect appropriate.    Data Reviewed: I have personally reviewed following labs and imaging studies  CBC: Recent Labs  Lab 05/08/24 1310 05/09/24 0144 05/10/24 0504 05/11/24 0535  WBC 7.4 6.2 7.6 7.6  NEUTROABS 5.2  --   --   --   HGB 11.1* 9.0* 9.7* 9.4*  HCT 33.4* 28.0* 30.1* 28.3*  MCV 93.3 95.6 95.9 93.7  PLT 305 274 243 242   Basic Metabolic Panel: Recent Labs  Lab 05/08/24 1310 05/09/24 0144 05/10/24 0504 05/11/24 0535 05/12/24 0819  NA 140 139 137 139 138  K 4.5 4.2 5.0 4.5 4.5  CL 104 107 104 105 103  CO2 23 21* 24 20* 24  GLUCOSE 108* 127* 91 108* 93  BUN 49* 46* 32* 29* 30*  CREATININE 1.37* 1.64* 1.28* 1.03* 1.07*  CALCIUM  9.3 8.5* 8.7* 8.8* 9.1   GFR: Estimated Creatinine Clearance: 58.4 mL/min (A) (by C-G formula based on SCr of 1.07 mg/dL (H)). Liver Function Tests: No results for input(s): AST, ALT, ALKPHOS, BILITOT, PROT, ALBUMIN in the last 168 hours. No results for input(s): LIPASE, AMYLASE in the last 168 hours. No results for input(s): AMMONIA in the last 168 hours. Coagulation Profile: Recent Labs  Lab 05/08/24 1310  INR 1.1   Cardiac Enzymes: No results for input(s): CKTOTAL, CKMB, CKMBINDEX, TROPONINI in the last 168 hours. BNP (last 3 results) No results for input(s): PROBNP in the last 8760 hours. HbA1C: No results for input(s): HGBA1C in the last 72 hours. CBG: No results for input(s): GLUCAP in the last 168 hours. Lipid Profile: No results for input(s): CHOL, HDL, LDLCALC, TRIG, CHOLHDL, LDLDIRECT in the last 72 hours. Thyroid Function Tests: No results for input(s): TSH, T4TOTAL, FREET4, T3FREE, THYROIDAB in the last 72 hours. Anemia Panel: No results for input(s): VITAMINB12, FOLATE, FERRITIN, TIBC,  IRON, RETICCTPCT in the last 72 hours. Sepsis Labs: No results for input(s): PROCALCITON, LATICACIDVEN in the last 168 hours.  Recent Results (from the past 240 hours)  Surgical pcr screen     Status: None   Collection Time: 05/09/24  9:32 AM   Specimen: Nasal Mucosa; Nasal Swab  Result Value Ref Range Status   MRSA, PCR NEGATIVE NEGATIVE Final   Staphylococcus aureus NEGATIVE NEGATIVE Final    Comment: (NOTE) The Xpert SA Assay (FDA approved for NASAL specimens in patients 45 years of age and older), is one component of a comprehensive surveillance program. It is not intended to diagnose infection nor to guide or monitor treatment. Performed at [facility], [address] [place], [place] [address]       Radiology Studies: No results found.     Scheduled Meds:  irbesartan   300 mg Oral Q24H   And  hydrochlorothiazide   25 mg Oral Q24H   PHENobarbital   129.6 mg Oral QHS   rivaroxaban   20 mg Oral Q supper   Continuous Infusions:     LOS: 4 days   Time spent: 25 minutes   Delon Hoe, DO [facility] 05/13/2024, 12:39 PM   Available via Epic secure chat 7am-7pm After these hours, please refer to coverage provider listed on [email or web address]

## 2024-05-14 NOTE — [place] Notes (Signed)
 Occupational Therapy Treatment Patient Details Name: Sarah Chung MRN: [id number] DOB: 1960/11/06 Today's Date: 05/14/2024   History of present illness Pt is a 63 yo female admitted for ORIF of R ankle after R bimalleolar fx of R ankle and additional fx of 1st/2nd tarsometataral jts with lisfrac ligament injury. PMH: L4-L5/L5-S1 lumbar laminectomy in 02/2024. 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   OT comments  Pt making slow but steady progress toward all goals. Pt is very limited by NWB RLE and very weak L ankle. Instructed pt on how to direct care with use of STEDY to get onto a BSC or into the recliner for staff if a drop arm BSC is not available. Pt is scooting L and R well to do lateral scoot transfers but this is not possible unless a drop arm BSC is available. Pt stood x2 with mod A maintaining NWB status well as toileting and LE dressing was addressed. Will continue to focus on standing and mobility during adls. Rec less than 3 hours of therapy a day prior to returning home.       If plan is discharge home, recommend the following:  Two people to help with walking and/or transfers;A lot of help with bathing/dressing/bathroom;Assistance with cooking/housework;Assist for transportation;Help with stairs or ramp for entrance   Equipment Recommendations  Other (comment) (tbd)    Recommendations for Other Services      Precautions / Restrictions Precautions Precautions: Fall Recall of Precautions/Restrictions: Intact Precaution/Restrictions Comments: L ossur foot-up brace (foot drop) Required Braces or Orthoses: Splint/Cast Splint/Cast: R lower leg cast Splint/Cast - Date Prophylactic Dressing Applied (if applicable): 05/09/24 Restrictions Weight Bearing Restrictions Per Provider Order: Yes RLE Weight Bearing Per Provider Order: Non weight bearing       Mobility Bed Mobility Overal bed mobility: Needs Assistance Bed Mobility: Supine to  Sit     Supine to sit: Min assist, HOB elevated     General bed mobility comments: Assist to get into full sitting.  R cast is very heavy hindering movement.    Transfers Overall transfer level: Needs assistance Equipment used: Rolling walker (2 wheels) Transfers: Sit to/from Stand Sit to Stand: Mod assist, From elevated surface           General transfer comment: Pt transferred sit to stand x2 with RW and mod assist with one hand on walker and one pushing from chair. Pt maintained NWB to come to stand without assist. Pt continues to be unable to pivot with use of walker due to L ankle weakness. Transfer via Lift Equipment: Stedy   Balance Overall balance assessment: Needs assistance, History of Falls Sitting-balance support: Feet supported, Bilateral upper extremity supported Sitting balance-Leahy Scale: Good     Standing balance support: Bilateral upper extremity supported, During functional activity Standing balance-Leahy Scale: Poor Standing balance comment: dependent on walker and min assist of 1. Pt stood with recliner behind her and use of walker for 2 minutes with deep breathing encouraged as pt becomes very anxious.                           ADL either performed or assessed with clinical judgement   ADL Overall ADL's : Needs assistance/impaired Eating/Feeding: Independent;Sitting           Lower Body Bathing: Moderate assistance;Sitting/lateral leans;Sit to/from stand;Cueing for compensatory techniques Lower Body Bathing Details (indicate cue type and reason): Pt stood  EOB to wash between legs. Pt able to wash LE in bed but required assist between legs in standing bc pt holds to walker with both hands.     Lower Body Dressing: Moderate assistance;Sit to/from stand;Cueing for compensatory techniques Lower Body Dressing Details (indicate cue type and reason): Pt dresses LE in bed with mod assist rolling side to side to pull pants up. Pt can also dress  EOB (has reacher and sock aid at home) with mod assist to stand, maintain NWB and requires assist to pull pants up while she holds Bly to walker. Toilet Transfer: Moderate assistance;BSC/3in1;Requires drop arm (laterally scoot) Toilet Transfer Details (indicate cue type and reason): Pt unable at this time to pivot on her L foot. Easiest way for pt to get to [facility] is with the STEDY but pt also can laterally scoot if drop arm BSC is available.  Spoke to nursing about find one.  If one not available, use STEDY. Toileting- Clothing Manipulation and Hygiene: Moderate assistance;Sitting/lateral lean       Functional mobility during ADLs: Moderate assistance;+2 for physical assistance General ADL Comments: Pt is very limited due to new NWB status of RLE and very weak LLE making mobiity limited. Pt did well standing but mobilizing is more difficult. REC lateral scoot transfers and use of STEDY    Extremity/Trunk Assessment Upper Extremity Assessment Upper Extremity Assessment: Overall WFL for tasks assessed            Vision       Perception     Praxis     Communication Communication Communication: No apparent difficulties   Cognition Arousal: Alert Behavior During Therapy: WFL for tasks assessed/performed Cognition: No apparent impairments             OT - Cognition Comments: intact                 Following commands: Intact        Cueing   Cueing Techniques: Verbal cues  Exercises      Shoulder Instructions       General Comments Pt making progress with lateral scoots across bed to assist with mobility an sit to stand. Pt limited with pivot transfers due to LLe weakness and RLE NWB.  STEDY was helpful for getting to [facility] that is not drop arm.    Pertinent Vitals/ Pain       Pain Assessment Pain Assessment: 0-10 Pain Score: 4  Pain Location: R ankle Pain Descriptors / Indicators: Aching, Grimacing, Operative site guarding Pain Intervention(s): Limited  activity within patient's tolerance, Monitored during session, Repositioned  Home Living                                          Prior Functioning/Environment              Frequency  Min 2X/week        Progress Toward Goals  OT Goals(current goals can now be found in the care plan section)  [place] towards OT goals: Progressing toward goals  Acute Rehab OT Goals Patient Stated Goal: to be able to transfer on my own OT Goal Formulation: With patient Time For Goal Achievement: 05/24/24 Potential to Achieve Goals: Good ADL Goals Pt Will Perform Lower Body Bathing: with supervision;sit to/from stand Pt Will Perform Lower Body Dressing: with supervision;with adaptive equipment;sit to/from stand Pt Will Transfer to Toilet: with  supervision;stand pivot transfer;bedside commode Pt Will Perform Toileting - Clothing Manipulation and hygiene: with supervision;sit to/from stand Pt Will Perform Tub/Shower Transfer: Tub transfer;tub bench;with supervision  Plan      Co-evaluation                 AM-PAC OT 6 Clicks Daily Activity     Outcome Measure   Help from another person eating meals?: None Help from another person taking care of personal grooming?: None Help from another person toileting, which includes using toliet, bedpan, or urinal?: A Lot Help from another person bathing (including washing, rinsing, drying)?: A Lot Help from another person to put on and taking off regular upper body clothing?: A Little Help from another person to put on and taking off regular lower body clothing?: A Lot 6 Click Score: 17    End of Session Equipment Utilized During Treatment: Rolling walker (2 wheels);Other (comment) (STEDY)  OT Visit Diagnosis: Unsteadiness on feet (R26.81);Other abnormalities of gait and mobility (R26.89);Repeated falls (R29.6);Muscle weakness (generalized) (M62.81);Pain Pain - Right/Left: Right Pain - part of body: Ankle and joints of  foot   Activity Tolerance Patient limited by fatigue   Patient Left in chair;with call bell/phone within reach   Nurse Communication Mobility status;Need for lift equipment        Time: 0828-0903 OT Time Calculation (min): 35 min  Charges: OT General Charges $OT Visit: 1 Visit OT Treatments $Self Care/Home Management : 23-37 mins   Joshua Silvano Dragon 05/14/2024, 9:20 AM

## 2024-05-14 NOTE — Progress Notes (Signed)
 PROGRESS NOTE    Sarah Chung  FMW:[id number] DOB: March 19, 1961 DOA: 05/08/2024 PCP: Jodie Lavern CROME, MD     Brief Narrative:  Sarah Chung is a 63 y.o. 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   She was admitted July 2025 and underwent lumbar laminectomy L4-5, L5-S1 by Dr. Joshua on 02/25/2024.  Perioperatively her Xarelto  was held and an IVC filter was placed.  Postoperatively she recovered well.  IVC filter was removed on 03/20/2024.   She has been working with PT at her home.  She says while walking up the stairs last week her ankle twisted and gave way resulting in significant pain.  She was seen in [facility] ED on 05/02/2024.  Ankle x-ray was negative for acute fracture. She had persistent pain and swelling and was seen in the [facility] clinic for follow-up.  X-rays did show a right ankle fracture.  She had follow-up CT of the right ankle yesterday 9/29.  This showed acute bimalleolar fracture of the right ankle, additional fractures of the 1st and 2nd tarsometatarsal joints indicated for Lisfranc ligament injury.  Tiny cortical avulsion fragments along the lateral talus and small bony fragment along the lateral calcaneal body also noted. She saw Dr. Barton in the [facility] clinic today.  He recommended that she come to the [facility] ED to be admitted under the hospitalist service with plan for surgery while in hospital.  Patient underwent ORIF of right ankle with Dr. Barton 10/1.   New events last 24 hours / Subjective: Had a rough night last night with pain.  She worked with OT this morning and used STEDY to get to recliner and doing well.  Assessment & Plan:   Principal Problem:   Bimalleolar ankle fracture, right, closed, initial encounter Active Problems:    Seizure disorder (HCC)   Chronic anticoagulation   Essential hypertension   Ankle fracture, bimalleolar, closed, right, initial encounter   Acute bimalleolar right ankle fracture after fall - Orthopedic surgery consulted - S/p ORIF 10/1 - NWB right lower extremity - PT OT - TOC consulted   History of PE/DVT - Xarelto   AKI - Baseline creatinine 1 - Resolved  Acute urinary retention - Resolved  Hypertension - ARB-hydrochlorothiazide   Seizure disorder - Phenobarbital   Diarrhea - Resolved without intervention    DVT prophylaxis:  rivaroxaban  (XARELTO ) tablet 20 mg Start: 05/10/24 1700 SCDs Start: 05/08/24 1926 rivaroxaban  (XARELTO ) tablet 20 mg  Code Status: Full code Family Communication: None at bedside Disposition Plan: SNF Status is: Inpatient Remains inpatient appropriate because: Placement     Antimicrobials:  Anti-infectives (From admission, onward)    Start     Dose/Rate Route Frequency Ordered Stop   05/09/24 2200  ceFAZolin  (ANCEF ) IVPB 1 g/50 mL premix        1 g 100 mL/hr over 30 Minutes Intravenous Every 8 hours 05/09/24 1823 05/10/24 1608   05/09/24 0600  levofloxacin (LEVAQUIN) IVPB 500 mg  Status:  Discontinued        500 mg 100 mL/hr over 60 Minutes Intravenous To ShortStay Surgical 05/08/24 2201 05/09/24 1623   05/09/24 0600  vancomycin (VANCOCIN) IVPB 1000 mg/200 mL premix  Status:  Discontinued        1,000 mg 200 mL/hr over 60 Minutes Intravenous To ShortStay Surgical 05/08/24 2201 05/09/24 1623  Objective: Vitals:   05/13/24 1000 05/13/24 1949 05/14/24 0402 05/14/24 1000  BP: 138/80 115/63 116/60 133/66  Pulse: 69 74 71 91  Resp:  16 20 19   Temp: 98.5 F (36.9 C) 97.6 F (36.4 C) 98.4 F (36.9 C) 97.9 F (36.6 C)  TempSrc: Oral   Oral  SpO2: 96% 93% 97%   Weight:      Height:       No intake or output data in the 24 hours ending 05/14/24 1033  Filed Weights   05/09/24 1441  Weight: 92.1 kg     Examination: General exam: Appears calm and comfortable  Extremities: Right lower extremity in splint Psychiatry: Judgement and insight appear stable. Mood & affect appropriate.    Data Reviewed: I have personally reviewed following labs and imaging studies  CBC: Recent Labs  Lab 05/08/24 1310 05/09/24 0144 05/10/24 0504 05/11/24 0535  WBC 7.4 6.2 7.6 7.6  NEUTROABS 5.2  --   --   --   HGB 11.1* 9.0* 9.7* 9.4*  HCT 33.4* 28.0* 30.1* 28.3*  MCV 93.3 95.6 95.9 93.7  PLT 305 274 243 242   Basic Metabolic Panel: Recent Labs  Lab 05/08/24 1310 05/09/24 0144 05/10/24 0504 05/11/24 0535 05/12/24 0819  NA 140 139 137 139 138  K 4.5 4.2 5.0 4.5 4.5  CL 104 107 104 105 103  CO2 23 21* 24 20* 24  GLUCOSE 108* 127* 91 108* 93  BUN 49* 46* 32* 29* 30*  CREATININE 1.37* 1.64* 1.28* 1.03* 1.07*  CALCIUM  9.3 8.5* 8.7* 8.8* 9.1   GFR: Estimated Creatinine Clearance: 58.4 mL/min (A) (by C-G formula based on SCr of 1.07 mg/dL (H)). Liver Function Tests: No results for input(s): AST, ALT, ALKPHOS, BILITOT, PROT, ALBUMIN in the last 168 hours. No results for input(s): LIPASE, AMYLASE in the last 168 hours. No results for input(s): AMMONIA in the last 168 hours. Coagulation Profile: Recent Labs  Lab 05/08/24 1310  INR 1.1   Cardiac Enzymes: No results for input(s): CKTOTAL, CKMB, CKMBINDEX, TROPONINI in the last 168 hours. BNP (last 3 results) No results for input(s): PROBNP in the last 8760 hours. HbA1C: No results for input(s): HGBA1C in the last 72 hours. CBG: No results for input(s): GLUCAP in the last 168 hours. Lipid Profile: No results for input(s): CHOL, HDL, LDLCALC, TRIG, CHOLHDL, LDLDIRECT in the last 72 hours. Thyroid Function Tests: No results for input(s): TSH, T4TOTAL, FREET4, T3FREE, THYROIDAB in the last 72 hours. Anemia Panel: No results for input(s): VITAMINB12, FOLATE, FERRITIN, TIBC,  IRON, RETICCTPCT in the last 72 hours. Sepsis Labs: No results for input(s): PROCALCITON, LATICACIDVEN in the last 168 hours.  Recent Results (from the past 240 hours)  Surgical pcr screen     Status: None   Collection Time: 05/09/24  9:32 AM   Specimen: Nasal Mucosa; Nasal Swab  Result Value Ref Range Status   MRSA, PCR NEGATIVE NEGATIVE Final   Staphylococcus aureus NEGATIVE NEGATIVE Final    Comment: (NOTE) The Xpert SA Assay (FDA approved for NASAL specimens in patients 20 years of age and older), is one component of a comprehensive surveillance program. It is not intended to diagnose infection nor to guide or monitor treatment. Performed at [facility], [address] [place], [place] [address]       Radiology Studies: No results found.     Scheduled Meds:  irbesartan   300 mg Oral Q24H   And   hydrochlorothiazide   25 mg Oral  Q24H   PHENobarbital   129.6 mg Oral QHS   rivaroxaban   20 mg Oral Q supper   Continuous Infusions:     LOS: 5 days   Time spent: 25 minutes   Delon Hoe, DO [facility] 05/14/2024, 10:33 AM   Available via Epic secure chat 7am-7pm After these hours, please refer to coverage provider listed on [email or web address]

## 2024-05-14 NOTE — Progress Notes (Signed)
 Physical Therapy Treatment Patient Details Name: Sarah Chung MRN: [id number] DOB: 02/23/1961 Today's Date: 05/14/2024   History of Present Illness Pt is a 63 yo female admitted for ORIF of R ankle after R bimalleolar fx of R ankle and additional fx of 1st/2nd tarsometataral jts with lisfrac ligament injury. PMH: L4-L5/L5-S1 lumbar laminectomy in 02/2024. 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    PT Comments  Received pt sitting in recliner reporting urge to toilet. Stood in [place] with mod A and transferred to/from toilet with bedside commode over top dependently. Pt able to stand from [facility] flaps with CGA, continent of bladder, and dependent for hygiene management in standing. Pt required mod A to stand in [place] from bedside commode and returned to recliner. Reviewed seated exercises to work on throughout the day to promote strength/ROM. Pt continues to be limited by RLE NWB precautions and weakness in L ankle limiting ability to pivot or hop. Recommend continued therapy (<3 hours/day), however pt may be a good candidate for inpatient rehab pending progress. Acute PT to cont to follow.    If plan is discharge home, recommend the following: A lot of help with walking and/or transfers;A lot of help with bathing/dressing/bathroom;Assistance with cooking/housework;Help with stairs or ramp for entrance;Assist for transportation;Direct supervision/assist for medications management   Can travel by private vehicle     No  Equipment Recommendations  Wheelchair (measurements PT);Wheelchair cushion (measurements PT);Other (comment)    Recommendations for Other Services       Precautions / Restrictions Precautions Precautions: Fall Recall of Precautions/Restrictions: Intact Precaution/Restrictions Comments: L ossur foot-up brace (foot drop) Required Braces or Orthoses: Splint/Cast Splint/Cast: R lower leg cast Splint/Cast - Date Prophylactic Dressing  Applied (if applicable): 05/09/24 Restrictions Weight Bearing Restrictions Per Provider Order: Yes RLE Weight Bearing Per Provider Order: Non weight bearing     Mobility  Bed Mobility               General bed mobility comments: in recliner at start and end of session Patient Response: Anxious, Cooperative  Transfers Overall transfer level: Needs assistance   Transfers: Sit to/from Stand, Bed to chair/wheelchair/BSC Sit to Stand: Mod assist, From elevated surface, Via lift equipment           General transfer comment: Stood from recliner with Stedy and mod A and transferred to/from toilet with bedside commode over top dependently. Stood from bedside commode with Stedy and mod A to return to recliner. Pt able to stand from [facility] flaps with CGA Transfer via Lift Equipment: Stedy  Ambulation/Gait               General Gait Details: unable   Stairs             Wheelchair Mobility     Tilt Bed Tilt Bed Patient Response: Anxious, Cooperative  Modified Rankin (Stroke Patients Only)       Balance Overall balance assessment: Needs assistance Sitting-balance support: Feet supported, Bilateral upper extremity supported Sitting balance-Leahy Scale: Good     Standing balance support: Bilateral upper extremity supported, During functional activity (Stedy) Standing balance-Leahy Scale: Poor Standing balance comment: required use of Stedy; pt extremly anxious with movement                            Communication Communication Communication: No apparent difficulties  Cognition Arousal: Alert Behavior During Therapy: WFL for tasks assessed/performed  PT - Cognitive impairments: No apparent impairments                       PT - Cognition Comments: pt can be tangential, but remains motivated Following commands: Intact      Cueing Cueing Techniques: Verbal cues  Exercises      General Comments        Pertinent Vitals/Pain  Pain Assessment Pain Assessment: Faces Faces Pain Scale: Hurts a little bit Pain Location: R ankle Pain Descriptors / Indicators: Aching, Grimacing, Operative site guarding, Heaviness Pain Intervention(s): Limited activity within patient's tolerance, Monitored during session, Premedicated before session, Repositioned    Home Living                          Prior Function            PT Goals (current goals can now be found in the care plan section) Acute Rehab PT Goals Patient Stated Goal: to gain independence with mobility PT Goal Formulation: With patient Time For Goal Achievement: 05/24/24 Potential to Achieve Goals: Good Progress towards PT goals: Progressing toward goals    Frequency    Min 3X/week      PT Plan      Co-evaluation              AM-PAC PT 6 Clicks Mobility   Outcome Measure  Help needed turning from your back to your side while in a flat bed without using bedrails?: A Little Help needed moving from lying on your back to sitting on the side of a flat bed without using bedrails?: A Little Help needed moving to and from a bed to a chair (including a wheelchair)?: Total Help needed standing up from a chair using your arms (e.g., wheelchair or bedside chair)?: Total Help needed to walk in hospital room?: Total Help needed climbing 3-5 steps with a railing? : Total 6 Click Score: 10    End of Session Equipment Utilized During Treatment: Gait belt Activity Tolerance: Patient limited by pain Patient left: with call bell/phone within reach;with bed alarm set;in chair Nurse Communication: Mobility status PT Visit Diagnosis: Unsteadiness on feet (R26.81);Other abnormalities of gait and mobility (R26.89);Muscle weakness (generalized) (M62.81);History of falling (Z91.81);Difficulty in walking, not elsewhere classified (R26.2);Pain Pain - Right/Left: Right Pain - part of body: Ankle and joints of foot     Time: 8842-8778 PT Time  Calculation (min) (ACUTE ONLY): 24 min  Charges:    $Therapeutic Activity: 23-37 mins PT General Charges $$ ACUTE PT VISIT: 1 Visit                     Therisa Stains PT, DPT Therisa HERO Zaunegger 05/14/2024, 1:13 PM

## 2024-05-14 NOTE — Plan of Care (Signed)
  Problem: Education: Goal: Knowledge of General Education information will improve Description: Including pain rating scale, medication(s)/side effects and non-pharmacologic comfort measures Outcome: Progressing   Problem: Health Behavior/Discharge Planning: Goal: Ability to manage health-related needs will improve Outcome: Progressing   Problem: Clinical Measurements: Goal: Ability to maintain clinical measurements within normal limits will improve Outcome: Progressing Goal: Will remain free from infection Outcome: Progressing   Problem: Activity: Goal: Risk for activity intolerance will decrease Outcome: Progressing   Problem: Elimination: Goal: Will not experience complications related to bowel motility Outcome: Progressing   Problem: Safety: Goal: Ability to remain free from injury will improve Outcome: Progressing   Problem: Skin Integrity: Goal: Risk for impaired skin integrity will decrease Outcome: Progressing

## 2024-05-14 NOTE — TOC Progression Note (Addendum)
 Transition of Care [facility]) - Progression Note    Patient Details  Name: Sarah Chung MRN: [id number] Date of Birth: [birth year]  Transition of [facility]) CM/SW Contact  Lauraine FORBES Saa, LCSWA Phone Number: 05/14/2024, 3:17 PM  Clinical Narrative:     3:17 PM CSW introduced self and role to patient. CSW followed up with patient on SNF decision. Patient declined provided SNF options ([address], [address], [address], [facility]) due to ratings. Patient expressed preference in [facility]. CSW informed patient that they are medically ready to discharge and requested a second choice if [facility] SNF was unable to offer bed. Patient expressed interest in [facility]. CSW informed patient that [facility] and [facility] had yet offered bed. CSW provided patient additional SNF options ([facility], [facility]) with SNF quarterly Medicare ratings. Patient declined [facility] bed offer and stated that she would consider [facility] SNF bed pending additional research. Patient stated that she is able to afford the approximate $10,000-$13,000 up front cost for SNF. CSW contacted [facility] SNF and [facility] for decision. CSW will continue to follow.  4:45 PM CSW returned to patient's bedside to provide additional SNF bed offer [facility]) with quarterly Medicare ratings. Patient confirmed she spoke with [facility] SNF admissions who stated they could admit patient tomorrow. CSW contacted [facility] SNF admissions who confirmed they could admit patient tomorrow. CSW made medical team aware.   Expected Discharge Plan: Skilled Nursing Facility Barriers to Discharge: Continued Medical Work up, Inadequate or no insurance               Expected Discharge Plan and Services                                               Social Drivers of Health (SDOH) Interventions SDOH Screenings   Food Insecurity: No Food Insecurity (05/08/2024)  Housing: Low Risk  (05/08/2024)   Transportation Needs: No Transportation Needs (05/08/2024)  Utilities: Patient Declined (05/08/2024)  Depression (PHQ2-9): Low Risk  (04/05/2024)  Financial Resource Strain: Low Risk  (10/19/2023)  Physical Activity: Unknown (10/19/2023)  Social Connections: Unknown (10/19/2023)  Stress: Stress Concern Present (10/19/2023)  Tobacco Use: Low Risk  (05/09/2024)    Readmission Risk Interventions     No data to display

## 2024-05-15 MED ORDER — METHOCARBAMOL 500 MG PO TABS: 500.0000 mg | ORAL_TABLET | Freq: Three times a day (TID) | ORAL | 1 refills | Status: AC | PRN

## 2024-05-15 NOTE — TOC Transition Note (Signed)
 Transition of Care [facility]) - Discharge Note   Patient Details  Name: Sarah Chung MRN: [id number] Date of Birth: 1961-01-20  [facility] of [facility]) CM/SW Contact:  Sarah Chung Saa, LCSWA Phone Number: 05/15/2024, 10:07 AM   Clinical Narrative:     [facility] Anticipated DC date: 05/15/2024 Family notified: Harlene Read; [phone] Transport by: [facility]   Per MD patient ready for DC to [facility]. RN to call report prior to discharge [phone]). RN, patient, patient's family (by patient), and facility notified of DC. Discharge Summary and FL2 sent to facility. DC packet on chart. Ambulance transport requested for patient at 10:05.   CSW will sign off for now as social work intervention is no longer needed. Please consult us  again if new needs arise.    Final next level of care: Skilled Nursing Facility Barriers to Discharge: Barriers Resolved   Patient Goals and CMS Choice Patient states their goals for this hospitalization and ongoing recovery are:: SNF CMS Medicare.gov Compare Post Acute Care list provided to:: Patient Choice offered to / list presented to : Patient      Discharge Placement              Patient [facility] at[facility] Patient to be transferred to facility by: [facility] Name of family member notified: Harlene Ferretti; Daughter; [phone] Patient and family notified of of transfer: 05/15/24  Discharge Plan and Services Additional resources added to the After Visit Summary for                                       Social Drivers of Health (SDOH) Interventions SDOH Screenings   Food Insecurity: No Food Insecurity (05/08/2024)  Housing: Low Risk  (05/08/2024)  Transportation Needs: No Transportation Needs (05/08/2024)  Utilities: Patient Declined (05/08/2024)  Depression (PHQ2-9): Low Risk  (04/05/2024)  Financial Resource Strain: Low Risk  (10/19/2023)  Physical Activity: Unknown (10/19/2023)  Social  Connections: Unknown (10/19/2023)  Stress: Stress Concern Present (10/19/2023)  Tobacco Use: Low Risk  (05/09/2024)     Readmission Risk Interventions     No data to display

## 2024-05-15 NOTE — Progress Notes (Signed)
 Report given to [name] at [facility]. All questions and concerns answered. Sarah Chung

## 2024-05-15 NOTE — Plan of Care (Signed)

## 2024-05-15 NOTE — Progress Notes (Signed)
 Attempted to give report to [facility] [phone] voicemail left with callback number [phone]. Sherline Jubilee

## 2024-05-15 NOTE — Progress Notes (Signed)
 AVS printed and included into packet. Packet hand transporter. IV removed. Patient denied missing valuables. Sherline Jubilee

## 2024-05-15 NOTE — Plan of Care (Signed)

## 2024-05-15 NOTE — Discharge Summary (Signed)
 Physician Discharge Summary  Sarah Chung FMW:[id number] DOB: 05-22-1961 DOA: 05/08/2024  PCP: Jodie Lavern CROME, MD  Admit date: 05/08/2024 Discharge date: 05/15/2024  Admitted From: Home Disposition:  SNF  Recommendations for Outpatient Follow-up:  Follow up with Dr. Barton 7-10 days for wound check and 2-3 weeks for suture removal   Discharge Condition: Stable CODE STATUS: Full  Diet recommendation: Regular   Brief/Interim Summary: Sarah Chung is a 63 y.o. 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   She was admitted July 2025 and underwent lumbar laminectomy L4-5, L5-S1 by Dr. Joshua on 02/25/2024.  Perioperatively her Xarelto  was held and an IVC filter was placed.  Postoperatively she recovered well.  IVC filter was removed on 03/20/2024.   She has been working with PT at her home.  She says while walking up the stairs last week her ankle twisted and gave way resulting in significant pain.  She was seen in [facility] ED on 05/02/2024.  Ankle x-ray was negative for acute fracture. She had persistent pain and swelling and was seen in the [facility] clinic for follow-up.  X-rays did show a right ankle fracture.  She had follow-up CT of the right ankle yesterday 9/29.  This showed acute bimalleolar fracture of the right ankle, additional fractures of the 1st and 2nd tarsometatarsal joints indicated for Lisfranc ligament injury.  Tiny cortical avulsion fragments along the lateral talus and small bony fragment along the lateral calcaneal body also noted. She saw Dr. Barton in the [facility] clinic today.  He recommended that she come to the [facility] ED to be admitted under the hospitalist service with plan for surgery while in hospital.   Patient underwent ORIF of right ankle with  Dr. Barton 10/1.   Discharge Diagnoses:   Principal Problem:   Bimalleolar ankle fracture, right, closed, initial encounter Active Problems:   Seizure disorder (HCC)   Chronic anticoagulation   Essential hypertension   Ankle fracture, bimalleolar, closed, right, initial encounter   Acute bimalleolar right ankle fracture after fall - Orthopedic surgery consulted - S/p ORIF 10/1 - NWB right lower extremity - PT OT - Recommended to discharge to SNF    History of PE/DVT - Xarelto    AKI - Baseline creatinine 1 - Resolved   Acute urinary retention - Resolved   Hypertension - ARB-hydrochlorothiazide    Seizure disorder - Phenobarbital    Diarrhea - Resolved without intervention   Discharge Instructions  Discharge Instructions     Increase activity slowly   Complete by: As directed    Leave dressing on - Keep it clean, dry, and intact until clinic visit   Complete by: As directed    No wound care   Complete by: As directed    Other Restrictions   Complete by: As directed    Non weight bearing right lower extremity      Allergies as of 05/15/2024       Reactions   Carbamazepine Hives, Rash   Ace Inhibitors Cough   Cephalexin    Other Reaction(s): feel crazy   Gadolinium Derivatives    unknown   Iodinated Contrast Media Nausea Only   Ketorolac    Other Reaction(s): unknown   Ketorolac Tromethamine    unknown   Levetiracetam    Feels outside her body.SABRAloopy Other Reaction(s): feels  outside her body (loopy)   Lisinopril     Other Reaction(s): cough   Lisinopril -hydrochlorothiazide  Cough   Sulfa Antibiotics Hives, Rash        Medication List     TAKE these medications    methocarbamol  500 MG tablet Commonly known as: ROBAXIN  Take 1 tablet (500 mg total) by mouth every 8 (eight) hours as needed for muscle spasms.   olmesartan -hydrochlorothiazide  40-25 MG tablet Commonly known as: Benicar  HCT Take 1 tablet by mouth daily. What changed: when  to take this   ondansetron  8 MG disintegrating tablet Commonly known as: ZOFRAN -ODT Take 1 tablet (8 mg total) by mouth every 8 (eight) hours as needed for nausea or vomiting.   oxyCODONE  5 MG immediate release tablet Commonly known as: Roxicodone  Take 1 tablet (5 mg total) by mouth every 4 (four) hours as needed for up to 7 days for severe pain (pain score 7-10) (postop pain ankle). What changed:  when to take this reasons to take this   PHENobarbital  64.8 MG tablet Commonly known as: LUMINAL Take 2 tablets (129.6 mg total) by mouth at bedtime.   Xarelto  20 MG Tabs tablet Generic drug: rivaroxaban  TAKE 1 TABLET(20 MG) BY MOUTH DAILY WITH SUPPER               Discharge Care Instructions  (From admission, onward)           Start     Ordered   05/15/24 0000  Leave dressing on - Keep it clean, dry, and intact until clinic visit        05/15/24 0936            Contact information for follow-up providers     Barton Drape, MD. Schedule an appointment as soon as possible for a visit in 1 week(s).   Specialty: Orthopedic Surgery Why: Follow up with Dr. Barton 7-10 days for wound check and 2-3 weeks for suture removal Contact information: [address] [place] [address] [phone]              Contact information for after-discharge care     Destination     [facility] .   Service: Skilled Nursing Contact information: [address] [place]  [address] [phone]                    Allergies  Allergen Reactions   Carbamazepine Hives and Rash   Ace Inhibitors Cough   Cephalexin     Other Reaction(s): feel crazy   Gadolinium Derivatives     unknown   Iodinated Contrast Media Nausea Only   Ketorolac     Other Reaction(s): unknown   Ketorolac Tromethamine     unknown   Levetiracetam     Feels outside her body.SABRAloopy  Other Reaction(s): feels outside her body (loopy)   Lisinopril       Other Reaction(s): cough   Lisinopril -Hydrochlorothiazide  Cough   Sulfa Antibiotics Hives and Rash    Consultations: Orthopedic surgery    Procedures/Studies: DG Foot 2 Views Right Result Date: 05/09/2024 EXAM: 2 VIEW(S) XRAY OF THE RIGHT FOOT 05/09/2024 06:00:00 PM COMPARISON: None available. CLINICAL HISTORY: Surgery, elective [id number]. Fl time: 1 minutes 13.7 seconds ; mGy: 0.41; OPEN REDUCTION INTERNAL FIXATION (ORIF) FOOT LISFRANC FRACTURE ; Dr. Barton ; RSTO : CMP; Fl time: 1 minutes 13.7 seconds ; mGy: 0.41; OPEN REDUCTION INTERNAL FIXATION (ORIF) FOOT LISFRANC FRACTURE ; Dr. Barton ; RSTO : CMP; Fl time: 1 minutes 13.7 seconds ;  mGy: 0.41; Dose report attached to ankle order , Same procedure , could not get foot images off of the Right ankle order, I moved to right foot pictures containing the syndesmosis screws to this separate right foot order, but they would not come off the other ; order. It shows ankle hardware as well though. FINDINGS: BONES AND JOINTS: 2 laterally directed screws are present for fixation of a Lisfranc injury. The screws traverse the medial cuneiform and base of the 2nd metatarsal, as well as the medial and intermediate cuneiforms. Hardware appears intact. No joint dislocation. SOFT TISSUES: The soft tissues are unremarkable. IMPRESSION: 1. Intact hardware for fixation of Lisfranc injury, including 2 laterally directed screws traversing the medial cuneiform and base of the 2nd metatarsal as well as the medial and intermediate cuneiforms. Electronically signed by: Donnice Mania MD 05/09/2024 07:19 PM EDT RP Workstation: [id number]   DG Ankle 2 Views Right Result Date: 05/09/2024 EXAM: 2 VIEW(S) XRAY OF THE RIGHT ANKLE 05/09/2024 06:00:00 PM CLINICAL HISTORY: [id number] Surgery, elective [id number]. OPEN REDUCTION INTERNAL FIXATION (ORIF) ANKLE FRACTURE ; Dr. Lillia Mountain ; RSTO: CMP; Fl time: 1 minutes 13.7 seconds ; mGy: 0.41; Ankle and foot done as one exam, foot  pictures moved to separate right foot order, but the foot pictures did not disappear from the ankle order. All pictures show ankle hardware however. OPEN REDUCTION INTERNAL FIXATION (ORIF) ANKLE FRACTURE COMPARISON: None available. FINDINGS: BONES AND JOINTS: Intraoperative radiographs of the right ankle postsurgical changes of open reduction internal fixation of bimalleolar right ankle fracture. Hardware is normally aligned and appears intact. Normal joint alignment. Additional laterally directed screws for fixation of Lisfranc injury. No acute fracture. No focal osseous lesion. No joint dislocation. SOFT TISSUES: The soft tissues are unremarkable. IMPRESSION: 1. Postsurgical changes of ORIF of bimalleolar right ankle fracture with normally aligned and intact hardware. 2. Additional laterally directed screws for fixation of Lisfranc injury. Electronically signed by: Donnice Mania MD 05/09/2024 07:17 PM EDT RP Workstation: [id number]   DG C-Arm 1-60 Min-No Report Result Date: 05/09/2024 Fluoroscopy was utilized by the requesting physician.  No radiographic interpretation.   DG C-Arm 1-60 Min-No Report Result Date: 05/09/2024 Fluoroscopy was utilized by the requesting physician.  No radiographic interpretation.   VAS US  LOWER EXTREMITY VENOUS (DVT) (7a-7p) Result Date: 05/09/2024  Lower Venous DVT Study Patient Name:  Sarah Chung  Date of Exam:   05/08/2024 Medical Rec #: [id number]    Accession #:    [id number] Date of Birth: 05/16/1961   Patient Gender: F Patient Age:   63 years Exam Location:  [facility] Procedure:      VAS US  LOWER EXTREMITY VENOUS (DVT) Referring Phys: LONNI CAMP --------------------------------------------------------------------------------  Indications: Pain, and Swelling. Other Indications: Fall at PT last wednesday that resulted in foot and ankle                    fracture. Check for DVT prior to surgery. Risk Factors: Hx of DVT/PE. Anticoagulation: Xarelto . Limitations:  Poor ultrasound/tissue interface and pain intolerance. 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 Bilateral testing is considered an integral part of a complete examination. Limited examinations for reoccurring indications  may be performed as noted. The reflux portion of the exam is performed with the patient in reverse Trendelenburg.  +---------+---------------+---------+-----------+----------+--------------+ RIGHT    CompressibilityPhasicitySpontaneityPropertiesThrombus Aging +---------+---------------+---------+-----------+----------+--------------+ CFV      Full           Yes      Yes                                 +---------+---------------+---------+-----------+----------+--------------+ SFJ      Full                                                        +---------+---------------+---------+-----------+----------+--------------+ FV Prox  Full           Yes      Yes                                 +---------+---------------+---------+-----------+----------+--------------+ FV Mid   Full           Yes      Yes                                 +---------+---------------+---------+-----------+----------+--------------+ FV DistalFull           Yes      Yes                                 +---------+---------------+---------+-----------+----------+--------------+ PFV      Full                                                        +---------+---------------+---------+-----------+----------+--------------+ POP      Full           Yes      Yes                                 +---------+---------------+---------+-----------+----------+--------------+ PTV      Full                                                         +---------+---------------+---------+-----------+----------+--------------+ PERO     Full                                                        +---------+---------------+---------+-----------+----------+--------------+   +----+---------------+---------+-----------+----------+--------------+ LEFTCompressibilityPhasicitySpontaneityPropertiesThrombus Aging +----+---------------+---------+-----------+----------+--------------+ CFV Full           Yes      Yes                                 +----+---------------+---------+-----------+----------+--------------+  Summary: RIGHT: - There is no evidence of deep vein thrombosis in the lower extremity.  - A cystic structure is found in the popliteal fossa (4.32 x 0.58 x 3.33 cm).  LEFT: - No evidence of common femoral vein obstruction.   *See table(s) above for measurements and observations. Electronically signed by Lonni Gaskins MD on 05/09/2024 at 7:26:15 AM.    Final    DG Chest 2 View Result Date: 05/08/2024 CLINICAL DATA:  Preop. Fall last week with right foot ankle fracture. EXAM: CHEST - 2 VIEW COMPARISON:  03/04/2022 FINDINGS: Lungs are somewhat hypoinflated with minimal elevation of the right hemidiaphragm. There is bibasilar linear density compatible with atelectasis. No effusion or pneumothorax. Cardiomediastinal silhouette and remainder of the exam is unchanged. IMPRESSION: Hypoinflation with bibasilar linear atelectasis. Electronically Signed   By: Toribio Agreste M.D.   On: 05/08/2024 14:20   CT ANKLE RIGHT WO CONTRAST Result Date: 05/07/2024 CLINICAL DATA:  Right ankle fracture EXAM: CT OF THE RIGHT ANKLE WITHOUT CONTRAST TECHNIQUE: Multidetector CT imaging of the right ankle was performed according to the standard protocol. Multiplanar CT image reconstructions were also generated. RADIATION DOSE REDUCTION: This exam was performed according to the departmental dose-optimization program which  includes automated exposure control, adjustment of the mA and/or kV according to patient size and/or use of iterative reconstruction technique. COMPARISON:  X-ray 05/02/2024 FINDINGS: Bones/Joint/Cartilage Acute avulsion fracture of the anterolateral aspect of the tibial plafond with 6 mm of fracture distraction. Nondisplaced vertical fracture along the periphery of the posterior malleolus. Minimally displaced fracture of the tip of the medial malleolus. Ankle mortise is aligned without dislocation. Distal fibula intact without fracture. Tiny cortical avulsion fragments along the lateral margin of the talus, likely secondary to ATFL avulsion injury. Small bony fragment along the lateral margin of the calcaneal body which could reflect calcaneofibular avulsion fragment. Additional fractures centered at the tarsometatarsal joints of the foot including avulsion fracture of the anterolateral margin of the medial cuneiform. Nondisplaced fractures of the second and third metatarsal bases. No TMT joint malalignment. Ligaments Fracture pattern indicative of multi ligamentous injury. Muscles and Tendons No acute musculotendinous abnormality by CT. Soft tissues Soft tissue swelling of the foot and ankle. No organized hematoma. No soft tissue air to suggest open fracture. IMPRESSION: 1. Acute bimalleolar fracture of the right ankle, as described above. 2. Additional fractures centered at the first and second tarsometatarsal joints of the foot indicative of a Lisfranc ligament injury. No TMT joint malalignment. 3. Tiny cortical avulsion fragments along the lateral margin of the talus, likely secondary to ATFL avulsion injury. 4. Small bony fragment along the lateral margin of the calcaneal body which could reflect calcaneofibular avulsion fragment. Electronically Signed   By: Mabel Converse D.O.   On: 05/07/2024 11:13   DG Ankle 2 Views Right Result Date: 05/02/2024 EXAM: 2 VIEW(S) XRAY OF THE ANKLE 05/02/2024 11:30:00 PM  CLINICAL HISTORY: fall. Fell after stepping down, painful around ankle region w/swelling mediolaterally COMPARISON: None available. FINDINGS: BONES AND JOINTS: No acute fracture. No focal osseous lesion. No joint dislocation. Plantar calcaneal spur. SOFT TISSUES: Mild soft tissue swelling, most prominent laterally. IMPRESSION: 1. No acute osseous abnormality. 2. Mild soft tissue swelling. Electronically signed by: Pinkie Pebbles MD 05/02/2024 11:41 PM EDT RP Workstation: [id number]      Discharge Exam: Vitals:   05/15/24 0356 05/15/24 0800  BP: 127/78 (!) 124/59  Pulse: 78 80  Resp: 16 17  Temp: 97.6 F (36.4 C) 98 F (36.7 C)  SpO2: 99% 98%    General: Pt is alert, awake, not in acute distress Cardiovascular: RRR, S1/S2 +, no edema Respiratory: CTA bilaterally, no wheezing, no rhonchi, no respiratory distress, no conversational dyspnea  Abdominal: Soft, NT, ND, bowel sounds + Extremities: no edema, no cyanosis, RLE in splint  Psych: Normal mood and affect, stable judgement and insight     The results of significant diagnostics from this hospitalization (including imaging, microbiology, ancillary and laboratory) are listed below for reference.     Microbiology: Recent Results (from the past 240 hours)  Surgical pcr screen     Status: None   Collection Time: 05/09/24  9:32 AM   Specimen: Nasal Mucosa; Nasal Swab  Result Value Ref Range Status   MRSA, PCR NEGATIVE NEGATIVE Final   Staphylococcus aureus NEGATIVE NEGATIVE Final    Comment: (NOTE) The Xpert SA Assay (FDA approved for NASAL specimens in patients 9 years of age and older), is one component of a comprehensive surveillance program. It is not intended to diagnose infection nor to guide or monitor treatment. Performed at [facility], [address] [place], [place] [address]      Labs: BNP (last 3 results) No results for input(s): BNP in the last 8760 hours. Basic Metabolic Panel: Recent Labs  Lab  05/08/24 1310 05/09/24 0144 05/10/24 0504 05/11/24 0535 05/12/24 0819  NA 140 139 137 139 138  K 4.5 4.2 5.0 4.5 4.5  CL 104 107 104 105 103  CO2 23 21* 24 20* 24  GLUCOSE 108* 127* 91 108* 93  BUN 49* 46* 32* 29* 30*  CREATININE 1.37* 1.64* 1.28* 1.03* 1.07*  CALCIUM  9.3 8.5* 8.7* 8.8* 9.1   Liver Function Tests: No results for input(s): AST, ALT, ALKPHOS, BILITOT, PROT, ALBUMIN in the last 168 hours. No results for input(s): LIPASE, AMYLASE in the last 168 hours. No results for input(s): AMMONIA in the last 168 hours. CBC: Recent Labs  Lab 05/08/24 1310 05/09/24 0144 05/10/24 0504 05/11/24 0535  WBC 7.4 6.2 7.6 7.6  NEUTROABS 5.2  --   --   --   HGB 11.1* 9.0* 9.7* 9.4*  HCT 33.4* 28.0* 30.1* 28.3*  MCV 93.3 95.6 95.9 93.7  PLT 305 274 243 242   Cardiac Enzymes: No results for input(s): CKTOTAL, CKMB, CKMBINDEX, TROPONINI in the last 168 hours. BNP: Invalid input(s): POCBNP CBG: No results for input(s): GLUCAP in the last 168 hours. D-Dimer No results for input(s): DDIMER in the last 72 hours. Hgb A1c No results for input(s): HGBA1C in the last 72 hours. Lipid Profile No results for input(s): CHOL, HDL, LDLCALC, TRIG, CHOLHDL, LDLDIRECT in the last 72 hours. Thyroid function studies No results for input(s): TSH, T4TOTAL, T3FREE, THYROIDAB in the last 72 hours.  Invalid input(s): FREET3 Anemia work up No results for input(s): VITAMINB12, FOLATE, FERRITIN, TIBC, IRON, RETICCTPCT in the last 72 hours. Urinalysis    Component Value Date/Time   COLORURINE YELLOW 07/01/2013 1307   APPEARANCEUR CLEAR 07/01/2013 1307   LABSPEC 1.023 07/01/2013 1307   PHURINE 6.5 07/01/2013 1307   GLUCOSEU NEGATIVE 07/01/2013 1307   HGBUR NEGATIVE 07/01/2013 1307   BILIRUBINUR Negative 09/15/2021 1150   KETONESUR NEGATIVE 07/01/2013 1307   PROTEINUR Negative 09/15/2021 1150   PROTEINUR NEGATIVE 07/01/2013 1307    UROBILINOGEN 0.2 09/15/2021 1150   UROBILINOGEN 0.2 07/01/2013 1307   NITRITE Negative 09/15/2021 1150   NITRITE NEGATIVE 07/01/2013 1307   LEUKOCYTESUR Negative 09/15/2021 1150   Sepsis Labs Recent Labs  Lab 05/08/24 1310 05/09/24 0144 05/10/24 0504 05/11/24 0535  WBC 7.4 6.2 7.6 7.6   Microbiology Recent Results (from the past 240 hours)  Surgical pcr screen     Status: None   Collection Time: 05/09/24  9:32 AM   Specimen: Nasal Mucosa; Nasal Swab  Result Value Ref Range Status   MRSA, PCR NEGATIVE NEGATIVE Final   Staphylococcus aureus NEGATIVE NEGATIVE Final    Comment: (NOTE) The Xpert SA Assay (FDA approved for NASAL specimens in patients 68 years of age and older), is one component of a comprehensive surveillance program. It is not intended to diagnose infection nor to guide or monitor treatment. Performed at [facility], [address], [place], [place] [address]      Patient was seen and examined on the day of discharge and was found to be in stable condition. Time coordinating discharge: 35 minutes including assessment and coordination of care, as well as examination of the patient.   SIGNED:  Delon Hoe, DO [facility] 05/15/2024, 9:37 AM

## 2024-05-23 ENCOUNTER — Ambulatory Visit: Payer: Self-pay | Admitting: Internal Medicine

## 2024-05-23 ENCOUNTER — Encounter: Payer: Self-pay | Admitting: Neurology

## 2024-05-24 NOTE — Telephone Encounter (Signed)
 Thanks

## 2024-05-28 ENCOUNTER — Encounter: Payer: Self-pay | Admitting: Family Medicine

## 2024-05-28 ENCOUNTER — Other Ambulatory Visit: Payer: Self-pay | Admitting: Neurology

## 2024-05-28 DIAGNOSIS — Z5320 Procedure and treatment not carried out because of patient's decision for unspecified reasons: Secondary | ICD-10-CM

## 2024-05-28 NOTE — Telephone Encounter (Signed)
 Requested Prescriptions   Pending Prescriptions Disp Refills   PHENobarbital  (LUMINAL) 64.8 MG tablet [Pharmacy Med Name: PHENOBARBITAL  64.8MG  TABLETS] 180 tablet     Sig: TAKE 2 TABLETS(129.6 MG) BY MOUTH AT BEDTIME   Last seen 12/14/23 Next appt scheduled 07/17/24 Dispenses   Dispensed Days Supply Quantity Provider Pharmacy  PHENOBARBITAL  64.8MG  TABLETS 03/14/2024 90 180 each Onita Duos, MD [facility]  PHENOBARBITAL  64.8MG  TABLETS 12/14/2023 90 180 each Onita Duos, MD [facility]  PHENOBARBITAL  64.8MG  TABLETS 11/11/2023 33 67 each Onita Duos, MD [facility]  PHENOBARBITAL  64.8MG  TABLETS 11/03/2023 5 8 each Dohmeier, Dedra, MD [facility]  PHENOBARBITAL  64.8MG  TABLETS 08/01/2023 90 135 each Dohmeier, Dedra, MD [facility][email or web address]

## 2024-05-29 ENCOUNTER — Telehealth: Payer: Self-pay | Admitting: Pharmacy Technician

## 2024-05-29 ENCOUNTER — Other Ambulatory Visit (HOSPITAL_COMMUNITY): Payer: Self-pay

## 2024-05-29 NOTE — Telephone Encounter (Signed)
     Xarelto  denied [employer] 2026

## 2024-05-31 NOTE — Telephone Encounter (Signed)
 Patient does not have insurance. She will give us  a call the first of jan and give us  how much she had made in 2025 and we can send for reconsideration

## 2024-06-05 ENCOUNTER — Ambulatory Visit: Payer: Self-pay | Admitting: Neurology

## 2024-06-07 NOTE — Telephone Encounter (Signed)
 Hi, the patient said she called [employer] and they told her that they would need a letter with your office letterhead on it stating that patient doesn't work and has limited income and have you sign and date it. She said she has been in rehab currently and has been out of work since August. 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 Would that be something you would be comfortable doing? She is currently approved until 08/08/24 and this letter if you write it she said should get her approved for 2026 since currently they denied her due to income that they believe she made.

## 2024-06-15 ENCOUNTER — Encounter: Payer: Self-pay | Admitting: Internal Medicine

## 2024-06-15 NOTE — Telephone Encounter (Signed)
 Faxed letter to [employer]

## 2024-06-15 NOTE — Progress Notes (Signed)
 letter

## 2024-06-20 NOTE — Telephone Encounter (Signed)
 Re-faxed attestation letter with income amount and hoh size   Emailed patient xarelto  application -she will fill out and drop off, mail in or email me back

## 2024-06-22 ENCOUNTER — Encounter: Payer: Self-pay | Admitting: Family Medicine

## 2024-06-25 NOTE — Telephone Encounter (Signed)
 Faxed income to [employer] with note to use for 2026

## 2024-06-25 NOTE — Telephone Encounter (Signed)
 I called [employer] and they said they need the 2024 1040 taxes again with a note saying use for 2026 since she won't have taxes done until feb. The taxes were not in media. I called the patient and she will send me the taxes   [employer] said they shouldn't need anything else but the patient has the application in case they need her to fill that out again

## 2024-06-28 NOTE — Telephone Encounter (Signed)
 I called [employer] and they said they received the letter and will forward to dept for review

## 2024-07-02 ENCOUNTER — Encounter: Payer: Self-pay | Admitting: Neurology

## 2024-07-02 NOTE — Telephone Encounter (Signed)
 Please offer any of the following: January 6, 7, 8 at 8:30 AM.

## 2024-07-02 NOTE — Telephone Encounter (Signed)
 Pt has accepted Jan 7th.

## 2024-07-02 NOTE — Telephone Encounter (Signed)
 Thanks, Dr Chalice just FYI

## 2024-07-17 ENCOUNTER — Ambulatory Visit: Payer: Self-pay | Admitting: Neurology

## 2024-07-17 NOTE — Telephone Encounter (Signed)
 I called [employer] and they said she last got shipment on 06/24/24. She will be reviewed after 08/09/24 since her next fill isnt until feb.

## 2024-08-01 ENCOUNTER — Other Ambulatory Visit: Payer: Self-pay | Admitting: Family Medicine

## 2024-08-13 ENCOUNTER — Encounter: Payer: Self-pay | Admitting: Family Medicine

## 2024-08-15 ENCOUNTER — Ambulatory Visit (INDEPENDENT_AMBULATORY_CARE_PROVIDER_SITE_OTHER): Payer: Self-pay | Admitting: Neurology

## 2024-08-15 ENCOUNTER — Encounter: Payer: Self-pay | Admitting: Family Medicine

## 2024-08-15 ENCOUNTER — Ambulatory Visit (INDEPENDENT_AMBULATORY_CARE_PROVIDER_SITE_OTHER): Payer: Self-pay | Admitting: Family Medicine

## 2024-08-15 ENCOUNTER — Encounter: Payer: Self-pay | Admitting: Neurology

## 2024-08-15 VITALS — BP 139/86 | HR 78 | Ht 64.0 in | Wt 202.0 lb

## 2024-08-15 VITALS — BP 130/78 | HR 123 | Temp 98.1°F | Ht 64.0 in | Wt 202.0 lb

## 2024-08-15 DIAGNOSIS — E559 Vitamin D deficiency, unspecified: Secondary | ICD-10-CM

## 2024-08-15 DIAGNOSIS — R471 Dysarthria and anarthria: Secondary | ICD-10-CM | POA: Insufficient documentation

## 2024-08-15 DIAGNOSIS — E782 Mixed hyperlipidemia: Secondary | ICD-10-CM | POA: Diagnosis not present

## 2024-08-15 DIAGNOSIS — I10 Essential (primary) hypertension: Secondary | ICD-10-CM | POA: Diagnosis not present

## 2024-08-15 DIAGNOSIS — F332 Major depressive disorder, recurrent severe without psychotic features: Secondary | ICD-10-CM | POA: Diagnosis not present

## 2024-08-15 DIAGNOSIS — R29898 Other symptoms and signs involving the musculoskeletal system: Secondary | ICD-10-CM | POA: Insufficient documentation

## 2024-08-15 DIAGNOSIS — M21372 Foot drop, left foot: Secondary | ICD-10-CM | POA: Insufficient documentation

## 2024-08-15 DIAGNOSIS — R4589 Other symptoms and signs involving emotional state: Secondary | ICD-10-CM | POA: Insufficient documentation

## 2024-08-15 DIAGNOSIS — F339 Major depressive disorder, recurrent, unspecified: Secondary | ICD-10-CM | POA: Diagnosis not present

## 2024-08-15 DIAGNOSIS — G40909 Epilepsy, unspecified, not intractable, without status epilepticus: Secondary | ICD-10-CM | POA: Diagnosis not present

## 2024-08-15 DIAGNOSIS — Z7689 Persons encountering health services in other specified circumstances: Secondary | ICD-10-CM | POA: Insufficient documentation

## 2024-08-15 DIAGNOSIS — G720 Drug-induced myopathy: Secondary | ICD-10-CM

## 2024-08-15 DIAGNOSIS — Z7901 Long term (current) use of anticoagulants: Secondary | ICD-10-CM

## 2024-08-15 LAB — COMPREHENSIVE METABOLIC PANEL WITH GFR
ALT: 27 U/L (ref 3–35)
AST: 23 U/L (ref 5–37)
Albumin: 4.2 g/dL (ref 3.5–5.2)
Alkaline Phosphatase: 117 U/L (ref 39–117)
BUN: 22 mg/dL (ref 6–23)
CO2: 28 meq/L (ref 19–32)
Calcium: 9.3 mg/dL (ref 8.4–10.5)
Chloride: 98 meq/L (ref 96–112)
Creatinine, Ser: 1.02 mg/dL (ref 0.40–1.20)
GFR: 57.89 mL/min — ABNORMAL LOW
Glucose, Bld: 138 mg/dL — ABNORMAL HIGH (ref 70–99)
Potassium: 3.5 meq/L (ref 3.5–5.1)
Sodium: 135 meq/L (ref 135–145)
Total Bilirubin: 0.2 mg/dL (ref 0.2–1.2)
Total Protein: 8.1 g/dL (ref 6.0–8.3)

## 2024-08-15 LAB — VITAMIN B12: Vitamin B-12: 550 pg/mL (ref 211–911)

## 2024-08-15 LAB — CBC WITH DIFFERENTIAL/PLATELET
Basophils Absolute: 0 K/uL (ref 0.0–0.1)
Basophils Relative: 0.5 % (ref 0.0–3.0)
Eosinophils Absolute: 0 K/uL (ref 0.0–0.7)
Eosinophils Relative: 0.4 % (ref 0.0–5.0)
HCT: 35.4 % — ABNORMAL LOW (ref 36.0–46.0)
Hemoglobin: 11.9 g/dL — ABNORMAL LOW (ref 12.0–15.0)
Lymphocytes Relative: 16 % (ref 12.0–46.0)
Lymphs Abs: 1.5 K/uL (ref 0.7–4.0)
MCHC: 33.8 g/dL (ref 30.0–36.0)
MCV: 91.3 fl (ref 78.0–100.0)
Monocytes Absolute: 0.6 K/uL (ref 0.1–1.0)
Monocytes Relative: 6 % (ref 3.0–12.0)
Neutro Abs: 7.1 K/uL (ref 1.4–7.7)
Neutrophils Relative %: 77.1 % — ABNORMAL HIGH (ref 43.0–77.0)
Platelets: 323 K/uL (ref 150.0–400.0)
RBC: 3.87 Mil/uL (ref 3.87–5.11)
RDW: 13.4 % (ref 11.5–15.5)
WBC: 9.2 K/uL (ref 4.0–10.5)

## 2024-08-15 LAB — LIPID PANEL
Cholesterol: 231 mg/dL — ABNORMAL HIGH (ref 28–200)
HDL: 69.5 mg/dL
LDL Cholesterol: 146 mg/dL — ABNORMAL HIGH (ref 10–99)
NonHDL: 161.07
Total CHOL/HDL Ratio: 3
Triglycerides: 74 mg/dL (ref 10.0–149.0)
VLDL: 14.8 mg/dL (ref 0.0–40.0)

## 2024-08-15 LAB — VITAMIN D 25 HYDROXY (VIT D DEFICIENCY, FRACTURES): VITD: 21.32 ng/mL — ABNORMAL LOW (ref 30.00–100.00)

## 2024-08-15 LAB — TSH: TSH: 0.46 u[IU]/mL (ref 0.35–5.50)

## 2024-08-15 NOTE — Progress Notes (Signed)
 "        Provider:  Dedra Gores, MD  Primary Care Physician:  Sarah Lavern CROME, MD [address] [place] [address]     Referring Provider: Jodie Lavern CROME, Md [address] [place],  [place] [address]          Chief Complaint according to patient   Patient presents with:           Seizure patient  with years of seizure freedom. Now  seen for gait disorder, worsening post surgery ? Or just delayed recovery, severely depressed and anxious.       HISTORY OF PRESENT ILLNESS:  Sarah Chung is a 64 y.o. female patient who is here for revisit 08/15/2024 for foot drop : worse on the left and not assessed on the right. Had been 8 weeks at [facility].  Tends to get dehydrated.  Presents with  a speech change, a heavy tongue.  Chief concern according to patient :    Had back surgery in 05-09-2024, pre op labs show a severely elevated BUN.  Elevated creatinine,  and anemia. She has urinary bladder dysfunction, overflow.  Right foot in a cast. No dysphagia for liquids or solids.  Plantar flexion and sensory in feet is preserved.  Rippling sensation in the thighs, she describes this as fasciculations and has a renewed fear of ALS.    I reviewed EMG/ NCV and  MRI cervical spine, thoracic and lumbar images.      Dr Georgianne note : 12-30-2023,  PMHx of  HLD HTN PE in 2020, on Xalreto Seizure since 1987, last seizure was 15 years ago, on phenobarbitol.     She used to be very active, works as a optician, dispensing, suffered a fall in the summer 2024, fell face down on the concrete floor, since then she began to notice mild gait abnormality, seems to be involving left leg more, after prolonged standing or walking her left leg tends to give out underneath her   She was able to go on a [employer] trip in fall 2024, walked 250,000 steps in 6 days, but by December 2024 she noticed significant gait abnormality, continue to progress   She was able to finish first 2 seizure and  performance in 2025, but relied on her cane in January, that had to sit down, and rely on her coworker holding her during most perform season in April 2025   She has a long history of urinary urgency, denies significant worsening, denies upper extremity symptoms   But has lower thoracic spine pain, also intermittent muscle spasm involving lower thoracic region  History: 64 year old female complains of progressive worsening distal weakness gait abnormality   Summary of the test: Nerve conduction study: Bilateral sural, superficial peroneal sensory responses were normal. Bilateral tibial motor responses were absent. Bilateral peroneal to EDB motor response showed significantly decreased CMAP amplitude   Right median, ulnar motor responses were normal     Electromyography: Selected needle examination was performed at bilateral lower extremity muscles, lumbosacral paraspinal muscles right upper extremity muscles.   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  There is also evidence of active denervation at bilateral lower lumbar paraspinal muscles, most obvious at the left side.     Conclusion: This is an abnormal study.  There is electrodiagnostic evidence of active bilateral lumbosacral radiculopathy, involving bilateral L4-5 S1 myotomes.       ------------------------------- [name]. M.D. Ph.D.  Review of Systems: Out of a complete 14 system review, the patient complains of only the following symptoms, and all other reviewed systems are negative.:       Social History   Socioeconomic History   Marital status: Divorced    Spouse name: [name]   Number of children: 2   Years of education: College   Highest education level: Bachelor's degree (e.g., BA, AB, BS)  Occupational History   Occupation: Musician: [employer]  Tobacco Use   Smoking status: Never   Smokeless tobacco: Never  Vaping Use    Vaping status: Never Used  Substance and Sexual Activity   Alcohol use: No   Drug use: No   Sexual activity: Yes    Birth control/protection: Post-menopausal  Other Topics Concern   Not on file  Social History Narrative   Patient is married ([name]) and lives at home with his husband.   Patient has two adult children.   Patient is working full-time.   Patient has a college education.   Patient is right-handed.   Patient drinks two cups of coffee and some soda.   Social Drivers of Health   Tobacco Use: Low Risk (08/15/2024)   Patient History    Smoking Tobacco Use: Never    Smokeless Tobacco Use: Never    Passive Exposure: Not on file  Financial Resource Strain: Low Risk (08/13/2024)   Overall Financial Resource Strain (CARDIA)    Difficulty of Paying Living Expenses: Not very hard  Food Insecurity: No Food Insecurity (08/13/2024)   Epic    Worried About Programme Researcher, Broadcasting/film/video in the Last Year: Never true    Ran Out of Food in the Last Year: Never true  Transportation Needs: No Transportation Needs (08/13/2024)   Epic    Lack of Transportation (Medical): No    Lack of Transportation (Non-Medical): No  Physical Activity: Inactive (08/13/2024)   Exercise Vital Sign    Days of Exercise per Week: 0 days    Minutes of Exercise per Session: Not on file  Stress: Stress Concern Present (08/13/2024)   [employer] of Occupational Health - Occupational Stress Questionnaire    Feeling of Stress: To some extent  Social Connections: Unknown (08/13/2024)   Social Connection and Isolation Panel    Frequency of Communication with Friends and Family: More than three times a week    Frequency of Social Gatherings with Friends and Family: More than three times a week    Attends Religious Services: More than 4 times per year    Active Member of Clubs or Organizations: Patient declined    Attends Banker Meetings: Not on file    Marital Status: Divorced  Depression (PHQ2-9): Low Risk  (04/05/2024)   Depression (PHQ2-9)    PHQ-2 Score: 0  Alcohol Screen: Not on file  Housing: Low Risk (08/13/2024)   Epic    Unable to Pay for Housing in the Last Year: No    Number of Times Moved in the Last Year: 0    Homeless in the Last Year: No  Utilities: Patient Declined (05/08/2024)   Epic    Threatened with loss of utilities: Patient declined  Health Literacy: Not on file    Family History  Problem Relation Age of Onset   Heart attack Father    Parkinson's disease Father    Heart disease Father    Heart disease Brother    Heart attack Brother    Mental illness  Mother    Yvone' disease Mother    Depression Sister    Kidney disease Maternal Grandfather    Heart attack Paternal Grandmother     Past Medical History:  Diagnosis Date   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   Allergy    Anxiety    Calculus of gallbladder with acute cholecystitis, without mention of obstruction 07/03/2013   Clotting disorder    Coronary artery disease    Depression    DVT (deep venous thrombosis) (HCC)    LLE post-c-section 1993, unprovoked RLE 02/2019   Gallstones    Hyperlipidemia    Hypertension    Pulmonary embolism (HCC) 02/08/2019   Seizures (HCC)     Past Surgical History:  Procedure Laterality Date   CHOLECYSTECTOMY N/A 07/02/2013   Procedure: LAPAROSCOPIC CHOLECYSTECTOMY WITH INTRAOPERATIVE CHOLANGIOGRAM;  Surgeon: Elon CHRISTELLA Pacini, MD;  Location: [facility];  Service: General;  Laterality: N/A;   IR IVC FILTER PLMT / S&I PORTER GUID/MOD SED  02/13/2024   IR IVC FILTER RETRIEVAL / S&I PORTER GUID/MOD SED  03/20/2024   IR RADIOLOGIST EVAL & MGMT  02/02/2024   IR RADIOLOGIST EVAL & MGMT  03/14/2024   LEFT HEART CATH AND CORONARY ANGIOGRAPHY N/A 04/20/2019   Procedure: LEFT HEART CATH AND CORONARY ANGIOGRAPHY;  Surgeon: Claudene Victory ORN, MD;  Location: [facility];  Service: Cardiovascular;  Laterality: N/A;   LUMBAR LAMINECTOMY/DECOMPRESSION MICRODISCECTOMY N/A 02/24/2024   Procedure:  Laminectomy and Foraminotomy - Lumbar Four-Lumbar Five - Lumbar Five-Sacral One;  Surgeon: Joshua Alm Hamilton, MD;  Location: [facility] OR;  Service: Neurosurgery;  Laterality: N/A;  Laminectomy and Foraminotomy  - L4-L5 - L5-S1   OPEN REDUCTION INTERNAL FIXATION (ORIF) FOOT LISFRANC FRACTURE Right 05/09/2024   Procedure: OPEN REDUCTION INTERNAL FIXATION (ORIF) FOOT LISFRANC FRACTURE;  Surgeon: Barton Drape, MD;  Location: [facility] OR;  Service: Orthopedics;  Laterality: Right;   ORIF ANKLE FRACTURE Right 05/09/2024   Procedure: OPEN REDUCTION INTERNAL FIXATION (ORIF) ANKLE FRACTURE;  Surgeon: Barton Drape, MD;  Location: [facility] OR;  Service: Orthopedics;  Laterality: Right;  ORIF Right ankle and midfoot fracture     Medications Ordered Prior to Encounter[1]  Allergies[2]   DIAGNOSTIC DATA (LABS, IMAGING, TESTING) - I reviewed patient records, labs, notes, testing and imaging myself where available.  Lab Results  Component Value Date   WBC 7.6 05/11/2024   HGB 9.4 (L) 05/11/2024   HCT 28.3 (L) 05/11/2024   MCV 93.7 05/11/2024   PLT 242 05/11/2024      Component Value Date/Time   NA 138 05/12/2024 0819   NA 140 01/17/2024 0753   K 4.5 05/12/2024 0819   CL 103 05/12/2024 0819   CO2 24 05/12/2024 0819   GLUCOSE 93 05/12/2024 0819   BUN 30 (H) 05/12/2024 0819   BUN 13 01/17/2024 0753   CREATININE 1.07 (H) 05/12/2024 0819   CREATININE 0.96 06/24/2020 1353   CALCIUM  9.1 05/12/2024 0819   PROT 7.7 01/17/2024 0753   ALBUMIN 4.3 01/17/2024 0753   AST 25 01/17/2024 0753   ALT 26 01/17/2024 0753   ALKPHOS 107 01/17/2024 0753   BILITOT 0.3 01/17/2024 0753   GFRNONAA 58 (L) 05/12/2024 0819   GFRAA >60 08/16/2019 1855   Lab Results  Component Value Date   CHOL 163 06/23/2022   HDL 67 03/11/2023   LDLCALC 83 06/23/2022   TRIG 71 03/11/2023   CHOLHDL 3 06/23/2022   Lab Results  Component Value Date   HGBA1C 5.7 06/24/2023   No results  found for: VITAMINB12 Lab Results  Component  Value Date   TSH 0.60 10/20/2023    PHYSICAL EXAM:  Vitals:   08/15/24 0831  BP: 139/86  Pulse: 78   No data found. Body mass index is 34.67 kg/m.   Wt Readings from Last 3 Encounters:  08/15/24 202 lb (91.6 kg)  05/09/24 203 lb 0.7 oz (92.1 kg)  05/02/24 203 lb 0.7 oz (92.1 kg)     Ht Readings from Last 3 Encounters:  08/15/24 5' 4 (1.626 m)  05/09/24 5' 4 (1.626 m)  05/02/24 5' 4 (1.626 m)      General: The patient is awake, alert and appears not in acute distress and groomed. Head: Normocephalic, atraumatic.  Neck is supple. General: The patient is awake, alert and appears not in acute distress. The patient is well groomed. Head: Normocephalic, atraumatic. Neck is supple.  Cardiovascular:  Regular rate and cardiac rhythm by pulse,  without distended neck veins. Respiratory: Lungs are clear to auscultation.  Skin:  Without evidence of ankle edema, or rash. Trunk: obese    NEUROLOGIC EXAM: The patient is awake and alert, oriented to place and time.   Memory subjective described as intact.  Attention span & concentration ability appears normal.  Speech is fluent,  with intermittent bulbar speech, which worsens when she starts crying.  No aphasia.  Mood and affect are DEPRESSED  Cranial nerves: no loss of smell or taste reported   Pupils are equal and briskly reactive to light. Funduscopic exam defered.  Extraocular movements in vertical and horizontal planes were intact and without nystagmus. No Diplopia. Visual fields by finger perimetry are intact. Hearing was intact to soft voice and finger rubbing.    Facial sensation intact to fine touch.  Facial motor strength is symmetric and tongue and uvula move midline.  Neck ROM : rotation, tilt and flexion extension were normal for age and shoulder shrug was symmetrical.    Motor exam:   unable to lift foot at the ankle but now able to  push  down,  the left foot is cold.   Right foot has trace movement. Normal  sensation in the feet for pinprick and vibrations.      Coordination: Rapid alternating movements in the fingers/hands were of normal speed.  The Finger-to-nose maneuver was intact without evidence of ataxia, dysmetria or tremor.   Gait and station: Patient could rise only with assistance , has still significant  hip flexor weakness,  truncal core weakness and foot drop.    Deep tendon reflexes: in the  upper and lower extremities are symmetric and Brisk. .       ASSESSMENT AND PLAN :   64 y.o. year old female  here to follow p from rehab post back surgery, with:    1)persistent  foot drop on the left,  fractured ankle in the right since a fall in October.   2) reports ants crawling skin sensation, worse when poorly hydrated.   3)  stammering speech and some bulbar changes reported by her friends , tearful affect.  Anxiety about health. She is worried about ALS. Had EMG and NCV with dr onita , negative for ALS.SABRA   PLAN : continue all exercises,  we need to encourage and enforce hydration,  check on anemia, electrolytes.   Address depression and  crippling anxiety.  I will leave that to her PCP , consider referral to counseling.  Speech therapy new order, and PT to continue  RV in 5 months - I do not believe we are dealing with a neuromotor disorder.   I would like to thank Sarah Lavern CROME, MD , allowing me to meet with this pleasant patient.   The referring provider will be notified of the test results.   The patient's condition requires frequent monitoring and adjustments in the treatment plan, reflecting the ongoing complexity of care.  This provider is the continuing focal point for all needed services for this condition.  After spending a total time of  35  minutes face to face and time for  history taking, physical and neurologic examination, review of laboratory studies,  personal review of imaging studies, reports and results of other testing and review of referral  information / records as far as provided in visit,   Electronically signed by: Dedra Gores, MD 08/15/2024 8:46 AM  [facility] Board certified by The [employer] of Sleep Medicine and Diplomate of the [employer] of Sleep Medicine. Board certified In Neurology through the ABPN, Fellow of the [employer] of Neu[employer].      [1]  Current Outpatient Medications on File Prior to Visit  Medication Sig Dispense Refill   methocarbamol  (ROBAXIN ) 500 MG tablet Take 1 tablet (500 mg total) by mouth every 8 (eight) hours as needed for muscle spasms. 60 tablet 1   olmesartan -hydrochlorothiazide  (BENICAR  HCT) 40-25 MG tablet TAKE 1 TABLET BY MOUTH EVERY DAY 90 tablet 3   ondansetron  (ZOFRAN -ODT) 8 MG disintegrating tablet Take 1 tablet (8 mg total) by mouth every 8 (eight) hours as needed for nausea or vomiting. 30 tablet 0   PHENobarbital  (LUMINAL) 64.8 MG tablet TAKE 2 TABLETS(129.6 MG) BY MOUTH AT BEDTIME 90 tablet 3   XARELTO  20 MG TABS tablet TAKE 1 TABLET(20 MG) BY MOUTH DAILY WITH SUPPER 30 tablet 3   No current facility-administered medications on file prior to visit.  [2]  Allergies Allergen Reactions   Carbamazepine Hives and Rash   Ace Inhibitors Cough   Cephalexin     Other Reaction(s): feel crazy   Gadolinium Derivatives     unknown   Iodinated Contrast Media Nausea Only   Ketorolac     Other Reaction(s): unknown   Ketorolac Tromethamine     unknown   Levetiracetam     Feels outside her body.SABRAloopy  Other Reaction(s): feels outside her body (loopy)   Lisinopril      Other Reaction(s): cough   Lisinopril -Hydrochlorothiazide  Cough   Sulfa Antibiotics Hives and Rash   "

## 2024-08-15 NOTE — Patient Instructions (Addendum)
 Goal is building muscle.    64 y.o. year old female  here to follow up from rehab post back surgery, with:    1)persistent  foot drop on the left,  fractured ankle in the right since a fall in October.   2) reports ants crawling skin sensation, worse when poorly hydrated.   3)  stammering speech and some bulbar changes reported by her friends , tearful affect.  Anxiety about health. She is worried about ALS. Had EMG and NCV with dr onita , negative for ALS.SABRA   PLAN : continue all exercises,  we need to encourage and enforce hydration,  check on anemia, electrolytes.   Address depression and  crippling anxiety. I will leave that to her PCP , consider referral to counseling.  Speech therapy new order, and PT to continue      I would like to thank Jodie Lavern CROME, MD , allowing me to meet with this pleasant patient.   The referring provider will be notified of the test results.

## 2024-08-15 NOTE — Progress Notes (Signed)
 "  Subjective  CC:  Chief Complaint  Patient presents with   Annual Exam    Pt here for Annual Exam an dis not currently fasting    Hypertension    HPI: Sarah Chung is a 64 y.o. female who presents to the office today to address the problems listed above in the chief complaint. Hypertension f/u:  Discussed the use of AI scribe software for clinical note transcription with the patient, who gave verbal consent to proceed.  History of Present Illness Sarah Chung is a 64 year old female who presents for an annual physical exam and evaluation of dehydration and anxiety. However, last visit in April 2025 and isn't doing well so we shifted into chronic and acute problem visit.  I reviewed multiple records.   Dehydration symptoms and laboratory findings - Sensation of 'ripples like ants' on the skin - BUN levels over the past year ranging from 20 to 49 - Insufficient fluid intake, averaging approximately 60 ounces per day - pt and her friend are concerned about chronic dehydration and it's effects.  They are both concerned that the above effects are related to dehydration.  Patient endorses that she does not drink more than 1 to 2 glasses of water per day.  She denies nausea vomiting or diarrhea.  I reviewed recent notes from neurology already discussed this.  She did have a mildly elevated BUN over the last year.  Renal function is normal to just below normal.  Lower extremity weakness and post-surgical status-she reviewed her history of radiculopathy and weakness and falls.  She had surgery and left leg symptoms improved although she has chronic left foot drop now.  Also broke her right ankle and is in a wheelchair currently and wearing a walking boot.  She has been nonambulatory for over 2 months after surgery.  This long course of illness and recovery has been difficult for her.  She admits she is struggling greatly. However, her  fracture is healing and her left lower extremity strength is  improving.  Anxiety and associated symptoms - Long-standing history of anxiety without formal diagnosis or prior medication use although it is likely that she does have chronic anxiety disorder. Anxiety is currently very exacerbated by health concerns.  She admits to constant worry, constant fear, decreased appetite, poor sleep, feelings of panic, elevated heart rate at times, deconditioned and tired.  Low motivation.  Also struggles with family problems.  Seeing a therapist regularly. - Anxiety contributes to feeling 'worn out,' decreased appetite, and reduced activity - Concern about potential side effects of anxiety medications, has fear of medications overall.  Has never used SSRI, benzodiazepine or other medications. - Desire to feel 'normal' and reduce constant worry  Cardiovascular symptoms - Episodes of elevated heart rate, up to 120 bpm during rehabilitation - Recent heart rate recorded at 78 bpm - No palpitations, chest pain, or shortness of breath - Persistent fatigue without heart-related symptoms  Psychosocial support - Engagement in therapy sessions, primarily discussing children and personal matters - Therapy perceived as helpful, though anxiety is not the main focus of sessions  Seizure disorder: Seizure-free, recent follow-up with neurology reviewed. Hyperlipidemia not on statin due to history of statin myopathy with elevated CK.  Has seen cardiology.  Holding off on cholesterol treatment until above symptoms are improved. History of vitamin D  deficiency.  Not taking oral supplementation, Chronic anticoagulation due to history of pulmonary embolism x 2.  No recent bleeding or side effects. Chronic depression  now given symptoms above.  Assessment  1. Essential hypertension   2. Mixed hyperlipidemia   3. Seizure disorder (HCC)   4. Statin myopathy   5. Vitamin D  deficiency   6. Chronic anticoagulation   7. Major depression, recurrent, chronic   8. Leg heaviness       Plan  Assessment and Plan Assessment & Plan Chronic anxiety and depression, moderate to severe at this time due to adjustment reaction Contributing to fatigue, lack of appetite, and overall malaise. Anxiety exacerbated by uncertainty about health status and fear of underlying conditions. No prior diagnosis or treatment for chronic anxiety. Concerns about medication side effects and preference to avoid medications. Discussed potential benefits of Buspar, a safe and effective anti-anxiety medication, which does not affect neurotransmitters and may be mildly helpful. Emphasized importance of addressing anxiety to improve overall well-being. - Started Buspar twice daily for anxiety.  Discussed this may or may not be enough to help control her symptoms.  Close follow-up recommended - Continue therapy sessions with current therapist. - Encouraged focus on positive health improvements and reduction of health-related anxiety. - Return in 4 to 6 weeks to recheck.  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 Symptoms improved post-surgery with regained muscle strength in the leg. Neurologist evaluation reassuring with no concerning findings for neurologic movement disorder. Residual foot drop may persist, but overall improvement noted.  Dehydration Mild dehydration suspected due to insufficient fluid intake. Symptoms include dry mouth and sensation of 'creepy crawlies' in legs. No severe dehydration indicated by lab results. Dehydration not considered a chronic issue but advised to improve hydration for overall health. - Increase fluid intake to 6-8 glasses of water per day. - Consider magnesium glycinate supplementation for muscle relaxation and sleep improvement.  Recheck vitamin D  levels given paresthesias.  And history of deficiency.  Check B12 levels.  Check CBC.  Check thyroid.  Hypertension is controlled.  Reassured about tachycardia.   Likely related to exertion, anxiety and deconditioning.  Needs to increase fluid intake. General health maintenance Emphasis on maintaining hydration, nutrition, and rest. Encouraged to engage in activities that promote well-being, such as walking and social support. Discussed importance of avoiding excessive soda intake and focusing on water consumption. - Encouraged regular hydration with water or flavored water. - Advised against excessive soda consumption.     Education regarding management of these chronic disease states was given. Management strategies discussed on successive visits include dietary and exercise recommendations, goals of achieving and maintaining IBW, and lifestyle modifications aiming for adequate sleep and minimizing stressors.  Follow up: 4 to 6 weeks  I personally spent a total of 45 minutes in the care of the patient today including preparing to see the patient, getting/reviewing separately obtained history, performing a medically appropriate exam/evaluation, counseling and educating, placing orders, documenting clinical information in the EHR, independently interpreting results, and communicating results.   Orders Placed This Encounter  Procedures   TSH   VITAMIN D  25 Hydroxy (Vit-D Deficiency, Fractures)   CBC with Differential/Platelet   Comprehensive metabolic panel with GFR   Lipid panel   Vitamin B12   No orders of the defined types were placed in this encounter.     BP Readings from Last 3 Encounters:  08/15/24 130/78  08/15/24 139/86  05/15/24 (!) 124/59   Wt Readings from Last 3 Encounters:  08/15/24 202 lb (91.6 kg)  08/15/24 202 lb (91.6 kg)  05/09/24 203 lb 0.7 oz (92.1 kg)  Lab Results  Component Value Date   CHOL 163 06/23/2022   CHOL 141 06/22/2021   CHOL 177 06/24/2020   Lab Results  Component Value Date   HDL 67 03/11/2023   HDL 61.70 06/23/2022   HDL 58.50 06/22/2021   Lab Results  Component Value Date   LDLCALC 83  06/23/2022   LDLCALC 69 06/22/2021   LDLCALC 99 06/24/2020   Lab Results  Component Value Date   TRIG 71 03/11/2023   TRIG 92.0 06/23/2022   TRIG 65.0 06/22/2021   Lab Results  Component Value Date   CHOLHDL 3 06/23/2022   CHOLHDL 2 06/22/2021   CHOLHDL 2.8 06/24/2020   No results found for: LDLDIRECT Lab Results  Component Value Date   CREATININE 1.07 (H) 05/12/2024   BUN 30 (H) 05/12/2024   NA 138 05/12/2024   K 4.5 05/12/2024   CL 103 05/12/2024   CO2 24 05/12/2024    The 10-year ASCVD risk score (Arnett DK, et al., 2019) is: 8.4%   Values used to calculate the score:     Age: 67 years     Clinically relevant sex: Female     Is Non-Hispanic African American: No     Diabetic: No     Tobacco smoker: No     Systolic Blood Pressure: 130 mmHg     Is BP treated: Yes     HDL Cholesterol: 67 mg/dL     Total Cholesterol: 276 mg/dL  I reviewed the patients updated PMH, FH, and SocHx.    Patient Active Problem List   Diagnosis Date Noted   Essential hypertension 09/20/2022    Priority: High   Depression, major, single episode, moderate (HCC) 09/01/2022    Priority: High   Chronic anticoagulation 06/22/2021    Priority: High   Seizure disorder (HCC) 08/27/2020    Priority: High   Mixed hyperlipidemia 06/20/2020    Priority: High   Obesity 06/20/2020    Priority: High   Nonobstructive atherosclerosis of coronary artery 04/20/2019    Priority: High   History of pulmonary embolism 02/09/2019    Priority: High   Colon polyps 06/22/2023    Priority: Medium    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    Priority: Medium    Osteopenia 12/17/2022    Priority: Medium    Need for speech therapy assessment 08/15/2024   Dysarthria 08/15/2024   Anxiety about health 08/15/2024   Abnormal muscle tone 08/15/2024   Left foot drop 08/15/2024   Severe episode of recurrent major depressive disorder, without psychotic features (HCC) 08/15/2024   Ankle fracture, bimalleolar, closed,  right, initial encounter 05/09/2024   Bimalleolar ankle fracture, right, closed, initial encounter 05/08/2024   S/P lumbar laminectomy 02/24/2024   Statin myopathy 11/11/2023   Spinal osteoarthritis 10/20/2023   Vitamin D  deficiency 10/20/2023   Leg cramps 10/20/2023   Falls, subsequent encounter 08/01/2023   Gait difficulty 06/06/2023    Allergies: Carbamazepine, Ace inhibitors, Cephalexin, Gadolinium derivatives, Iodinated contrast media, Ketorolac, Ketorolac tromethamine, Levetiracetam, Lisinopril , Lisinopril -hydrochlorothiazide , and Sulfa antibiotics  Social History: Patient  reports that she has never smoked. She has never used smokeless tobacco. She reports that she does not drink alcohol and does not use drugs.  Active Medications[1]  Review of Systems: Cardiovascular: negative for chest pain, palpitations, leg swelling, orthopnea Respiratory: negative for SOB, wheezing or persistent cough Gastrointestinal: negative for abdominal pain Genitourinary: negative for dysuria or gross hematuria  Objective  Vitals: BP 130/78   Pulse (!) 123, rechecked 90s  Temp 98.1 F (36.7 C)   Ht 5' 4 (1.626 m)   Wt 202 lb (91.6 kg)   SpO2 94%   BMI 34.67 kg/m  General: In wheelchair, crying, anxious appearing, sitting in wheelchair, wearing walking boot on right. Psych:  Alert and oriented, normal mood and affect HEENT:  Normocephalic, atraumatic, supple neck  Cardiovascular: Mild tachycardia with regular rhythm without murmur. no edema Respiratory:  Good breath sounds bilaterally, CTAB with normal respiratory effort Skin:  Warm, no rashes  Commons side effects, risks, benefits, and alternatives for medications and treatment plan prescribed today were discussed, and the patient expressed understanding of the given instructions. Patient is instructed to call or message via MyChart if he/she has any questions or concerns regarding our treatment plan. No barriers to understanding were  identified. We discussed Red Flag symptoms and signs in detail. Patient expressed understanding regarding what to do in case of urgent or emergency type symptoms.  Medication list was reconciled, printed and provided to the patient in AVS. Patient instructions and summary information was reviewed with the patient as documented in the AVS. This note was prepared with assistance of Dragon voice recognition software. Occasional wrong-word or sound-a-like substitutions may have occurred due to the inherent limitation    [1]  Current Meds  Medication Sig   methocarbamol  (ROBAXIN ) 500 MG tablet Take 1 tablet (500 mg total) by mouth every 8 (eight) hours as needed for muscle spasms.   olmesartan -hydrochlorothiazide  (BENICAR  HCT) 40-25 MG tablet TAKE 1 TABLET BY MOUTH EVERY DAY   ondansetron  (ZOFRAN -ODT) 8 MG disintegrating tablet Take 1 tablet (8 mg total) by mouth every 8 (eight) hours as needed for nausea or vomiting.   PHENobarbital  (LUMINAL) 64.8 MG tablet TAKE 2 TABLETS(129.6 MG) BY MOUTH AT BEDTIME   XARELTO  20 MG TABS tablet TAKE 1 TABLET(20 MG) BY MOUTH DAILY WITH SUPPER   "

## 2024-08-16 ENCOUNTER — Telehealth: Payer: Self-pay | Admitting: Family Medicine

## 2024-08-16 ENCOUNTER — Telehealth: Payer: Self-pay

## 2024-08-16 NOTE — Telephone Encounter (Signed)
 Pt called in requesting update on medication Buspar . Pt was advised to start this medication yesterday at her OV. Per pt chart it has not been sent to the pharmacy as of right now. Pt is requesting a callback regarding this.   Patient called back herself since she has yet to be updated on this matter, and stated she requested a cb. She is upset since the med has not been ordered, she was just seen on 08/15/2024 and she hasn't had a follow-up about her med being sent to her pharmacy and would need this medication as soon as possible. Please order, and send Buspar .  In addition pt requesting and order of her previous vitamin d  supplements that she was taking.

## 2024-08-16 NOTE — Telephone Encounter (Unsigned)
 Copied from CRM [id number]. Topic: Clinical - Medication Question >> Aug 16, 2024 11:15 AM Ahlexyia S wrote: Reason for CRM: Pt called in requesting update on medication Buspar . Pt was advised to start this medication yesterday at her OV. Per pt chart it has not been sent to the pharmacy as of right now. Pt is requesting a callback regarding this.

## 2024-08-17 ENCOUNTER — Ambulatory Visit: Payer: Self-pay | Admitting: Neurology

## 2024-08-17 DIAGNOSIS — R471 Dysarthria and anarthria: Secondary | ICD-10-CM

## 2024-08-17 DIAGNOSIS — M21372 Foot drop, left foot: Secondary | ICD-10-CM

## 2024-08-17 DIAGNOSIS — R251 Tremor, unspecified: Secondary | ICD-10-CM

## 2024-08-17 MED ORDER — BUSPIRONE HCL 15 MG PO TABS: 15.0000 mg | ORAL_TABLET | Freq: Two times a day (BID) | ORAL | 5 refills | Status: AC

## 2024-08-17 NOTE — Addendum Note (Signed)
 Addended by: JODIE GAMMONS on: 08/17/2024 12:34 PM   Modules accepted: Orders

## 2024-08-20 ENCOUNTER — Encounter: Payer: Self-pay | Admitting: Family Medicine

## 2024-08-21 ENCOUNTER — Ambulatory Visit: Payer: Self-pay | Admitting: Family Medicine

## 2024-08-21 MED ORDER — VITAMIN D (ERGOCALCIFEROL) 1.25 MG (50000 UNIT) PO CAPS: 50000.0000 [IU] | ORAL_CAPSULE | ORAL | 0 refills | Status: AC

## 2024-08-21 NOTE — Progress Notes (Signed)
 See mychart note; statin intolerant The 10-year ASCVD risk score (Arnett DK, et al., 2019) is: 7.5%   Values used to calculate the score:     Age: 64 years     Clinically relevant sex: Female     Is Non-Hispanic African American: No     Diabetic: No     Tobacco smoker: No     Systolic Blood Pressure: 130 mmHg     Is BP treated: Yes     HDL Cholesterol: 69.5 mg/dL     Total Cholesterol: 231 mg/dL  Dear [name], Your lab results look good overall! Your anemia is much improved, your BUN is normal and your creatinine has also improved.  Your thyroid  is normal as is your Vitamin B12. Your vitamin D  level is very low; please start taking 4000 units daily (this is over the counter). Your cholesterol is borderline and we will follow this for now.  I hope the buspar  is helping you feel better.  See you again soon. Sincerely, Dr. Jodie

## 2024-08-24 ENCOUNTER — Telehealth: Payer: Self-pay

## 2024-08-24 DIAGNOSIS — R269 Unspecified abnormalities of gait and mobility: Secondary | ICD-10-CM

## 2024-08-24 NOTE — Telephone Encounter (Signed)
 Copied from CRM [id number]. Topic: Clinical - Home Health Verbal Orders >> Aug 24, 2024 11:31 AM Drema MATSU wrote: Caller/Agency: [facility] Callback Number: [phone]  Service Requested: Occupational Therapy Frequency: evaluation only Any new concerns about the patient? Yes pt heart rate was 111 and then went down to 90  Please review and advise orders.

## 2024-08-27 NOTE — Addendum Note (Signed)
 Addended by: NEYSA CLARIA SAILOR on: 08/27/2024 11:22 AM   Modules accepted: Orders

## 2024-08-27 NOTE — Telephone Encounter (Signed)
 Referral placed

## 2024-08-28 ENCOUNTER — Other Ambulatory Visit: Payer: Self-pay

## 2024-08-28 DIAGNOSIS — R269 Unspecified abnormalities of gait and mobility: Secondary | ICD-10-CM

## 2024-08-30 LAB — COMPREHENSIVE METABOLIC PANEL WITH GFR
ALT: 29 IU/L (ref 0–32)
AST: 21 IU/L (ref 0–40)
Albumin: 4.4 g/dL (ref 3.9–4.9)
Alkaline Phosphatase: 147 IU/L — ABNORMAL HIGH (ref 49–135)
BUN/Creatinine Ratio: 18 (ref 12–28)
BUN: 19 mg/dL (ref 8–27)
Bilirubin Total: 0.3 mg/dL (ref 0.0–1.2)
CO2: 22 mmol/L (ref 20–29)
Calcium: 9.9 mg/dL (ref 8.7–10.3)
Chloride: 98 mmol/L (ref 96–106)
Creatinine, Ser: 1.03 mg/dL — ABNORMAL HIGH (ref 0.57–1.00)
Globulin, Total: 3.3 g/dL (ref 1.5–4.5)
Glucose: 100 mg/dL — ABNORMAL HIGH (ref 70–99)
Potassium: 4.1 mmol/L (ref 3.5–5.2)
Sodium: 138 mmol/L (ref 134–144)
Total Protein: 7.7 g/dL (ref 6.0–8.5)
eGFR: 60 mL/min/1.73

## 2024-08-30 LAB — MYASTHENIA GRAVIS PROFILE
AChR Binding Ab, Serum: <0.07 nmol/L (ref 0.00–0.24)
AChR-modulating Ab: 0 % (ref 0–45)
Acetylchol Block Ab: 20 % (ref 0–25)
Anti-striation Abs: NEGATIVE

## 2024-08-30 LAB — CBC WITH DIFFERENTIAL/PLATELET
Basophils Absolute: 0 x10E3/uL (ref 0.0–0.2)
Basos: 0 %
EOS (ABSOLUTE): 0 x10E3/uL (ref 0.0–0.4)
Eos: 0 %
Hematocrit: 36.9 % (ref 34.0–46.6)
Hemoglobin: 11.9 g/dL (ref 11.1–15.9)
Immature Grans (Abs): 0 x10E3/uL (ref 0.0–0.1)
Immature Granulocytes: 0 %
Lymphocytes Absolute: 1.4 x10E3/uL (ref 0.7–3.1)
Lymphs: 18 %
MCH: 30.4 pg (ref 26.6–33.0)
MCHC: 32.2 g/dL (ref 31.5–35.7)
MCV: 94 fL (ref 79–97)
Monocytes Absolute: 0.5 x10E3/uL (ref 0.1–0.9)
Monocytes: 6 %
Neutrophils Absolute: 5.7 x10E3/uL (ref 1.4–7.0)
Neutrophils: 76 %
Platelets: 327 x10E3/uL (ref 150–450)
RBC: 3.92 x10E6/uL (ref 3.77–5.28)
RDW: 12.6 % (ref 11.7–15.4)
WBC: 7.6 x10E3/uL (ref 3.4–10.8)

## 2024-08-30 LAB — MUSK ANTIBODIES: MuSK Antibodies: <1 U/mL

## 2024-09-03 NOTE — Telephone Encounter (Signed)
 I have ordered another [facility] NCV , this time upper extremities .   Speech therapy is ordered, too.

## 2024-09-03 NOTE — Addendum Note (Signed)
 Addended by: CHALICE SAUNAS on: 09/03/2024 07:26 PM   Modules accepted: Orders

## 2024-09-04 ENCOUNTER — Telehealth: Payer: Self-pay | Admitting: Family Medicine

## 2024-09-04 NOTE — Telephone Encounter (Signed)
 Form placed in providers box.

## 2024-09-04 NOTE — Telephone Encounter (Signed)
 [facility] faxed Home Health Certificate (Order [place] [id number]), to be filled out by provider. [facility]  requested to send it back via Fax within ASAP. Document is located in providers tray at front office.Please advise at [phone].

## 2024-09-06 ENCOUNTER — Encounter: Payer: Self-pay | Admitting: Family Medicine

## 2024-09-07 NOTE — Telephone Encounter (Signed)
 Please see pt msg and advise recommendations

## 2024-09-07 NOTE — Telephone Encounter (Signed)
 Please see pt response.Sarah Chung

## 2024-09-13 ENCOUNTER — Other Ambulatory Visit: Payer: Self-pay

## 2024-09-13 DIAGNOSIS — R269 Unspecified abnormalities of gait and mobility: Secondary | ICD-10-CM

## 2024-09-24 ENCOUNTER — Ambulatory Visit: Admitting: Family Medicine

## 2024-10-08 ENCOUNTER — Ambulatory Visit

## 2024-11-14 ENCOUNTER — Encounter: Admitting: Neurology

## 2025-02-25 ENCOUNTER — Ambulatory Visit: Admitting: Adult Health

## 2025-03-15 ENCOUNTER — Other Ambulatory Visit

## 2025-03-22 ENCOUNTER — Ambulatory Visit: Admitting: Oncology

## 2025-08-16 ENCOUNTER — Encounter: Admitting: Family Medicine
